# Patient Record
Sex: Male | Born: 1960 | Race: White | Hispanic: No | Smoking: Current every day smoker
Health system: Southern US, Community
[De-identification: ages and names within clinical notes are randomized; demographics above are authoritative.]

## PROBLEM LIST (undated history)

## (undated) DIAGNOSIS — I471 Supraventricular tachycardia, unspecified: Secondary | ICD-10-CM

## (undated) DIAGNOSIS — G4730 Sleep apnea, unspecified: Secondary | ICD-10-CM

## (undated) DIAGNOSIS — I10 Essential (primary) hypertension: Secondary | ICD-10-CM

## (undated) DIAGNOSIS — C4490 Unspecified malignant neoplasm of skin, unspecified: Secondary | ICD-10-CM

## (undated) DIAGNOSIS — Z9981 Dependence on supplemental oxygen: Secondary | ICD-10-CM

## (undated) DIAGNOSIS — E7800 Pure hypercholesterolemia, unspecified: Secondary | ICD-10-CM

## (undated) DIAGNOSIS — K851 Biliary acute pancreatitis without necrosis or infection: Secondary | ICD-10-CM

## (undated) DIAGNOSIS — B1920 Unspecified viral hepatitis C without hepatic coma: Secondary | ICD-10-CM

## (undated) DIAGNOSIS — K219 Gastro-esophageal reflux disease without esophagitis: Secondary | ICD-10-CM

## (undated) DIAGNOSIS — Z9889 Other specified postprocedural states: Secondary | ICD-10-CM

## (undated) DIAGNOSIS — J449 Chronic obstructive pulmonary disease, unspecified: Secondary | ICD-10-CM

## (undated) HISTORY — DX: Other specified postprocedural states: Z98.890

## (undated) HISTORY — DX: Gastro-esophageal reflux disease without esophagitis: K21.9

## (undated) HISTORY — PX: GALLBLADDER SURGERY: SHX652

## (undated) HISTORY — PX: CHOLECYSTECTOMY: SHX55

## (undated) HISTORY — DX: Unspecified malignant neoplasm of skin, unspecified: C44.90

## (undated) HISTORY — PX: OTHER SURGICAL HISTORY: SHX169

## (undated) HISTORY — DX: Unspecified viral hepatitis C without hepatic coma: B19.20

---

## 1990-09-25 DIAGNOSIS — C4490 Unspecified malignant neoplasm of skin, unspecified: Secondary | ICD-10-CM

## 1990-09-25 HISTORY — DX: Unspecified malignant neoplasm of skin, unspecified: C44.90

## 2005-06-20 ENCOUNTER — Ambulatory Visit: Payer: Self-pay | Admitting: Cardiology

## 2010-12-12 ENCOUNTER — Encounter: Payer: Self-pay | Admitting: Internal Medicine

## 2010-12-12 ENCOUNTER — Other Ambulatory Visit: Payer: Self-pay | Admitting: Gastroenterology

## 2010-12-12 ENCOUNTER — Encounter: Payer: Self-pay | Admitting: Gastroenterology

## 2010-12-12 ENCOUNTER — Ambulatory Visit (INDEPENDENT_AMBULATORY_CARE_PROVIDER_SITE_OTHER): Payer: Self-pay | Admitting: Gastroenterology

## 2010-12-12 ENCOUNTER — Other Ambulatory Visit: Payer: Self-pay | Admitting: Internal Medicine

## 2010-12-12 DIAGNOSIS — B182 Chronic viral hepatitis C: Secondary | ICD-10-CM | POA: Insufficient documentation

## 2010-12-13 LAB — PROTIME-INR: INR: 1.04 (ref <1.50)

## 2010-12-14 LAB — HEPATITIS C RNA QUANTITATIVE: HCV Quantitative: 1260000 IU/mL — ABNORMAL HIGH (ref <43)

## 2010-12-19 ENCOUNTER — Ambulatory Visit (HOSPITAL_COMMUNITY): Payer: Self-pay

## 2010-12-20 NOTE — Progress Notes (Signed)
Referral faxed to the [facility].Marland KitchenMarland Kitchen

## 2010-12-20 NOTE — Telephone Encounter (Signed)
Encounter created in error

## 2010-12-22 NOTE — Assessment & Plan Note (Signed)
Summary: abnormal labs   Vital Signs:  Patient profile:   50 year old male Height:      65 inches Weight:      235 pounds BMI:     39.25 Temp:     98.9 degrees F oral Pulse rate:   72 / minute BP sitting:   110 / 72  (left arm)  Vitals Entered By: Carolan Clines LPN (December 12, 2010 10:55 AM)  Visit Type:  Initial Consult Referring Provider:  Health Dept Primary Care Provider:  Health Dept   History of Present Illness: Mr. Dhanani presents today at the request of the Health Dept secondary to elevated transaminases. Denies abdominal pain, N/V. No loss of appetite. No pruritis or jaundice. Hx of tattoos in remote past. Denies IV drug use. No blood transfusions. Hx of multiple sexual partners in the 84s. +ETOH abuse a few years ago. Denies drinking now.  No [place] of abdomen. Labs listed below:   AST:82 ALT: 148. AP 20, Tbili nl, other LFTs nl. Albumin 4.6, Plt: 129. Hep B antigen negative, Heb B antibody negative, Hep a antibody Neg, Hep C reactive.   Current Medications (verified): 1)  None  Allergies (verified): No Known Drug Allergies  Past History:  Past Medical History: Mild reflux  Past Surgical History: Jaw broken Cholecystectomy Several lipoma removals  Family History: No FH of Colon Cancer: Mother:deceased, emphysema age 42 Father:deceased, DM, CVAs  Social History: Occupation: Theatre manager  Divorced, 3 children, sharing house with ex-girlfriend  Patient currently smokes. 1/2 to 1ppd Alcohol Use - not currently; does have hx of binge drinking on weekends 4-5 years ago.  hx of cocaine use 4-5 months ago Smoking Status:  current  Review of Systems General:  Denies fever, chills, and anorexia. Eyes:  Denies blurring, irritation, and discharge. ENT:  Denies sore throat, hoarseness, and difficulty swallowing. CV:  Denies chest pains and syncope. Resp:  Denies dyspnea at rest and wheezing. GI:  See HPI. GU:  Denies urinary burning, blood in urine, and urinary  frequency. MS:  Denies joint pain / LOM, joint swelling, and joint stiffness. Derm:  Denies rash, itching, and dry skin. Neuro:  Denies weakness and syncope. Psych:  Denies depression and anxiety. Endo:  Denies cold intolerance and heat intolerance.  Physical Exam  General:  Well developed, well nourished, no acute distress.obese.   Head:  Normocephalic and atraumatic. Eyes:  sclera without icterus Mouth:  No deformity or lesions, dentition normal. Lungs:  Clear throughout to auscultation. Heart:  Regular rate and rhythm; no murmurs, rubs,  or bruits. Abdomen:  large AP diameter. Obese. +BS, soft, non-tender, non-distended. Large well-healed open cholecystectomy scar. No palpable HSM.  Msk:  Symmetrical with no gross deformities. Normal posture. Extremities:  No clubbing, cyanosis, edema or deformities noted. Neurologic:  Alert and  oriented x4;  grossly normal neurologically. Skin:  Intact without significant lesions or rashes. Psych:  Alert and cooperative. Normal mood and affect.   Impression & Recommendations:  Problem # 1:  TRANSAMINASES, SERUM, ELEVATED (ICD-790.4) 49-yaer-old Caucasian male with incidental findings of elevated transaminases on routine labwork. Completely asymptomatic. Viral markers completed with +Hep C antibody. Likely culprit of elevated LFTs. Pt is obese and may have component of fatty liver.   [place] of abdomen Hep C quant PCR PT/INR Low-fat diet Avoid ETOH Further rec's to follow  Orders: T-PT (Prothrombin Time) (84696) T-Hepatitis C RNA Quant PCR ([id number]) Consultation Level III (40102)   Orders Added: 1)  T-PT (Prothrombin Time) [  22000] 2)  T-Hepatitis C RNA Quant PCR [[id number]] 3)  Consultation Level III [09811]

## 2010-12-22 NOTE — Letter (Signed)
Summary: Internal Correspondence  Internal Correspondence   Imported By: Hendricks Limes LPN 16/06/9603 54:09:81  _____________________________________________________________________  External Attachment:    Type:   Image     Comment:   External Document

## 2010-12-22 NOTE — Letter (Signed)
Summary: ABD U/S ORDER  ABD U/S ORDER   Imported By: Ave Filter 12/12/2010 12:11:57  _____________________________________________________________________  External Attachment:    Type:   Image     Comment:   External Document

## 2010-12-23 ENCOUNTER — Ambulatory Visit (HOSPITAL_COMMUNITY)
Admission: RE | Admit: 2010-12-23 | Discharge: 2010-12-23 | Disposition: A | Discharge status: Home / Self Care | Payer: Self-pay | Source: Ambulatory Visit | Attending: Internal Medicine | Admitting: Internal Medicine

## 2010-12-23 DIAGNOSIS — R748 Abnormal levels of other serum enzymes: Secondary | ICD-10-CM | POA: Insufficient documentation

## 2010-12-23 DIAGNOSIS — B182 Chronic viral hepatitis C: Secondary | ICD-10-CM | POA: Insufficient documentation

## 2011-02-22 ENCOUNTER — Encounter: Payer: Self-pay | Admitting: Gastroenterology

## 2011-02-23 ENCOUNTER — Ambulatory Visit (INDEPENDENT_AMBULATORY_CARE_PROVIDER_SITE_OTHER): Payer: Self-pay | Admitting: Gastroenterology

## 2011-02-23 VITALS — BP 122/90 | HR 66 | Temp 98.1°F | Ht 64.0 in | Wt 235.0 lb

## 2011-02-23 DIAGNOSIS — B182 Chronic viral hepatitis C: Secondary | ICD-10-CM

## 2011-02-23 LAB — CBC WITH DIFFERENTIAL/PLATELET
Eosinophils Absolute: 0.1 10*3/uL (ref 0.0–0.7)
Eosinophils Relative: 1 % (ref 0–5)
HCT: 47.5 % (ref 39.0–52.0)
Lymphs Abs: 1.6 10*3/uL (ref 0.7–4.0)
MCH: 34.4 pg — ABNORMAL HIGH (ref 26.0–34.0)
MCV: 94.4 fL (ref 78.0–100.0)
Monocytes Absolute: 0.5 10*3/uL (ref 0.1–1.0)
Platelets: 143 10*3/uL — ABNORMAL LOW (ref 150–400)
RBC: 5.03 MIL/uL (ref 4.22–5.81)

## 2011-02-23 LAB — PROTIME-INR
INR: 1.03 (ref <1.50)
Prothrombin Time: 13.7 seconds (ref 11.6–15.2)

## 2011-02-23 LAB — HEPATITIS B SURFACE ANTIGEN: Hepatitis B Surface Ag: NEGATIVE

## 2011-02-24 LAB — IBC PANEL
%SAT: 32 % (ref 20–55)
TIBC: 418 ug/dL (ref 215–435)
UIBC: 286 ug/dL

## 2011-02-24 LAB — COMPREHENSIVE METABOLIC PANEL
Albumin: 4.4 g/dL (ref 3.5–5.2)
BUN: 16 mg/dL (ref 6–23)
CO2: 22 mEq/L (ref 19–32)
Calcium: 9.4 mg/dL (ref 8.4–10.5)
Chloride: 104 mEq/L (ref 96–112)
Creat: 0.83 mg/dL (ref 0.40–1.50)
Glucose, Bld: 76 mg/dL (ref 70–99)

## 2011-02-28 LAB — HEPATITIS C GENOTYPE

## 2011-03-02 NOTE — Progress Notes (Signed)
NAME:  Thomas Ball, Thomas Ball  MR#:  [id number]      DATE:  02/23/2011  DOB:  [birth year]    cc: Primary Care Physician:  [facility] Department of [facility], [address] [place] [address], [place], [place] [address], Fax [phone] Referring Physician:  Gerrit Halls, NP, c/o Roetta Sessions, MD, [facility], [address], [place], [place] [address], Fax [phone]    REASON FOR REFERRAL:  Positive HCV RNA.    history:  The patient is a 50 year old gentleman who I have been asked to see in consultation by Ms. Sams regarding a positive hepatitis C RNA.  According to the patient, there is no history of symptomatic liver disease. [facility] sometime earlier this year, or perhaps late last  year for a physical examination. He was found to have abnormal liver tests, which led to a finding of a positive hepatitis C antibody. He was then referred to Dr. Kendell Bane, where a HCV RNA was done showing a  viral load of 1,260,000 international units/mL. He was not genotyped. There are no symptoms referable to his history of hepatitis C. There are no symptoms to suggest cryoglobulin mediated or decompensated  liver disease.  With respect to risk factors for liver disease, there was significant alcohol use on weekends for many years until he gradually cut down over several months to a few years stopping altogether in October 2011. There is also a history of smoking crack until December 2011, when he stopped altogether. He now attends a church group on a weekly basis for 2 hours, which constitutes counseling. There is no history of intravenous drug use. He has tattoos in the remote past. There is no history of transfusions or unsterile body piercing. There is no family history of liver disease. He has not been immunized against hepatitis A or B.   PAST MEDICAL HISTORY:  Otherwise unremarkable.   PAST SURGERY HISTORY:  Wired jaw for fracture,  cholecystectomy 1980s, lipoma removal.    Past psychiatric history:  Denies.    Current medications:  None.    Allergies:  Denies.   habits:  Smoking 1 pack of cigarettes per day. Alcohol as above.   FAMILY HISTORY:  As above.   SOCIAL HISTORY:  Married with 3 children, accompanied by his wife today. 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   REVIEW OF SYSTEMS:  All 10 systems reviewed today with the patient and they are negative other than which was mentioned above. CES-D was 1.   PHYSICAL EXAMINATION:   Constitutional:  Central obesity without significant bitemporal wasting. Vital signs: Height 64 inches, weight 235 pounds, blood pressure 122/90, pulse 66, temperature 98.1 Fahrenheit. Ears, nose,  mouth and throat:  Unremarkable oropharynx.  No thyromegaly or neck masses.  Chest:  Resonant to percussion.  Clear to auscultation.  Cardiovascular:  Heart sounds normal S1, S2 without murmurs or rubs.   There is no peripheral edema.  Abdominal:  Normal bowel sounds.  No masses or tenderness.  I could not appreciate a liver edge or spleen tip.  I could not appreciate any hernias.  Lymphatics:  No cervical or  inguinal lymphadenopathy.  Central Nervous System:  No asterixis or focal neurologic findings.  Dermatologic:  Anicteric without palmar  erythema or spider angiomata.  Eyes:  Anicteric sclerae.  Pupils are equal and reactive to light.   Laboratories:  Previous lab work from  a clinic note of 12/12/2010, the labs that must have been sent from [facility] indicate an AST of 82, ALT 148, ALP of 20, total bilirubin was normal, albumin 4.6, platelets 129,  hepatitis B surface antigen was negative. Hepatitis B antibody was negative, hepatitis A antibody presumably total hepatitis A antibody was negative, and the hepatitis C antibody was positive. The only labs  I obtained from Dr. Aura Fey office was an HCV RNA of  1,260,000 international units/mL or 6.10 log international units per mL.  Ultrasound done on 12/23/2010, at [facility] showed there was no focal liver masses. The spleen was normal in size. There is no mention of ascites.   Assessment:  The patient is a 50 year old gentleman with a history of genotype unknown HCV. Aside from central obesity, which is not a contraindication to treating him, I do not have any concerns about his  ability to talk to be treated or his ability to respond to therapy. He is now 5 months abstinent from drug use and over 6 months abstinent from alcohol use, and in counseling for this. Overall, I think he is a good candidate be treated, but he would benefit from weight loss.  In my discussion today with the patient and his wife, who accompanied him, we discussed the nature and natural history of hepatitis C. We discussed the need to genotype him. We discussed the role of biopsy  for genotype 1 patients. We discussed treatment with pegylated interferon and ribavirin and for all genotypes, and the addition of a protease inhibitor for genotype 1. I discussed the treatment protocol  and follow up in our clinic. I reviewed the specific system, constitutional, psychiatric side effects of therapy. I reviewed the response rates based on genotype. I also discussed the risk of  contagion.  I then discussed the possibility of participating in clinical trials. I have explained that participation is completely voluntary but requires compliance. I have explained that he may be randomized to  medications that are considered experimental. I have explained that he may receive medications for which we do not know the complete side effect profile or true efficacy. I have also explained that we do not  have an obligation to offer him trials because these are not considered standard of care. He was interested in hearing about any trials should he be eligible.   PLAN:  1. Standard labs including  genotyping. 2. Test for hepatitis A and B immunity. 3. Will not do an IL-28 until he has a genotype. 4. If genotype 1, will send for biopsy and then follow up thereafter. 5. If genotype non-1, will bring for follow up. 6. I will send his information to our research coordinator to see if he is eligible for any trials. 7. Literature on hepatitis C given.            Brooke Dare, MD    ADDENDUM: Genotype 3a  Needs hepatitis A and B vaccination.  403 .[id number]  D:  Thu May 31 13:23:23 2012 ; T:  Thu May 31 15:23:05 2012  Job #:  [id number]

## 2011-04-20 ENCOUNTER — Ambulatory Visit (INDEPENDENT_AMBULATORY_CARE_PROVIDER_SITE_OTHER): Payer: Self-pay | Admitting: Gastroenterology

## 2011-04-20 VITALS — BP 118/82 | HR 76 | Temp 97.7°F | Ht 64.0 in | Wt 241.0 lb

## 2011-04-20 DIAGNOSIS — B182 Chronic viral hepatitis C: Secondary | ICD-10-CM

## 2011-04-27 NOTE — Progress Notes (Signed)
NAME:  Thomas Ball, Thomas Ball  MR#:  [id number]      DATE:  04/20/2011  DOB:  05-08-1961    cc: Primary Care Physician:  [facility] Department of [facility], [address] [place] [address], [place], [place] [address], Fax [phone] Referring Physician:  Gerrit Halls, NP c/o Roetta Sessions, MD, [facility], [address], [place], [place] [address], Fax [phone]    REASON FOR VISIT:  Followup of genotype 3a hepatitis C.    history:  The patient returns today unaccompanied. Since last being seen on 02/23/2011, we characterized his virus by genotyping him. He has gained 6 pounds since last time he was seen. There are no symptoms to  suggest decompensated or cryoglobulin mediated disease nor are there symptoms referable directly to his hepatitis C.  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 I suggested to him that perhaps this would lead to  overeating considering he is not eating at regular intervals. I also tried to elicit a history of how much carbohydrate he was taking in his diet.   PAST MEDICAL HISTORY:  No diabetes, dyslipidemia, hypertension, or coronary artery disease.   CURRENT MEDICATIONS:  None.    Allergies:  Denies.    Habits:  Smoking, approximately half a pack to 1 pack of cigarettes per day. Alcohol denies interval consumption.   REVIEW OF SYSTEMS:  All 10 systems reviewed today with the patient and they are negative other than which is mentioned above. His CES-D was zero.   PHYSICAL EXAMINATION:  Constitutional: Well appearing, but he does have central obesity. Vital signs: Height 64 inches, weight 241 pounds, blood pressure 118/82, pulse 76, temperature 97.7 Fahrenheit.   Laboratories:  Relevant labs from 02/23/2011, his platelet count was 143. INR was 1.03, AST 78 ALT 118, ALP 19, total bilirubin 1.1 albumin 4.4.  Hepatitis B surface antigen and antibody  were negative, as was total A antibody. ANA was negative. Genotype 3a.   ASSESSMENT:  The patient is a 50 year old gentleman with history of genotype 3a hepatitis C. I do not see any contraindication to treating him other than his obesity. I know his ALP has been low in the past and his  ceruloplasmin should be done at the time of his next lab draws to be careful. His BMI of 41.4 today does not bode well for a response to treatment, and I think that would be worth increasing his dose of  ribavirin above the traditionally 800 mg daily dose.  In my discussion today with the patient, we discussed the results of the lab testing including genotyping and its significance. We discussed the importance of at least 6-10 pounds of weight loss as he  goes through the process of gearing up to be treated for his hepatitis C, which he remains interested in. We discussed treatment duration,  our protocol for treatment in our clinic for genotype 3 patients. We discussed the response rates. He remains interested in being treated.   PLAN:  1. Will need to do a ceruloplasmin at his subsequent visit. 2. He is hepatitis A and B naive and will need to be vaccinated through the [facility]. 3. I have given him a copy of the [place] Access to Care application to complete and return. Should he qualify for medications, the medications will be shipped to his house at which point he will contact us for  the first appointment for teaching, at which time we can draw his ceruloplasmin and viral load. 4. Follow up will be dependent on when all the paperwork is done, which could take at least a month.            Brooke Dare, MD   [phone]  D:  Thu Jul 26 17:40:30 2012 ; T:  Thu Jul 26 20:11:39 2012  Job #:  [id number]

## 2011-05-25 ENCOUNTER — Other Ambulatory Visit: Payer: Self-pay | Admitting: Gastroenterology

## 2011-05-25 ENCOUNTER — Ambulatory Visit (INDEPENDENT_AMBULATORY_CARE_PROVIDER_SITE_OTHER): Payer: Self-pay | Admitting: Gastroenterology

## 2011-05-25 VITALS — BP 142/91 | HR 74 | Temp 98.1°F | Ht 64.0 in | Wt 241.0 lb

## 2011-05-25 DIAGNOSIS — B182 Chronic viral hepatitis C: Secondary | ICD-10-CM

## 2011-05-30 LAB — HCV RNA QUANT RFLX ULTRA OR GENOTYP
HCV Quantitative Log: 6.14 {Log} — ABNORMAL HIGH (ref <1.63)
HCV Quantitative: 1370000 IU/mL — ABNORMAL HIGH (ref <43)

## 2011-06-08 ENCOUNTER — Ambulatory Visit (INDEPENDENT_AMBULATORY_CARE_PROVIDER_SITE_OTHER): Payer: Self-pay | Admitting: Gastroenterology

## 2011-06-08 VITALS — BP 140/83 | HR 76 | Temp 97.7°F | Ht 64.0 in | Wt 244.0 lb

## 2011-06-08 DIAGNOSIS — B182 Chronic viral hepatitis C: Secondary | ICD-10-CM

## 2011-06-08 LAB — CBC WITH DIFFERENTIAL/PLATELET
Eosinophils Relative: 3 % (ref 0–5)
Hemoglobin: 14.9 g/dL (ref 13.0–17.0)
Lymphocytes Relative: 42 % (ref 12–46)
Lymphs Abs: 1.1 10*3/uL (ref 0.7–4.0)
MCH: 33.6 pg (ref 26.0–34.0)
MCV: 98.2 fL (ref 78.0–100.0)
Monocytes Relative: 14 % — ABNORMAL HIGH (ref 3–12)
Neutrophils Relative %: 41 % — ABNORMAL LOW (ref 43–77)
Platelets: 114 10*3/uL — ABNORMAL LOW (ref 150–400)
RBC: 4.43 MIL/uL (ref 4.22–5.81)
WBC: 2.7 10*3/uL — ABNORMAL LOW (ref 4.0–10.5)

## 2011-06-08 NOTE — Progress Notes (Signed)
   NAME:  Thomas Ball, Thomas Ball  MR#:  [id number]      DATE:  05/25/2011  DOB:  1961/03/17    cc: Primary Care Physician:  [facility] Department of [facility], [address] [place] [address], [place], [place] [address], Fax [phone] Referring Physician:  Gerrit Halls, NP, c/o Roetta Sessions, MD, [facility], [address] [place], [place] [address], Fax [phone]    REASON FOR VISIT:  Genotype 3A hepatitis C for teaching.    History:  The patient returns today with his significant other. He has no symptoms referable to his history of hepatitis C nor are there symptoms to suggest cryoglobulin mediated or decompensated liver  disease. He has his medications in hand, and seeks teaching on the administration thereof.   PAST MEDICAL HISTORY:  No interval change.   CURRENT MEDICATIONS:  None at this time, but will start on Pegasys 180 mcg subcu weekly starting tomorrow and ribavirin 400 mg p.o. b.i.d. starting tomorrow.  I was unable to give a higher dose as he is on [place] Access To Care and this is the dose of the ribavirin they will dispense.   Allergies:  Denies.   HABITS:  Smoking, approximately half-pack to 1 pack cigarettes per day. Alcohol denies interval consumption.   REVIEW OF SYSTEMS:  All 10 systems reviewed today with the patient and they are negative other than which was mentioned above. CES-D was zero.   PHYSICAL EXAMINATION:   Constitutional:  Otherwise well. Vital signs: Height 64 inches, weight 241 pounds, blood pressure 142/91, pulse 74, temperature 98.1 Fahrenheit.   ASSESSMENT:  The patient is a 50 year old gentleman with history of genotype 3A hepatitis C. He is due to commence on treatment tomorrow, 05/26/2011.  Today, I demonstrated how to do the injection of Pegasys with the prefilled syringes. I also instructed him on how to use the ribavirin.  His significant other has the experience of using insulin injections and will assist him with all this.   PLAN:    1. I forgot to do the ceruloplasmin today but did drawn HCV RNA as his baseline. 2. He is hepatitis A and B naive and will need to be vaccinated through the [facility]. 3. His start date for treatment will be 05/26/2011. 4. Will return in 2 weeks' time for his first on treatment visit for CBC and I will draw the ceruloplasmin at that time.            Thomas Dare, MD   ADDENDUM  Pretreatment viral load 1370000 IU/mL.    403 .[id number]  D:  Thu Aug 30 18:42:58 2012 ; T:  Thu Aug 30 21:50:59 2012  Job #:  [id number]

## 2011-06-09 LAB — CERULOPLASMIN: Ceruloplasmin: 25 mg/dL (ref 20–60)

## 2011-06-22 ENCOUNTER — Ambulatory Visit (INDEPENDENT_AMBULATORY_CARE_PROVIDER_SITE_OTHER): Payer: Self-pay | Admitting: Gastroenterology

## 2011-06-22 DIAGNOSIS — B182 Chronic viral hepatitis C: Secondary | ICD-10-CM

## 2011-06-22 LAB — HEPATIC FUNCTION PANEL
Albumin: 3.9 g/dL (ref 3.5–5.2)
Bilirubin, Direct: 0.4 mg/dL — ABNORMAL HIGH (ref 0.0–0.3)
Total Bilirubin: 1.6 mg/dL — ABNORMAL HIGH (ref 0.3–1.2)

## 2011-06-22 LAB — TSH: TSH: 0.796 u[IU]/mL (ref 0.350–4.500)

## 2011-06-22 NOTE — Progress Notes (Signed)
   Thomas Ball    MR#:  [id number]      DATE:  06/08/2011  DOB:  05/27/1961    cc: Primary Care Physician:  [facility] Department of [facility], [address] [place] [address], [place], [place] [address], Fax [phone] Referring Physician:  Gerrit Halls, NP c/o [name], MD, [facility], [address] [place], [place] [address], Fax [phone]    REASON FOR VISIT:  Follow up of genotype 3a HCV, week 2 of therapy.    HISTORY:  The patient returns today unaccompanied. His start date for ribavirin for his 3A hepatitis C was on 05/25/2011. Today will be his third injection in switching injections from Thursday to Friday. He is  having some irritability in that he has had some arguments with his girlfriend, but thinks that this is manageable and does not want pharmacotherapy. He had a bout of bronchitis for the first week of  treatment for which he went to the [facility] and was given a course of antibiotics, the name of which he cannot recall, and he did not bring them with him today. He feels sick within  24 hours of taking the injections and that lasts for another 24 hours before it resolves. He is still able work. There is no skin rash.   PAST MEDICAL HISTORY:  No interval change.   CURRENT MEDICATIONS:  Ribavirin 400 mg p.o. b.i.d., Pegasys 180 mcg subcu q. weekly.    Allergies:  Denies.   HABITS:  Smoking, smokes 1/2 pack to 1 pack of cigarettes per day. Alcohol, denies interval consumption.   REVIEW OF SYSTEMS:  All 10 systems reviewed today with the patient and they are negative other than which was mentioned above. His CES-D was 2.   PHYSICAL EXAMINATION:   Constitutional:  Central obesity, but otherwise well. Vital signs: Height 64 inches, weight 244 pounds up 3 pounds from previously. Blood pressure 140/83, pulse of 76 with temperature of 97.7 Fahrenheit.  Chest:  Resonant to percussion and auscultation.   ASSESSMENT:  The patient is a  50 year old gentleman with history of genotype 3A hepatitis C. He is currently at week 2 of treatment with a start date of 05/25/2011.  In my discussion today with the patient, we discussed his progress to date. I reviewed the side effects with him. Because he thinks he missed maybe a dose of ribavirin at least once in the evening, I have  given him a Pegasys kit with the dosing pill box. We discussed getting a CBC today, and the importance of returning in 2 weeks' time for a week 4 HCV RNA. He declined any pharmacotherapy for his irritability.   plan:  1. CBC today. 2. Will draw ceruloplasmin. 3. He is hepatitis A and B naive, and will need to be a vaccinated at [facility]. 4. I have given him a Pegasys dosing kit. 5. He is to return in 2 weeks' time.            Thomas Dare, MD   ADDENDUM: Ceruloplasmin normal.  403 .20947  D:  Thu Sep 13 11:44:06 2012 ; T:  Thu Sep 13 13:14:12 2012  Job #:  [id number]

## 2011-06-23 LAB — CBC WITH DIFFERENTIAL/PLATELET
Basophils Relative: 0 % (ref 0–1)
HCT: 42.7 % (ref 39.0–52.0)
Hemoglobin: 14.5 g/dL (ref 13.0–17.0)
Lymphocytes Relative: 37 % (ref 12–46)
Lymphs Abs: 1 10*3/uL (ref 0.7–4.0)
Monocytes Absolute: 0.3 10*3/uL (ref 0.1–1.0)
Monocytes Relative: 10 % (ref 3–12)
Neutro Abs: 1.4 10*3/uL — ABNORMAL LOW (ref 1.7–7.7)
Neutrophils Relative %: 51 % (ref 43–77)
RBC: 4.43 MIL/uL (ref 4.22–5.81)
WBC: 2.8 10*3/uL — ABNORMAL LOW (ref 4.0–10.5)

## 2011-06-26 LAB — HEPATITIS C RNA QUANTITATIVE: HCV Quantitative Log: <1.63 {Log} (ref <1.63)

## 2011-06-29 NOTE — Progress Notes (Addendum)
   Thomas Ball, Thomas Ball    MR#:  [id number]      DATE:  06/22/2011  DOB:  09-22-1961    cc:  Primary Care Physician: [facility] Department of [facility], [address] [place] [address], [place], [place] [address], Fax [phone]   Referring Physician: Gerrit Halls, NP c/o [name], MD, [facility], [address] [place], [place] [address], Fax [phone]    REASON FOR VISIT:  Followup genotype 3A HCV week 4 of therapy.    HISTORY:  The patient returns today unaccompanied. His start date for ribavirin and pegylated interferon for his genotype 3A hepatitis C was on 05/25/2011. He will take his fifth injection tomorrow. The  irritability is better. He complains of fatigue. There is no significant skin rash. He reports that he had a recent respiratory tract infection with a cough and some congestion.   PAST MEDICAL HISTORY:  No interval change.   CURRENT MEDICATIONS:  Ribavirin 400 mg p.o. b.i.d., Pegasys 180 mcg subcu weekly.    Allergies:  Denies.   HABITS:  Smoking, half-pack to 1 pack cigarettes per day. Alcohol, denies interval consumption.   REVIEW OF SYSTEMS:  He complains of a stye over his right eyelid.  All 10 systems reviewed today with the patient and they are negative other than which was mentioned above.  His CES-d was 4.   PHYSICAL EXAMINATION:   Constitutional:  Central obesity, otherwise well. Vital signs: Height 64 inches, weight 241 pounds, down 3 pounds from previously. Blood pressure 122/78, pulse 94, temperature 98.6.   ASSESSMENT:  The patient is a 50 year old gentleman with history of genotype 3A hepatitis C. He is currently week 4 of treatment with the start date 05/25/2011. Today we will check his HCV RNA to determine his week 4  response.  In my discussion today with the patient, I have discussed the importance of today's lab work. I have also reinforced the need to stop smoking.   I have explained to the patient that they stye over his right eyelid  has nothing to do with his treatment of hepatitis C or his hepatitis C  itself, and he should be careful not to manipulate it to prevent infection.   plan:  1. CBC today. 2. Hepatitis A and B naive. He will need to be vaccinated through [facility]. 3. To return in 2 weeks' time for his week 6 visit.            Brooke Dare, MD   ADDENDUM HCV RNA is detected but not quantifiable   403 .20947  D:  Thu Sep 27 13:34:07 2012 ; T:  Thu Sep 27 14:02:51 2012  Job #:  [id number]

## 2011-07-06 ENCOUNTER — Other Ambulatory Visit: Payer: Self-pay | Admitting: Gastroenterology

## 2011-07-06 ENCOUNTER — Ambulatory Visit (INDEPENDENT_AMBULATORY_CARE_PROVIDER_SITE_OTHER): Payer: Self-pay | Admitting: Gastroenterology

## 2011-07-06 DIAGNOSIS — B182 Chronic viral hepatitis C: Secondary | ICD-10-CM

## 2011-07-07 LAB — CBC WITH DIFFERENTIAL/PLATELET
Eosinophils Relative: 1 % (ref 0–5)
HCT: 40 % (ref 39.0–52.0)
Hemoglobin: 13.1 g/dL (ref 13.0–17.0)
Lymphocytes Relative: 42 % (ref 12–46)
Lymphs Abs: 1.3 10*3/uL (ref 0.7–4.0)
MCV: 100.5 fL — ABNORMAL HIGH (ref 78.0–100.0)
Monocytes Relative: 10 % (ref 3–12)
Platelets: 114 10*3/uL — ABNORMAL LOW (ref 150–400)
RBC: 3.98 MIL/uL — ABNORMAL LOW (ref 4.22–5.81)
WBC: 3 10*3/uL — ABNORMAL LOW (ref 4.0–10.5)

## 2011-07-14 NOTE — Progress Notes (Signed)
   NAME:  Thomas Ball, Thomas Ball  MR#:  [id number]      DATE:  07/06/2011  DOB:  Aug 14, 1961    cc:  Primary Care Physician: [facility] Department of [facility], [address] [place] [address], [place], [place] [address], Fax [phone]   Referring Physician: Gerrit Halls, NP c/o [name], MD, [facility], [address], [place], [place] [address], Fax [phone]     Reason for visit:  Followup genotype 3a HCV on week 6 of therapy.    History:  The patient returns today comes unaccompanied. His start date for ribavirin and pegylated interferon for genotype 3a hepatitis C was on  05/25/2011. He will take his seventh injection tomorrow. He is tolerating therapy well, but does complain of some fatigue.   PAST MEDICAL HISTORY:  No interval change.   CURRENT MEDICATIONS:  Ribavirin 400 mg p.o. b.i.d., Pegasys 180 mcg subcu weekly.   ALLERGIES:  Denies.   HABITS:  Smoking half pack to 1 pack of cigarettes per day. Alcohol denies interval consumption.   REVIEW OF SYSTEMS:  All 10 systems reviewed today with the patient and they are negative other which is mentioned above. CES-D was 6.   PHYSICAL EXAMINATION:   Constitutional:  Central obesity. Otherwise appeared well. Vital signs: Height 64 inches, weight 246 pounds up 5 pounds from  previously. Blood pressure 137/90, pulse 74, temperature 97.8 Fahrenheit.   ASSESSMENT:  The patient is a 50 year old gentleman with history of genotype 3a hepatitis C. He is currently completing week 6 of therapy with start date on 05/25/2011. His HCV RNA at week 4 was detectable but not  quantifiable. This suggests to me that he should be continued on therapy at least for 6 months but perhaps one can consider extending therapy to perhaps 36 weeks since the last negative HCV RNA. To that  end, I will check an HCV RNA today.  Today, I discussed the results of lab testing with the patient and explained the implications of his positive but unquantifiable HCV  RNA.  We discussed the possibility of him needing to continue on therapy a little bit longer than the minimal of 6 months.   PLAN:  1. CBC today. 2. Will check an HCV RNA today for his week 6 RNA. 3. He is to follow up in 2 weeks' time for his week 8 visit. 4. He is hepatitis A and B naive and will need to be vaccinated to [facility]. 5. It may be that through the indigent care program with [facility] they will not authorize more than 6 months of therapy, however which may not have a material impact on his risk of relapse.            Brooke Dare, MD   ADDENDUM HCV RNA undetectable at week 6.  403 .[id number]  D:  Thu Oct 11 16:29:01 2012 ; T:  Thu Oct 11 16:56:13 2012  Job #:  [id number]

## 2011-07-20 ENCOUNTER — Ambulatory Visit (INDEPENDENT_AMBULATORY_CARE_PROVIDER_SITE_OTHER): Payer: Self-pay | Admitting: Gastroenterology

## 2011-07-20 DIAGNOSIS — B182 Chronic viral hepatitis C: Secondary | ICD-10-CM

## 2011-07-20 LAB — HEPATIC FUNCTION PANEL
AST: 43 U/L — ABNORMAL HIGH (ref 0–37)
Bilirubin, Direct: 0.3 mg/dL (ref 0.0–0.3)
Indirect Bilirubin: 0.8 mg/dL (ref 0.0–0.9)
Total Bilirubin: 1.1 mg/dL (ref 0.3–1.2)

## 2011-07-20 LAB — CBC WITH DIFFERENTIAL/PLATELET
Eosinophils Absolute: 0 10*3/uL (ref 0.0–0.7)
Eosinophils Relative: 1 % (ref 0–5)
HCT: 39 % (ref 39.0–52.0)
Lymphocytes Relative: 40 % (ref 12–46)
Lymphs Abs: 1.1 10*3/uL (ref 0.7–4.0)
MCH: 33.3 pg (ref 26.0–34.0)
MCV: 104 fL — ABNORMAL HIGH (ref 78.0–100.0)
Monocytes Absolute: 0.3 10*3/uL (ref 0.1–1.0)
Monocytes Relative: 12 % (ref 3–12)
Platelets: 127 10*3/uL — ABNORMAL LOW (ref 150–400)
RBC: 3.75 MIL/uL — ABNORMAL LOW (ref 4.22–5.81)
WBC: 2.7 10*3/uL — ABNORMAL LOW (ref 4.0–10.5)

## 2011-07-27 NOTE — Progress Notes (Signed)
   NAME:  Thomas Ball, Thomas Ball  MR#:  [id number]      DATE:  07/20/2011  DOB:  1960-10-03    cc: Primary Care Physician:  [facility] Department [facility], [address] [place] [address] [place], [place] [address], Fax [phone] Referring Physician:  Gerrit Halls, NP c/o [name], MD. [facility], [address], [place], [place] [address], Fax [phone]    Reason for visit:  Followup genotype 3A HCV on week 8 of therapy.    History:  Patient returns today unaccompanied. His start date for pegylated interferon and ribavirin for genotype 3A HCV was on 05/25/2011,  putting him at approximately week 8 of treatment. He is tolerating therapy well, other than some fatigue.   Past medical history:  No interval change.   MEDICATIONS:  Ribavirin 400 mg p.o. b.i.d., Pegasys 180 mcg subcu weekly.    Allergies:  Denies.   HABITS:  Smoking half pack of cigarettes per day. No interval alcohol consumption.   SYSTEM REVIEW:   OF SYSTEMS:  All 10 systems reviewed today with the patient and they are negative other than which is mentioned above. CES-D was 4.   PHYSICAL EXAMINATION:   Constitutional:  Central obesity but otherwise well. Vital signs: Height 64 inches, weight 246 pounds unchanged from previously, blood pressure 130/91, pulse of 81, temperature 98.1 Fahrenheit.    Laboratories:  His week 6 HCV RNA was undetectable.   ASSESSMENT:  The patient is a 50 year old gentleman with a history genotype 3A hepatitis C. He has currently completed week 8 of therapy, which started on 05/25/2011. His HCV RNA at week 4 was detectable but not  quantifiable and his week 6 HCV RNA was undetectable. This suggests that I should extend his therapy to perhaps 36 from a planned 24 weeks because of the slow virological response and the lack of a complete  rapid virological response at week 4.  In my discussion today with the patient, we discussed the results of previous lab testing and its implications.  We discussed possibly extending therapy to 48 weeks. I told him this was contingent on his  ability to tolerate therapy, as well as approval from [place] who is supplying medications at no cost to the patient.  I also reassured him that if we are unable to do this, other drugs in the future will become available that would be much more powerful than  the current available therapies and he perhaps will be eligible for those treatments.   plan:  1. CBC today. 2. He is hepatitis A and B naive and needs to be vaccinated through his [facility]. 3. He is to return in 4 weeks' time for his week 12 visit.            Brooke Dare, MD   ADDENDUM: I am copying this note to [facility] to let them know that I intend to try and extend therapy to 48 weeks, and I had originally asked for 48 weeks of therapy on the initial prescription form.  403 .[id number]  D:  Thu Oct 25 15:57:54 2012 ; T:  Thu Oct 25 16:50:19 2012  Job #:  [id number]

## 2011-08-10 ENCOUNTER — Ambulatory Visit: Payer: Self-pay | Admitting: Gastroenterology

## 2011-08-24 ENCOUNTER — Ambulatory Visit (INDEPENDENT_AMBULATORY_CARE_PROVIDER_SITE_OTHER): Payer: Self-pay | Admitting: Gastroenterology

## 2011-08-24 DIAGNOSIS — B182 Chronic viral hepatitis C: Secondary | ICD-10-CM

## 2011-08-31 NOTE — Progress Notes (Signed)
   NAME:  Thomas Ball, Thomas Ball  MR#:  [id number]      DATE:  08/24/2011  DOB:  27-Jan-1961    cc: Primary Care Physician:  [facility] Department of [facility], [address] [place] [address], [place], [place] [address], Fax [phone] Referring Physician:  Gerrit Halls, NP c/o [name], MD, [facility], [address], [place], [place] [address], Fax [phone]    REASON FOR VISIT:  Followup genotype 3a HCV, week 13 of therapy.   History:  Patient returns today unaccompanied. Her start date for pegylated interferon and ribavirin for genotype 3 HCV was on 05/25/2011. He is  taking his fourteenth injection of Pegasys tomorrow. He is tolerating therapy well without any complaints.   PAST MEDICAL HISTORY:  No interval change.   CURRENT MEDICATIONS:  Ribavirin 400 mg p.o. b.i.d., Pegasys 180 mcg subcu weekly.   ALLERGIES:  Denies.   HABITS:  Still smoking half pack of cigarettes per day. Alcohol, no interval consumption.   REVIEW OF SYSTEMS:  All 10 systems reviewed today with the patient and they are negative other than which is mentioned above. His CES-D was 2.   PHYSICAL EXAMINATION:   Constitutional:  Central obesity but otherwise well. Vital signs: Height 64 inches, weight 243 pounds down 3 pounds from previously. Blood pressure 137/96, pulse 76, temperature 98 Fahrenheit.   ASSESSMENT:  The patient is a 50 year old gentleman with a history of genotype 3a HCV. He is currently completing week 13 of therapy with start on 05/25/2011, and takes his fourteenth injection of Pegasys tomorrow.  His HCV RNA at week 4 was detectable but not quantifiable. His week 6 HCV was undetectable. I have faxed [facility] the possibility of extending his access to care from 24 to at least 42 weeks because of a slow viral response, in addition to the fact that he has central obesity. I have not received any news to the contrary suggesting I can continue with treatment and they will cover his  medications.  In my discussion today with the patient, we discussed the implications of his viral kinetics and extending therapy to at least week 42, possibly 48. He states he is willing to continue, as he is not having  much in the way of side effects.  We also discussed the importance of smoking cessation.   PLAN:  1. CBC today. In addition to TSH and liver enzymes. 2. He is hepatitis A and B naive, and I have instructed him to contact the [facility] Department of [facility] for hepatitis A and B vaccinations. This has not occurred to date. 3. To return approximately 4 weeks' time.            Brooke Dare, MD   ADDENDUM  No record of labs being done on our account.  403 .[id number]  D:  Thu Nov 29 18:30:52 2012 ; T:  Thu Nov 29 19:41:57 2012  Job #:  [id number]

## 2011-09-21 ENCOUNTER — Ambulatory Visit: Payer: Self-pay | Admitting: Gastroenterology

## 2011-09-21 ENCOUNTER — Telehealth: Payer: Self-pay | Admitting: Gastroenterology

## 2011-09-21 NOTE — Telephone Encounter (Signed)
Thomas Ball called to report that he lost his [facility] assistance but will obtain a renewal on Sep 27, 2011.   I asked that he get the labs done that he did not get at the last visit and be rescheduled for a month from now.

## 2011-10-07 LAB — TSH: TSH: 1.627 u[IU]/mL (ref 0.350–4.500)

## 2011-10-07 LAB — CBC WITH DIFFERENTIAL/PLATELET
Basophils Absolute: 0 10*3/uL (ref 0.0–0.1)
Basophils Relative: 0 % (ref 0–1)
Eosinophils Absolute: 0 10*3/uL (ref 0.0–0.7)
Hemoglobin: 13 g/dL (ref 13.0–17.0)
MCH: 34.1 pg — ABNORMAL HIGH (ref 26.0–34.0)
MCHC: 30.6 g/dL (ref 30.0–36.0)
Monocytes Relative: 11 % (ref 3–12)
Neutro Abs: 1.3 10*3/uL — ABNORMAL LOW (ref 1.7–7.7)
Neutrophils Relative %: 51 % (ref 43–77)
RDW: 15.9 % — ABNORMAL HIGH (ref 11.5–15.5)

## 2011-10-07 LAB — HEPATIC FUNCTION PANEL
ALT: 50 U/L (ref 0–53)
AST: 40 U/L — ABNORMAL HIGH (ref 0–37)
Albumin: 4.3 g/dL (ref 3.5–5.2)
Alkaline Phosphatase: 14 U/L — ABNORMAL LOW (ref 39–117)
Indirect Bilirubin: 0.7 mg/dL (ref 0.0–0.9)
Total Protein: 7.3 g/dL (ref 6.0–8.3)

## 2011-10-19 ENCOUNTER — Ambulatory Visit (INDEPENDENT_AMBULATORY_CARE_PROVIDER_SITE_OTHER): Payer: Self-pay | Admitting: Gastroenterology

## 2011-10-19 DIAGNOSIS — B182 Chronic viral hepatitis C: Secondary | ICD-10-CM

## 2011-10-19 LAB — CBC WITH DIFFERENTIAL/PLATELET
Basophils Absolute: 0 10*3/uL (ref 0.0–0.1)
Basophils Relative: 1 % (ref 0–1)
Hemoglobin: 12.6 g/dL — ABNORMAL LOW (ref 13.0–17.0)
Lymphocytes Relative: 34 % (ref 12–46)
MCHC: 31.8 g/dL (ref 30.0–36.0)
Neutro Abs: 1.2 10*3/uL — ABNORMAL LOW (ref 1.7–7.7)
Neutrophils Relative %: 53 % (ref 43–77)
RDW: 15.3 % (ref 11.5–15.5)
WBC: 2.2 10*3/uL — ABNORMAL LOW (ref 4.0–10.5)

## 2011-10-19 LAB — HEPATIC FUNCTION PANEL
AST: 44 U/L — ABNORMAL HIGH (ref 0–37)
Albumin: 3.8 g/dL (ref 3.5–5.2)
Alkaline Phosphatase: 16 U/L — ABNORMAL LOW (ref 39–117)
Total Bilirubin: 0.9 mg/dL (ref 0.3–1.2)
Total Protein: 6.7 g/dL (ref 6.0–8.3)

## 2011-10-19 NOTE — Progress Notes (Signed)
NAME: Thomas Ball, Thomas Ball  MR#: [id number]      DATE: 08/24/2011  DOB: 05-08-1961    cc:  Primary Care Physician: [facility] Department of [facility], [address] [place] [address], [place], [place] [address], Fax [phone]  Referring Physician: Gerrit Halls, NP c/o [name], MD, [facility], [address], [place], [place] [address], Fax [phone]    REASON FOR VISIT:  Followup genotype 3a HCV, week 20 of therapy.   HISTORY:  Patient returns today unaccompanied. His start date for pegylated interferon and ribavirin for genotype 3 HCV was on 05/25/2011. He is  taking his 21st injection of Pegasys tomorrow. He is tolerating therapy well without any complaints.  He was contacted by [facility] for meds.  They have offered to ship more meds so it appears that he can continue treatment beyond 24 weeks.  PAST MEDICAL HISTORY:  No interval change.   CURRENT MEDICATIONS:  Ribavirin 400 mg p.o. b.i.d., Pegasys 180 mcg subcu weekly.   ALLERGIES:  Denies.   HABITS:  Still smoking half pack of cigarettes per day. Alcohol, no interval consumption.   REVIEW OF SYSTEMS:  All 10 systems reviewed today with the patient and they are negative other than which is mentioned above. His CES-D was 3.   PHYSICAL EXAMINATION:  Constitutional: Central obesity but otherwise well. Vital signs: Height 64 inches, weight 244 pounds. Blood pressure 130/84, pulse 76, temperature 98.1 Fahrenheit.   ASSESSMENT:  The patient is a 51 year old gentleman with a history of genotype 3a HCV. He is currently completing week 20 of therapy with start on 05/25/2011, and takes his 21st injection of Pegasys tomorrow.   His HCV RNA at week 4 was detectable but not quantifiable. His week 6 HCV was undetectable. I have faxed [facility] the possibility of extending his access to care from 24 to at least 42 weeks because of a slow viral response, in addition to the fact that he has central obesity. I have not received any news to the  contrary suggesting I can continue with treatment and they will cover his medications.  In my discussion today with the patient, we again discussed the implications of his viral kinetics and extending therapy to at least week 42, possibly 48. He states he is willing to continue, as he is not having much in the way of side effects. I have told him if [facility] is willing to supply more meds, he is to take delivery but if they won't then he should complete treatment and further options may be available later this year or next year if he relapses.  We discussed the meaning of SVR.  We also discussed the importance of smoking cessation.   PLAN:  1. CBC and liver enzymes. 2. He is hepatitis A and B naive, and I have instructed him to contact the [facility] Department of [facility] for hepatitis A and B vaccinations. This has not occurred to date. 3. To return approximately 4 weeks' time.  Brooke Dare, MD

## 2011-11-09 ENCOUNTER — Ambulatory Visit (INDEPENDENT_AMBULATORY_CARE_PROVIDER_SITE_OTHER): Payer: Self-pay | Admitting: Gastroenterology

## 2011-11-09 DIAGNOSIS — B182 Chronic viral hepatitis C: Secondary | ICD-10-CM

## 2011-11-09 NOTE — Progress Notes (Signed)
NAME: Thomas Ball, Thomas Ball  MR#: [id number]      DATE: 11/09/2011 DOB: [birth year]    cc:  Primary Care Physician: [facility] Department of [facility], [address] [place] [address], [place], [place] [address], Fax [phone]   Referring Physician: Gerrit Halls, NP c/o [name], MD, [facility], [address], [place], [place] [address], Fax [phone]    REASON FOR VISIT:  Followup genotype 3a HCV, week 24 of therapy.   HISTORY:  Patient returns today unaccompanied. His start date for pegylated interferon and ribavirin for genotype 3 HCV was on 05/25/2011. He is taking his 24st injection of Pegasys tomorrow. He is tolerating therapy well without any complaints. [facility] has supplied three more months of meds, so they agreed to extend therapy because his HCV RNA was still detectable at week 4.   PAST MEDICAL HISTORY:  No interval change.   CURRENT MEDICATIONS:  Ribavirin 400 mg p.o. b.i.d., Pegasys 180 mcg subcu weekly.   ALLERGIES:  Denies.   HABITS:  Still smoking a little less than half pack of cigarettes per day. Alcohol, no interval consumption.   REVIEW OF SYSTEMS:  All 10 systems reviewed today with the patient and they are negative other than which is mentioned above. His CES-D was 0.   PHYSICAL EXAMINATION:  Constitutional: Central obesity but otherwise well. Vital signs: Height 64 inches, weight 239 pounds. Blood pressure 140/94, pulse 75, temperature 97.9 Fahrenheit.   ASSESSMENT:  The patient is a 51 year old gentleman with a history of genotype 3a HCV. He is currently completing week 23 of therapy with start on 05/25/2011, and takes his 24th injection of Pegasys tomorrow.  His HCV RNA at week 4 was detectable but not quantifiable. His week 6 HCV was undetectable.  His treatment will be extended 24 to at least 42 weeks, ideally 48, because of a slow viral response, in addition to the fact that he has central obesity.  In my discussion today with the patient, we again  discussed the implications of his viral kinetics and extending therapy to at least week 42, possibly 48. He again states he is willing to continue, as he is not having much in the way of side effects.  I reinforced the importance of smoking cessation.   PLAN:  1. CBC and liver enzymes. 2. He is hepatitis A and B naive, and I have instructed him to contact the [facility] Department of [facility] for hepatitis A and B vaccinations. This has not occurred to date. 3. To return approximately 4 weeks' time.                Brooke Dare, MD    ADDENDUM: Labs not done.

## 2011-11-24 ENCOUNTER — Other Ambulatory Visit: Payer: Self-pay | Admitting: Gastroenterology

## 2011-11-25 LAB — CBC WITH DIFFERENTIAL/PLATELET
Eosinophils Relative: 0 % (ref 0–5)
HCT: 39.6 % (ref 39.0–52.0)
Hemoglobin: 12 g/dL — ABNORMAL LOW (ref 13.0–17.0)
Lymphocytes Relative: 40 % (ref 12–46)
Lymphs Abs: 0.7 10*3/uL (ref 0.7–4.0)
MCV: 111.9 fL — ABNORMAL HIGH (ref 78.0–100.0)
Monocytes Absolute: 0.3 10*3/uL (ref 0.1–1.0)
Monocytes Relative: 14 % — ABNORMAL HIGH (ref 3–12)
RBC: 3.54 MIL/uL — ABNORMAL LOW (ref 4.22–5.81)
WBC: 1.8 10*3/uL — ABNORMAL LOW (ref 4.0–10.5)

## 2011-11-25 LAB — HEPATIC FUNCTION PANEL
Bilirubin, Direct: 0.3 mg/dL (ref 0.0–0.3)
Indirect Bilirubin: 0.9 mg/dL (ref 0.0–0.9)
Total Bilirubin: 1.2 mg/dL (ref 0.3–1.2)

## 2011-12-07 ENCOUNTER — Ambulatory Visit: Payer: Self-pay | Admitting: Gastroenterology

## 2011-12-14 ENCOUNTER — Ambulatory Visit (INDEPENDENT_AMBULATORY_CARE_PROVIDER_SITE_OTHER): Payer: Self-pay | Admitting: Gastroenterology

## 2011-12-14 DIAGNOSIS — B182 Chronic viral hepatitis C: Secondary | ICD-10-CM

## 2011-12-14 LAB — HEPATIC FUNCTION PANEL
AST: 50 U/L — ABNORMAL HIGH (ref 0–37)
Bilirubin, Direct: 0.3 mg/dL (ref 0.0–0.3)
Indirect Bilirubin: 0.7 mg/dL (ref 0.0–0.9)
Total Bilirubin: 1 mg/dL (ref 0.3–1.2)

## 2011-12-14 LAB — CBC WITH DIFFERENTIAL/PLATELET
Basophils Absolute: 0 10*3/uL (ref 0.0–0.1)
Eosinophils Absolute: 0 10*3/uL (ref 0.0–0.7)
Eosinophils Relative: 2 % (ref 0–5)
Lymphocytes Relative: 43 % (ref 12–46)
MCH: 33.6 pg (ref 26.0–34.0)
MCV: 107.3 fL — ABNORMAL HIGH (ref 78.0–100.0)
Neutrophils Relative %: 45 % (ref 43–77)
Platelets: 110 10*3/uL — ABNORMAL LOW (ref 150–400)
RDW: 14.7 % (ref 11.5–15.5)
WBC: 2.5 10*3/uL — ABNORMAL LOW (ref 4.0–10.5)

## 2011-12-18 LAB — HEPATITIS C RNA QUANTITATIVE: HCV Quantitative: NOT DETECTED IU/mL (ref <43)

## 2011-12-29 NOTE — Progress Notes (Signed)
Thomas Ball, ZIMMERLE  MR#:  [id number]      DATE:  12/14/2011  DOB:  1961-05-18    cc: Consulting Physician:  Karren Burly, MD, Cardiology, [facility], [facility], [address] [place], [place], [place] [address] Primary Care Physician:  [facility] [place], [address] [place] [address], [place], [place] [address], Fax [phone] Referring Physician:  Gerrit Halls, NP, c/o [name], MD, [facility], [address], [place], [place] [address], Fax [phone]    REASON FOR VISIT:  Followup of genotype 3a HCV, off therapy.   HISTORY:  The patient returns today unaccompanied. When last seen on 11/09/2011, he was at week 50 of treatment having started on pegylated interferon, ribavirin for genotype 3a HCV on 05/25/2011. Because his RNA was still detectable at week 4 weeks, we decided to continue on treatment for a total of up to 48 weeks.  He was hospitalized with complaints of chest pain and shortness of breath on 12/06/2011, at [facility]. During this hospitalization, his PEG interferon was discontinued because of supposed neutropenia, which in fact, looking through his notes was not significant enough, and expected as part of his treatment. He then thought because his interferon was discontinued, he should discontinue the ribavirin, as well, so he has been off therapy for at least 7 days. He is due to follow up with Dr. Titus Mould of Cardiology as an outpatient. He has a Holter monitor on at this time.   PAST MEDICAL HISTORY: There have been no other interval changes.   CURRENT MEDICATIONS:  Atenolol 12.5 mg b.i.d.   ALLERGIES: Denies.   HABITS:  Smoking, still smoking approximately less than half pack of cigarettes per day. Alcohol, no interval consumption.    REVIEW OF SYSTEMS:  All 10 systems reviewed today with the patient and they are negative other than which was mentioned above. His CES-D was zero.   PHYSICAL  EXAMINATION:  Constitutional: Central obesity.  Height 64 inches, weight 233 pounds, Vital signs: Blood pressure 127/76, pulse 66, temperature 98 Fahrenheit.   LABORATORY STUDIES:  From review of his labs from [facility] on 09/28/2011, his creatinine was 0.74. Troponin was consistently negative. On 12/07/1998, his INR was 1.0.  On 12/07/2011. His white count was 1.7, which the ANC was 0.7, hemoglobin 11.5, MCV 104.6, platelet count of 96,000.   Echocardiogram showed LVEF of 50%-55% without wall motion abnormality. The right ventricle was normal in size and contraction.   A CT angiogram of the chest showed a small 6 mm nodule in the right lower lobe for which a 80-month followup was suggested, which would be in September 2013.  assessment:  The patient is a 51 year old gentleman with history of genotype 3a hepatitis C who commenced on therapy on 05/25/2011 with a plan of taking 48 weeks of therapy because his week 4 HCV RNA was still detectable but not quantifiable and it was only at week 6 that he was undetectable. He has effectively received 28 weeks of treatment. He is now off therapy for 50 week.   In my discussion today with the patient, I explained to him that if neutropenia was not sufficiently his serious enough to discontinue treatment. Nevertheless, it has been discontinued. We discussed the possibility of stopping therapy versus continuing. Noted that his concentration has significantly improved, and he very much likes this. He would like to discontinue treatment of his hepatitis C. I explained to him that because literature on extending therapy beyond 24  weeks, with somewhat mixed genotype, thought it was reasonable to stop and then check his HCV RNA today for his end of treatment response and recheck it again at 3 and 6 months to assess him for SVR.          PLAN:  1. Will check an HCV RNA today in addition to liver enzymes and CBC.  2. Return in 3 months' time in followup.    3. He will be due for another CT scan of chest September 2013, which can be done through his referring physician if he is not seeing me at that time.               Brooke Dare, MD   ADDENDUM:  HCV RNA No detectable level of HCV RNA.   403 .[id number]  D:  Thu Mar 21 20:57:24 2013 ; T:  Sun Mar 24 09:00:52 2013  Job #:  [id number]

## 2012-01-01 ENCOUNTER — Encounter: Payer: Self-pay | Admitting: Gastroenterology

## 2012-01-01 ENCOUNTER — Telehealth: Payer: Self-pay | Admitting: Gastroenterology

## 2012-01-01 NOTE — Telephone Encounter (Signed)
Let's have pt come in for a visit.  i didn't realize it had been a year since we had seen him. Need to update AFP, [place] of abd.

## 2012-01-01 NOTE — Telephone Encounter (Signed)
Pt aware of OV on 4/25 @ 10 with AS and appt card was mailed

## 2012-01-18 ENCOUNTER — Other Ambulatory Visit: Payer: Self-pay | Admitting: Gastroenterology

## 2012-01-18 ENCOUNTER — Ambulatory Visit (INDEPENDENT_AMBULATORY_CARE_PROVIDER_SITE_OTHER): Payer: Self-pay | Admitting: Gastroenterology

## 2012-01-18 ENCOUNTER — Encounter: Payer: Self-pay | Admitting: Gastroenterology

## 2012-01-18 VITALS — BP 120/80 | HR 74 | Temp 97.9°F | Ht 65.0 in | Wt 235.0 lb

## 2012-01-18 DIAGNOSIS — B1920 Unspecified viral hepatitis C without hepatic coma: Secondary | ICD-10-CM

## 2012-01-18 NOTE — Progress Notes (Signed)
Referring Provider: [facility] Primary Care Physician:  [facility] Primary Gastroenterologist: Dr. Jena Gauss  Chief Complaint  Patient presents with  . Follow-up    hep c    HPI:   Thomas Ball presents today, hx of Hep C, gentoype 3a, seen originally by [place] in March 2012 as initial consult. Referred to Dr. Jacqualine Mau at that time for treatment. Started treatment Aug 2012, received 28 weeks of treatment. Recently at [facility] March 2013 with chest pain, SOB. Noted neutropenia (likely secondary to treatment), PEG interferon d/c. Pt thought he was to d/c ribavirin as well. When he saw Dr. Jacqualine Mau in March 2013, he had been off therapy at least a week. Pt desired to stop treatment. HCV RNA drawn (was non-detectable) and will be rechecked by Dr. Jacqualine Mau again at 3 and 6 mos.   [facility] and B [facility] in May. No jaundice, pruritis, change in bowel habits. Denies any rectal bleeding. No abdominal pain. States very rare incidence of "choking" if he swallows wrong. Concerned about "spont on lung" noted at [facility].   No records available.   No ETOH X 17 mos, no drugs.   March 2013 labs in epic: HFP: Ast 50, Alt 58, AP 13 CBC: Hgb 12.5, WBC 2.5, Plt 110  Past Medical History  Diagnosis Date  . GERD (gastroesophageal reflux disease)   . HCV (hepatitis C virus)     Treatment: Dr. Jacqualine Mau    Past Surgical History  Procedure Date  . Jaw broken   . Cholecystectomy   . Lipoma removals     SEVERAL    No current outpatient prescriptions on file.    Allergies as of 01/18/2012  . (No Known Allergies)    Family History  Problem Relation Age of Onset  . Colon cancer Neg Hx     History   Social History  . Marital Status: Divorced    Spouse Name: N/A    Number of Children: N/A  . Years of Education: N/A   Social History Main Topics  . Smoking status: Current Everyday Smoker    Types: Cigarettes  . Smokeless tobacco: Not on file  . Alcohol Use: No  . Drug Use: Not on  file  . Sexually Active: Not on file   Other Topics Concern  . Not on file   Social History Narrative  . No narrative on file    Review of Systems: Gen: Denies fever, chills, anorexia. Denies fatigue, weakness, weight loss.  CV: Denies chest pain, palpitations, syncope, peripheral edema, and claudication. Resp: Denies dyspnea at rest, cough, wheezing, coughing up blood, and pleurisy. GI: Denies vomiting blood, jaundice, and fecal incontinence.   Denies dysphagia or odynophagia. Derm: Denies rash, itching, dry skin Psych: Denies depression, anxiety, memory loss, confusion. No homicidal or suicidal ideation.  Heme: Denies bruising, bleeding, and enlarged lymph nodes.  Physical Exam: BP 120/80  Pulse 74  Temp(Src) 97.9 F (36.6 C) (Temporal)  Ht 5\' 5"  (1.651 m)  Wt 235 lb (106.595 kg)  BMI 39.11 kg/m2 General:   Alert and oriented. No distress noted. Pleasant and cooperative.  Head:  Normocephalic and atraumatic. Eyes:  Conjuctiva clear without scleral icterus. Mouth:  Oral mucosa pink and moist.  Neck:  Supple, without mass or thyromegaly. Heart:  S1, S2 present without murmurs, rubs, or gallops. Regular rate and rhythm. Abdomen:  Large AP diameter, difficult to appreciate significant HSM. +BS, soft, non-tender and non-distended. No rebound or guarding.  Msk:  Symmetrical without  gross deformities. Normal posture. Neurologic:  Alert and  oriented x4;  grossly normal neurologically. Skin:  Intact without significant lesions or rashes. Cervical Nodes:  No significant cervical adenopathy. Psych:  Alert and cooperative. Normal mood and affect.

## 2012-01-18 NOTE — Patient Instructions (Signed)
Please have blood work done today. We will call you with these results.  I'm requesting the records from [facility] for our review. We may need to do an ultrasound of your liver if a CT scan was not performed. It sounds like it was, so this should be sufficient.  We have referred you to a pulmonologist.   We will see you back in 6 months to set up your first colonoscopy and see how you are doing.   Call us with any questions in the meantime!

## 2012-01-21 ENCOUNTER — Encounter: Payer: Self-pay | Admitting: Gastroenterology

## 2012-01-21 DIAGNOSIS — B1920 Unspecified viral hepatitis C without hepatic coma: Secondary | ICD-10-CM | POA: Insufficient documentation

## 2012-01-21 NOTE — Progress Notes (Signed)
Quick Note:  AFP 6.9, normal. 11 mos ago was 12.4, likely due to active HCV. Has undergone treatment, doing well. Repeat in 6 months. ______

## 2012-01-21 NOTE — Assessment & Plan Note (Signed)
51 year old with chronic HCV, s/p treatment with Dr. Jacqualine Mau, most recent HCV RNA undetectable. Due for repeat in 3 and 6 mos. Doing well, no complaints. Current HFP on file. Need updated AFP, have requested records from [place]. Appears may have completed CT scan at that time. Would like to have on file. If none performed, proceed with [place] abd. Mild anemia noted on labs, likely due to current diagnosis. Would like updated iron, ferritin.   AFP, iron, ferritin Obtain [facility] reports Return in 6 mos to set up TCS

## 2012-01-21 NOTE — Progress Notes (Signed)
Thomas Ball: Can we add iron and ferritin? I did not order at [facility].  Soledad Gerlach: I got reports from ED visit at [facility], but I need reports from March 2013. Found out that is when he was hospitalized. Thanks!

## 2012-01-22 NOTE — Progress Notes (Signed)
Faxed to PCP

## 2012-01-23 NOTE — Progress Notes (Signed)
Iron and ferritin have been added. Spoke with Marylene Land at [place].

## 2012-01-25 ENCOUNTER — Other Ambulatory Visit: Payer: Self-pay | Admitting: Gastroenterology

## 2012-01-25 ENCOUNTER — Other Ambulatory Visit: Payer: Self-pay

## 2012-01-25 DIAGNOSIS — B1920 Unspecified viral hepatitis C without hepatic coma: Secondary | ICD-10-CM

## 2012-01-25 NOTE — Progress Notes (Signed)
Quick Note:  Lab order on file  ______ 

## 2012-02-07 NOTE — Progress Notes (Signed)
Quick Note:  Iron normal. Ferritin mildly elevated. Question if due to known HCV.   ______

## 2012-02-07 NOTE — Progress Notes (Signed)
Can we see if a recent CT (2013) has been done at [facility]? Thanks!

## 2012-02-08 NOTE — Progress Notes (Signed)
Record Requested

## 2012-02-08 NOTE — Progress Notes (Addendum)
Received CT reports. Only CT chest performed March 2013: No PE 6 mm non-calcified 6mm nodule within right lower lung lobe Calcified nodule left upper lung lobe.   1. Please send report to pt's PCP for follow-up. 2. Needs Korea of abdomen  3. Return in Oct 2013 (on recall list).

## 2012-02-14 ENCOUNTER — Encounter: Payer: Self-pay | Admitting: Gastroenterology

## 2012-02-14 ENCOUNTER — Ambulatory Visit (INDEPENDENT_AMBULATORY_CARE_PROVIDER_SITE_OTHER): Payer: Self-pay | Admitting: Internal Medicine

## 2012-02-14 ENCOUNTER — Encounter: Payer: Self-pay | Admitting: Internal Medicine

## 2012-02-14 ENCOUNTER — Other Ambulatory Visit: Payer: Self-pay | Admitting: Gastroenterology

## 2012-02-14 VITALS — BP 108/80 | HR 64 | Temp 98.6°F | Ht 64.0 in | Wt 238.2 lb

## 2012-02-14 DIAGNOSIS — B1920 Unspecified viral hepatitis C without hepatic coma: Secondary | ICD-10-CM

## 2012-02-14 DIAGNOSIS — J449 Chronic obstructive pulmonary disease, unspecified: Secondary | ICD-10-CM

## 2012-02-14 DIAGNOSIS — R911 Solitary pulmonary nodule: Secondary | ICD-10-CM | POA: Insufficient documentation

## 2012-02-14 NOTE — Progress Notes (Signed)
  Subjective:    Patient ID: Thomas Ball, male    DOB: 1960/12/23   MRN: [id number]  HPI  37 yowm smoker referred by Dr Odie Sera PA for spn  02/14/2012 1st pulmonary ov cc onset sob in late 90's with wt around 200 progressed to point where best days has trouble finishing mowing yard but  on worse days can't get 50 ft down where the dogs are kept  And back uphill to house with most recent flare attributed to Hepatitis meds which were stopped at admission > back to baseline and assoc with cough much better. No excess mucus, sleeping ok, no longer choking or chest tightness and not requiring any resp rx  Sleeping ok without nocturnal  or early am exacerbation  of respiratory  c/o's or need for noct saba. Also denies any obvious fluctuation of symptoms with weather or environmental changes or other aggravating or alleviating factors except as outlined above     Review of Systems  Constitutional: Negative for fever and unexpected weight change.  HENT: Positive for congestion, sneezing and dental problem. Negative for ear pain, nosebleeds, sore throat, rhinorrhea, trouble swallowing, postnasal drip and sinus pressure.   Eyes: Negative for redness and itching.  Respiratory: Positive for cough, choking, chest tightness and shortness of breath. Negative for wheezing.   Cardiovascular: Negative for palpitations and leg swelling.  Gastrointestinal: Negative for nausea and vomiting.  Genitourinary: Negative for dysuria.  Musculoskeletal: Negative for joint swelling.  Skin: Negative for rash.  Neurological: Negative for headaches.  Hematological: Does not bruise/bleed easily.  Psychiatric/Behavioral: Negative for dysphoric mood. The patient is not nervous/anxious.        Objective:   Physical Exam  amb obese wm nad Wt 238 02/14/2012  HEENT: nl dentition, turbinates, and orophanx. Nl external ear canals without cough reflex   NECK :  without JVD/Nodes/TM/ nl carotid upstrokes  bilaterally   LUNGS: no acc muscle use, clear to A and P bilaterally without cough on insp or exp maneuvers   CV:  RRR  no s3 or murmur or increase in P2, no edema   ABD:  soft and nontender with nl excursion in the supine position. No bruits or organomegaly, bowel sounds nl  MS:  warm without deformities, calf tenderness, cyanosis or clubbing  SKIN: warm and dry without lesions    NEURO:  alert, approp, no deficits        Assessment & Plan:

## 2012-02-14 NOTE — Assessment & Plan Note (Signed)
CT reviewed. 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

## 2012-02-14 NOTE — Progress Notes (Signed)
Pt aware, ok to set up U/S, Crystal please schedule.

## 2012-02-14 NOTE — Assessment & Plan Note (Signed)
Relatively mild but still actively smoking  I reviewed the Flethcher curve with patient that basically indicates  if you quit smoking when your best day FEV1 is still well preserved(which his appears to be) it is highly unlikely you will progress to severe disease and informed the patient there was no medication on the market that has proven to change the curve or the likelihood of progression.  Therefore stopping smoking and maintaining abstinence is the most important aspect of care, not choice of inhalers or for that matter, doctors.

## 2012-02-14 NOTE — Patient Instructions (Signed)
Limited CT of Chest in 6 months in all that's recommended for now other than stop smoking - it's the most important aspect of your care

## 2012-02-14 NOTE — Progress Notes (Signed)
Korea scheduled for 05/28 @ 9am - letter mailed

## 2012-02-20 ENCOUNTER — Other Ambulatory Visit (HOSPITAL_COMMUNITY): Payer: Self-pay

## 2012-03-21 ENCOUNTER — Ambulatory Visit: Payer: Self-pay | Admitting: Gastroenterology

## 2012-05-23 ENCOUNTER — Ambulatory Visit (INDEPENDENT_AMBULATORY_CARE_PROVIDER_SITE_OTHER): Payer: Self-pay | Admitting: Gastroenterology

## 2012-05-23 DIAGNOSIS — B182 Chronic viral hepatitis C: Secondary | ICD-10-CM

## 2012-05-24 LAB — HEPATIC FUNCTION PANEL
AST: 25 U/L (ref 0–37)
Alkaline Phosphatase: 13 U/L — ABNORMAL LOW (ref 39–117)
Bilirubin, Direct: 0.3 mg/dL (ref 0.0–0.3)
Total Bilirubin: 1.6 mg/dL — ABNORMAL HIGH (ref 0.3–1.2)

## 2012-06-06 ENCOUNTER — Encounter: Payer: Self-pay | Admitting: Gastroenterology

## 2012-06-06 NOTE — Progress Notes (Signed)
Thomas Ball, Ball  MR#:  [id number]      DATE:  05/23/2012  DOB:  Mar 02, 1961    cc: Gerrit Halls, NP, C/O Roetta Sessions, M.D., [facility], [address] [place], [place] [address], fax [phone] Karren Burly, M.D., [facility], [facility], [address], [place], [place] [address]. [facility] [employer], [address], [place], [place] [address], fax # [phone].    primary care physician:  [facility], [address], [place], [place] [address], fax # [phone].  consulting physician:  Karren Burly, M.D., [facility], [facility], [address] [place], [place] [address].  REASON FOR VISIT:  Followup of genotype 3a HCV for SVR determination.   HISTORY:  The patient returns today unaccompanied. He has no symptoms to suggest active or decompensated liver disease. There are no symptoms to suggest cryoglobulin mediated disease.   PAST MEDICAL HISTORY:  No interval change since last being seen. He indicates that the atenolol he was placed on in the past when he presented to [facility] with chest pain has now been discontinued since I last saw him.   CURRENT MEDICATIONS:  None.   ALLERGIES:  Denies.   HABITS:  Still smoking approximately five to 10 cigarettes per day. Alcohol denies interval consumption.   REVIEW OF SYSTEMS:  All 10 systems reviewed today with the patient are negative other than which is mentioned above. His CES-D was 0.   PHYSICAL EXAMINATION:  Constitutional: Obese. Vital Signs: Height 64 inches, weight 252 pounds up from 233 on last visit. Blood pressure 117/81, pulse 70, temperature 97.5 degrees Fahrenheit.   ASSESSMENT:  The patient is a 51 year old gentleman with history of genotype 3a hepatitis C commenced therapy on 05/25/2011, with a plan of taking 48 weeks of therapy because his week four of  HCV RNA was detectable but not quantifiable. It was only at week six that he was undetectable. He effectively received 28 weeks of therapy before it was discontinued during a hospitalization on 12/06/2011, when he presented to [facility] with shortness of breath. The Pegasys was discontinued because of supposed neutropenia, which was in fact not significant to require discontinuation. Because the interferon was discontinued, he took it upon himself to stop his ribavirin as well. At his last visit, he was off therapy for a week. He had an end of treatment response with a negative viral load. Today, I will test him for SVR.   In my discussion today with the patient, I explained we would check his HCV RNA today and liver enzymes. I explained to him if his RNA is negative, he is considered cured and does not need further followup. I warned him, however, that his antibody maybe positive for life, and can be misinterpreted as evidence of infection. I have told him if he is to be asked about this, he is to have an HCV RNA done. I have warned him about the consequences of weight gain and the potential for NAFLD. I have also counseled him on the need to stop smoking.   PLAN:  1. HCV RNA and liver enzymes today.  2. If his RNA is negative, will send him a letter indicating that he no longer needs to return to clinic.  3. If he positive, we will need to arrange followup thereafter.  4. It will be recalled during his hospitalization in 10/2011, he had a CT  angiogram of the chest for shortness breath which showed a 6 mm nodule in the right lower lobe. A 65-month followup was suggested, which will be 05/2012. If his HCV RNA is negative today, as he is no longer going to be under my care, this should be followed up with his local physicians. Furthermore, I was not involved in that hospitalization and did not order the CT scan.            Brooke Dare, MD   [facility] HCV RNA negative.  Will be discharged  from my care.  [phone]  D:  Thu Aug 29 17:50:14 2013 ; T:  Fri Aug 30 08:21:56 2013  Job #:  [id number]

## 2012-06-20 ENCOUNTER — Encounter: Payer: Self-pay | Admitting: Gastroenterology

## 2012-06-20 ENCOUNTER — Telehealth: Payer: Self-pay | Admitting: Gastroenterology

## 2012-06-20 NOTE — Telephone Encounter (Signed)
I called and relayed the results of his last HCV RNA because he has not received the letter I wrote.  I also mailed another copy from [facility] at [facility].

## 2012-07-18 ENCOUNTER — Encounter: Payer: Self-pay | Admitting: Gastroenterology

## 2012-07-18 ENCOUNTER — Other Ambulatory Visit: Payer: Self-pay | Admitting: Internal Medicine

## 2012-07-18 ENCOUNTER — Ambulatory Visit (INDEPENDENT_AMBULATORY_CARE_PROVIDER_SITE_OTHER): Payer: Self-pay | Admitting: Gastroenterology

## 2012-07-18 VITALS — BP 124/85 | HR 63 | Temp 97.4°F | Ht 64.0 in | Wt 262.0 lb

## 2012-07-18 DIAGNOSIS — K219 Gastro-esophageal reflux disease without esophagitis: Secondary | ICD-10-CM

## 2012-07-18 DIAGNOSIS — Z1211 Encounter for screening for malignant neoplasm of colon: Secondary | ICD-10-CM

## 2012-07-18 DIAGNOSIS — Z1212 Encounter for screening for malignant neoplasm of rectum: Secondary | ICD-10-CM | POA: Insufficient documentation

## 2012-07-18 DIAGNOSIS — B1920 Unspecified viral hepatitis C without hepatic coma: Secondary | ICD-10-CM

## 2012-07-18 MED ORDER — PEG 3350-KCL-NA BICARB-NACL 420 G PO SOLR: 4000.0000 mL | ORAL | Status: DC

## 2012-07-18 MED ORDER — OMEPRAZOLE 20 MG PO CPDR: 20.0000 mg | DELAYED_RELEASE_CAPSULE | Freq: Every day | ORAL | Status: DC

## 2012-07-18 NOTE — Assessment & Plan Note (Signed)
Status post treatment by Dr. Jacqualine Mau. Last RNA undetectable in Aug 2013; he has been released from Dr. Jacqualine Mau care due to completion of treatments. Due for routine Korea of abd, AFP. Twinrix prescription provided.

## 2012-07-18 NOTE — Assessment & Plan Note (Signed)
Likely related to increased body habitus, poor dietary choices. No dysphagia or red flags. +nocturnal reflux. Start Prilosec, as he is currently not on a PPI. Return in 3 months. If no improvement, consider EGD.

## 2012-07-18 NOTE — Assessment & Plan Note (Signed)
Due for routine, average-risk screening colonoscopy. No lower GI symptoms currently.   Proceed with TCS with Dr. Jena Gauss in near future: the risks, benefits, and alternatives have been discussed with the patient in detail. The patient states understanding and desires to proceed.

## 2012-07-18 NOTE — Progress Notes (Signed)
Referring Provider: No ref. provider found Primary Care Physician:  Tylene Fantasia., PA Primary Gastroenterologist: Dr. Jena Gauss   Chief Complaint  Patient presents with  . Colonoscopy    HPI:   51 year old male with hx of Hep C, gentoype 3a, seen originally by [place] in March 2012 as initial consult. Referred to Dr. Jacqualine Mau at that time for treatment. Started treatment Aug 2012, received 28 weeks of treatment. Hospitalized at [facility] March 2013 with chest pain, SOB. Noted neutropenia (likely secondary to treatment), PEG interferon d/c. Pt thought he was to d/c ribavirin as well. When he saw Dr. Jacqualine Mau in March 2013, he had been off therapy at least a week. Pt desired to stop treatment. HCV RNA drawn (was non-detectable) and was rechecked again in Aug 2013; it was undetectable. He has been discharged from Dr. Jacqualine Mau care due to completion of treatment and negative RNA.  Incidentally, CT angiogram performed due to SOB during [facility] hospitalization. Showed 6 mm nodule in right lower lobe. Seen by [facility] in [place], noted no need for repeat. Followed by them. Also due for routine Korea and AFP. Needs to set up first average risk screening colonoscopy.    Hep A and B vaccinations: hasn't completed. Denies any issues. No rectal bleeding, change in bowel habits. Intermittent heartburn if eats a lot of spicy foods. States has choked a few times in his sleep from reflux. Denies dysphagia.    Past Medical History  Diagnosis Date  . GERD (gastroesophageal reflux disease)   . HCV (hepatitis C virus)     Treatment: Dr. Jacqualine Mau  . Skin cancer 1992    removed from side of head    Past Surgical History  Procedure Date  . Jaw broken   . Cholecystectomy   . Lipoma removals     SEVERAL    Current Outpatient Prescriptions  Medication Sig Dispense Refill  . Acetaminophen (TYLENOL PO) Take by mouth as needed.      Marland Kitchen aspirin 81 MG tablet Take 81 mg by mouth as needed.        Allergies as of  07/18/2012  . (No Known Allergies)    Family History  Problem Relation Age of Onset  . Colon cancer Neg Hx     History   Social History  . Marital Status: Divorced    Spouse Name: N/A    Number of Children: N/A  . Years of Education: N/A   Occupational History  . concrete work    Social History Main Topics  . Smoking status: Current Every Day Smoker -- 0.5 packs/day for 28 years    Types: Cigarettes  . Smokeless tobacco: Never Used  . Alcohol Use: No  . Drug Use: No  . Sexually Active: None   Other Topics Concern  . None   Social History Narrative  . None    Review of Systems: Gen: Denies fever, chills, anorexia. Denies fatigue, weakness, weight loss.  CV: Denies chest pain, palpitations, syncope, peripheral edema, and claudication. Resp: +DOE GI: Denies vomiting blood, jaundice, and fecal incontinence.   Denies dysphagia or odynophagia. Derm: Denies rash, itching, dry skin Psych: Denies depression, anxiety, memory loss, confusion. No homicidal or suicidal ideation.  Heme: Denies bruising, bleeding, and enlarged lymph nodes.  Physical Exam: BP 124/85  Pulse 63  Temp 97.4 F (36.3 C) (Temporal)  Ht 5\' 4"  (1.626 m)  Wt 262 lb (118.842 kg)  BMI 44.97 kg/m2 General:   Alert and oriented. No distress noted. Pleasant and cooperative.  Head:  Normocephalic and atraumatic. Eyes:  Conjuctiva clear without scleral icterus. Mouth:  Oral mucosa pink and moist. Good dentition. No lesions. Neck:  Supple, without mass or thyromegaly. Heart:  S1, S2 present without murmurs, rubs, or gallops. Regular rate and rhythm. Abdomen:  +BS, soft, non-tender and non-distended. No rebound or guarding. Difficult to appreciate any HSM due to large body habitus.  Msk:  Symmetrical without gross deformities. Normal posture. Extremities:  Without edema. Neurologic:  Alert and  oriented x4;  grossly normal neurologically. Skin:  Intact without significant lesions or rashes. Cervical  Nodes:  No significant cervical adenopathy. Psych:  Alert and cooperative. Normal mood and affect.

## 2012-07-18 NOTE — Progress Notes (Signed)
Faxed to PCP

## 2012-07-18 NOTE — Patient Instructions (Addendum)
We have set you up for a colonoscopy with Dr. Jena Gauss in the near future.  I have provided samples of Prilosec. This is for reflux. Take one each morning, 30 minutes before the first meal of the day. If you continue to have reflux symptoms or symptoms at night, we may need to evaluate further.  Please have ultrasound completed and get the lab work done today.   Cut out diet mountain dew, fatty foods, fried foods, breads, pastas. Replace with whole wheat options.   Finally, please take the prescription to the Health Department to have your vaccinations completed.    Diet for Gastroesophageal Reflux Disease, Adult Reflux (acid reflux) is when acid from your stomach flows up into the esophagus. When acid comes in contact with the esophagus, the acid causes irritation and soreness (inflammation) in the esophagus. When reflux happens often or so severely that it causes damage to the esophagus, it is called gastroesophageal reflux disease (GERD). Nutrition therapy can help ease the discomfort of GERD. FOODS OR DRINKS TO AVOID OR LIMIT  Smoking or chewing tobacco. Nicotine is one of the most potent stimulants to acid production in the gastrointestinal tract.   Caffeinated and decaffeinated coffee and black tea.   Regular or low-calorie carbonated beverages or energy drinks (caffeine-free carbonated beverages are allowed).     Strong spices, such as black pepper, white pepper, red pepper, cayenne, curry powder, and chili powder.   Peppermint or spearmint.   Chocolate.   High-fat foods, including meats and fried foods. Extra added fats including oils, butter, salad dressings, and nuts. Limit these to less than 8 tsp per day.   Fruits and vegetables if they are not tolerated, such as citrus fruits or tomatoes.   Alcohol.   Any food that seems to aggravate your condition.  If you have questions regarding your diet, call your caregiver or a registered dietitian. OTHER THINGS THAT MAY HELP GERD  INCLUDE:    Eating your meals slowly, in a relaxed setting.   Eating 5 to 6 small meals per day instead of 3 large meals.   Eliminating food for a period of time if it causes distress.   Not lying down until 3 hours after eating a meal.   Keeping the head of your bed raised 6 to 9 inches (15 to 23 cm) by using a foam wedge or blocks under the legs of the bed. Lying flat may make symptoms worse.   Being physically active. Weight loss may be helpful in reducing reflux in overweight or obese adults.   Wear loose fitting clothing  EXAMPLE MEAL PLAN This meal plan is approximately 2,000 calories based on https://www.bernard.org/ meal planning guidelines. Breakfast   cup cooked oatmeal.   1 cup strawberries.   1 cup low-fat milk.   1 oz almonds.  Snack  1 cup cucumber slices.   6 oz yogurt (made from low-fat or fat-free milk).  Lunch  2 slice whole-wheat bread.   2 oz sliced [place].   2 tsp mayonnaise.   1 cup blueberries.   1 cup snap peas.  Snack  6 whole-wheat crackers.   1 oz string cheese.  Dinner   cup brown rice.   1 cup mixed veggies.   1 tsp olive oil.   3 oz grilled fish.  Document Released: 09/11/2005 Document Revised: 12/04/2011 Document Reviewed: 07/28/2011 [facility] Patient Information 2013 [place], [place].

## 2012-07-18 NOTE — Progress Notes (Unsigned)
Please have pt return in 3 months to assess GERD symptoms.  Started on Prilosec today at [facility]. ?EGD if no improvement or dysphagia.

## 2012-07-19 ENCOUNTER — Other Ambulatory Visit: Payer: Self-pay | Admitting: Gastroenterology

## 2012-07-19 ENCOUNTER — Ambulatory Visit (HOSPITAL_COMMUNITY)
Admission: RE | Admit: 2012-07-19 | Discharge: 2012-07-19 | Disposition: A | Discharge status: Home / Self Care | Payer: Self-pay | Source: Ambulatory Visit | Attending: Gastroenterology | Admitting: Gastroenterology

## 2012-07-19 DIAGNOSIS — R161 Splenomegaly, not elsewhere classified: Secondary | ICD-10-CM | POA: Insufficient documentation

## 2012-07-19 DIAGNOSIS — B1920 Unspecified viral hepatitis C without hepatic coma: Secondary | ICD-10-CM | POA: Insufficient documentation

## 2012-07-23 ENCOUNTER — Telehealth: Payer: Self-pay

## 2012-07-23 DIAGNOSIS — R911 Solitary pulmonary nodule: Secondary | ICD-10-CM

## 2012-07-23 NOTE — Telephone Encounter (Signed)
See note under U/S.

## 2012-07-23 NOTE — Telephone Encounter (Signed)
Pt called- left voicemail- requesting U/S results. Please advise.

## 2012-07-23 NOTE — Telephone Encounter (Signed)
Tried to call pt- LMOM 

## 2012-07-23 NOTE — Telephone Encounter (Signed)
Please see result notes.  I'd like to get CT abd, ? Cirrhosis.

## 2012-07-23 NOTE — Progress Notes (Signed)
Quick Note:  Tried to call pt- LMOM ______ 

## 2012-07-23 NOTE — Progress Notes (Signed)
Quick Note:  Notes liver with possible fatty infiltration or cirrhosis, BUT no mass noted. Enlarged spleen. Left kidney cyst, smaller than before, not an area of concern.  However, due to possible cirrhosis, I'd like to get a CT abd to further evaluate. Would need Creatinine prior. I'd like to get this done ASAP; he has a colonoscopy coming up early November. IF he does have cirrhosis, we could just tack on an EGD at time of TCS for esophageal varices screening. We would only do this if there is evidence of cirrhosis.  Thanks! ______

## 2012-07-24 ENCOUNTER — Other Ambulatory Visit (HOSPITAL_COMMUNITY): Payer: Self-pay

## 2012-07-24 ENCOUNTER — Ambulatory Visit (HOSPITAL_COMMUNITY): Payer: Self-pay

## 2012-07-24 ENCOUNTER — Encounter (HOSPITAL_COMMUNITY): Payer: Self-pay | Admitting: Pharmacy Technician

## 2012-07-25 ENCOUNTER — Ambulatory Visit (HOSPITAL_COMMUNITY)
Admission: RE | Admit: 2012-07-25 | Discharge: 2012-07-25 | Disposition: A | Discharge status: Home / Self Care | Payer: Self-pay | Source: Ambulatory Visit | Attending: Gastroenterology | Admitting: Gastroenterology

## 2012-07-25 DIAGNOSIS — R911 Solitary pulmonary nodule: Secondary | ICD-10-CM | POA: Insufficient documentation

## 2012-07-25 DIAGNOSIS — K7460 Unspecified cirrhosis of liver: Secondary | ICD-10-CM | POA: Insufficient documentation

## 2012-07-25 MED ORDER — IOHEXOL 300 MG/ML  SOLN
100.0000 mL | Freq: Once | INTRAMUSCULAR | Status: AC | PRN
  Administered 2012-07-25: 100 mL via INTRAVENOUS

## 2012-07-29 ENCOUNTER — Encounter: Payer: Self-pay | Admitting: Internal Medicine

## 2012-07-29 NOTE — Progress Notes (Signed)
Quick Note:  Pt aware.  Dawn, please cc Dr. Sherene Sires. ______

## 2012-07-29 NOTE — Progress Notes (Signed)
Quick Note:  Spoke with pt and notified of results per Dr. Wert. Pt verbalized understanding and denied any questions.  ______ 

## 2012-07-29 NOTE — Progress Notes (Signed)
Quick Note:  No CT evidence of cirrhosis, which is good.  Pulmonary nodule stable. Radiologist recommending f/u in 12-18 months.  I am copying Dr. Sherene Sires from Pulmonology on this. ______

## 2012-07-30 NOTE — Progress Notes (Signed)
Faxed to Dr Sherene Sires

## 2012-08-13 MED ORDER — SODIUM CHLORIDE 0.45 % IV SOLN: INTRAVENOUS | Status: DC

## 2012-08-14 ENCOUNTER — Telehealth: Payer: Self-pay | Admitting: Gastroenterology

## 2012-08-14 ENCOUNTER — Ambulatory Visit (HOSPITAL_COMMUNITY): Admission: RE | Admit: 2012-08-14 | Payer: Self-pay | Source: Ambulatory Visit | Admitting: Internal Medicine

## 2012-08-14 ENCOUNTER — Encounter (HOSPITAL_COMMUNITY): Admission: RE | Payer: Self-pay | Source: Ambulatory Visit

## 2012-08-14 SURGERY — COLONOSCOPY
Anesthesia: Moderate Sedation

## 2012-08-14 NOTE — Telephone Encounter (Signed)
I called to follow up with Thomas Ball regarding not showing for his procedure today and I had to [facility]

## 2012-08-15 NOTE — Progress Notes (Signed)
Reminder in epic to follow up in 3 months/GERD

## 2012-08-19 ENCOUNTER — Encounter (HOSPITAL_COMMUNITY): Payer: Self-pay | Admitting: Emergency Medicine

## 2012-08-19 ENCOUNTER — Emergency Department (HOSPITAL_COMMUNITY)
Admission: EM | Admit: 2012-08-19 | Discharge: 2012-08-19 | Disposition: A | Discharge status: Home / Self Care | Payer: Self-pay | Attending: Emergency Medicine | Admitting: Emergency Medicine

## 2012-08-19 DIAGNOSIS — G8929 Other chronic pain: Secondary | ICD-10-CM

## 2012-08-19 DIAGNOSIS — L729 Follicular cyst of the skin and subcutaneous tissue, unspecified: Secondary | ICD-10-CM

## 2012-08-19 DIAGNOSIS — X58XXXA Exposure to other specified factors, initial encounter: Secondary | ICD-10-CM | POA: Insufficient documentation

## 2012-08-19 DIAGNOSIS — Z8719 Personal history of other diseases of the digestive system: Secondary | ICD-10-CM | POA: Insufficient documentation

## 2012-08-19 DIAGNOSIS — M542 Cervicalgia: Secondary | ICD-10-CM | POA: Insufficient documentation

## 2012-08-19 DIAGNOSIS — F17200 Nicotine dependence, unspecified, uncomplicated: Secondary | ICD-10-CM | POA: Insufficient documentation

## 2012-08-19 DIAGNOSIS — S43499A Other sprain of unspecified shoulder joint, initial encounter: Secondary | ICD-10-CM | POA: Insufficient documentation

## 2012-08-19 DIAGNOSIS — Z8619 Personal history of other infectious and parasitic diseases: Secondary | ICD-10-CM | POA: Insufficient documentation

## 2012-08-19 DIAGNOSIS — L723 Sebaceous cyst: Secondary | ICD-10-CM | POA: Insufficient documentation

## 2012-08-19 DIAGNOSIS — Z85828 Personal history of other malignant neoplasm of skin: Secondary | ICD-10-CM | POA: Insufficient documentation

## 2012-08-19 DIAGNOSIS — S46819A Strain of other muscles, fascia and tendons at shoulder and upper arm level, unspecified arm, initial encounter: Secondary | ICD-10-CM

## 2012-08-19 DIAGNOSIS — Y929 Unspecified place or not applicable: Secondary | ICD-10-CM | POA: Insufficient documentation

## 2012-08-19 DIAGNOSIS — Y939 Activity, unspecified: Secondary | ICD-10-CM | POA: Insufficient documentation

## 2012-08-19 HISTORY — DX: Biliary acute pancreatitis without necrosis or infection: K85.10

## 2012-08-19 NOTE — ED Notes (Signed)
Pt states having neck pain for one month. Was seen several weeks ago for same and was given muscle relaxers with some relief. Pt states continues to have neck pain.denies other symptoms.

## 2012-08-19 NOTE — ED Provider Notes (Signed)
History  This chart was scribed for Jones Skene, MD by Shari Heritage, ED Scribe. The patient was seen in room APA12/APA12. Patient's care was started at 0909.   CSN: [id number]  Arrival date & time 08/19/12  1478   First MD Initiated Contact with Patient 08/19/12 [phone]      Chief Complaint  Patient presents with  . Neck Pain     The history is provided by the patient. No language interpreter was used.    HPI Comments: Thomas Ball is a 51 y.o. male who presents to the Emergency Department complaining of neck pain. This started one month ago presented as torticollis and patient was given muscle relaxers and his condition resolved. He's had persistent intermittent, moderate, non-radiating, left-sided neck pain and stiffness since then. Patient describes the pain as sharp when he turns his head and nagging when he is at rest. He rates the pain as 2 out of 10. Patient also reports that he has had a cyst on the left, posterior neck since 2004, but he has not been followed up for it. Patient has a similar area on his back that he says has elicited white discharge. Over the years, he has had similar spots removed on other parts of the body. Patient states that he was seen at [facility] 2 weeks ago for this neck pain and was given muscle relaxers. Patient says he took medication with mild relief, but he continues to have pain. Patient denies any other current pain or symptoms. He states that he experienced some nausea and mild abdominal pain over the weekend, but he thinks it was caused by something he ate. He took Tums and the symptoms were relieved. Patient's medical history includes GERD, skin cancer, pancreatitis and HCV. Patient is a current every day smoker.   Past Medical History  Diagnosis Date  . GERD (gastroesophageal reflux disease)   . HCV (hepatitis C virus)     Treatment: Dr. Jacqualine Mau  . Skin cancer 1992    removed from side of head  . Pancreatitis, gallstone     Past Surgical  History  Procedure Date  . Jaw broken   . Cholecystectomy   . Lipoma removals     SEVERAL    Family History  Problem Relation Age of Onset  . Colon cancer Neg Hx     History  Substance Use Topics  . Smoking status: Current Every Day Smoker -- 0.5 packs/day for 28 years    Types: Cigarettes  . Smokeless tobacco: Never Used  . Alcohol Use: No     Review of Systems At least 10pt or greater review of systems completed and are negative except where specified in the HPI.  Allergies  Review of patient's allergies indicates no known allergies.  Home Medications  No current outpatient prescriptions on file.  Triage Vitals: BP 133/73  Pulse 70  Temp 98.7 F (37.1 C) (Oral)  Resp 18  Ht 5\' 5"  (1.651 m)  Wt 258 lb (117.028 kg)  BMI 42.93 kg/m2  SpO2 95%  Physical Exam  Nursing notes reviewed.  Electronic medical record reviewed. VITAL SIGNS:   Filed Vitals:   08/19/12 0904  BP: 133/73  Pulse: 70  Temp: 98.7 F (37.1 C)  TempSrc: Oral  Resp: 18  Height: 5\' 5"  (1.651 m)  Weight: 258 lb (117.028 kg)  SpO2: 95%   CONSTITUTIONAL: Awake, oriented, appears non-toxic HENT: Atraumatic, normocephalic, oral mucosa pink and moist, airway patent. Nares patent without drainage. External ears normal.  EYES: Conjunctiva clear, EOMI, PERRLA NECK: Trachea midline, non-tender, supple. Patient has a cyst overlying the left cervical paraspinous muscles is mobile and mildly fluctuant. There is a second cyst on the right side further down towards the nape of the neck is similarly mobile and fluctuant. Neither of these are tender to palpation. CARDIOVASCULAR: Normal heart rate, Normal rhythm, No murmurs, rubs, gallops PULMONARY/CHEST: Clear to auscultation, no rhonchi, wheezes, or rales. Symmetrical breath sounds. Non-tender. ABDOMINAL: Non-distended, soft, non-tender - no rebound or guarding.  BS normal. Back: Mild tenderness to palpation along the muscle belly of the trapezius muscle  worse on the left. Patient has another cyst versus lipoma in the lumbar region on the left. This is a firm mobile nodule approximately size of a grape it he says he has lanced previously by himself and has expressed foul-smelling white discharge. NEUROLOGIC: Non-focal, moving all four extremities, no gross sensory or motor deficits. Facial sensation equal to light touch bilaterally.  Good muscle bulk in the masseter muscle and good lateral movement of the jaw.  Facial expressions equal and good strength with smile/frown and puffed cheeks.  Hearing grossly intact to finger rub test.  Uvula, tongue are midline with no deviation. Symmetrical palate elevation.  Trapezius and SCM muscles are 5/5 strength bilaterally.   EXTREMITIES: No clubbing, cyanosis, or edema SKIN: Warm, Dry, No erythema, No rash  ED Course  Procedures (including critical care time) DIAGNOSTIC STUDIES: Oxygen Saturation is 95% on room air, adequate by my interpretation.    COORDINATION OF CARE: 9:13 AM- Patient informed of current plan for treatment and evaluation and agrees with plan at this time.      Labs Reviewed - No data to display No results found.   1. Neck pain, chronic   2. Trapezius muscle strain   3. Cyst of skin       MDM  Thomas Ball is a 51 y.o. male was concerned about cysts on his neck as they could be related to persistently stiff neck. Patient works with concrete, as a physically demanding job and had torticollis about a month ago. He is not on any specific physical therapy to resolve his neck pain. Is not taking his muscle relaxants anymore as he did not like the way it made him feel. I recommended ibuprofen for discomfort as well as heat and stretching exercises for his neck. Patient does have some cysts which could be epidermoid cysts, versus lipomas in the neck, they have been there for years, I do not think they require any emergent imaging at this time. Do not think any imaging or labs are  indicated at this time. We will refer him to dermatology. Patient's cranial nerve exam is completely normal, neuro exam on the upper extremities is normal.   I personally performed the services described in this documentation, which was scribed in my presence. The recorded information has been reviewed and is accurate. Jones Skene, M.D.     Jones Skene, MD 08/19/12 [phone]

## 2012-09-16 ENCOUNTER — Telehealth: Payer: Self-pay | Admitting: Gastroenterology

## 2012-09-16 NOTE — Telephone Encounter (Signed)
Patient is a [facility] patient and I have put him on her schedule for 01/10

## 2012-09-16 NOTE — Telephone Encounter (Signed)
Pt called this morning wanting to set up OV to set up his TCS and to have his procedure done before January 11th due to his insurance. I was under the impression that [facility]MR was his doctor, but it is SF. Pt has OV on 1/8 at 0800 with KJ and I told him to start his clear liquid diet for all day on the 8th and he could get his original prep rx that was at his pharmacy from previously being set up for procedure. LAW said that [facility] could do tcs on 1/9 in the afternoon, but I found out after the phone call that this patient belongs to [facility]. Will SF be able to do tcs before January 11th?

## 2012-09-16 NOTE — Telephone Encounter (Signed)
i called pt back and left a vm that he will need to come in on the 8th, start his clear liquid diet on the 9th and SF will be doing the procedure on the 10th and if he had any questions to call the office back.

## 2012-10-01 ENCOUNTER — Ambulatory Visit (INDEPENDENT_AMBULATORY_CARE_PROVIDER_SITE_OTHER): Payer: Self-pay | Admitting: Gastroenterology

## 2012-10-01 ENCOUNTER — Encounter: Payer: Self-pay | Admitting: Gastroenterology

## 2012-10-01 ENCOUNTER — Encounter (HOSPITAL_COMMUNITY): Payer: Self-pay | Admitting: Pharmacy Technician

## 2012-10-01 VITALS — BP 126/80 | HR 74 | Temp 97.6°F | Ht 65.0 in | Wt 271.0 lb

## 2012-10-01 DIAGNOSIS — Z1211 Encounter for screening for malignant neoplasm of colon: Secondary | ICD-10-CM

## 2012-10-01 DIAGNOSIS — K219 Gastro-esophageal reflux disease without esophagitis: Secondary | ICD-10-CM

## 2012-10-01 DIAGNOSIS — B1920 Unspecified viral hepatitis C without hepatic coma: Secondary | ICD-10-CM

## 2012-10-01 MED ORDER — OMEPRAZOLE 20 MG PO CPDR: 20.0000 mg | DELAYED_RELEASE_CAPSULE | Freq: Every day | ORAL | Status: DC

## 2012-10-01 MED ORDER — PEG 3350-KCL-NA BICARB-NACL 420 G PO SOLR: 4000.0000 mL | ORAL | Status: DC

## 2012-10-01 NOTE — Patient Instructions (Addendum)
We have set you up for a colonoscopy with Dr. Darrick Penna in the near future.  For reflux: Follow the reflux diet handout. Stop sodas. Review the low-fat diet. Weight loss of 1-2 lbs per week. Exercise most days of the week for at least 45 minutes.   Start Prilosec 20 mg, 30 minutes before the first meal of the day. I have given samples and sent the prescription to your pharmacy.   Please have the vaccinations completed. I have provided the prescription as well.  You will come back in 3 months to see Dr. Darrick Penna for follow-up of reflux.   Diet for Gastroesophageal Reflux Disease, Adult Reflux (acid reflux) is when acid from your stomach flows up into the esophagus. When acid comes in contact with the esophagus, the acid causes irritation and soreness (inflammation) in the esophagus. When reflux happens often or so severely that it causes damage to the esophagus, it is called gastroesophageal reflux disease (GERD). Nutrition therapy can help ease the discomfort of GERD. FOODS OR DRINKS TO AVOID OR LIMIT  Smoking or chewing tobacco. Nicotine is one of the most potent stimulants to acid production in the gastrointestinal tract.   Caffeinated and decaffeinated coffee and black tea.   Regular or low-calorie carbonated beverages or energy drinks (caffeine-free carbonated beverages are allowed).     Strong spices, such as black pepper, white pepper, red pepper, cayenne, curry powder, and chili powder.   Peppermint or spearmint.   Chocolate.   High-fat foods, including meats and fried foods. Extra added fats including oils, butter, salad dressings, and nuts. Limit these to less than 8 tsp per day.   Fruits and vegetables if they are not tolerated, such as citrus fruits or tomatoes.   Alcohol.   Any food that seems to aggravate your condition.  If you have questions regarding your diet, call your caregiver or a registered dietitian. OTHER THINGS THAT MAY HELP GERD INCLUDE:    Eating your meals  slowly, in a relaxed setting.   Eating 5 to 6 small meals per day instead of 3 large meals.   Eliminating food for a period of time if it causes distress.   Not lying down until 3 hours after eating a meal.   Keeping the head of your bed raised 6 to 9 inches (15 to 23 cm) by using a foam wedge or blocks under the legs of the bed. Lying flat may make symptoms worse.   Being physically active. Weight loss may be helpful in reducing reflux in overweight or obese adults.   Wear loose fitting clothing  EXAMPLE MEAL PLAN This meal plan is approximately 2,000 calories based on https://www.bernard.org/ meal planning guidelines. Breakfast   cup cooked oatmeal.   1 cup strawberries.   1 cup low-fat milk.   1 oz almonds.  Snack  1 cup cucumber slices.   6 oz yogurt (made from low-fat or fat-free milk).  Lunch  2 slice whole-wheat bread.   2 oz sliced [place].   2 tsp mayonnaise.   1 cup blueberries.   1 cup snap peas.  Snack  6 whole-wheat crackers.   1 oz string cheese.  Dinner   cup brown rice.   1 cup mixed veggies.   1 tsp olive oil.   3 oz grilled fish.  Document Released: 09/11/2005 Document Revised: 12/04/2011 Document Reviewed: 07/28/2011 [facility] Patient Information 2013 [place], [place].

## 2012-10-01 NOTE — Assessment & Plan Note (Signed)
Likely due to dietary behaviors, increased weight gain. Notes choking on saliva but NO solid food dysphagia. I question symptoms secondary to uncontrolled GERD. Hold off on upper GI evaluation currently unless fails dietary/behavior modification and PPI. No solid food dysphagia or other concerning signs.  prilosec samples and prescription sent to pharmacy. 3 mos f/u with Dr. Darrick Penna for GERD

## 2012-10-01 NOTE — Assessment & Plan Note (Addendum)
Hx of Hep C, s/p treatment, now with undetectable RNA as of Aug 2013. Released from Dr. Jacqualine Mau' care. CT without evidence of cirrhosis, AFP normal. To wrap up this evaluation, just needs to complete twinrix vaccine. Rx provided.

## 2012-10-01 NOTE — Progress Notes (Signed)
Faxed to PCP

## 2012-10-01 NOTE — Progress Notes (Signed)
Primary Care Physician:  Tylene Fantasia., PA Primary Gastroenterologist: Dr. Darrick Penna (prior notes state Dr. Jena Gauss, but pt has not undergone a procedure by either physician. Due to time constraints with insurance, he is requesting with Dr. Darrick Penna for a colonoscopy).   Chief Complaint  Patient presents with  . Follow-up    HPI:   52 year old male with hx of Hep C, genotype 3a, seen originally by myself in March 2012 for initial consult. He has undergone treatment under the supervision of Dr. Jacqualine Mau, starting Aug 2012. As of Aug 2013, HCV RNA undetectable; he has since been discharged from Dr. Jacqualine Mau' care due to completion of treatment and negative RNA. Hep A and B vaccinations: hasn't had completed yet. Was given Twinrx prescription at last appt. States was accidentally thrown away along with the Prilosec samples we had given. States never took the Prilosec. States once or twice a week has reflux, moreso in the evenings. Notes choking/coughing sometimes with saliva. Aggravating at times. No issues with solid food. Drinks sodas, chocolate, caffeine. Gained weight over Christmas.   Needs first ever initial screening colonoscopy. Last AFP was 5.2 in October 2013. CT Oct 2013 without evidence for cirrhosis. No constipation, diarrhea. No rectal bleeding.    Past Medical History  Diagnosis Date  . GERD (gastroesophageal reflux disease)   . HCV (hepatitis C virus)     Treatment: Dr. Jacqualine Mau  . Skin cancer 1992    removed from side of head  . Pancreatitis, gallstone     Past Surgical History  Procedure Date  . Jaw broken   . Cholecystectomy   . Lipoma removals     SEVERAL    Current Outpatient Prescriptions  Medication Sig Dispense Refill  . ibuprofen (ADVIL,MOTRIN) 200 MG tablet Take 200 mg by mouth every 6 (six) hours as needed.        Allergies as of 10/01/2012  . (No Known Allergies)    Family History  Problem Relation Age of Onset  . Colon cancer Neg Hx     History   Social  History  . Marital Status: Divorced    Spouse Name: N/A    Number of Children: N/A  . Years of Education: N/A   Occupational History  . concrete work    Social History Main Topics  . Smoking status: Current Every Day Smoker -- 0.5 packs/day for 28 years    Types: Cigarettes  . Smokeless tobacco: Never Used  . Alcohol Use: No  . Drug Use: No  . Sexually Active: None   Other Topics Concern  . None   Social History Narrative  . None    Review of Systems: Negative unless otherwise mentioned in HPI.   Physical Exam: BP 126/80  Pulse 74  Temp 97.6 F (36.4 C) (Oral)  Ht 5\' 5"  (1.651 m)  Wt 271 lb (122.925 kg)  BMI 45.10 kg/m2 General:   Alert and oriented. No distress noted. Pleasant and cooperative.  Head:  Normocephalic and atraumatic. Eyes:  Conjuctiva clear without scleral icterus. Mouth:  Oral mucosa pink and moist. Good dentition. No lesions. Neck:  Supple, without mass or thyromegaly. Heart:  S1, S2 present without murmurs, rubs, or gallops. Regular rate and rhythm. Abdomen:  +BS, soft, obese, non-tender and non-distended. No rebound or guarding. No HSM or masses noted. Msk:  Symmetrical without gross deformities. Normal posture. Extremities:  Without edema. Neurologic:  Alert and  oriented x4;  grossly normal neurologically. Skin:  Intact without significant lesions or rashes. Cervical  Nodes:  No significant cervical adenopathy. Psych:  Alert and cooperative. Normal mood and affect.

## 2012-10-01 NOTE — Assessment & Plan Note (Signed)
52 year old male due for initial average risk screening colonoscopy. No lower GI symptoms currently.   Proceed with colonoscopy with Dr. Darrick Penna in the near future. The risks, benefits, and alternatives have been discussed in detail with the patient. They state understanding and desire to proceed.

## 2012-10-02 ENCOUNTER — Ambulatory Visit: Payer: Self-pay | Admitting: Gastroenterology

## 2012-10-04 ENCOUNTER — Ambulatory Visit (HOSPITAL_COMMUNITY)
Admission: RE | Admit: 2012-10-04 | Discharge: 2012-10-04 | Disposition: A | Discharge status: Home / Self Care | Payer: Self-pay | Source: Ambulatory Visit | Attending: Gastroenterology | Admitting: Gastroenterology

## 2012-10-04 ENCOUNTER — Encounter (HOSPITAL_COMMUNITY): Payer: Self-pay

## 2012-10-04 ENCOUNTER — Encounter (HOSPITAL_COMMUNITY)
Admission: RE | Disposition: A | Discharge status: Home / Self Care | Payer: Self-pay | Source: Ambulatory Visit | Attending: Gastroenterology

## 2012-10-04 DIAGNOSIS — K5730 Diverticulosis of large intestine without perforation or abscess without bleeding: Secondary | ICD-10-CM

## 2012-10-04 DIAGNOSIS — Z1211 Encounter for screening for malignant neoplasm of colon: Secondary | ICD-10-CM | POA: Insufficient documentation

## 2012-10-04 DIAGNOSIS — D126 Benign neoplasm of colon, unspecified: Secondary | ICD-10-CM | POA: Insufficient documentation

## 2012-10-04 DIAGNOSIS — Z1212 Encounter for screening for malignant neoplasm of rectum: Secondary | ICD-10-CM

## 2012-10-04 DIAGNOSIS — D128 Benign neoplasm of rectum: Secondary | ICD-10-CM | POA: Insufficient documentation

## 2012-10-04 DIAGNOSIS — K621 Rectal polyp: Secondary | ICD-10-CM

## 2012-10-04 DIAGNOSIS — K620 Anal polyp: Secondary | ICD-10-CM

## 2012-10-04 HISTORY — PX: COLONOSCOPY: SHX5424

## 2012-10-04 SURGERY — COLONOSCOPY
Anesthesia: Moderate Sedation

## 2012-10-04 MED ORDER — MIDAZOLAM HCL 5 MG/5ML IJ SOLN
INTRAMUSCULAR | Status: DC | PRN
  Administered 2012-10-04 (×2): 2 mg via INTRAVENOUS

## 2012-10-04 MED ORDER — MEPERIDINE HCL 100 MG/ML IJ SOLN
INTRAMUSCULAR | Status: AC
  Filled 2012-10-04: qty 2

## 2012-10-04 MED ORDER — MEPERIDINE HCL 100 MG/ML IJ SOLN
INTRAMUSCULAR | Status: DC | PRN
  Administered 2012-10-04: 25 mg via INTRAVENOUS
  Administered 2012-10-04: 50 mg via INTRAVENOUS

## 2012-10-04 MED ORDER — MIDAZOLAM HCL 5 MG/5ML IJ SOLN
INTRAMUSCULAR | Status: AC
  Filled 2012-10-04: qty 10

## 2012-10-04 MED ORDER — SODIUM CHLORIDE 0.45 % IV SOLN
INTRAVENOUS | Status: DC
  Administered 2012-10-04: 10:00:00 via INTRAVENOUS

## 2012-10-04 MED ORDER — STERILE WATER FOR IRRIGATION IR SOLN
Status: DC | PRN
  Administered 2012-10-04: 11:00:00

## 2012-10-04 NOTE — H&P (Signed)
  Primary Care Physician:  Tylene Fantasia., PA Primary Gastroenterologist:  Dr. Darrick Penna  Pre-Procedure History & Physical: HPI:  Thomas Ball is a 52 y.o. male here for COLON CANCER SCREENING.  Past Medical History  Diagnosis Date  . GERD (gastroesophageal reflux disease)   . HCV (hepatitis C virus)     Treatment: Dr. Jacqualine Mau  . Skin cancer 1992    removed from side of head  . Pancreatitis, gallstone     Past Surgical History  Procedure Date  . Jaw broken   . Cholecystectomy   . Lipoma removals     SEVERAL    Prior to Admission medications   Medication Sig Start Date End Date Taking? Authorizing Provider  ibuprofen (ADVIL,MOTRIN) 200 MG tablet Take 200 mg by mouth every 6 (six) hours as needed. For headache   Yes Historical Provider, MD    Allergies as of 10/01/2012  . (No Known Allergies)    Family History  Problem Relation Age of Onset  . Colon cancer Neg Hx     History   Social History  . Marital Status: Divorced    Spouse Name: N/A    Number of Children: N/A  . Years of Education: N/A   Occupational History  . concrete work    Social History Main Topics  . Smoking status: Current Every Day Smoker -- 0.5 packs/day for 28 years    Types: Cigarettes  . Smokeless tobacco: Never Used  . Alcohol Use: No  . Drug Use: Yes     Comment: in the 1980s, smoke crack. None since Dec 2011.   Marland Kitchen Sexually Active: Not on file   Other Topics Concern  . Not on file   Social History Narrative  . No narrative on file    Review of Systems: See HPI, otherwise negative ROS   Physical Exam: BP 113/79  Pulse 73  Temp 98.6 F (37 C) (Oral)  Resp 16  SpO2 95% General:   Alert,  pleasant and cooperative in NAD Head:  Normocephalic and atraumatic. Neck:  Supple; Lungs:  Clear throughout to auscultation.    Heart:  Regular rate and rhythm. Abdomen:  Soft, nontender and nondistended. Normal bowel sounds, without guarding, and without rebound.   Neurologic:  Alert  and  oriented x4;  grossly normal neurologically.  Impression/Plan:     SCREENING  Plan:  1. TCS TODAY

## 2012-10-04 NOTE — Op Note (Signed)
[facility] [address] [place], [address]   COLONOSCOPY PROCEDURE REPORT  PATIENT: Thomas Ball, Thomas Ball  MR#: [id number] BIRTHDATE: 06/19/1961 , 51  yrs. old GENDER: Male ENDOSCOPIST: Jonette Eva, MD REFERRED NW:GNFAOZHY Muse, PA PROCEDURE DATE:  10/04/2012 PROCEDURE:   Colonoscopy with snare polypectomy and Colonoscopy with cold biopsy polypectomy INDICATIONS:Average risk patient for colon cancer. MEDICATIONS: Demerol 75 mg IV and Versed 4 mg IV  DESCRIPTION OF PROCEDURE:    Physical exam was performed.  Informed consent was obtained from the patient after explaining the benefits, risks, and alternatives to procedure.  The patient was connected to monitor and placed in left lateral position. Continuous oxygen was provided by nasal cannula and IV medicine administered through an indwelling cannula.  After administration of sedation and rectal exam, the patients rectum was intubated and the EC-[id number] ([id number])  colonoscope was advanced under direct visualization to the cecum.  The scope was removed slowly by carefully examining the color, texture, anatomy, and integrity mucosa on the way out.  The patient was recovered in endoscopy and discharged home in satisfactory condition.    COLON FINDINGS: Eight smooth sessile polyps measuring 3-8 mm in size were found at the cecum(1), in the descending colon(1), sigmoid colon(4), and rectum(3).  A polypectomy was performed using snare cautery and with cold forceps.  , Moderate diverticulosis was noted in the descending colon and sigmoid colon.  , and Small internal hemorrhoids were found.  REDUNDANT TRANSVERSE COLON.  PREP QUALITY: good. CECAL W/D TIME: 25 minutes COMPLICATIONS: None  ENDOSCOPIC IMPRESSION: 1.   Eight sessile polyps measuring 3-8 mm in size were found at the cecum, in the descending colon, sigmoid colon, and rectum; polypectomy was performed using snare cautery and with cold forceps  2.    Moderate diverticulosis was noted in the descending colon and sigmoid colon 3.   Small internal hemorrhoids  RECOMMENDATIONS: AWAIT BIOPSY HIGH FIBER DIET CONTINUE WEIGHT LOSS EFFORTS TCS IN 5 YEARS WITH AN OVERTUBE       _______________________________ eSignedJonette Eva, MD 10/04/2012 11:42 AM     PATIENT NAME:  Thomas Ball, Thomas Ball MR#: [id number]

## 2012-10-08 ENCOUNTER — Encounter (HOSPITAL_COMMUNITY): Payer: Self-pay | Admitting: Gastroenterology

## 2012-10-10 ENCOUNTER — Telehealth: Payer: Self-pay | Admitting: Gastroenterology

## 2012-10-10 NOTE — Telephone Encounter (Signed)
LMOM to call.

## 2012-10-10 NOTE — Telephone Encounter (Signed)
Please call pt. He had A simple adenoma removed from hIS colon. THE OTHER POLYPS WERE HYPERPLASTIC.  HE SHOULD FOLLOW A High fiber diet. BECAUSE ONLY ONE SIMPLE ADENOMA WAS REMOVED HE MAY WAIT 10 YEARS TO HAVE ANOTHER COLONOSCOPY.

## 2012-10-10 NOTE — Telephone Encounter (Signed)
Path faxed to PCP, recall made 

## 2012-10-10 NOTE — Telephone Encounter (Signed)
Pt returned call and was informed.  

## 2012-12-01 NOTE — Progress Notes (Signed)
TCS JAN 2014 1/7 SIMPLE ADENOMAS.  REVIEWED.

## 2012-12-23 ENCOUNTER — Encounter: Payer: Self-pay | Admitting: Gastroenterology

## 2012-12-24 ENCOUNTER — Telehealth: Payer: Self-pay

## 2012-12-24 NOTE — Telephone Encounter (Signed)
Pt left Vm that he will not be able to come for appt tomorrow and he needs to reschedule.

## 2012-12-25 ENCOUNTER — Ambulatory Visit: Payer: Self-pay | Admitting: Gastroenterology

## 2012-12-25 NOTE — Telephone Encounter (Signed)
Pt is aware of OV on 5/22 at 3 with SF

## 2013-02-13 ENCOUNTER — Ambulatory Visit: Payer: Self-pay | Admitting: Gastroenterology

## 2013-03-09 DIAGNOSIS — R079 Chest pain, unspecified: Secondary | ICD-10-CM

## 2013-03-19 DIAGNOSIS — R079 Chest pain, unspecified: Secondary | ICD-10-CM

## 2013-07-28 ENCOUNTER — Telehealth: Payer: Self-pay | Admitting: *Deleted

## 2013-07-28 DIAGNOSIS — R911 Solitary pulmonary nodule: Secondary | ICD-10-CM

## 2013-07-28 NOTE — Telephone Encounter (Signed)
Message copied by Christen Butter on Mon Jul 28, 2013  5:08 PM ------      Message from: Sandrea Hughs B      Created: Mon Jul 29, 2012  2:55 PM       Needs ct chest no contrast limted to RLL nodule ------

## 2013-07-28 NOTE — Telephone Encounter (Signed)
Spoke with the pt and notified that he is due for ct chest  He verbalized understanding  Order was sent to [facility]

## 2013-07-29 ENCOUNTER — Telehealth: Payer: Self-pay

## 2013-07-29 NOTE — Telephone Encounter (Signed)
I told Thomas Ball that he needed to come by and fill out papers for [facility], he needed to bring 3 months of bank statements, 3 years of tax returns and documentation of his wife's disability income.  I also made him aware that the Hep C Clinic did not close it is just under new management thorough [facility] based out of [place], [place]  He stated he will be by on Thursday to complete financial forms.

## 2013-07-29 NOTE — Telephone Encounter (Signed)
Pt called. He said the Hep C Clinic closed down in [place], shortly before he completed his Hep C treatments. He would like to talk to someone about getting signed up for [facility] benefits again and what he needs to do. He said he was told to call here since he has been seen here. He declined to schedule an OV at this time since he does not have [employer] benefits until her reapplies. Routing to [employer] for recommendations.

## 2013-07-31 ENCOUNTER — Other Ambulatory Visit: Payer: Self-pay

## 2013-08-18 NOTE — Telephone Encounter (Signed)
I instructed Thomas Ball he needed to bring his 2013 tax return by in order to complete his financial application. He stated he would bring them on Wednesday of this week.

## 2013-08-19 ENCOUNTER — Other Ambulatory Visit: Payer: Self-pay

## 2013-08-26 ENCOUNTER — Telehealth: Payer: Self-pay | Admitting: Gastroenterology

## 2013-08-26 NOTE — Telephone Encounter (Signed)
Please call patient back at [phone]. He was following up on his assistance papers and had questions about his CT.

## 2013-08-27 ENCOUNTER — Other Ambulatory Visit: Payer: Self-pay

## 2013-08-27 NOTE — Telephone Encounter (Signed)
I confirmed from [place], rep with financial assistance that Mr. Graca is approved at [employer]% [place] Assistance thru 02/25/2014.   I called Mr. Sample and lmom

## 2013-09-03 ENCOUNTER — Other Ambulatory Visit: Payer: Self-pay

## 2013-09-09 ENCOUNTER — Ambulatory Visit (INDEPENDENT_AMBULATORY_CARE_PROVIDER_SITE_OTHER)
Admission: RE | Admit: 2013-09-09 | Discharge: 2013-09-09 | Disposition: A | Discharge status: Home / Self Care | Payer: No Typology Code available for payment source | Source: Ambulatory Visit | Attending: Internal Medicine | Admitting: Internal Medicine

## 2013-09-09 DIAGNOSIS — R911 Solitary pulmonary nodule: Secondary | ICD-10-CM

## 2013-09-10 ENCOUNTER — Telehealth: Payer: Self-pay | Admitting: Internal Medicine

## 2013-09-10 ENCOUNTER — Encounter: Payer: Self-pay | Admitting: Internal Medicine

## 2013-09-10 NOTE — Telephone Encounter (Signed)
Notes Recorded by Hermelinda Medicus on 09/10/2013 at 11:59 AM Attempted to call x1 LMTCB ------  Notes Recorded by Nyoka Cowden, MD on 09/10/2013 at 10:07 AM Call patient : Study is unremarkable, no change in nodule x 20 m > no need for further cts Pt advised.Carron Curie, CMA

## 2013-10-09 ENCOUNTER — Ambulatory Visit: Payer: Self-pay | Admitting: Gastroenterology

## 2013-11-01 ENCOUNTER — Emergency Department (HOSPITAL_COMMUNITY): Payer: No Typology Code available for payment source

## 2013-11-01 ENCOUNTER — Encounter (HOSPITAL_COMMUNITY): Payer: Self-pay | Admitting: Emergency Medicine

## 2013-11-01 ENCOUNTER — Emergency Department (HOSPITAL_COMMUNITY)
Admission: EM | Admit: 2013-11-01 | Discharge: 2013-11-01 | Disposition: A | Discharge status: Home / Self Care | Payer: No Typology Code available for payment source | Attending: Emergency Medicine | Admitting: Emergency Medicine

## 2013-11-01 DIAGNOSIS — Z8719 Personal history of other diseases of the digestive system: Secondary | ICD-10-CM | POA: Insufficient documentation

## 2013-11-01 DIAGNOSIS — Z8619 Personal history of other infectious and parasitic diseases: Secondary | ICD-10-CM | POA: Insufficient documentation

## 2013-11-01 DIAGNOSIS — R079 Chest pain, unspecified: Secondary | ICD-10-CM | POA: Insufficient documentation

## 2013-11-01 DIAGNOSIS — F17200 Nicotine dependence, unspecified, uncomplicated: Secondary | ICD-10-CM | POA: Insufficient documentation

## 2013-11-01 DIAGNOSIS — Z85828 Personal history of other malignant neoplasm of skin: Secondary | ICD-10-CM | POA: Insufficient documentation

## 2013-11-01 LAB — BASIC METABOLIC PANEL
BUN: 14 mg/dL (ref 6–23)
CO2: 23 mEq/L (ref 19–32)
Calcium: 9.6 mg/dL (ref 8.4–10.5)
Chloride: 102 mEq/L (ref 96–112)
Creatinine, Ser: 0.81 mg/dL (ref 0.50–1.35)
GFR calc Af Amer: >90 mL/min (ref >90)
GFR calc non Af Amer: >90 mL/min (ref >90)
Glucose, Bld: 134 mg/dL — ABNORMAL HIGH (ref 70–99)
Potassium: 3.8 mEq/L (ref 3.7–5.3)
Sodium: 139 mEq/L (ref 137–147)

## 2013-11-01 LAB — CBC WITH DIFFERENTIAL/PLATELET
Basophils Absolute: 0 10*3/uL (ref 0.0–0.1)
Basophils Relative: 0 % (ref 0–1)
Eosinophils Absolute: 0.1 10*3/uL (ref 0.0–0.7)
Eosinophils Relative: 1 % (ref 0–5)
HCT: 45 % (ref 39.0–52.0)
Hemoglobin: 16 g/dL (ref 13.0–17.0)
Lymphocytes Relative: 24 % (ref 12–46)
Lymphs Abs: 1.7 10*3/uL (ref 0.7–4.0)
MCH: 33.1 pg (ref 26.0–34.0)
MCHC: 35.6 g/dL (ref 30.0–36.0)
MCV: 93 fL (ref 78.0–100.0)
Monocytes Absolute: 0.4 10*3/uL (ref 0.1–1.0)
Monocytes Relative: 6 % (ref 3–12)
Neutro Abs: 5 10*3/uL (ref 1.7–7.7)
Neutrophils Relative %: 69 % (ref 43–77)
Platelets: 171 10*3/uL (ref 150–400)
RBC: 4.84 MIL/uL (ref 4.22–5.81)
RDW: 13.3 % (ref 11.5–15.5)
WBC: 7.2 10*3/uL (ref 4.0–10.5)

## 2013-11-01 LAB — TROPONIN I
Troponin I: <0.3 ng/mL (ref <0.30)
Troponin I: <0.3 ng/mL (ref <0.30)

## 2013-11-01 MED ORDER — ASPIRIN 81 MG PO CHEW
324.0000 mg | CHEWABLE_TABLET | Freq: Once | ORAL | Status: AC
  Administered 2013-11-01: 324 mg via ORAL
  Filled 2013-11-01: qty 4

## 2013-11-01 NOTE — ED Notes (Signed)
Dr Kohut at bedside,  

## 2013-11-01 NOTE — ED Notes (Signed)
Pt talking on phone, updated on plan of care, states that he is still having pain described as uncomfortable,

## 2013-11-01 NOTE — ED Notes (Signed)
C/o chest pain onset about 2.5 hours ago

## 2013-11-01 NOTE — ED Notes (Signed)
Pt c/o left side chest pain that woke pt up from sleep this afternoon around 1pm, states that he has had this pain before and was admitted to [facility], has had stress test and a halter monitor performed with no diagnosis, pt states that he did not have any money and was not able to follow up with cardiologists,

## 2013-11-01 NOTE — Discharge Instructions (Signed)
Chest Pain (Nonspecific) °It is often hard to give a specific diagnosis for the cause of chest pain. There is always a chance that your pain could be related to something serious, such as a heart attack or a blood clot in the lungs. You need to follow up with your caregiver for further evaluation. °CAUSES  °· Heartburn. °· Pneumonia or bronchitis. °· Anxiety or stress. °· Inflammation around your heart (pericarditis) or lung (pleuritis or pleurisy). °· A blood clot in the lung. °· A collapsed lung (pneumothorax). It can develop suddenly on its own (spontaneous pneumothorax) or from injury (trauma) to the chest. °· Shingles infection (herpes zoster virus). °The chest wall is composed of bones, muscles, and cartilage. Any of these can be the source of the pain. °· The bones can be bruised by injury. °· The muscles or cartilage can be strained by coughing or overwork. °· The cartilage can be affected by inflammation and become sore (costochondritis). °DIAGNOSIS  °Lab tests or other studies, such as X-rays, electrocardiography, stress testing, or cardiac imaging, may be needed to find the cause of your pain.  °TREATMENT  °· Treatment depends on what may be causing your chest pain. Treatment may include: °· Acid blockers for heartburn. °· Anti-inflammatory medicine. °· Pain medicine for inflammatory conditions. °· Antibiotics if an infection is present. °· You may be advised to change lifestyle habits. This includes stopping smoking and avoiding alcohol, caffeine, and chocolate. °· You may be advised to keep your head raised (elevated) when sleeping. This reduces the chance of acid going backward from your stomach into your esophagus. °· Most of the time, nonspecific chest pain will improve within 2 to 3 days with rest and mild pain medicine. °HOME CARE INSTRUCTIONS  °· If antibiotics were prescribed, take your antibiotics as directed. Finish them even if you start to feel better. °· For the next few days, avoid physical  activities that bring on chest pain. Continue physical activities as directed. °· Do not smoke. °· Avoid drinking alcohol. °· Only take over-the-counter or prescription medicine for pain, discomfort, or fever as directed by your caregiver. °· Follow your caregiver's suggestions for further testing if your chest pain does not go away. °· Keep any follow-up appointments you made. If you do not go to an appointment, you could develop lasting (chronic) problems with pain. If there is any problem keeping an appointment, you must call to reschedule. °SEEK MEDICAL CARE IF:  °· You think you are having problems from the medicine you are taking. Read your medicine instructions carefully. °· Your chest pain does not go away, even after treatment. °· You develop a rash with blisters on your chest. °SEEK IMMEDIATE MEDICAL CARE IF:  °· You have increased chest pain or pain that spreads to your arm, neck, jaw, back, or abdomen. °· You develop shortness of breath, an increasing cough, or you are coughing up blood. °· You have severe back or abdominal pain, feel nauseous, or vomit. °· You develop severe weakness, fainting, or chills. °· You have a fever. °THIS IS AN EMERGENCY. Do not wait to see if the pain will go away. Get medical help at once. Call your local emergency services (911 in [place] Do not drive yourself to the hospital. °MAKE SURE YOU:  °· Understand these instructions. °· Will watch your condition. °· Will get help right away if you are not doing well or get worse. °Document Released: 06/21/2005 Document Revised: 12/04/2011 Document Reviewed: 04/16/2008 °ExitCare® Patient Information ©2014 [employer],   LLC. ° ° ° °Emergency Department Resource Guide °1) Find a Doctor and Pay Out of Pocket °Although you won't have to find out who is covered by your insurance plan, it is a good idea to ask around and get recommendations. You will then need to call the office and see if the doctor you have chosen will accept you as a new  patient and what types of options they offer for patients who are self-pay. Some doctors offer discounts or will set up payment plans for their patients who do not have insurance, but you will need to ask so you aren't surprised when you get to your appointment. ° °2) Contact Your Local Health Department °Not all health departments have doctors that can see patients for sick visits, but many do, so it is worth a call to see if yours does. If you don't know where your local health department is, you can check in your phone book. The CDC also has a tool to help you locate your state's health department, and many state websites also have listings of all of their local health departments. ° °3) Find a Walk-in Clinic °If your illness is not likely to be very severe or complicated, you may want to try a walk in clinic. These are popping up all over the country in pharmacies, drugstores, and shopping centers. They're usually staffed by nurse practitioners or physician assistants that have been trained to treat common illnesses and complaints. They're usually fairly quick and inexpensive. However, if you have serious medical issues or chronic medical problems, these are probably not your best option. ° °No Primary Care Doctor: °- Call [facility] at  832-8000 - they can help you locate a primary care doctor that  accepts your insurance, provides certain services, etc. °- Physician Referral Service- 1-800-533-3463 ° °Chronic Pain Problems: °Organization         Address  Phone   Notes  °[facility]  (336) 297-2271 Patients need to be referred by their primary care doctor.  ° °Medication Assistance: °Organization         Address  Phone   Notes  °[place] [facility] [address] °[place], [place] [address] (336) 641-8030 --Must be a resident of [place] °-- Must have NO insurance coverage whatsoever (no Medicaid/ Medicare, etc.) °-- The pt. MUST have a primary  care doctor that directs their care regularly and follows them in the community °  °[facility]  (866) 331-1348   °[employer]  (888) 892-1162   ° °Agencies that provide inexpensive medical care: °Organization         Address  Phone   Notes  °[facility]  (336) 832-8035   °[facility]    (336) 832-7272   °[facility] [address] °[place],  [address] (336) 832-4777   °[facility] [address], °[place] (336) 271-4999   °[facility]    (336) 373-0678   °[facility]    (336) 272-1050   °[facility] ° [address], [place] Phone:  (336) 832-4444, Fax:  (336) 832-4440 Hours of Operation:  9 am - 6 pm, M-F.  Also accepts Medicaid/Medicare and self-pay.  °[facility] ° [address],  Phone: (336) 832-3150, Fax: (336) 832-3151. Hours of Operation:  8:30 am - 5:30 pm, M-F.  Also accepts Medicaid and self-pay.  °  [facility] [place] [address], [place] Phone: (336) 878-6027   °[facility] [address], [place], [place] (336)723-1848, Ext. 123 Mondays & Thursdays: 7-9 AM.  First 15 patients are seen on a first come, first serve basis. °  ° °Medicaid-accepting [place] Providers: ° °Organization         Address  Phone   Notes  °[facility] [address], [place] (336) 641-2100 Also accepts self-pay patients.  °[facility] [address], [place] ° (336) 856-9996   °[facility] [address], [place] (336) 288-8857   °[facility] [address] [place] [address], [place] (336) 299-7000   °Veita Bland [address], [place]  ° (336) 373-1557 Only accepts [place] Access Medicaid patients after they have their name applied to their card.  ° °Self-Pay (no insurance) in [place]: ° °Organization         Address  Phone   Notes  °Sickle Cell Patients, [facility] [address], [place] (336) 832-1970   °[facility] [address], [place] (336) 832-4400   °[facility] ° [address], [place] (336) 992-4800   °[facility]/Dr. Osei-Bonsu ° [address] [place] [address], [place] or [address], [place] (336) 841-8500 Phone number for both [place] and [place] locations is the same.  °[facility] [address], [place] (336) 299-0000   °[facility] [address] [place] [address], [place] or [address] (336) 852-7530 °(336) 878-2260   °[facility] [address], [place] (336) 350-1642, phone; (336) 294-5005, fax Sees patients 1st and 3rd Saturday of every month.  Must not qualify for public or private insurance (i.e. Medicaid, Medicare, [place] Health Choice, Veterans' Benefits) • Household income should be no more than 200% of the poverty level •The clinic cannot treat you if you are pregnant or think you are pregnant • Sexually transmitted diseases are not treated at the clinic.  ° ° °Dental Care: °Organization         Address  Phone  Notes  °[facility] [address], [place] (336) 641-6152 Accepts children up to age 21 who are enrolled in Medicaid or [place] Health Choice; pregnant women with a Medicaid card; and children who have applied for Medicaid or [place] Health Choice, but were declined, whose parents can pay a reduced fee at time of service.  °[facility] [place]  [address], [place] (336) 641-7733 Accepts children up to age 21 who are enrolled in Medicaid or [place] Health Choice; pregnant women with a Medicaid card; and children who have applied for Medicaid or [place] Health Choice, but were declined, whose parents can  pay a reduced fee at time of service.  °[facility] ° [address], [place] (336) 641-4533 Patients are seen by appointment only. Walk-ins are not accepted. [facility] will see patients 18 years of age and older. °Monday - Tuesday (8am-5pm) °Most Wednesdays (8:30-5pm) °$30 per visit, cash only  °[facility] ° [address], [place] (336) 641-4533 Patients are seen by appointment only. Walk-ins are not accepted. [facility] will see patients 18 years of   age and older. °One Wednesday Evening (Monthly: Volunteer Based).  $30 per visit, cash only  °[facility]  (919) 537-3737 for adults; Children under age 4, call Graduate Pediatric Dentistry at (919) [phone]. Children aged 4-14, please call (919) [phone] to request a pediatric application. ° Dental services are provided in all areas of dental care including fillings, crowns and bridges, complete and partial dentures, implants, gum treatment, root canals, and extractions. Preventive care is also provided. Treatment is provided to both adults and children. °Patients are selected via a lottery and there is often a waiting list. °  °[facility] [address], °[place] ° (336) 763-8833 [email or web address] °  °[facility] [address], [place], [place] (336)723-1848, Ext. 123 Second and Fourth Thursday of each month, opens at 6:30 AM; Clinic ends at 9 AM.  Patients are seen on a first-come first-served basis, and a limited number are seen during each clinic.  ° °[facility] ° [address], [place], [place] (336) 723-7904   Eligibility Requirements °You must have lived in [place], [place], or [place] counties for at least the last three months. °  You cannot be eligible for state or federal sponsored healthcare insurance, including Veterans Administration, Medicaid, or Medicare. °  You generally cannot be eligible for healthcare  insurance through your employer.  °  How to apply: °Eligibility screenings are held every Tuesday and Wednesday afternoon from 1:00 pm until 4:00 pm. You do not need an appointment for the interview!  °[facility] [address], [place], [place] 336-631-2330   °[facility]  336-342-8273   °[place]  336-703-3100   ° [facility]  336-570-6415   ° °Behavioral Health Resources in the Community: °Intensive Outpatient Programs °Organization         Address  Phone  Notes  °[facility] [address], [place], [place] 336-878-6098   °[facility] [address], [place], [place] 336-832-9800   °[facility] [address], [place], [place] ° 336-882-2125   °[facility] [address],  °[place], [place] 1-800-853-5163 or 336-641-4981   °Substance Abuse Resources °Organization         Address  Phone  Notes  °[facility]  336-882-2125   °[facility]  336-784-9470   °[facility]  336-285-9073   °[facility]  336-845-3988   °Residential & Outpatient Substance Abuse Program  1-800-659-3381   °Psychological Services °Organization         Address  Phone  Notes  °[facility]  336- 832-9600   °[facility]  336- 378-7881   °[facility] [address], [place] 1-800-853-5163 or 336-641-4981   ° °Mobile Crisis Teams °Organization         Address  Phone  Notes  °[facility], Mobile Crisis Care Unit  1-877-626-1772   °Assertive °[facility] ° [address] [place], [place] 336-834-9664   °Sharon DeEsch [address] °[place] 336-554-5454   ° °Self-Help/Support Groups °Organization         Address  Phone             Notes  °[facility] [place] - variety of support groups  336- 373-1402 Call for more information  °Narcotics Anonymous (NA),  [facility] [address], °[place]  2   meetings at this location  ° °Residential Treatment Programs °Organization         Address  Phone  Notes  °[facility] [address],    °[place]  1-866-801-8205   °[facility] ° [address], [place], [place] 704-293-8524   °[facility] [address], [place] 336-845-3988 Admissions: 8am-3pm M-F  °[facility] [address]    °[place], [place] 336-841-1104   °[facility] [address], [place], [place] 336-379-7146   °[facility] [address]  °[place], [place] 336-285-9073   °[facility] - Intensive Outpatient [address], [place], [place] 336-852-3033   °[facility] [address]  °[place], [place] 1-877-615-2722 or 336-784-9470   °[facility] [address] [place], [place] 336-227-7417 Accepts Medicaid  °[facility] [address]  °[place] 1-800-659-3381 Substance Abuse/Addiction Treatment  ° °[place] Behavioral Health Resources °Organization         Address  Phone  Notes  °[facility]  (888) 581-9988   °Julie Brannon, PhD [address] [place], [place]   (336) 349-5553 or (336) 951-0000   °[facility]   [address] °[place], [place] (336) 349-4454   °[facility] [address], [place], [place] (336) 342-8316 Insurance/Medicaid/sponsorship through [facility]  °[facility] [address]                                    [place], [place] (336) 342-8316 Therapy/tele-psych/case  °[facility] [address]  ° [place], [place] (336) 349-2233    °Dr. Arfeen  (336) 349-4544   °[facility] [place]  [employer] [place] [facility] 1) [address],  °2) [address], [place] °3)  [address], [place] (336) 349-3220 °(336) 342-7768 ° °(336) 342-8140    °[place] [facility] (336) 342-1394 or (336) 342-3537 (After Hours)    ° ° ° °

## 2013-11-01 NOTE — ED Notes (Signed)
Patient with no complaints at this time. Respirations even and unlabored. Skin warm/dry. Discharge instructions reviewed with patient at this time. Patient given opportunity to voice concerns/ask questions. IV removed per policy and band-aid applied to site. Patient discharged at this time and left Emergency Department with steady gait.  

## 2013-11-01 NOTE — ED Provider Notes (Signed)
CSN: [id number]     Arrival date & time 11/01/13  1600 History   First MD Initiated Contact with Patient 11/01/13 1626     Chief Complaint  Patient presents with  . Chest Pain   (Consider location/radiation/quality/duration/timing/severity/associated sxs/prior Treatment) HPI  52yM with CP. L anterior chest to L axillary region. Onset while at rest around 1300 today. Woke him up from sleep. Constant ache with sharper pain with deep inspiration. No other appreciable exacerbating or relieving factors including exertion. No cough. No fever or chills. No sob. No palpitations or diaphoresis. No known history of coronary artery disease. Is a smoker. Reports history of similar pain with previous evaluations admission to an outside hospital. As far as he is aware, these workups have been unremarkable. No past history of DVT/PE. No recent immobilization. No recent surgeries.  Past Medical History  Diagnosis Date  . GERD (gastroesophageal reflux disease)   . HCV (hepatitis C virus)     Treatment: Dr. Patsy [place]  . Skin cancer 1992    removed from side of head  . Pancreatitis, gallstone    Past Surgical History  Procedure Laterality Date  . Jaw broken    . Cholecystectomy    . Lipoma removals      SEVERAL  . Colonoscopy  10/04/2012    DEY:CXKGY sessile polyps measuring 3-8 mm/Moderate diverticulosis/Small internal hemorrhoids   Family History  Problem Relation Age of Onset  . Colon cancer Neg Hx    History  Substance Use Topics  . Smoking status: Current Every Day Smoker -- 0.50 packs/day for 28 years    Types: Cigarettes  . Smokeless tobacco: Never Used  . Alcohol Use: No    Review of Systems  All systems reviewed and negative, other than as noted in HPI.  Allergies  Review of patient's allergies indicates no known allergies.  Home Medications   Current Outpatient Rx  Name  Route  Sig  Dispense  Refill  . ibuprofen (ADVIL,MOTRIN) 200 MG tablet   Oral   Take 200 mg by mouth every  6 (six) hours as needed. For headache          BP 112/64  Pulse 67  Temp(Src) 98.4 F (36.9 C) (Oral)  Resp 18  Ht 5\' 5"  (1.651 m)  Wt 260 lb (117.935 kg)  BMI 43.27 kg/m2  SpO2 96% Physical Exam  Nursing note and vitals reviewed. Constitutional: He appears well-developed and well-nourished. No distress.  HENT:  Head: Normocephalic and atraumatic.  Eyes: Conjunctivae are normal. Right eye exhibits no discharge. Left eye exhibits no discharge.  Neck: Neck supple.  Cardiovascular: Normal rate, regular rhythm and normal heart sounds.  Exam reveals no gallop and no friction rub.   No murmur heard. Pulmonary/Chest: Effort normal and breath sounds normal. No respiratory distress.  Abdominal: Soft. He exhibits no distension. There is no tenderness.  Musculoskeletal: He exhibits no edema and no tenderness.  Lower extremities symmetric as compared to each other. No calf tenderness. Negative Homan's. No palpable cords.   Neurological: He is alert.  Skin: Skin is warm and dry.  Psychiatric: He has a normal mood and affect. His behavior is normal. Thought content normal.    ED Course  Procedures (including critical care time) Labs Review Labs Reviewed - No data to display Imaging Review No results found.  EKG Interpretation    Date/Time:  Saturday November 01 2013 16:02:24 EST Ventricular Rate:  61 PR Interval:  132 QRS Duration: 88 QT Interval:  396  QTC Calculation: 398 R Axis:   55 Text Interpretation:  Normal sinus rhythm Non-specific ST-t changes No previous ECGs available Confirmed by [place] Dube  MD, Camesha Farooq ([id number]) on 11/01/2013 4:27:41 PM            MDM   1. Chest pain    52yM with CP. Some atypical features. Prior records from [facility] obtained and reviewed. Previous unremarkable w/u in past couple admissions there. Same again today. Doubt PE, infectious or mass. Troponin 6 hours after onset of symptoms normal. No additional complaints. Outpt FU.     Virgel Manifold, MD 11/07/13 [phone]

## 2013-11-05 ENCOUNTER — Ambulatory Visit (INDEPENDENT_AMBULATORY_CARE_PROVIDER_SITE_OTHER): Payer: No Typology Code available for payment source | Admitting: Gastroenterology

## 2013-11-05 ENCOUNTER — Other Ambulatory Visit: Payer: Self-pay | Admitting: Gastroenterology

## 2013-11-05 ENCOUNTER — Other Ambulatory Visit: Payer: Self-pay

## 2013-11-05 VITALS — BP 104/73 | HR 79 | Temp 98.7°F | Ht 65.0 in | Wt 262.6 lb

## 2013-11-05 DIAGNOSIS — K7460 Unspecified cirrhosis of liver: Secondary | ICD-10-CM

## 2013-11-05 DIAGNOSIS — K219 Gastro-esophageal reflux disease without esophagitis: Secondary | ICD-10-CM

## 2013-11-05 DIAGNOSIS — R059 Cough, unspecified: Secondary | ICD-10-CM

## 2013-11-05 DIAGNOSIS — K760 Fatty (change of) liver, not elsewhere classified: Secondary | ICD-10-CM

## 2013-11-05 DIAGNOSIS — R05 Cough: Secondary | ICD-10-CM

## 2013-11-05 DIAGNOSIS — R911 Solitary pulmonary nodule: Secondary | ICD-10-CM

## 2013-11-05 DIAGNOSIS — Z1212 Encounter for screening for malignant neoplasm of rectum: Secondary | ICD-10-CM

## 2013-11-05 DIAGNOSIS — Z1211 Encounter for screening for malignant neoplasm of colon: Secondary | ICD-10-CM

## 2013-11-05 NOTE — Patient Instructions (Signed)
GET BLOOD DRAWN FRI.  HAVE ULTRASOUND OF LIVER WITHIN THE NEXT 7 DAYS  CONTINUE YOUR WEIGHT LOSS EFFORTS.  FOLLOW A LOW FAT DIET. SEE INFO BELOW.  FOLLOW UP IN 6 MOS.    Low-Fat Diet BREADS, CEREALS, PASTA, RICE, DRIED PEAS, AND BEANS These products are high in carbohydrates and most are low in fat. Therefore, they can be increased in the diet as substitutes for fatty foods. They too, however, contain calories and should not be eaten in excess. Cereals can be eaten for snacks as well as for breakfast.   FRUITS AND VEGETABLES It is good to eat fruits and vegetables. Besides being sources of fiber, both are rich in vitamins and some minerals. They help you get the daily allowances of these nutrients. Fruits and vegetables can be used for snacks and desserts.  MEATS Limit lean meat, chicken, [place], and fish to no more than 6 ounces per day. Beef, Pork, and Lamb Use lean cuts of beef, pork, and lamb. Lean cuts include:  Extra-lean ground beef.  Arm roast.  Sirloin tip.  Center-cut ham.  Round steak.  Loin chops.  Rump roast.  Tenderloin.  Trim all fat off the outside of meats before cooking. It is not necessary to severely decrease the intake of red meat, but lean choices should be made. Lean meat is rich in protein and contains a highly absorbable form of iron. Premenopausal women, in particular, should avoid reducing lean red meat because this could increase the risk for low red blood cells (iron-deficiency anemia).  Chicken and [place] These are good sources of protein. The fat of poultry can be reduced by removing the skin and underlying fat layers before cooking. Chicken and [place] can be substituted for lean red meat in the diet. Poultry should not be fried or covered with high-fat sauces. Fish and Shellfish Fish is a good source of protein. Shellfish contain cholesterol, but they usually are low in saturated fatty acids. The preparation of fish is important. Like chicken and  [place], they should not be fried or covered with high-fat sauces. EGGS Egg whites contain no fat or cholesterol. They can be eaten often. Try 1 to 2 egg whites instead of whole eggs in recipes or use egg substitutes that do not contain yolk. MILK AND DAIRY PRODUCTS Use skim or 1% milk instead of 2% or whole milk. Decrease whole milk, natural, and processed cheeses. Use nonfat or low-fat (2%) cottage cheese or low-fat cheeses made from vegetable oils. Choose nonfat or low-fat (1 to 2%) yogurt. Experiment with evaporated skim milk in recipes that call for heavy cream. Substitute low-fat yogurt or low-fat cottage cheese for sour cream in dips and salad dressings. Have at least 2 servings of low-fat dairy products, such as 2 glasses of skim (or 1%) milk each day to help get your daily calcium intake. FATS AND OILS Reduce the total intake of fats, especially saturated fat. Butterfat, lard, and beef fats are high in saturated fat and cholesterol. These should be avoided as much as possible. Vegetable fats do not contain cholesterol, but certain vegetable fats, such as coconut oil, palm oil, and palm kernel oil are very high in saturated fats. These should be limited. These fats are often used in bakery goods, processed foods, popcorn, oils, and nondairy creamers. Vegetable shortenings and some peanut butters contain hydrogenated oils, which are also saturated fats. Read the labels on these foods and check for saturated vegetable oils. Unsaturated vegetable oils and fats do not raise  blood cholesterol. However, they should be limited because they are fats and are high in calories. Total fat should still be limited to 30% of your daily caloric intake. Desirable liquid vegetable oils are corn oil, cottonseed oil, olive oil, canola oil, safflower oil, soybean oil, and sunflower oil. Peanut oil is not as good, but small amounts are acceptable. Buy a heart-healthy tub margarine that has no partially hydrogenated oils in  the ingredients. Mayonnaise and salad dressings often are made from unsaturated fats, but they should also be limited because of their high calorie and fat content. Seeds, nuts, peanut butter, olives, and avocados are high in fat, but the fat is mainly the unsaturated type. These foods should be limited mainly to avoid excess calories and fat. OTHER EATING TIPS Snacks  Most sweets should be limited as snacks. They tend to be rich in calories and fats, and their caloric content outweighs their nutritional value. Some good choices in snacks are graham crackers, melba toast, soda crackers, bagels (no egg), English muffins, fruits, and vegetables. These snacks are preferable to snack crackers, [place] fries, TORTILLA CHIPS, and POTATO chips. Popcorn should be air-popped or cooked in small amounts of liquid vegetable oil. Desserts Eat fruit, low-fat yogurt, and fruit ices instead of pastries, cake, and cookies. Sherbet, angel food cake, gelatin dessert, frozen low-fat yogurt, or other frozen products that do not contain saturated fat (pure fruit juice bars, frozen ice pops) are also acceptable.  COOKING METHODS Choose those methods that use little or no fat. They include: Poaching.  Braising.  Steaming.  Grilling.  Baking.  Stir-frying.  Broiling.  Microwaving.  Foods can be cooked in a nonstick pan without added fat, or use a nonfat cooking spray in regular cookware. Limit fried foods and avoid frying in saturated fat. Add moisture to lean meats by using water, broth, cooking wines, and other nonfat or low-fat sauces along with the cooking methods mentioned above. Soups and stews should be chilled after cooking. The fat that forms on top after a few hours in the refrigerator should be skimmed off. When preparing meals, avoid using excess salt. Salt can contribute to raising blood pressure in some people.  EATING AWAY FROM HOME Order entres, potatoes, and vegetables without sauces or butter. When  meat exceeds the size of a deck of cards (3 to 4 ounces), the rest can be taken home for another meal. Choose vegetable or fruit salads and ask for low-calorie salad dressings to be served on the side. Use dressings sparingly. Limit high-fat toppings, such as bacon, crumbled eggs, cheese, sunflower seeds, and olives. Ask for heart-healthy tub margarine instead of butter.

## 2013-11-05 NOTE — Progress Notes (Signed)
   Subjective:    Patient ID: Thomas Ball, male    DOB: [birth year], 53 y.o.   MRN: [id number] Default, Provider, MD  HPI No rectal bleeding. WANTS CHOLESTEROL CHECKED. CONCERNED ABOUT HAVING COPD AND PULMONARY NODULE. LOST 21 LBS SINCE DEC 2014. WIFE HAD BYPASS SURGERY. QUIT SMOKING FOR 8 MOS NOW STARTED BACK. WHEN HE SWALLOWS SALIVA GOES DOWN THE WRONG. NO PROBLEMS WITH SOLID FOOD. PT DENIES FEVER, CHILLS, nausea, vomiting, melena, diarrhea, constipation, abd pain, OR heartburn or indigestion. ONLY HAS HEARTBURN WHEN HE EATS THE WRONG THING.  Past Medical History  Diagnosis Date  . GERD (gastroesophageal reflux disease)   . HCV (hepatitis C virus)     Treatment: Dr. Patsy Baltimore  . Skin cancer 1992    removed from side of head  . Pancreatitis, gallstone    Past Surgical History  Procedure Laterality Date  . Jaw broken    . Cholecystectomy    . Lipoma removals      SEVERAL  . Colonoscopy  10/04/2012    UGQ:BVQXI sessile polyps measuring 3-8 mm/Moderate diverticulosis/Small internal hemorrhoids    No Known Allergies  Current Outpatient Prescriptions  Medication Sig Dispense Refill  . ibuprofen (ADVIL,MOTRIN) 200 MG tablet Take 200 mg by mouth every 6 (six) hours as needed. For headache       History   Social History  . Marital Status: Divorced    Spouse Name: N/A    Number of Children: N/A  . Years of Education: N/A   Occupational History  . concrete work    Social History Main Topics  . Smoking status: Current Every Day Smoker -- 0.50 packs/day for 28 years    Types: Cigarettes  . Smokeless tobacco: Never Used  . Alcohol Use: No  . Drug Use: Yes     Comment: in the 1980s, smoke crack. None since Dec 2011.   Marland Kitchen Sexual Activity: Not on file        Review of Systems     Objective:   Physical Exam  Vitals reviewed. Constitutional: He is oriented to person, place, and time. He appears well-nourished. No distress.  HENT:  Head: Normocephalic and atraumatic.    Mouth/Throat: No oropharyngeal exudate.  Eyes: Pupils are equal, round, and reactive to light. No scleral icterus.  Neck: Normal range of motion. Neck supple.  Cardiovascular: Normal rate, regular rhythm and normal heart sounds.   Pulmonary/Chest: Effort normal and breath sounds normal. No respiratory distress.  Abdominal: Soft. Bowel sounds are normal. He exhibits no distension. There is no tenderness.  OBESE   Musculoskeletal: He exhibits no edema.  Lymphadenopathy:    He has no cervical adenopathy.  Neurological: He is alert and oriented to person, place, and time.  NO FOCAL DEFICITS   Psychiatric: He has a normal mood and affect.          Assessment & Plan:

## 2013-11-05 NOTE — Assessment & Plan Note (Signed)
SX CONTROLLED WITH DIET.  LOW FAT DIET   CONTINUE WEIGHT LOSS OPV IN 6 MOS

## 2013-11-05 NOTE — Assessment & Plan Note (Signed)
NEXT TCS IN 2024

## 2013-11-05 NOTE — Assessment & Plan Note (Signed)
POSSIBLE ON U/S OCT 2013.  RUQ u/s/hepatic elastography WITHIN 7 DAYS HFP AND LIPID PROFILE FRI 13. OPV IN 6 MOS

## 2013-11-05 NOTE — Assessment & Plan Note (Addendum)
NEEDS REPEAT CT IN [facility] 2015.  REFER TO DR. HAWKINS.

## 2013-11-05 NOTE — Progress Notes (Signed)
No pcp

## 2013-11-07 ENCOUNTER — Telehealth: Payer: Self-pay | Admitting: Gastroenterology

## 2013-11-07 ENCOUNTER — Ambulatory Visit (HOSPITAL_COMMUNITY): Payer: No Typology Code available for payment source

## 2013-11-07 DIAGNOSIS — K7460 Unspecified cirrhosis of liver: Secondary | ICD-10-CM

## 2013-11-07 NOTE — Progress Notes (Signed)
Reminder in epic °

## 2013-11-07 NOTE — Telephone Encounter (Signed)
I've already ran the March recall list, but SF wants patient to repeat CT in March 2015.

## 2013-11-10 ENCOUNTER — Ambulatory Visit (HOSPITAL_COMMUNITY): Payer: Self-pay

## 2013-11-10 ENCOUNTER — Other Ambulatory Visit: Payer: Self-pay | Admitting: Gastroenterology

## 2013-11-10 ENCOUNTER — Encounter: Payer: Self-pay | Admitting: Gastroenterology

## 2013-11-10 NOTE — Telephone Encounter (Signed)
Letter has been mailed to the patient 

## 2013-11-11 ENCOUNTER — Ambulatory Visit: Payer: Self-pay | Admitting: Internal Medicine

## 2013-11-13 ENCOUNTER — Ambulatory Visit (HOSPITAL_COMMUNITY): Payer: No Typology Code available for payment source

## 2013-11-14 ENCOUNTER — Ambulatory Visit: Payer: Self-pay | Admitting: Internal Medicine

## 2013-11-18 ENCOUNTER — Ambulatory Visit: Payer: Self-pay | Admitting: Internal Medicine

## 2013-11-19 ENCOUNTER — Ambulatory Visit (HOSPITAL_COMMUNITY): Payer: No Typology Code available for payment source

## 2013-11-24 ENCOUNTER — Ambulatory Visit (HOSPITAL_COMMUNITY)
Admission: RE | Admit: 2013-11-24 | Discharge: 2013-11-24 | Disposition: A | Discharge status: Home / Self Care | Payer: No Typology Code available for payment source | Source: Ambulatory Visit | Attending: Gastroenterology | Admitting: Gastroenterology

## 2013-11-24 DIAGNOSIS — K7689 Other specified diseases of liver: Secondary | ICD-10-CM | POA: Insufficient documentation

## 2013-11-24 DIAGNOSIS — K760 Fatty (change of) liver, not elsewhere classified: Secondary | ICD-10-CM

## 2013-11-24 DIAGNOSIS — K7460 Unspecified cirrhosis of liver: Secondary | ICD-10-CM | POA: Insufficient documentation

## 2013-11-24 LAB — HEPATIC FUNCTION PANEL
ALBUMIN: 4.2 g/dL (ref 3.5–5.2)
ALT: 36 U/L (ref 0–53)
AST: 24 U/L (ref 0–37)
Alkaline Phosphatase: 24 U/L — ABNORMAL LOW (ref 39–117)
BILIRUBIN INDIRECT: 1.2 mg/dL (ref 0.2–1.2)
Bilirubin, Direct: 0.3 mg/dL (ref 0.0–0.3)
TOTAL PROTEIN: 7 g/dL (ref 6.0–8.3)
Total Bilirubin: 1.5 mg/dL — ABNORMAL HIGH (ref 0.2–1.2)

## 2013-11-24 LAB — LIPID PANEL
CHOL/HDL RATIO: 5.6 ratio
CHOLESTEROL: 151 mg/dL (ref 0–200)
HDL: 27 mg/dL — AB (ref >39)
LDL Cholesterol: 99 mg/dL (ref 0–99)
TRIGLYCERIDES: 126 mg/dL (ref <150)
VLDL: 25 mg/dL (ref 0–40)

## 2013-11-25 ENCOUNTER — Ambulatory Visit: Payer: Self-pay | Admitting: Internal Medicine

## 2013-11-27 ENCOUNTER — Ambulatory Visit: Payer: Self-pay | Admitting: Internal Medicine

## 2013-11-27 NOTE — Telephone Encounter (Signed)
PLEASE CALL PT. HIS U/S SHOWS CIRRHOSIS, BUT HIS LIVER TESTS ARE NORMAL. CONTINUE YOUR WEIGHT LOSS EFFORTS. LOW FAT DIET. COMPLETE REPEAT CHEST CT. OPV AUG 2015 E30 CIRRHOSIS.

## 2013-11-27 NOTE — Assessment & Plan Note (Signed)
OPV AUG 2015. WILL NEED AFP/U/S, PT/INR, CMP, CBC.

## 2013-12-02 ENCOUNTER — Telehealth: Payer: Self-pay

## 2013-12-02 NOTE — Telephone Encounter (Signed)
Pt called for results of his [place] and labs. Routing to Dr. Oneida Alar.

## 2013-12-03 ENCOUNTER — Ambulatory Visit: Payer: Self-pay | Admitting: Internal Medicine

## 2013-12-03 ENCOUNTER — Other Ambulatory Visit: Payer: Self-pay | Admitting: Gastroenterology

## 2013-12-03 ENCOUNTER — Encounter: Payer: Self-pay | Admitting: Gastroenterology

## 2013-12-03 DIAGNOSIS — R911 Solitary pulmonary nodule: Secondary | ICD-10-CM

## 2013-12-03 NOTE — Telephone Encounter (Signed)
Pt called and was informed. He was transferred to [facility] to schedule his CT.

## 2013-12-03 NOTE — Telephone Encounter (Signed)
LMOM to call.

## 2013-12-03 NOTE — Telephone Encounter (Signed)
REVIEWED u/s MAR 5. See prior telephone note.

## 2013-12-04 ENCOUNTER — Ambulatory Visit (HOSPITAL_COMMUNITY): Payer: Self-pay

## 2013-12-05 ENCOUNTER — Ambulatory Visit (INDEPENDENT_AMBULATORY_CARE_PROVIDER_SITE_OTHER): Payer: No Typology Code available for payment source | Admitting: Internal Medicine

## 2013-12-05 ENCOUNTER — Encounter: Payer: Self-pay | Admitting: Internal Medicine

## 2013-12-05 ENCOUNTER — Encounter (INDEPENDENT_AMBULATORY_CARE_PROVIDER_SITE_OTHER): Payer: Self-pay

## 2013-12-05 VITALS — BP 114/76 | HR 63 | Temp 98.0°F | Ht 64.0 in | Wt 263.0 lb

## 2013-12-05 DIAGNOSIS — F17200 Nicotine dependence, unspecified, uncomplicated: Secondary | ICD-10-CM

## 2013-12-05 DIAGNOSIS — R911 Solitary pulmonary nodule: Secondary | ICD-10-CM

## 2013-12-05 DIAGNOSIS — J4489 Other specified chronic obstructive pulmonary disease: Secondary | ICD-10-CM

## 2013-12-05 DIAGNOSIS — J449 Chronic obstructive pulmonary disease, unspecified: Secondary | ICD-10-CM

## 2013-12-05 NOTE — Patient Instructions (Signed)
The key is to stop smoking completely before smoking completely stops you!   The only scan you need is a limited CT chest through the nodule area, not the entire lung   Please schedule a follow up office visit in 6 weeks, call sooner if needed with pfts on return

## 2013-12-05 NOTE — Progress Notes (Signed)
Subjective:    Patient ID: Thomas Ball, male    DOB: 05-29-1961   MRN: [id number]    Brief patient profile:  62 yowm smoker referred by Dr Johny Chess PA for spn.   History of Present Illness  02/14/2012 1st pulmonary ov cc onset sob in late 90's with wt around 200 progressed to point where best days has trouble finishing mowing yard but  on worse days can't get 50 ft down where the dogs are kept  And back uphill to house with most recent flare attributed to Hepatitis meds which were stopped at admission > back to baseline and assoc with cough much better rec Stop smoking  R/u SPN as per guidelines   09/09/14 CT 1. Stable right lower lobe pulmonary nodule. In a patient that is at  increased risk for lung cancer documented stability of this nodule  for 24 months is advised. Therefore, the next followup examination  should be obtained around 12/05/2013   12/05/2013 f/u ov/Freddrick Gladson re: doe/ spn/ still smoking  Chief Complaint  Patient presents with  . Follow-up    Pt c/o increased SOB, wheezing with exertion, prod cough with unknown color.   doe x walking uphill from where keeps dog, walking dogs for 15 min twice daily No noct complaints  Cough mostly in ams, then clears.  Not on any inhalers  No obvious day to day or daytime variabilty or assoc  cp or chest tightness, subjective wheeze overt sinus or hb symptoms. No unusual exp hx or h/o childhood pna/ asthma or knowledge of premature birth.  Sleeping ok without nocturnal  or early am exacerbation  of respiratory  c/o's or need for noct saba. Also denies any obvious fluctuation of symptoms with weather or environmental changes or other aggravating or alleviating factors except as outlined above   Current Medications, Allergies, Complete Past Medical History, Past Surgical History, Family History, and Social History were reviewed in [employer] record.  ROS  The following are not active complaints unless  bolded sore throat, dysphagia, dental problems, itching, sneezing,  nasal congestion or excess/ purulent secretions, ear ache,   fever, chills, sweats, unintended wt loss, pleuritic or exertional cp, hemoptysis,  orthopnea pnd or leg swelling, presyncope, palpitations, heartburn, abdominal pain, anorexia, nausea, vomiting, diarrhea  or change in bowel or urinary habits, change in stools or urine, dysuria,hematuria,  rash, arthralgias, visual complaints, headache, numbness weakness or ataxia or problems with walking or coordination,  change in mood/affect or memory.               Objective:   Physical Exam  amb obese wm nad  Wt 238 02/14/2012 > 12/05/2013  263   HEENT: nl dentition, turbinates, and orophanx. Nl external ear canals without cough reflex   NECK :  without JVD/Nodes/TM/ nl carotid upstrokes bilaterally   LUNGS: no acc muscle use, clear to A and P bilaterally without cough on insp or exp maneuvers   CV:  RRR  no s3 or murmur or increase in P2, no edema   ABD:  soft and nontender with nl excursion in the supine position. No bruits or organomegaly, bowel sounds nl  MS:  warm without deformities, calf tenderness, cyanosis or clubbing  SKIN: warm and dry without lesions    NEURO:  alert, approp, no deficits    cxr 11/01/13  Mild cardiomegaly. Calcified granuloma in the left upper lobe. Lungs  otherwise clear. No effusions or acute bony abnormality.  Assessment & Plan:

## 2013-12-06 NOTE — Assessment & Plan Note (Signed)
I took an extended  opportunity with this patient to outline the consequences of continued cigarette use  in airway disorders based on all the data we have from the multiple national lung health studies (perfomed over decades at millions of dollars in cost)  indicating that smoking cessation, not choice of inhalers or physicians, is the most important aspect of care.   

## 2013-12-06 NOTE — Assessment & Plan Note (Signed)
-  CT chest 12/06/11  6 mm nodule RLL > 07/25/12 CT abd > no change in RLL nodule>  - CT 09/09/13 1. Stable right lower lobe pulmonary nodule. In a patient that is at increased risk for lung cancer documented stability of this nodule for 24 months is advised. > f/u ct 11/2013 will complete the w/u

## 2013-12-06 NOTE — Assessment & Plan Note (Signed)
Clinically mild, needs to quit smoking and return for pfts

## 2013-12-08 ENCOUNTER — Ambulatory Visit (HOSPITAL_COMMUNITY): Payer: Self-pay

## 2013-12-08 NOTE — Telephone Encounter (Signed)
Reminder in epic °

## 2013-12-10 ENCOUNTER — Telehealth: Payer: Self-pay | Admitting: Gastroenterology

## 2013-12-10 ENCOUNTER — Ambulatory Visit (HOSPITAL_COMMUNITY)
Admission: RE | Admit: 2013-12-10 | Discharge: 2013-12-10 | Disposition: A | Discharge status: Home / Self Care | Payer: No Typology Code available for payment source | Source: Ambulatory Visit | Attending: Gastroenterology | Admitting: Gastroenterology

## 2013-12-10 DIAGNOSIS — Z09 Encounter for follow-up examination after completed treatment for conditions other than malignant neoplasm: Secondary | ICD-10-CM | POA: Insufficient documentation

## 2013-12-10 DIAGNOSIS — R911 Solitary pulmonary nodule: Secondary | ICD-10-CM | POA: Insufficient documentation

## 2013-12-10 NOTE — Telephone Encounter (Signed)
OPENED IN ERROR

## 2013-12-15 NOTE — Telephone Encounter (Signed)
PLEASE CALL PT. HIS REPEAT CHEST CT SHOWS STABLE LUNG NODULE IN HIS RIGHT LUNG AND A SEBACEOUS CYST IN HIS LEFT MID-BACK. OPV AUG 2015 E30 CIRRHOSIS.

## 2013-12-16 NOTE — Telephone Encounter (Signed)
LMOM to call.

## 2013-12-16 NOTE — Telephone Encounter (Signed)
Pt returned call and was informed.  

## 2014-01-14 ENCOUNTER — Ambulatory Visit: Payer: No Typology Code available for payment source | Admitting: Gastroenterology

## 2014-01-19 ENCOUNTER — Other Ambulatory Visit: Payer: Self-pay | Admitting: Internal Medicine

## 2014-01-19 DIAGNOSIS — J449 Chronic obstructive pulmonary disease, unspecified: Secondary | ICD-10-CM

## 2014-01-20 ENCOUNTER — Emergency Department (HOSPITAL_COMMUNITY)
Admission: EM | Admit: 2014-01-20 | Discharge: 2014-01-20 | Disposition: A | Discharge status: Home / Self Care | Payer: No Typology Code available for payment source | Attending: Emergency Medicine | Admitting: Emergency Medicine

## 2014-01-20 ENCOUNTER — Ambulatory Visit: Payer: Self-pay | Admitting: Internal Medicine

## 2014-01-20 ENCOUNTER — Encounter (HOSPITAL_COMMUNITY): Payer: Self-pay | Admitting: Emergency Medicine

## 2014-01-20 DIAGNOSIS — Z85828 Personal history of other malignant neoplasm of skin: Secondary | ICD-10-CM | POA: Insufficient documentation

## 2014-01-20 DIAGNOSIS — R05 Cough: Secondary | ICD-10-CM

## 2014-01-20 DIAGNOSIS — R059 Cough, unspecified: Secondary | ICD-10-CM | POA: Insufficient documentation

## 2014-01-20 DIAGNOSIS — R0602 Shortness of breath: Secondary | ICD-10-CM | POA: Insufficient documentation

## 2014-01-20 DIAGNOSIS — F17200 Nicotine dependence, unspecified, uncomplicated: Secondary | ICD-10-CM | POA: Insufficient documentation

## 2014-01-20 DIAGNOSIS — Z8719 Personal history of other diseases of the digestive system: Secondary | ICD-10-CM | POA: Insufficient documentation

## 2014-01-20 DIAGNOSIS — Z8619 Personal history of other infectious and parasitic diseases: Secondary | ICD-10-CM | POA: Insufficient documentation

## 2014-01-20 MED ORDER — ALBUTEROL SULFATE HFA 108 (90 BASE) MCG/ACT IN AERS
2.0000 | INHALATION_SPRAY | Freq: Once | RESPIRATORY_TRACT | Status: AC
  Administered 2014-01-20: 2 via RESPIRATORY_TRACT
  Filled 2014-01-20: qty 6.7

## 2014-01-20 MED ORDER — DOXYCYCLINE HYCLATE 100 MG PO CAPS: 100.0000 mg | ORAL_CAPSULE | Freq: Two times a day (BID) | ORAL | Status: DC

## 2014-01-20 NOTE — ED Notes (Signed)
Pt states he has had a cough for the past 3 weeks. Gets something up sometimes but not sure of what color. Pt denies any fevers.

## 2014-01-20 NOTE — ED Notes (Signed)
Pt alert & oriented x4, stable gait. Patient given discharge instructions, paperwork & prescription(s). Patient  instructed to stop at the registration desk to finish any additional paperwork. Patient verbalized understanding. Pt left department w/ no further questions. 

## 2014-01-20 NOTE — ED Provider Notes (Signed)
CSN: [id number]     Arrival date & time 01/20/14  0458 History   First MD Initiated Contact with Patient 01/20/14 0502     Chief Complaint  Patient presents with  . Cough      Patient is a 53 y.o. male presenting with cough. The history is provided by the patient.  Cough Cough characteristics:  Productive Severity:  Moderate Onset quality:  Gradual Duration:  3 weeks Timing:  Intermittent Progression:  Unchanged Chronicity:  Recurrent Smoker: yes   Relieved by:  None tried Worsened by:  Nothing tried Associated symptoms: shortness of breath   Associated symptoms: no fever   Associated symptoms comment:  Chest pain with cough    Past Medical History  Diagnosis Date  . GERD (gastroesophageal reflux disease)   . HCV (hepatitis C virus) DR. ZACKS    AUG 2013 VIRAL LOAD UNDETECTABLE  . Skin cancer 1992    removed from side of head  . Pancreatitis, gallstone    Past Surgical History  Procedure Laterality Date  . Jaw broken    . Cholecystectomy    . Lipoma removals      SEVERAL  . Colonoscopy  10/04/2012    YQM:VHQIO sessile polyps measuring 3-8 mm/Moderate diverticulosis/Small internal hemorrhoids   Family History  Problem Relation Age of Onset  . Colon cancer Neg Hx    History  Substance Use Topics  . Smoking status: Current Every Day Smoker -- 0.50 packs/day for 28 years    Types: Cigarettes  . Smokeless tobacco: Never Used     Comment: had quit for 9 mos, picked up again 2 mos ago  . Alcohol Use: No    Review of Systems  Constitutional: Negative for fever.  Respiratory: Positive for cough and shortness of breath.        Denies dyspnea on exertion   Cardiovascular: Negative for leg swelling.      Allergies  Review of patient's allergies indicates no known allergies.  Home Medications   Prior to Admission medications   Medication Sig Start Date End Date Taking? Authorizing Provider  ibuprofen (ADVIL,MOTRIN) 200 MG tablet Take 200 mg by mouth every  6 (six) hours as needed. For headache   Yes Historical Provider, MD  doxycycline (VIBRAMYCIN) 100 MG capsule Take 1 capsule (100 mg total) by mouth 2 (two) times daily. 01/20/14   Sharyon Cable, MD   BP 125/88  Pulse 66  Temp(Src) 97.9 F (36.6 C) (Oral)  Resp 18  Ht 5\' 5"  (1.651 m)  Wt 255 lb (115.667 kg)  BMI 42.43 kg/m2  SpO2 96% Physical Exam CONSTITUTIONAL: Well developed/well nourished HEAD: Normocephalic/atraumatic EYES: EOMI/PERRL ENMT: Mucous membranes moist NECK: supple no meningeal signs SPINE:entire spine nontender CV: S1/S2 noted, no murmurs/rubs/gallops noted LUNGS: scattered wheeze, otherwise clear, no apparent distress ABDOMEN: soft, nontender, no rebound or guarding GU:no cva tenderness NEURO: Pt is awake/alert, moves all extremitiesx4 EXTREMITIES: pulses normal, full ROM, no LE edema noted SKIN: warm, color normal PSYCH: no abnormalities of mood noted  ED Course  Procedures  Pt well appearing, reading a book, no distress He is ambulatory without difficulty and denies any recent increase in dyspnea on exertion He reports he is undergoing evaluation for COPD by pulmonology He had CT chest last month for pulmonary nodule I don't feel he needs repeat imaging today Will give him albuterol and also started on doxycycline due to persistent cough, smoking history and probable COPD  MDM   Final diagnoses:  Cough  Nursing notes including past medical history and social history reviewed and considered in documentation Previous records reviewed and considered     Sharyon Cable, MD 01/20/14 [phone]

## 2014-02-09 ENCOUNTER — Ambulatory Visit: Payer: Self-pay | Admitting: Internal Medicine

## 2014-02-09 ENCOUNTER — Encounter (HOSPITAL_COMMUNITY): Payer: No Typology Code available for payment source

## 2014-02-18 ENCOUNTER — Telehealth: Payer: Self-pay | Admitting: Gastroenterology

## 2014-02-18 ENCOUNTER — Encounter (INDEPENDENT_AMBULATORY_CARE_PROVIDER_SITE_OTHER): Payer: No Typology Code available for payment source | Admitting: Gastroenterology

## 2014-02-18 NOTE — Telephone Encounter (Signed)
Pt was a no show

## 2014-02-18 NOTE — Progress Notes (Signed)
   Subjective:    Patient ID: Thomas Ball, male    DOB: [birth year], 53 y.o.   MRN: [id number]  HPI Past Medical History  Diagnosis Date  . GERD (gastroesophageal reflux disease)   . HCV (hepatitis C virus) DR. ZACKS    AUG 2013 VIRAL LOAD UNDETECTABLE  . Skin cancer 1992    removed from side of head  . Pancreatitis, gallstone       Review of Systems     Objective:   Physical Exam        Assessment & Plan:

## 2014-02-18 NOTE — Telephone Encounter (Signed)
REVIEWED.  

## 2014-04-07 ENCOUNTER — Emergency Department (HOSPITAL_COMMUNITY)
Admission: EM | Admit: 2014-04-07 | Discharge: 2014-04-07 | Disposition: A | Discharge status: Home / Self Care | Payer: Self-pay | Attending: Emergency Medicine | Admitting: Emergency Medicine

## 2014-04-07 ENCOUNTER — Encounter (HOSPITAL_COMMUNITY): Payer: Self-pay | Admitting: Emergency Medicine

## 2014-04-07 DIAGNOSIS — Z79899 Other long term (current) drug therapy: Secondary | ICD-10-CM | POA: Insufficient documentation

## 2014-04-07 DIAGNOSIS — Z8619 Personal history of other infectious and parasitic diseases: Secondary | ICD-10-CM | POA: Insufficient documentation

## 2014-04-07 DIAGNOSIS — Z8719 Personal history of other diseases of the digestive system: Secondary | ICD-10-CM | POA: Insufficient documentation

## 2014-04-07 DIAGNOSIS — R51 Headache: Secondary | ICD-10-CM | POA: Insufficient documentation

## 2014-04-07 DIAGNOSIS — Z85828 Personal history of other malignant neoplasm of skin: Secondary | ICD-10-CM | POA: Insufficient documentation

## 2014-04-07 DIAGNOSIS — F17200 Nicotine dependence, unspecified, uncomplicated: Secondary | ICD-10-CM | POA: Insufficient documentation

## 2014-04-07 DIAGNOSIS — Z789 Other specified health status: Secondary | ICD-10-CM

## 2014-04-07 DIAGNOSIS — R002 Palpitations: Secondary | ICD-10-CM | POA: Insufficient documentation

## 2014-04-07 DIAGNOSIS — R519 Headache, unspecified: Secondary | ICD-10-CM

## 2014-04-07 DIAGNOSIS — F1910 Other psychoactive substance abuse, uncomplicated: Secondary | ICD-10-CM | POA: Insufficient documentation

## 2014-04-07 LAB — CBC WITH DIFFERENTIAL/PLATELET
BASOS PCT: 1 % (ref 0–1)
Basophils Absolute: 0 10*3/uL (ref 0.0–0.1)
EOS ABS: 0.2 10*3/uL (ref 0.0–0.7)
Eosinophils Relative: 2 % (ref 0–5)
HCT: 46.9 % (ref 39.0–52.0)
Hemoglobin: 16.7 g/dL (ref 13.0–17.0)
LYMPHS ABS: 1.7 10*3/uL (ref 0.7–4.0)
Lymphocytes Relative: 25 % (ref 12–46)
MCH: 33.7 pg (ref 26.0–34.0)
MCHC: 35.6 g/dL (ref 30.0–36.0)
MCV: 94.6 fL (ref 78.0–100.0)
Monocytes Absolute: 0.5 10*3/uL (ref 0.1–1.0)
Monocytes Relative: 8 % (ref 3–12)
NEUTROS PCT: 64 % (ref 43–77)
Neutro Abs: 4.3 10*3/uL (ref 1.7–7.7)
PLATELETS: 161 10*3/uL (ref 150–400)
RBC: 4.96 MIL/uL (ref 4.22–5.81)
RDW: 13.4 % (ref 11.5–15.5)
WBC: 6.8 10*3/uL (ref 4.0–10.5)

## 2014-04-07 LAB — BASIC METABOLIC PANEL
Anion gap: 11 (ref 5–15)
BUN: 13 mg/dL (ref 6–23)
CO2: 27 mEq/L (ref 19–32)
Calcium: 8.7 mg/dL (ref 8.4–10.5)
Chloride: 102 mEq/L (ref 96–112)
Creatinine, Ser: 0.87 mg/dL (ref 0.50–1.35)
GLUCOSE: 75 mg/dL (ref 70–99)
POTASSIUM: 4.2 meq/L (ref 3.7–5.3)
SODIUM: 140 meq/L (ref 137–147)

## 2014-04-07 LAB — CBG MONITORING, ED: Glucose-Capillary: 74 mg/dL (ref 70–99)

## 2014-04-07 LAB — TROPONIN I: Troponin I: <0.3 ng/mL (ref <0.30)

## 2014-04-07 MED ORDER — PROMETHAZINE HCL 25 MG PO TABS: 25.0000 mg | ORAL_TABLET | Freq: Four times a day (QID) | ORAL | Status: DC | PRN

## 2014-04-07 MED ORDER — IBUPROFEN 800 MG PO TABS: 800.0000 mg | ORAL_TABLET | Freq: Three times a day (TID) | ORAL | Status: DC

## 2014-04-07 MED ORDER — SODIUM CHLORIDE 0.9 % IV SOLN
Freq: Once | INTRAVENOUS | Status: AC
  Administered 2014-04-07: 500 mL via INTRAVENOUS

## 2014-04-07 NOTE — ED Notes (Signed)
MD at bedside. 

## 2014-04-07 NOTE — ED Notes (Signed)
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 Today pt still having sob, headache, has not had anymore of the heart racing since yesterday.

## 2014-04-07 NOTE — Discharge Instructions (Signed)
Headaches, Frequently Asked Questions MIGRAINE HEADACHES Q: What is migraine? What causes it? How can I treat it? A: Generally, migraine headaches begin as a dull ache. Then they develop into a constant, throbbing, and pulsating pain. You may experience pain at the temples. You may experience pain at the front or back of one or both sides of the head. The pain is usually accompanied by a combination of:  Nausea.  Vomiting.  Sensitivity to light and noise. Some people (about 15%) experience an aura (see below) before an attack. The cause of migraine is believed to be chemical reactions in the brain. Treatment for migraine may include over-the-counter or prescription medications. It may also include self-help techniques. These include relaxation training and biofeedback.  Q: What is an aura? A: About 15% of people with migraine get an "aura". This is a sign of neurological symptoms that occur before a migraine headache. You may see wavy or jagged lines, dots, or flashing lights. You might experience tunnel vision or blind spots in one or both eyes. The aura can include visual or auditory hallucinations (something imagined). It may include disruptions in smell (such as strange odors), taste or touch. Other symptoms include:  Numbness.  A "pins and needles" sensation.  Difficulty in recalling or speaking the correct word. These neurological events may last as long as 60 minutes. These symptoms will fade as the headache begins. Q: What is a trigger? A: Certain physical or environmental factors can lead to or "trigger" a migraine. These include:  Foods.  Hormonal changes.  Weather.  Stress. It is important to remember that triggers are different for everyone. To help prevent migraine attacks, you need to figure out which triggers affect you. Keep a headache diary. This is a good way to track triggers. The diary will help you talk to your healthcare professional about your condition. Q: Does  weather affect migraines? A: Bright sunshine, hot, humid conditions, and drastic changes in barometric pressure may lead to, or "trigger," a migraine attack in some people. But studies have shown that weather does not act as a trigger for everyone with migraines. Q: What is the link between migraine and hormones? A: Hormones start and regulate many of your body's functions. Hormones keep your body in balance within a constantly changing environment. The levels of hormones in your body are unbalanced at times. Examples are during menstruation, pregnancy, or menopause. That can lead to a migraine attack. In fact, about three quarters of all women with migraine report that their attacks are related to the menstrual cycle.  Q: Is there an increased risk of stroke for migraine sufferers? A: The likelihood of a migraine attack causing a stroke is very remote. That is not to say that migraine sufferers cannot have a stroke associated with their migraines. In persons under age 53, the most common associated factor for stroke is migraine headache. But over the course of a person's normal life span, the occurrence of migraine headache may actually be associated with a reduced risk of dying from cerebrovascular disease due to stroke.  Q: What are acute medications for migraine? A: Acute medications are used to treat the pain of the headache after it has started. Examples over-the-counter medications, NSAIDs, ergots, and triptans.  Q: What are the triptans? A: Triptans are the newest class of abortive medications. They are specifically targeted to treat migraine. Triptans are vasoconstrictors. They moderate some chemical reactions in the brain. The triptans work on receptors in your brain. Triptans help  to restore the balance of a neurotransmitter called serotonin. Fluctuations in levels of serotonin are thought to be a main cause of migraine.  °Q: Are over-the-counter medications for migraine effective? °A:  Over-the-counter, or "OTC," medications may be effective in relieving mild to moderate pain and associated symptoms of migraine. But you should see your caregiver before beginning any treatment regimen for migraine.  °Q: What are preventive medications for migraine? °A: Preventive medications for migraine are sometimes referred to as "prophylactic" treatments. They are used to reduce the frequency, severity, and length of migraine attacks. Examples of preventive medications include antiepileptic medications, antidepressants, beta-blockers, calcium channel blockers, and NSAIDs (nonsteroidal anti-inflammatory drugs). °Q: Why are anticonvulsants used to treat migraine? °A: During the past few years, there has been an increased interest in antiepileptic drugs for the prevention of migraine. They are sometimes referred to as "anticonvulsants". Both epilepsy and migraine may be caused by similar reactions in the brain.  °Q: Why are antidepressants used to treat migraine? °A: Antidepressants are typically used to treat people with depression. They may reduce migraine frequency by regulating chemical levels, such as serotonin, in the brain.  °Q: What alternative therapies are used to treat migraine? °A: The term "alternative therapies" is often used to describe treatments considered outside the scope of conventional Western medicine. Examples of alternative therapy include acupuncture, acupressure, and yoga. Another common alternative treatment is herbal therapy. Some herbs are believed to relieve headache pain. Always discuss alternative therapies with your caregiver before proceeding. Some herbal products contain arsenic and other toxins. °TENSION HEADACHES °Q: What is a tension-type headache? What causes it? How can I treat it? °A: Tension-type headaches occur randomly. They are often the result of temporary stress, anxiety, fatigue, or anger. Symptoms include soreness in your temples, a tightening band-like sensation  around your head (a "vice-like" ache). Symptoms can also include a pulling feeling, pressure sensations, and contracting head and neck muscles. The headache begins in your forehead, temples, or the back of your head and neck. Treatment for tension-type headache may include over-the-counter or prescription medications. Treatment may also include self-help techniques such as relaxation training and biofeedback. °CLUSTER HEADACHES °Q: What is a cluster headache? What causes it? How can I treat it? °A: Cluster headache gets its name because the attacks come in groups. The pain arrives with little, if any, warning. It is usually on one side of the head. A tearing or bloodshot eye and a runny nose on the same side of the headache may also accompany the pain. Cluster headaches are believed to be caused by chemical reactions in the brain. They have been described as the most severe and intense of any headache type. Treatment for cluster headache includes prescription medication and oxygen. °SINUS HEADACHES °Q: What is a sinus headache? What causes it? How can I treat it? °A: When a cavity in the bones of the face and skull (a sinus) becomes inflamed, the inflammation will cause localized pain. This condition is usually the result of an allergic reaction, a tumor, or an infection. If your headache is caused by a sinus blockage, such as an infection, you will probably have a fever. An x-ray will confirm a sinus blockage. Your caregiver's treatment might include antibiotics for the infection, as well as antihistamines or decongestants.  °REBOUND HEADACHES °Q: What is a rebound headache? What causes it? How can I treat it? °A: A pattern of taking acute headache medications too often can lead to a condition known as "rebound headache."   A pattern of taking too much headache medication includes taking it more than 2 days per week or in excessive amounts. That means more than the label or a caregiver advises. With rebound  headaches, your medications not only stop relieving pain, they actually begin to cause headaches. Doctors treat rebound headache by tapering the medication that is being overused. Sometimes your caregiver will gradually substitute a different type of treatment or medication. Stopping may be a challenge. Regularly overusing a medication increases the potential for serious side effects. Consult a caregiver if you regularly use headache medications more than 2 days per week or more than the label advises. ADDITIONAL QUESTIONS AND ANSWERS Q: What is biofeedback? A: Biofeedback is a self-help treatment. Biofeedback uses special equipment to monitor your body's involuntary physical responses. Biofeedback monitors:  Breathing.  Pulse.  Heart rate.  Temperature.  Muscle tension.  Brain activity. Biofeedback helps you refine and perfect your relaxation exercises. You learn to control the physical responses that are related to stress. Once the technique has been mastered, you do not need the equipment any more. Q: Are headaches hereditary? A: Four out of five (80%) of people that suffer report a family history of migraine. Scientists are not sure if this is genetic or a family predisposition. Despite the uncertainty, a child has a 50% chance of having migraine if one parent suffers. The child has a 75% chance if both parents suffer.  Q: Can children get headaches? A: By the time they reach high school, most young people have experienced some type of headache. Many safe and effective approaches or medications can prevent a headache from occurring or stop it after it has begun.  Q: What type of doctor should I see to diagnose and treat my headache? A: Start with your primary caregiver. Discuss his or her experience and approach to headaches. Discuss methods of classification, diagnosis, and treatment. Your caregiver may decide to recommend you to a headache specialist, depending upon your symptoms or other  physical conditions. Having diabetes, allergies, etc., may require a more comprehensive and inclusive approach to your headache. The National Headache Foundation will provide, upon request, a list of [facility] physician members in your state. Document Released: 12/02/2003 Document Revised: 12/04/2011 Document Reviewed: 05/11/2008 [facility] Patient Information 2015 [place], [place]. This information is not intended to replace advice given to you by your health care provider. Make sure you discuss any questions you have with your health care provider.  Palpitations  A palpitation is the feeling that your heartbeat is irregular. It may feel like your heart is fluttering or skipping a beat. It may also feel like your heart is beating faster than normal. This is usually not a serious problem. In some cases, you may need more medical tests. HOME CARE  Avoid:  Caffeine in coffee, tea, soft drinks, diet pills, and energy drinks.  Chocolate.  Alcohol.  Stop smoking if you smoke.  Reduce your stress and anxiety. Try:  A method that measures bodily functions so you can learn to control them (biofeedback).  Yoga.  Meditation.  Physical activity such as swimming, jogging, or walking.  Get plenty of rest and sleep. GET HELP RIGHT AWAY IF:   You have chest pain.  You feel short of breath.  You have a very bad headache.  You feel dizzy or pass out (faint).  Your fast or irregular heartbeat continues after 24 hours.  Your palpitations occur more often. MAKE SURE YOU:   Understand these instructions.  Will  watch your condition.  Will get help right away if you are not doing well or get worse. Document Released: 06/20/2008 Document Revised: 03/12/2012 Document Reviewed: 11/10/2011 [facility] Patient Information 2015 [place], [place]. This information is not intended to replace advice given to you by your health care provider. Make sure you discuss any questions you have with your health care  provider.

## 2014-04-07 NOTE — ED Notes (Signed)
Drinks 8-10 sodas daily and decided to stop all caffeine.  C/o headache and fast heart rate.  Took couple Advil for headache with moderate relief.  Pt says he's feeling tired.

## 2014-04-07 NOTE — ED Provider Notes (Signed)
CSN: [id number]     Arrival date & time 04/07/14  1415 History   First MD Initiated Contact with Patient 04/07/14 1547     Chief Complaint  Patient presents with  . Shortness of Breath  . Headache     (Consider location/radiation/quality/duration/timing/severity/associated sxs/prior Treatment) HPI Comments: Patient presents to the ER for evaluation of shortness of breath, fast heart rate, headache. The patient reports that he worked outside in the heat all day 4 days ago. This was unusual for him. Since then he has been having some intermittent episodes of vomiting his heart is racing. There is no chest pain. He is not currently feeling short of breath, but just to feeling intermittently like his heart is racing.  Also reports that he has a headache. He reports that he drinks approximately 10 to soft drinks a day. 2 days ago he stopped [place]ki. Since then he has been having constant, frontal headache. No fever, neck stiffness.  Patient is a 53 y.o. male presenting with shortness of breath and headaches.  Shortness of Breath Associated symptoms: headaches   Associated symptoms: no chest pain   Headache   Past Medical History  Diagnosis Date  . GERD (gastroesophageal reflux disease)   . HCV (hepatitis C virus) DR. ZACKS    AUG 2013 VIRAL LOAD UNDETECTABLE  . Skin cancer 1992    removed from side of head  . Pancreatitis, gallstone    Past Surgical History  Procedure Laterality Date  . Jaw broken    . Cholecystectomy    . Lipoma removals      SEVERAL  . Colonoscopy  10/04/2012    QIW:LNLGX sessile polyps measuring 3-8 mm/Moderate diverticulosis/Small internal hemorrhoids   Family History  Problem Relation Age of Onset  . Colon cancer Neg Hx    History  Substance Use Topics  . Smoking status: Current Every Day Smoker -- 0.50 packs/day for 28 years    Types: Cigarettes  . Smokeless tobacco: Never Used     Comment: had quit for 9 mos, picked up again 2 mos ago  . Alcohol Use:  No    Review of Systems  Respiratory: Positive for shortness of breath.   Cardiovascular: Negative for chest pain.  Neurological: Positive for headaches.  All other systems reviewed and are negative.     Allergies  Review of patient's allergies indicates no known allergies.  Home Medications   Prior to Admission medications   Medication Sig Start Date End Date Taking? Authorizing Provider  ibuprofen (ADVIL,MOTRIN) 200 MG tablet Take 200 mg by mouth every 6 (six) hours as needed. For headache   Yes Historical Provider, MD   BP 112/83  Pulse 60  Temp(Src) 97.6 F (36.4 C) (Oral)  Resp 16  Ht 5\' 5"  (1.651 m)  Wt 250 lb (113.399 kg)  BMI 41.60 kg/m2  SpO2 99% Physical Exam  Constitutional: He is oriented to person, place, and time. He appears well-developed and well-nourished. No distress.  HENT:  Head: Normocephalic and atraumatic.  Right Ear: Hearing normal.  Left Ear: Hearing normal.  Nose: Nose normal.  Mouth/Throat: Oropharynx is clear and moist and mucous membranes are normal.  Eyes: Conjunctivae and EOM are normal. Pupils are equal, round, and reactive to light.  Neck: Normal range of motion. Neck supple.  Cardiovascular: Regular rhythm, S1 normal and S2 normal.  Exam reveals no gallop and no friction rub.   No murmur heard. Pulmonary/Chest: Effort normal and breath sounds normal. No respiratory distress. He exhibits  no tenderness.  Abdominal: Soft. Normal appearance and bowel sounds are normal. There is no hepatosplenomegaly. There is no tenderness. There is no rebound, no guarding, no tenderness at McBurney's point and negative Murphy's sign. No hernia.  Musculoskeletal: Normal range of motion.  Neurological: He is alert and oriented to person, place, and time. He has normal strength. No cranial nerve deficit or sensory deficit. Coordination normal. GCS eye subscore is 4. GCS verbal subscore is 5. GCS motor subscore is 6.  Skin: Skin is warm, dry and intact. No rash  noted. No cyanosis.  Psychiatric: He has a normal mood and affect. His speech is normal and behavior is normal. Thought content normal.    ED Course  Procedures (including critical care time) Labs Review Labs Reviewed  CBC WITH DIFFERENTIAL  BASIC METABOLIC PANEL  TROPONIN I  CBG MONITORING, ED    Imaging Review No results found.   EKG Interpretation   Date/Time:  Tuesday April 07 2014 14:25:39 EDT Ventricular Rate:  68 PR Interval:  142 QRS Duration: 90 QT Interval:  390 QTC Calculation: 414 R Axis:   15 Text Interpretation:  Normal sinus rhythm Normal ECG Confirmed by POLLINA   MD, CHRISTOPHER [phone]) on 04/07/2014 4:16:36 PM      MDM   Final diagnoses:  None   heat exhaustion  Palpitation  Headache, caffeine withdrawal  Presents to the ER for evaluation of multiple complaints. Patient worked outside in the heat 4 days ago and likely had some mild heat exhaustion. His complaints of shortness of breath and palpitations are intermittent. His cardiac workup is negative. EKG is unremarkable. Patient's vital signs are normal. Oxygenation 100%, normal blood pressure, no tachycardia. No concern for PE in this patient who is experiencing intermittent symptoms.  Patient did stop drinking significant amounts of caffeine 2 days ago. This is likely contributing to his palpitation symptoms, but certainly is causing his headache. He has a nonfocal, essentially normal neurologic exam. No neuro imaging as necessary.  Patient was administered IV fluids. Will be discharged with symptomatic treatment for his headaches. He was given the option of adding back some caffeine to his daily routine and slowly tapering off versus continuing his cold [place] quitting it and treating the headaches.    Orpah Greek, MD 04/07/14 [phone]

## 2014-04-15 ENCOUNTER — Emergency Department (HOSPITAL_COMMUNITY): Payer: Self-pay

## 2014-04-15 ENCOUNTER — Emergency Department (HOSPITAL_COMMUNITY)
Admission: EM | Admit: 2014-04-15 | Discharge: 2014-04-15 | Disposition: A | Discharge status: Home / Self Care | Payer: Self-pay | Attending: Emergency Medicine | Admitting: Emergency Medicine

## 2014-04-15 ENCOUNTER — Encounter (HOSPITAL_COMMUNITY): Payer: Self-pay | Admitting: Emergency Medicine

## 2014-04-15 DIAGNOSIS — R059 Cough, unspecified: Secondary | ICD-10-CM | POA: Insufficient documentation

## 2014-04-15 DIAGNOSIS — R51 Headache: Secondary | ICD-10-CM | POA: Insufficient documentation

## 2014-04-15 DIAGNOSIS — R079 Chest pain, unspecified: Secondary | ICD-10-CM | POA: Insufficient documentation

## 2014-04-15 DIAGNOSIS — R3919 Other difficulties with micturition: Secondary | ICD-10-CM | POA: Insufficient documentation

## 2014-04-15 DIAGNOSIS — R002 Palpitations: Secondary | ICD-10-CM | POA: Insufficient documentation

## 2014-04-15 DIAGNOSIS — R0602 Shortness of breath: Secondary | ICD-10-CM | POA: Insufficient documentation

## 2014-04-15 DIAGNOSIS — F17200 Nicotine dependence, unspecified, uncomplicated: Secondary | ICD-10-CM | POA: Insufficient documentation

## 2014-04-15 DIAGNOSIS — Z85828 Personal history of other malignant neoplasm of skin: Secondary | ICD-10-CM | POA: Insufficient documentation

## 2014-04-15 DIAGNOSIS — Z8719 Personal history of other diseases of the digestive system: Secondary | ICD-10-CM | POA: Insufficient documentation

## 2014-04-15 DIAGNOSIS — M542 Cervicalgia: Secondary | ICD-10-CM | POA: Insufficient documentation

## 2014-04-15 DIAGNOSIS — Z8619 Personal history of other infectious and parasitic diseases: Secondary | ICD-10-CM | POA: Insufficient documentation

## 2014-04-15 DIAGNOSIS — R000 Tachycardia, unspecified: Secondary | ICD-10-CM | POA: Insufficient documentation

## 2014-04-15 DIAGNOSIS — R05 Cough: Secondary | ICD-10-CM | POA: Insufficient documentation

## 2014-04-15 DIAGNOSIS — R34 Anuria and oliguria: Secondary | ICD-10-CM | POA: Insufficient documentation

## 2014-04-15 LAB — PRO B NATRIURETIC PEPTIDE: PRO B NATRI PEPTIDE: 253.4 pg/mL — AB (ref 0–125)

## 2014-04-15 LAB — CBC WITH DIFFERENTIAL/PLATELET
BASOS PCT: 0 % (ref 0–1)
Basophils Absolute: 0 10*3/uL (ref 0.0–0.1)
EOS ABS: 0.1 10*3/uL (ref 0.0–0.7)
EOS PCT: 2 % (ref 0–5)
HCT: 46.5 % (ref 39.0–52.0)
Hemoglobin: 16.6 g/dL (ref 13.0–17.0)
LYMPHS ABS: 1.7 10*3/uL (ref 0.7–4.0)
Lymphocytes Relative: 25 % (ref 12–46)
MCH: 33.2 pg (ref 26.0–34.0)
MCHC: 35.7 g/dL (ref 30.0–36.0)
MCV: 93 fL (ref 78.0–100.0)
Monocytes Absolute: 0.5 10*3/uL (ref 0.1–1.0)
Monocytes Relative: 8 % (ref 3–12)
Neutro Abs: 4.4 10*3/uL (ref 1.7–7.7)
Neutrophils Relative %: 65 % (ref 43–77)
PLATELETS: 137 10*3/uL — AB (ref 150–400)
RBC: 5 MIL/uL (ref 4.22–5.81)
RDW: 13.3 % (ref 11.5–15.5)
WBC: 6.8 10*3/uL (ref 4.0–10.5)

## 2014-04-15 LAB — COMPREHENSIVE METABOLIC PANEL
ALK PHOS: 23 U/L — AB (ref 39–117)
ALT: 37 U/L (ref 0–53)
AST: 23 U/L (ref 0–37)
Albumin: 4.1 g/dL (ref 3.5–5.2)
Anion gap: 13 (ref 5–15)
BUN: 23 mg/dL (ref 6–23)
CALCIUM: 9 mg/dL (ref 8.4–10.5)
CO2: 23 mEq/L (ref 19–32)
Chloride: 101 mEq/L (ref 96–112)
Creatinine, Ser: 0.92 mg/dL (ref 0.50–1.35)
GFR calc non Af Amer: >90 mL/min (ref >90)
GLUCOSE: 109 mg/dL — AB (ref 70–99)
POTASSIUM: 4.1 meq/L (ref 3.7–5.3)
SODIUM: 137 meq/L (ref 137–147)
TOTAL PROTEIN: 7.2 g/dL (ref 6.0–8.3)
Total Bilirubin: 1.7 mg/dL — ABNORMAL HIGH (ref 0.3–1.2)

## 2014-04-15 LAB — TROPONIN I: Troponin I: <0.3 ng/mL (ref <0.30)

## 2014-04-15 LAB — TSH: TSH: 1.14 u[IU]/mL (ref 0.350–4.500)

## 2014-04-15 MED ORDER — SODIUM CHLORIDE 0.9 % IV BOLUS (SEPSIS)
500.0000 mL | Freq: Once | INTRAVENOUS | Status: AC
  Administered 2014-04-15: 500 mL via INTRAVENOUS

## 2014-04-15 MED ORDER — SODIUM CHLORIDE 0.9 % IV SOLN
INTRAVENOUS | Status: DC
Start: 2014-04-15 — End: 2014-04-15
  Administered 2014-04-15: 09:00:00 via INTRAVENOUS

## 2014-04-15 NOTE — ED Notes (Signed)
Pt ambulated without difficulty to bathroom. Pt reported shortness of breath with ambulation, and reports that shortness of breath subsides with rest. Pt A&O and in NAD.

## 2014-04-15 NOTE — Discharge Instructions (Signed)
Aspirin and Your Heart Aspirin affects the way your blood clots and helps "thin" the blood. Aspirin has many uses in heart disease. It may be used as a primary prevention to help reduce the risk of heart related events. It also can be used as a secondary measure to prevent more heart attacks or to prevent additional damage from blood clots.  ASPIRIN MAY HELP IF YOU:  Have had a heart attack or chest pain.  Have undergone open heart surgery such as CABG (Coronary Artery Bypass Surgery).  Have had coronary angioplasty with or without stents.  Have experienced a stroke or TIA (transient ischemic attack).  Have peripheral vascular disease (PAD).  Have chronic heart rhythm problems such as atrial fibrillation.  Are at risk for heart disease. BEFORE STARTING ASPIRIN Before you start taking aspirin, your caregiver will need to review your medical history. Many things will need to be taken into consideration, such as:  Smoking status.  Blood pressure.  Diabetes.  Gender.  Weight.  Cholesterol level. ASPIRIN DOSES  Aspirin should only be taken on the advice of your caregiver. Talk to your caregiver about how much aspirin you should take. Aspirin comes in different doses such as:  81 mg.  162 mg.  325 mg.  The aspirin dose you take may be affected by many factors, some of which include:  Your current medications, especially if your are taking blood-thinners or anti-platelet medicine.  Liver function.  Heart disease risk.  Age.  Aspirin comes in two forms:  Non-enteric-coated. This type of aspirin does not have a coating and is absorbed faster. Non-enteric coated aspirin is recommended for patients experiencing chest pain symptoms. This type of aspirin also comes in a chewable form.  Enteric-coated. This means the aspirin has a special coating that releases the medicine very slowly. Enteric-coated aspirin causes less stomach upset. This type of aspirin should not be chewed  or crushed. ASPIRIN SIDE EFFECTS Daily use of aspirin can increase your risk of serious side effects. Some of these include:  Increased bleeding. This can range from a cut that does not stop bleeding to more serious problems such as stomach bleeding or bleeding into the brain (Intracerebral bleeding).  Increased bruising.  Stomach upset.  An allergic reaction such as red, itchy skin.  Increased risk of bleeding when combined with non-steroidal anti-inflammatory medicine (NSAIDS).  Alcohol should be drank in moderation when taking aspirin. Alcohol can increase the risk of stomach bleeding when taken with aspirin.  Aspirin should not be given to children less than 68 years of age due to the association of Reye syndrome. Reye syndrome is a serious illness that can affect the brain and liver. Studies have linked Reye syndrome with aspirin use in children.  People that have nasal polyps have an increased risk of developing an aspirin allergy. SEEK MEDICAL CARE IF:   You develop an allergic reaction such as:  Hives.  Itchy skin.  Swelling of the lips, tongue or face.  You develop stomach pain.  You have unusual bleeding or bruising.  You have ringing in your ears. SEEK IMMEDIATE MEDICAL CARE IF:   You have severe chest pain, especially if the pain is crushing or pressure-like and spreads to the arms, back, neck, or jaw. THIS IS AN EMERGENCY. Do not wait to see if the pain will go away. Get medical help at once. Call your local emergency services (911 in the [place] DO NOT drive yourself to the hospital.  You have stroke-like symptoms  such as:  Loss of vision.  Difficulty talking.  Numbness or weakness on one side of your body.  Numbness or weakness in your arm or leg.  Not thinking clearly or feeling confused.  Your bowel movements are bloody, dark red or black in color.  You vomit or cough up blood.  You have blood in your urine.  You have shortness of breath,  coughing or wheezing. MAKE SURE YOU:   Understand these instructions.  Will monitor your condition.  Seek immediate medical care if necessary. Document Released: 08/24/2008 Document Revised: 01/06/2013 Document Reviewed: 08/24/2008 [facility] Patient Information 2015 [place], [place]. This information is not intended to replace advice given to you by your health care provider. Make sure you discuss any questions you have with your health care provider.  Recommend starting a baby aspirin a day. Make employment followup with cardiology. Return for recurrent rapid heart rate lasting 40 minutes or longer. Return for any new or worse symptoms. Followup with cardiology they may be due to some Holter monitoring to see why and how often the palpitations are occurring.

## 2014-04-15 NOTE — ED Notes (Signed)
Neck tightness, sob with activity, "lightheaded" x 3 days. Hx of heart problems. Pt alert/oriented. nad at this time. Nondiaphoretic. States hr up to 130-140s.

## 2014-04-15 NOTE — ED Provider Notes (Signed)
CSN: [id number]     Arrival date & time 04/15/14  0177 History  This chart was scribed for Fredia Sorrow, MD by Peyton Bottoms, ED Scribe. This patient was seen in room APA04/APA04 and the patient's care was started at 8:01 AM.   Chief Complaint  Patient presents with  . Chest Pain   Patient is a 53 y.o. male presenting with chest pain. The history is provided by the patient. No language interpreter was used.  Chest Pain Pain location:  Substernal area Pain quality: tightness   Pain radiates to:  Neck Pain radiates to the back: no   Pain severity:  Moderate Onset quality:  Gradual Duration:  3 days Timing:  Intermittent Chronicity:  Recurrent Context comment:  Recent cessation of heavy caffeine intake Relieved by:  Rest Worsened by:  Movement Ineffective treatments:  None tried Associated symptoms: cough, headache, palpitations and shortness of breath   Associated symptoms: no abdominal pain, no back pain, no fever, no lower extremity edema, no nausea and not vomiting   Risk factors: male sex and smoking    HPI Comments: Thomas Ball is a 53 y.o. male who presents to the Emergency Department complaining of intermittent "heart racing" palpitations for the past week. Patient states associated intermittent substernal chest discomfort onset 3 days ago that radiates up to neck, as well as associated SOB. Pt describes his chest pain as tightness and as heaviness. Patient rates pain as 5/10 at worst with no current pain.  Patient states pain is increased with movement. Patient was seen on 04/07/14 for similar complaints. Patient is not currently taking any medications.  [facility]   Past Medical History  Diagnosis Date  . GERD (gastroesophageal reflux disease)   . HCV (hepatitis C virus) DR. ZACKS    AUG 2013 VIRAL LOAD UNDETECTABLE  . Skin cancer 1992    removed from side of head  . Pancreatitis, gallstone   . Tachycardia    Past Surgical History   Procedure Laterality Date  . Jaw broken    . Cholecystectomy    . Lipoma removals      SEVERAL  . Colonoscopy  10/04/2012    LTJ:QZESP sessile polyps measuring 3-8 mm/Moderate diverticulosis/Small internal hemorrhoids   Family History  Problem Relation Age of Onset  . Colon cancer Neg Hx    History  Substance Use Topics  . 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 Smokeless tobacco: Never Used     Comment: had quit for 9 mos, picked up again 2 mos ago  . Alcohol Use: No    Review of Systems  Constitutional: Negative for fever and chills.  HENT: Negative for rhinorrhea and sore throat.   Eyes: Negative.  Negative for visual disturbance.  Respiratory: Positive for cough and shortness of breath.   Cardiovascular: Positive for chest pain and palpitations. Negative for leg swelling.  Gastrointestinal: Negative for nausea, vomiting, abdominal pain and diarrhea.  Genitourinary: Positive for difficulty urinating. Negative for dysuria.  Musculoskeletal: Positive for neck pain. Negative for back pain.  Skin: Negative for rash.  Neurological: Positive for headaches.  Hematological: Does not bruise/bleed easily.  Psychiatric/Behavioral: Negative for confusion.  All other systems reviewed and are negative.  Allergies  Review of patient's allergies indicates no known allergies.  Home Medications   Prior to Admission medications   Medication Sig Start Date End Date Taking? Authorizing Provider  ibuprofen (ADVIL,MOTRIN) 800 MG tablet Take 1 tablet (800 mg total) by mouth 3 (three) times daily. 04/07/14  Yes Orpah Greek, MD   Triage Vitals BP 133/90  Pulse 70  Temp(Src) 98.1 F (36.7 C) (Oral)  Resp 20  Ht 5\' 5"  (1.651 m)  Wt 249 lb (112.946 kg)  BMI 41.44 kg/m2  SpO2 96%  Physical Exam  Nursing note and vitals reviewed. Constitutional: He is oriented to person, place, and time. He appears well-developed and  well-nourished. No distress.  HENT:  Head: Normocephalic and atraumatic.  Eyes: Conjunctivae and EOM are normal.  Neck: Neck supple. No tracheal deviation present.  Cardiovascular: Normal rate and regular rhythm.   No murmur heard. Pulmonary/Chest: Effort normal and breath sounds normal. No respiratory distress. He has no wheezes. He has no rales.  Abdominal: Soft. Bowel sounds are normal. There is no tenderness.  Musculoskeletal: Normal range of motion. He exhibits no edema.  No swelling in ankles  Neurological: He is alert and oriented to person, place, and time.  Skin: Skin is warm and dry.  Psychiatric: He has a normal mood and affect. His behavior is normal.    ED Course  Procedures (including critical care time)  DIAGNOSTIC STUDIES: Oxygen Saturation is 96% on RA, Adequate by my interpretation.    COORDINATION OF CARE: 8:07 AM- Discussed plan to order a CXR and lab work. Pt advised of plan for treatment. Pt verbalizes understanding and agreement with plan.  Medications  0.9 %  sodium chloride infusion ( Intravenous New Bag/Given 04/15/14 0901)  sodium chloride 0.9 % bolus 500 mL (0 mLs Intravenous Stopped 04/15/14 0826)  sodium chloride 0.9 % bolus 500 mL (0 mLs Intravenous Stopped 04/15/14 0900)    Results for orders placed during the hospital encounter of 04/15/14  CBC WITH DIFFERENTIAL      Result Value Ref Range   WBC 6.8  4.0 - 10.5 K/uL   RBC 5.00  4.22 - 5.81 MIL/uL   Hemoglobin 16.6  13.0 - 17.0 g/dL   HCT 46.5  39.0 - 52.0 %   MCV 93.0  78.0 - 100.0 fL   MCH 33.2  26.0 - 34.0 pg   MCHC 35.7  30.0 - 36.0 g/dL   RDW 13.3  11.5 - 15.5 %   Platelets 137 (*) 150 - 400 K/uL   Neutrophils Relative % 65  43 - 77 %   Neutro Abs 4.4  1.7 - 7.7 K/uL   Lymphocytes Relative 25  12 - 46 %   Lymphs Abs 1.7  0.7 - 4.0 K/uL   Monocytes Relative 8  3 - 12 %   Monocytes Absolute 0.5  0.1 - 1.0 K/uL   Eosinophils Relative 2  0 - 5 %   Eosinophils Absolute 0.1  0.0 - 0.7 K/uL    Basophils Relative 0  0 - 1 %   Basophils Absolute 0.0  0.0 - 0.1 K/uL  COMPREHENSIVE METABOLIC PANEL      Result Value Ref Range   Sodium 137  137 - 147 mEq/L   Potassium 4.1  3.7 - 5.3 mEq/L   Chloride 101  96 - 112 mEq/L   CO2 23  19 - 32 mEq/L   Glucose, Bld 109 (*) 70 - 99 mg/dL   BUN 23  6 - 23 mg/dL   Creatinine, Ser 0.92  0.50 - 1.35 mg/dL   Calcium 9.0  8.4 - 10.5 mg/dL   Total Protein 7.2  6.0 - 8.3  g/dL   Albumin 4.1  3.5 - 5.2 g/dL   AST 23  0 - 37 U/L   ALT 37  0 - 53 U/L   Alkaline Phosphatase 23 (*) 39 - 117 U/L   Total Bilirubin 1.7 (*) 0.3 - 1.2 mg/dL   GFR calc non Af Amer >90  >90 mL/min   GFR calc Af Amer >90  >90 mL/min   Anion gap 13  5 - 15  TROPONIN I      Result Value Ref Range   Troponin I <0.30  <0.30 ng/mL  PRO B NATRIURETIC PEPTIDE      Result Value Ref Range   Pro B Natriuretic peptide (BNP) 253.4 (*) 0 - 125 pg/mL    Imaging Review Dg Chest Portable 1 View  04/15/2014   CLINICAL DATA:  Chest pain.  EXAM: PORTABLE CHEST - 1 VIEW  COMPARISON:  11/01/2013  FINDINGS: Cardiac silhouette is mildly enlarged. Normal mediastinal and hilar contours. There are mild thickened interstitial markings bilaterally, stable. Stable left upper lobe nodule. No lung consolidation or edema. No pleural effusion or pneumothorax.  Bony thorax is grossly intact.  IMPRESSION: No acute cardiopulmonary disease.   Electronically Signed   By: Lajean Manes M.D.   On: 04/15/2014 08:22     EKG Interpretation   Date/Time:  Wednesday April 15 2014 07:46:38 EDT Ventricular Rate:  120 PR Interval:    QRS Duration: 90 QT Interval:  342 QTC Calculation: 483 R Axis:   32 Text Interpretation:  Junctional tachycardia Borderline prolonged QT  interval New since previous tracing Confirmed by Angelica Frandsen  MD, Shanta Hartner  [phone]) on 04/15/2014 7:57:50 AM      MDM   Final diagnoses:  Chest pain, unspecified chest pain type  Rapid palpitations    Patient with a junctional type  tachycardia intermittently upon arrival during monitoring here is essentially stopped sinus rhythm currently. Workup for the chest pain that has been present for several days. Negative troponin chest x-ray negative for pneumonia pneumothorax or pulmonary edema. EKG with a junctional tachycardia. No evidence of any acute cardiac event. Patient will be discharged home followup with cardiology. Referral provided. Labs without significant abnormalities. No anemia no leukocytosis no significant electrolyte abnormalities.  I personally performed the services described in this documentation, which was scribed in my presence. The recorded information has been reviewed and is accurate.       Fredia Sorrow, MD 04/15/14 1159

## 2014-04-17 ENCOUNTER — Encounter (HOSPITAL_COMMUNITY): Payer: Self-pay | Admitting: Emergency Medicine

## 2014-04-17 ENCOUNTER — Emergency Department (HOSPITAL_COMMUNITY)
Admission: EM | Admit: 2014-04-17 | Discharge: 2014-04-17 | Disposition: A | Discharge status: Home / Self Care | Payer: Self-pay | Attending: Emergency Medicine | Admitting: Emergency Medicine

## 2014-04-17 DIAGNOSIS — Z79899 Other long term (current) drug therapy: Secondary | ICD-10-CM | POA: Insufficient documentation

## 2014-04-17 DIAGNOSIS — Z8619 Personal history of other infectious and parasitic diseases: Secondary | ICD-10-CM | POA: Insufficient documentation

## 2014-04-17 DIAGNOSIS — R002 Palpitations: Secondary | ICD-10-CM | POA: Insufficient documentation

## 2014-04-17 DIAGNOSIS — R000 Tachycardia, unspecified: Secondary | ICD-10-CM | POA: Insufficient documentation

## 2014-04-17 DIAGNOSIS — R079 Chest pain, unspecified: Secondary | ICD-10-CM | POA: Insufficient documentation

## 2014-04-17 DIAGNOSIS — Z85828 Personal history of other malignant neoplasm of skin: Secondary | ICD-10-CM | POA: Insufficient documentation

## 2014-04-17 DIAGNOSIS — Z8719 Personal history of other diseases of the digestive system: Secondary | ICD-10-CM | POA: Insufficient documentation

## 2014-04-17 DIAGNOSIS — F17200 Nicotine dependence, unspecified, uncomplicated: Secondary | ICD-10-CM | POA: Insufficient documentation

## 2014-04-17 MED ORDER — ATENOLOL 25 MG PO TABS
25.0000 mg | ORAL_TABLET | Freq: Once | ORAL | Status: AC
  Administered 2014-04-17: 25 mg via ORAL
  Filled 2014-04-17: qty 1

## 2014-04-17 MED ORDER — ATENOLOL 25 MG PO TABS: 25.0000 mg | ORAL_TABLET | Freq: Every day | ORAL | Status: DC

## 2014-04-17 NOTE — ED Notes (Signed)
Patient states that he has been having chest pain (tingling feeling and fast heart rate for the past week. States that he was seen in the ED recently but pain has not stopped.

## 2014-04-17 NOTE — Discharge Instructions (Signed)
Stop smoking.  Try to work on your weight.  Start new medication at bedtime called atenolol 25 mg.   Call the cardiologist on Monday for a followup appointment next week.  Return if worse or consider going to [facility] where our cardiology unit is located.

## 2014-04-17 NOTE — ED Provider Notes (Signed)
CSN: [id number]     Arrival date & time 04/17/14  1510 History  This chart was scribed for Nat Christen, MD by Vernell Barrier, ED scribe. This patient was seen in room APA07/APA07 and the patient's care was started at 3:47 PM.    Chief Complaint  Patient presents with  . Chest Pain   The history is provided by the patient. No language interpreter was used.   HPI Comments: Thomas Ball is a 53 y.o. male who presents to the Emergency Department complaining of intermittent heart palpitations w/ associated SOB; initial onset 2 weeks ago, worsened over the past 3 days. Notes occasionally the feeling will radiate up to his neck and create a "funny" feeling in his chest. Heart feels as if it is racing. Several episodes per day lasting approximately 1-2 minutes at a time. Worse with ambulation or exertion. Reports having a HR monitor he slips on his finger when he feels his HR going up; notes 130 at its highest. States he quit caffeine 2 weeks ago. Was drinking 10-11 [facility] daily. Shortly after reports onset of HA and heart racing. Seen here 2 days ago for same sxs. Told to take it easy upon discharge and spend the following day relaxing. Currently working with [facility] to be seen by a cardiologist. Quit smoking last year for 9 months but has since started smoking again. Admits to a pack per day. Was being followed by [facility] at time of cardiac workup. Stress test completed that showed normal. Pt works in [employer].  Past Medical History  Diagnosis Date  . GERD (gastroesophageal reflux disease)   . HCV (hepatitis C virus) DR. ZACKS    AUG 2013 VIRAL LOAD UNDETECTABLE  . Skin cancer 1992    removed from side of head  . Pancreatitis, gallstone   . Tachycardia    Past Surgical History  Procedure Laterality Date  . Jaw broken    . Cholecystectomy    . Lipoma removals      SEVERAL  . Colonoscopy  10/04/2012    EQA:STMHD sessile polyps measuring 3-8 mm/Moderate diverticulosis/Small internal  hemorrhoids   Family History  Problem Relation Age of Onset  . Colon cancer Neg Hx    History  Substance Use Topics  . 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 Smokeless tobacco: Never Used     Comment: had quit for 9 mos, picked up again 2 mos ago  . Alcohol Use: No    Review of Systems  Cardiovascular: Positive for palpitations. Negative for chest pain.   A complete 10 system review of systems was obtained and all systems are negative except as noted in the HPI and PMH.   Allergies  Review of patient's allergies indicates no known allergies.  Home Medications   Prior to Admission medications   Medication Sig Start Date End Date Taking? Authorizing Provider  ibuprofen (ADVIL,MOTRIN) 800 MG tablet Take 1 tablet (800 mg total) by mouth 3 (three) times daily. 04/07/14  Yes Orpah Greek, MD  atenolol (TENORMIN) 25 MG tablet Take 1 tablet (25 mg total) by mouth daily. 04/17/14   Nat Christen, MD   Triage vitals: BP 138/83  Pulse 78  Temp(Src) 98.6 F (37 C) (Oral)  Resp 18  Ht 5\' 5"  (1.651 m)  Wt 249 lb (112.946 kg)  BMI 41.44 kg/m2  SpO2 97%  Physical Exam  Nursing note and vitals reviewed. Constitutional: He is  oriented to person, place, and time. He appears well-developed and well-nourished.  HENT:  Head: Normocephalic and atraumatic.  Eyes: Conjunctivae and EOM are normal. Pupils are equal, round, and reactive to light.  Neck: Normal range of motion. Neck supple.  Cardiovascular: Normal rate, regular rhythm and normal heart sounds.   Pulmonary/Chest: Effort normal and breath sounds normal.  Abdominal: Soft. Bowel sounds are normal.  Musculoskeletal: Normal range of motion.  Neurological: He is alert and oriented to person, place, and time.  Skin: Skin is warm and dry.  Psychiatric: He has a normal mood and affect. His behavior is normal.    ED Course  Procedures (including critical care  time) DIAGNOSTIC STUDIES: Oxygen Saturation is 97% on room air, normal by my interpretation.    COORDINATION OF CARE: 3:54 PM: 90 second episode of sinus tacy rate of 115 noted while being interviewed.  At 3:55 PM: Discussed treatment plan with patient which includes consultation with a cardiologist for further evaluation. Patient agrees.    Labs Review Labs Reviewed - No data to display  Imaging Review No results found.   EKG Interpretation   Date/Time:  Friday April 17 2014 15:20:43 EDT Ventricular Rate:  75 PR Interval:  117 QRS Duration: 81 QT Interval:  371 QTC Calculation: 414 R Axis:   69 Text Interpretation:  Sinus rhythm Ventricular premature complex  Borderline short PR interval Low voltage, precordial leads Confirmed by  Errol Ala  MD, Kamil Hanigan ([id number]) on 04/17/2014 4:13:01 PM      MDM   Final diagnoses:  Tachycardia   Patient had workup for similar symptoms recently. He is hemodynamically stable. Discussed with cardiologist in [facility] Dr Wilhemina Cash.  Rx atenolol 25 mg at bedtime. Will followup with [facility] cardiologist next week. He is instructed return if worse in any way. I personally performed the services described in this documentation, which was scribed in my presence. The recorded information has been reviewed and is accurate.     Nat Christen, MD 04/18/14 [phone]

## 2014-04-17 NOTE — ED Notes (Signed)
Patient with no complaints at this time. Respirations even and unlabored. Skin warm/dry. Discharge instructions reviewed with patient at this time. Patient given opportunity to voice concerns/ask questions. Patient discharged at this time and left Emergency Department with steady gait.   

## 2014-04-20 ENCOUNTER — Encounter (HOSPITAL_COMMUNITY): Payer: Self-pay | Admitting: Emergency Medicine

## 2014-04-20 ENCOUNTER — Observation Stay (HOSPITAL_COMMUNITY)
Admission: EM | Admit: 2014-04-20 | Discharge: 2014-04-22 | Disposition: A | Discharge status: Home / Self Care | Payer: Self-pay | Attending: Internal Medicine | Admitting: Internal Medicine

## 2014-04-20 ENCOUNTER — Emergency Department (HOSPITAL_COMMUNITY): Payer: Self-pay

## 2014-04-20 DIAGNOSIS — K219 Gastro-esophageal reflux disease without esophagitis: Secondary | ICD-10-CM | POA: Diagnosis present

## 2014-04-20 DIAGNOSIS — R0789 Other chest pain: Principal | ICD-10-CM | POA: Insufficient documentation

## 2014-04-20 DIAGNOSIS — I471 Supraventricular tachycardia, unspecified: Secondary | ICD-10-CM | POA: Diagnosis present

## 2014-04-20 DIAGNOSIS — K8020 Calculus of gallbladder without cholecystitis without obstruction: Secondary | ICD-10-CM | POA: Insufficient documentation

## 2014-04-20 DIAGNOSIS — R002 Palpitations: Secondary | ICD-10-CM | POA: Diagnosis present

## 2014-04-20 DIAGNOSIS — K2100 Gastro-esophageal reflux disease with esophagitis, without bleeding: Secondary | ICD-10-CM

## 2014-04-20 DIAGNOSIS — I959 Hypotension, unspecified: Secondary | ICD-10-CM | POA: Clinically undetermined

## 2014-04-20 DIAGNOSIS — R079 Chest pain, unspecified: Secondary | ICD-10-CM | POA: Diagnosis present

## 2014-04-20 DIAGNOSIS — R001 Bradycardia, unspecified: Secondary | ICD-10-CM | POA: Clinically undetermined

## 2014-04-20 DIAGNOSIS — R911 Solitary pulmonary nodule: Secondary | ICD-10-CM

## 2014-04-20 DIAGNOSIS — F17200 Nicotine dependence, unspecified, uncomplicated: Secondary | ICD-10-CM | POA: Insufficient documentation

## 2014-04-20 DIAGNOSIS — K55059 Acute (reversible) ischemia of intestine, part and extent unspecified: Secondary | ICD-10-CM | POA: Insufficient documentation

## 2014-04-20 DIAGNOSIS — Z79899 Other long term (current) drug therapy: Secondary | ICD-10-CM | POA: Insufficient documentation

## 2014-04-20 DIAGNOSIS — Z8619 Personal history of other infectious and parasitic diseases: Secondary | ICD-10-CM | POA: Insufficient documentation

## 2014-04-20 DIAGNOSIS — I498 Other specified cardiac arrhythmias: Secondary | ICD-10-CM | POA: Insufficient documentation

## 2014-04-20 DIAGNOSIS — E669 Obesity, unspecified: Secondary | ICD-10-CM | POA: Insufficient documentation

## 2014-04-20 DIAGNOSIS — J449 Chronic obstructive pulmonary disease, unspecified: Secondary | ICD-10-CM

## 2014-04-20 DIAGNOSIS — Z1212 Encounter for screening for malignant neoplasm of rectum: Secondary | ICD-10-CM

## 2014-04-20 DIAGNOSIS — Z1211 Encounter for screening for malignant neoplasm of colon: Secondary | ICD-10-CM

## 2014-04-20 DIAGNOSIS — I951 Orthostatic hypotension: Secondary | ICD-10-CM

## 2014-04-20 DIAGNOSIS — Z85828 Personal history of other malignant neoplasm of skin: Secondary | ICD-10-CM | POA: Insufficient documentation

## 2014-04-20 HISTORY — DX: Supraventricular tachycardia: I47.1

## 2014-04-20 HISTORY — DX: Supraventricular tachycardia, unspecified: I47.10

## 2014-04-20 LAB — BASIC METABOLIC PANEL
Anion gap: 9 (ref 5–15)
BUN: 16 mg/dL (ref 6–23)
CO2: 26 mEq/L (ref 19–32)
Calcium: 8.6 mg/dL (ref 8.4–10.5)
Chloride: 104 mEq/L (ref 96–112)
Creatinine, Ser: 0.8 mg/dL (ref 0.50–1.35)
Glucose, Bld: 113 mg/dL — ABNORMAL HIGH (ref 70–99)
POTASSIUM: 4.5 meq/L (ref 3.7–5.3)
SODIUM: 139 meq/L (ref 137–147)

## 2014-04-20 LAB — CBC
HCT: 44.2 % (ref 39.0–52.0)
HEMOGLOBIN: 15.5 g/dL (ref 13.0–17.0)
MCH: 33.3 pg (ref 26.0–34.0)
MCHC: 35.1 g/dL (ref 30.0–36.0)
MCV: 94.8 fL (ref 78.0–100.0)
PLATELETS: 159 10*3/uL (ref 150–400)
RBC: 4.66 MIL/uL (ref 4.22–5.81)
RDW: 13.3 % (ref 11.5–15.5)
WBC: 8 10*3/uL (ref 4.0–10.5)

## 2014-04-20 LAB — I-STAT TROPONIN, ED: TROPONIN I, POC: 0 ng/mL (ref 0.00–0.08)

## 2014-04-20 LAB — PROTIME-INR
INR: 1.04 (ref 0.00–1.49)
Prothrombin Time: 13.6 seconds (ref 11.6–15.2)

## 2014-04-20 LAB — APTT: aPTT: 28 seconds (ref 24–37)

## 2014-04-20 LAB — PRO B NATRIURETIC PEPTIDE: Pro B Natriuretic peptide (BNP): 314.3 pg/mL — ABNORMAL HIGH (ref 0–125)

## 2014-04-20 MED ORDER — SODIUM CHLORIDE 0.9 % IV SOLN
1000.0000 mL | INTRAVENOUS | Status: DC
  Administered 2014-04-20: 1000 mL via INTRAVENOUS

## 2014-04-20 MED ORDER — NITROGLYCERIN 2 % TD OINT
1.0000 [in_us] | TOPICAL_OINTMENT | Freq: Once | TRANSDERMAL | Status: AC
  Administered 2014-04-20: 1 [in_us] via TOPICAL
  Filled 2014-04-20: qty 1

## 2014-04-20 MED ORDER — ASPIRIN 81 MG PO CHEW: 324.0000 mg | CHEWABLE_TABLET | Freq: Once | ORAL | Status: DC

## 2014-04-20 MED ORDER — ASPIRIN 325 MG PO TABS
325.0000 mg | ORAL_TABLET | ORAL | Status: AC
  Administered 2014-04-20: 325 mg via ORAL
  Filled 2014-04-20: qty 1

## 2014-04-20 NOTE — ED Provider Notes (Signed)
CSN: [id number]     Arrival date & time 04/20/14  2135 History   First MD Initiated Contact with Patient 04/20/14 2152     Chief Complaint  Patient presents with  . Chest Pain    The patient said he has been having chest pain for several weeks.  He said he went to [employer] [employer] and gave him a beat blocker due to his heart rate being elevated.  He advised his chest pain has been intermittent.    HPI Pt has been noticing trouble with tightness and tingling in his chest.  Over the last couple of weeks he has had trouble with palpitations.  He has noticed tightness with that.  It sometimes will last for hours at a time.  Nothing seems to bring it on in particular.  This all started after he quit drinking caffeine a couple of weeks ago.  He has been seen at [facility] a couple of times for this.  He was treated and released.  He was started on beta blockers.    He had another pain tonight so he decided to come to the ED. Past Medical History  Diagnosis Date  . GERD (gastroesophageal reflux disease)   . HCV (hepatitis C virus) DR. ZACKS    AUG 2013 VIRAL LOAD UNDETECTABLE  . Skin cancer 1992    removed from side of head  . Pancreatitis, gallstone   . Tachycardia    Past Surgical History  Procedure Laterality Date  . Jaw broken    . Cholecystectomy    . Lipoma removals      SEVERAL  . Colonoscopy  10/04/2012    NWG:NFAOZ sessile polyps measuring 3-8 mm/Moderate diverticulosis/Small internal hemorrhoids   Family History  Problem Relation Age of Onset  . Colon cancer Neg Hx    History  Substance Use Topics  . 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 Smokeless tobacco: Never Used     Comment: had quit for 9 mos, picked up again 2 mos ago  . Alcohol Use: No    Review of Systems  All other systems reviewed and are negative.     Allergies  Review of patient's allergies indicates no known allergies.  Home Medications    Prior to Admission medications   Medication Sig Start Date End Date Taking? Authorizing Provider  atenolol (TENORMIN) 25 MG tablet Take 1 tablet (25 mg total) by mouth daily. 04/17/14  Yes Nat Christen, MD  ibuprofen (ADVIL,MOTRIN) 800 MG tablet Take 800 mg by mouth every 8 (eight) hours as needed for moderate pain.   Yes Historical Provider, MD   BP 121/72  Pulse 60  Temp(Src) 97.8 F (36.6 C) (Oral)  Resp 16  Ht 5\' 5"  (1.651 m)  Wt 250 lb (113.399 kg)  BMI 41.60 kg/m2  SpO2 97% Physical Exam  Nursing note and vitals reviewed. Constitutional: No distress.  Obese   HENT:  Head: Normocephalic and atraumatic.  Right Ear: External ear normal.  Left Ear: External ear normal.  Eyes: Conjunctivae are normal. Right eye exhibits no discharge. Left eye exhibits no discharge. No scleral icterus.  Neck: Neck supple. No tracheal deviation present.  Cardiovascular: Normal rate, regular rhythm and intact distal pulses.   Pulmonary/Chest: Effort normal and breath sounds normal. No stridor. No respiratory distress. He has no wheezes. He has no rales.  Abdominal: Soft. Bowel sounds are normal. He exhibits no distension. There is no  tenderness. There is no rebound and no guarding.  Musculoskeletal: He exhibits no edema and no tenderness.  Neurological: He is alert. He has normal strength. No cranial nerve deficit (no facial droop, extraocular movements intact, no slurred speech) or sensory deficit. He exhibits normal muscle tone. He displays no seizure activity. Coordination normal.  Skin: Skin is warm and dry. No rash noted. He is not diaphoretic.  Psychiatric: He has a normal mood and affect.    ED Course  Procedures (including critical care time) Labs Review Labs Reviewed  BASIC METABOLIC PANEL - Abnormal; Notable for the following:    Glucose, Bld 113 (*)    All other components within normal limits  PRO B NATRIURETIC PEPTIDE - Abnormal; Notable for the following:    Pro B Natriuretic  peptide (BNP) 314.3 (*)    All other components within normal limits  CBC  APTT  PROTIME-INR  I-STAT TROPOININ, ED  Randolm Idol, ED    Imaging Review Dg Chest 2 View  04/20/2014   CLINICAL DATA:  Chest pain.  EXAM: CHEST  2 VIEW  COMPARISON:  04/15/2014 and 11/01/2013 and chest CT dated 09/09/2013  FINDINGS: Heart size and pulmonary vascularity are normal. Lungs are clear except for calcified granuloma in the left midzone, unchanged since the prior CT scan. No effusions. No acute osseous abnormality.  IMPRESSION: No acute abnormality.   Electronically Signed   By: Rozetta Nunnery M.D.   On: 04/20/2014 23:08     EKG Interpretation   Date/Time:  Monday April 20 2014 21:39:06 EDT Ventricular Rate:  59 PR Interval:  136 QRS Duration: 84 QT Interval:  390 QTC Calculation: 386 R Axis:   43 Text Interpretation:  Sinus bradycardia Otherwise normal ECG No  significant change since last tracing Confirmed by Tim Corriher  MD-J, Candid Bovey  ([id number]) on 04/20/2014 9:48:17 PM      MDM   Final diagnoses:  Chest pain, unspecified chest pain type    Pt is having recurrent episodes of chest pain.  Heavy cigarette user.  Multiple visits to the ED with his chest pain.  Referred for outpatient eval but symptoms have returned.  Will consult with medical service regarding admission for rule out and stress testing.    Dorie Rank, MD 04/20/14 [phone]

## 2014-04-20 NOTE — ED Notes (Signed)
The patient said he has been having chest pain for several weeks.  He said he went to [employer] and gave him a beat blocker due to his heart rate being elevated.  He advised his chest pain has been intermittent.  The patient said his chest pain is back and it has gotten worse so he decided to come and be evaluated.

## 2014-04-21 ENCOUNTER — Other Ambulatory Visit (HOSPITAL_COMMUNITY): Payer: Self-pay

## 2014-04-21 ENCOUNTER — Observation Stay (HOSPITAL_COMMUNITY): Payer: Self-pay

## 2014-04-21 DIAGNOSIS — I959 Hypotension, unspecified: Secondary | ICD-10-CM | POA: Clinically undetermined

## 2014-04-21 DIAGNOSIS — R079 Chest pain, unspecified: Secondary | ICD-10-CM

## 2014-04-21 DIAGNOSIS — R001 Bradycardia, unspecified: Secondary | ICD-10-CM | POA: Clinically undetermined

## 2014-04-21 DIAGNOSIS — R002 Palpitations: Secondary | ICD-10-CM | POA: Diagnosis present

## 2014-04-21 DIAGNOSIS — I517 Cardiomegaly: Secondary | ICD-10-CM

## 2014-04-21 DIAGNOSIS — J449 Chronic obstructive pulmonary disease, unspecified: Secondary | ICD-10-CM

## 2014-04-21 LAB — COMPREHENSIVE METABOLIC PANEL
ALBUMIN: 3.3 g/dL — AB (ref 3.5–5.2)
ALK PHOS: 19 U/L — AB (ref 39–117)
ALT: 28 U/L (ref 0–53)
AST: 19 U/L (ref 0–37)
Anion gap: 11 (ref 5–15)
BILIRUBIN TOTAL: 0.4 mg/dL (ref 0.3–1.2)
BUN: 18 mg/dL (ref 6–23)
CHLORIDE: 109 meq/L (ref 96–112)
CO2: 22 mEq/L (ref 19–32)
Calcium: 8.3 mg/dL — ABNORMAL LOW (ref 8.4–10.5)
Creatinine, Ser: 0.85 mg/dL (ref 0.50–1.35)
GFR calc Af Amer: >90 mL/min (ref >90)
GFR calc non Af Amer: >90 mL/min (ref >90)
Glucose, Bld: 94 mg/dL (ref 70–99)
POTASSIUM: 4.5 meq/L (ref 3.7–5.3)
SODIUM: 142 meq/L (ref 137–147)
TOTAL PROTEIN: 6.1 g/dL (ref 6.0–8.3)

## 2014-04-21 LAB — CBC
HCT: 42.9 % (ref 39.0–52.0)
Hemoglobin: 14.6 g/dL (ref 13.0–17.0)
MCH: 32.5 pg (ref 26.0–34.0)
MCHC: 34 g/dL (ref 30.0–36.0)
MCV: 95.5 fL (ref 78.0–100.0)
PLATELETS: 150 10*3/uL (ref 150–400)
RBC: 4.49 MIL/uL (ref 4.22–5.81)
RDW: 13.6 % (ref 11.5–15.5)
WBC: 6.6 10*3/uL (ref 4.0–10.5)

## 2014-04-21 LAB — TROPONIN I: Troponin I: <0.3 ng/mL (ref <0.30)

## 2014-04-21 LAB — T4, FREE: FREE T4: 1.1 ng/dL (ref 0.80–1.80)

## 2014-04-21 LAB — TSH: TSH: 1.66 u[IU]/mL (ref 0.350–4.500)

## 2014-04-21 MED ORDER — TECHNETIUM TC 99M SESTAMIBI GENERIC - CARDIOLITE
30.0000 | Freq: Once | INTRAVENOUS | Status: AC | PRN
  Administered 2014-04-21: 30 via INTRAVENOUS

## 2014-04-21 MED ORDER — TECHNETIUM TC 99M SESTAMIBI GENERIC - CARDIOLITE
10.0000 | Freq: Once | INTRAVENOUS | Status: AC | PRN
  Administered 2014-04-21: 10 via INTRAVENOUS

## 2014-04-21 MED ORDER — REGADENOSON 0.4 MG/5ML IV SOLN
0.4000 mg | Freq: Once | INTRAVENOUS | Status: AC
  Administered 2014-04-21: 0.4 mg via INTRAVENOUS
  Filled 2014-04-21: qty 5

## 2014-04-21 MED ORDER — ONDANSETRON HCL 4 MG PO TABS: 4.0000 mg | ORAL_TABLET | Freq: Four times a day (QID) | ORAL | Status: DC | PRN

## 2014-04-21 MED ORDER — ASPIRIN 325 MG PO TABS
325.0000 mg | ORAL_TABLET | Freq: Every day | ORAL | Status: DC
  Administered 2014-04-22: 325 mg via ORAL
  Filled 2014-04-21: qty 1

## 2014-04-21 MED ORDER — ACETAMINOPHEN 325 MG PO TABS: 650.0000 mg | ORAL_TABLET | Freq: Four times a day (QID) | ORAL | Status: DC | PRN

## 2014-04-21 MED ORDER — SODIUM CHLORIDE 0.9 % IV SOLN
INTRAVENOUS | Status: DC
  Administered 2014-04-21: 09:00:00 via INTRAVENOUS

## 2014-04-21 MED ORDER — ACETAMINOPHEN 650 MG RE SUPP: 650.0000 mg | Freq: Four times a day (QID) | RECTAL | Status: DC | PRN

## 2014-04-21 MED ORDER — SODIUM CHLORIDE 0.9 % IV SOLN: INTRAVENOUS | Status: DC

## 2014-04-21 MED ORDER — ENOXAPARIN SODIUM 40 MG/0.4ML ~~LOC~~ SOLN
40.0000 mg | SUBCUTANEOUS | Status: DC
  Administered 2014-04-21: 40 mg via SUBCUTANEOUS
  Filled 2014-04-21 (×2): qty 0.4

## 2014-04-21 MED ORDER — REGADENOSON 0.4 MG/5ML IV SOLN
INTRAVENOUS | Status: AC
  Filled 2014-04-21: qty 5

## 2014-04-21 MED ORDER — ONDANSETRON HCL 4 MG/2ML IJ SOLN: 4.0000 mg | Freq: Four times a day (QID) | INTRAMUSCULAR | Status: DC | PRN

## 2014-04-21 MED ORDER — ATENOLOL 25 MG PO TABS
25.0000 mg | ORAL_TABLET | Freq: Every day | ORAL | Status: DC
  Filled 2014-04-21: qty 1

## 2014-04-21 NOTE — Consult Note (Addendum)
CARDIOLOGY CONSULT NOTE   Patient ID: Thomas Ball MRN: [id number] DOB/AGE: Jan 27, 1961 53 y.o.  Admit Date: 04/20/2014 Primary Physician: Default, Provider, MD Primary Cardiologist :   Has appointment to see Dr Dorris Carnes in the [place] [facility] post [facility] 05/01/2014.  Clinical Summary Thomas Ball is a 53 y.o.male. There is no documented prior cardiac disease. The patient has had 3 recent emergency room visits. He was seen in the emergency room on April 15, 2014 in [place]. It was noted that his heart rate was in the range of 125. The 12-lead EKG suggests a junctional tachycardia. Around that time the patient had high caffeine intake. He says that he stopped caffeine at that time.Marland Kitchen He was then seen again with some chest discomfort and palpitations in the emergency room on April 17, 2014. No arrhythmia was documented during that visit. Plans were made for outpatient followup. The emergency room team spoke with cardiology. Beta blocker was recommended and plans were made for a followup outpatient visit. Currently this is scheduled for Dr. Wyatt Haste in the retail office on May 01, 2014. Since being on the beta blocker he thinks he has not had any further rapid heartbeat. However, he has noted that his pulse has been slow when checked at home. On April 20, 2014, he had some chest discomfort. His chest discomfort is quite vague. There is no definite activity component. He also had significant fatigue. In addition he says that his wife was asking him questions. He could hear and understand the questions but may not have been responding appropriately. At this point he and his wife decided to call for an ambulance and [facility] was brought [facility] One of his EKGs reveals sinus bradycardia. He has not had a documented tachycardia arrhythmia during this visit so far. There is mild elevation of the BNP. Troponins are normal so far. Two-dimensional echo was ordered for today. He has not had a prior  two-dimensional echo.  The patient has been followed over time for small pulmonary nodules. Followup CT scans have determined that these nodules are benign.  The patient is followed by GI with GERD. There is a history of hepatitis C. It is my understanding that this may have been cured. I do not have the specifics. The problem list includes cirrhosis. I do not have any specifics on this diagnosis.  He does have COPD with a smoking history.   No Known Allergies  Medications Scheduled Medications: . [START ON 04/22/2014] aspirin  325 mg Oral Daily  . atenolol  25 mg Oral Daily  . enoxaparin (LOVENOX) injection  40 mg Subcutaneous Q24H     Infusions:     PRN Medications:  acetaminophen, acetaminophen, ondansetron (ZOFRAN) IV, ondansetron   Past Medical History  Diagnosis Date  . GERD (gastroesophageal reflux disease)   . HCV (hepatitis C virus) DR. ZACKS    AUG 2013 VIRAL LOAD UNDETECTABLE  . Skin cancer 1992    removed from side of head  . Pancreatitis, gallstone   . Tachycardia     Past Surgical History  Procedure Laterality Date  . Jaw broken    . Cholecystectomy    . Lipoma removals      SEVERAL  . Colonoscopy  10/04/2012    JJK:KXFGH sessile polyps measuring 3-8 mm/Moderate diverticulosis/Small internal hemorrhoids    Family History  Problem Relation Age of Onset  . Colon cancer Neg Hx    the patient's father had a stroke. There is no  family history of coronary artery disease.  Social History [name] reports that he has been smoking Cigarettes.  He has a 28 pack-year smoking history. He has never used smokeless tobacco. Mr. Clingerman reports that he does not drink alcohol.  Review of Systems   Patient denies fever, chills, headache, sweats, rash, change in vision, change in hearing, cough, nausea vomiting, urinary symptoms. All other systems are reviewed and are negative.  Physical Examination Blood pressure 99/58, pulse 54, temperature 97.8 F (36.6 C),  temperature source Oral, resp. rate 18, height 5\' 5"  (1.651 m), weight 259 lb 6.4 oz (117.663 kg), SpO2 95.00%. No intake or output data in the 24 hours ending 04/21/14 0733  The patient is significantly overweight. He is oriented to person time and place. Affect is normal. Head is atraumatic. There is no jugulovenous distention. Lungs are clear. Respiratory effort is nonlabored. Cardiac exam reveals S1 and S2. There no clicks or significant murmurs. The abdomen is soft. There is no peripheral edema. There are no musculoskeletal deformities. There are no skin rashes. Neurologic is grossly intact.  Prior Cardiac Testing/Procedures  Lab Results  Basic Metabolic Panel:  Recent Labs Lab 04/15/14 0750 04/20/14 2145 04/21/14 0320  NA 137 139 142  K 4.1 4.5 4.5  CL 101 104 109  CO2 23 26 22   GLUCOSE 109* 113* 94  BUN 23 16 18   CREATININE 0.92 0.80 0.85  CALCIUM 9.0 8.6 8.3*    Liver Function Tests:  Recent Labs Lab 04/15/14 0750 04/21/14 0320  AST 23 19  ALT 37 28  ALKPHOS 23* 19*  BILITOT 1.7* 0.4  PROT 7.2 6.1  ALBUMIN 4.1 3.3*    CBC:  Recent Labs Lab 04/15/14 0750 04/20/14 2145 04/21/14 0320  WBC 6.8 8.0 6.6  NEUTROABS 4.4  --   --   HGB 16.6 15.5 14.6  HCT 46.5 44.2 42.9  MCV 93.0 94.8 95.5  PLT 137* 159 150    Cardiac Enzymes:  Recent Labs Lab 04/15/14 0750 04/21/14 0320  TROPONINI <0.30 <0.30    BNP: No components found with this basename: POCBNP,    Radiology: Dg Chest 2 View  04/20/2014   CLINICAL DATA:  Chest pain.  EXAM: CHEST  2 VIEW  COMPARISON:  04/15/2014 and 11/01/2013 and chest CT dated 09/09/2013  FINDINGS: Heart size and pulmonary vascularity are normal. Lungs are clear except for calcified granuloma in the left midzone, unchanged since the prior CT scan. No effusions. No acute osseous abnormality.  IMPRESSION: No acute abnormality.   Electronically Signed   By: Rozetta Nunnery M.D.   On: 04/20/2014 23:08     ECG: I reviewed the  current EKG and multiple prior EKGs. On April 15, 2014, there is a 12-lead EKG showing a junctional tachycardia with a rate of 125. All of the other EKGs reveal sinus rhythm or sinus bradycardia. There is no significant QRS change.  Telemetry:   I have reviewed telemetry today April 21, 2014. There is sinus rhythm and mostly sinus bradycardia. There are rare PACs and PVCs.   Impression and Recommendations    Chest pain     The patient has had 3 emergency room visits over a short period of time. We will proceed with a stress nuclear study while he is in the hospital. EKGs have not shown significant QRS changes. He does not have an exertional component.    COPD (chronic obstructive pulmonary disease)    GERD (gastroesophageal reflux disease)  It is not clear if his GERD is playing a role with some of his chest symptoms. Starting medications for this will be appropriate.    Smoker     Smoking cessation is indicated.  Obesity   Weight loss will of course be appropriate.    Intermittent palpitations     There is a documented episode of junctional tachycardia with a rate of 125 in the emergency room on April 15, 2014. I cannot rule out the possibility that this is a reentrant tachycardia. This occurred before he stopped caffeine. Since stopping caffeine, he has had some tachycardia palpitations. However we have not documented a tachyarrhythmia since the single episode.. All other EKGs have shown sinus rhythm with some sinus bradycardia, since beta blocker was started.  Bradycardia    Patient has significant sinus bradycardia at this time. Beta blocker had been started with his recent emergency room visits. He is being stopped today. Clinically this medication  may have helped his tachycardia palpitations. However his heart rate is too slow at this time.  Hypotension    The patient has relative hypotension with a systolic in the range of 90 today. His blood pressure appeared to be higher  during his recent other emergency room visits. We will watch his blood pressure as he is taken off the beta blocker and his heart rate increases. He's been n.p.o. during the night. I have started normal saline at 100 cc per hour. It is possible that tachycardia and relative hypotension played a role in his sensation of fatigue and possible decreased responsiveness that occurred yesterday and brought him to the hospital.  The plan for now will be to assess left ventricular function with echo. He will also have a nuclear stress study. If he is able to walk on the treadmill, this will help [place] assess whether he is having exercise induced tachycardia arrhythmias. We will also be able to assess for ischemic heart disease. Beta blocker has been stopped. If these tests do not point [place] in any definite direction concerning further workup, he could be discharged home with plans to immediately arrange for a 21 day event monitor. He has an appointment to see Dr. Harrington Challenger in the regional office on May 01, 2014. Consideration could be given to starting low dose calcium blocker for the possibility of a reentrant tachycardia. However I am hesitant to start this today until we see normalization of his blood pressure.   Daryel November, MD 04/21/2014, 7:33 AM

## 2014-04-21 NOTE — H&P (Signed)
[facility] History and Physical  Thomas Ball YQI:[id number] DOB: 07-28-1961 DOA: 04/20/2014  Referring physician: EDP PCP: Default, Provider, MD   Chief Complaint: chest pain/discomfort  HPI: Thomas Ball is a 53 y.o. male with history of hepatitis C, h/o cholecystectomy, tobacco abuse presents to the ER with chest pain and palpitations for the last 1-2 weeks. This is his third ER visit in one week, he reported having symptoms of  palpitations where he  Has occasional dizziness and tightness/discomfort in his chest.  Last week when he was seen at [place],  his heart rate was noted to be ranging from the 60s to the 120s and 130s at times on EDP discuss with cardiology and he was started on atenolol at bedtime with plans for cardiology followup in [place], no comment on arrhthymias in the notes then. He also reported history a lot of caffeine i.e. 10 months until about which he stopped weeks ago he denies any alcohol or illicit drug use. To he came back to the ER because he noticed tingling sensation in his chest along with some tightness. He denies any productive cough, swelling in his legs, wheezing, fevers or chills. In the ER , vital signs stable. EKG unremarkable TR[facility] consulted for further management     Review of Systems: positives bolded Constitutional:  No weight loss, night sweats, Fevers, chills, fatigue.  HEENT:  No headaches, Difficulty swallowing,Tooth/dental problems,Sore throat,  No sneezing, itching, ear ache, nasal congestion, post nasal drip,  Cardio-vascular:  No chest pain, Orthopnea, PND, swelling in lower extremities, anasarca, dizziness, palpitations  GI:  No heartburn, indigestion, abdominal pain, nausea, vomiting, diarrhea, change in bowel habits, loss of appetite  Resp:   shortness of breath with exertion or at rest. No excess mucus, no productive cough, No non-productive cough, No coughing up of blood.No change in color of mucus.No  wheezing.No chest wall deformity  Skin:  no rash or lesions.  GU:  no dysuria, change in color of urine, no urgency or frequency. No flank pain.  Musculoskeletal:  No joint pain or swelling. No decreased range of motion. No back pain.  Psych:  No change in mood or affect. No depression or anxiety. No memory loss.   Past Medical History  Diagnosis Date  . GERD (gastroesophageal reflux disease)   . HCV (hepatitis C virus) DR. ZACKS    AUG 2013 VIRAL LOAD UNDETECTABLE  . Skin cancer 1992    removed from side of head  . Pancreatitis, gallstone   . Tachycardia    Past Surgical History  Procedure Laterality Date  . Jaw broken    . Cholecystectomy    . Lipoma removals      SEVERAL  . Colonoscopy  10/04/2012    LOV:FIEPP sessile polyps measuring 3-8 mm/Moderate diverticulosis/Small internal hemorrhoids   Social History:  reports that he has been smoking Cigarettes.  He has a 28 pack-year smoking history. He has never used smokeless tobacco. He reports that he does not drink alcohol or use illicit drugs.  No Known Allergies  Family History  Problem Relation Age of Onset  . Colon cancer Neg Hx      Prior to Admission medications   Medication Sig Start Date End Date Taking? Authorizing Provider  atenolol (TENORMIN) 25 MG tablet Take 1 tablet (25 mg total) by mouth daily. 04/17/14  Yes Nat Christen, MD  ibuprofen (ADVIL,MOTRIN) 800 MG tablet Take 800 mg by mouth every 8 (eight) hours as needed for moderate pain.  Yes Historical Provider, MD   Physical Exam: Filed Vitals:   04/20/14 2145 04/20/14 2300 04/20/14 2330 04/21/14 0001  BP: 121/72 98/61 82/49  103/55  Pulse: 60 60 58 59  Temp: 97.8 F (36.6 C)     TempSrc: Oral     Resp: 16   8  Height: 5\' 5"  (1.651 m)     Weight: 113.399 kg (250 lb)     SpO2: 97% 96% 94% 95%    Wt Readings from Last 3 Encounters:  04/20/14 113.399 kg (250 lb)  04/17/14 112.946 kg (249 lb)  04/15/14 112.946 kg (249 lb)    General:  Appears calm  and comfortable, obese male, no distress Eyes: PERRL, normal lids, irises & conjunctiva ENT: grossly normal , lips & tongue Neck: no LAD, masses or thyromegaly Cardiovascular: RRR, no m/r/g. No LE edema. Respiratory: CTA bilaterally, no w/r/r. Normal respiratory effort. Abdomen: soft, obese, NT, ND, BS present Skin: no rash or induration seen on limited exam Musculoskeletal: grossly normal tone BUE/BLE Psychiatric: grossly normal mood and affect, speech fluent and appropriate Neurologic: grossly non-focal.          Labs on Admission:  Basic Metabolic Panel:  Recent Labs Lab 04/15/14 0750 04/20/14 2145  NA 137 139  K 4.1 4.5  CL 101 104  CO2 23 26  GLUCOSE 109* 113*  BUN 23 16  CREATININE 0.92 0.80  CALCIUM 9.0 8.6   Liver Function Tests:  Recent Labs Lab 04/15/14 0750  AST 23  ALT 37  ALKPHOS 23*  BILITOT 1.7*  PROT 7.2  ALBUMIN 4.1   No results found for this basename: LIPASE, AMYLASE,  in the last 168 hours No results found for this basename: AMMONIA,  in the last 168 hours CBC:  Recent Labs Lab 04/15/14 0750 04/20/14 2145  WBC 6.8 8.0  NEUTROABS 4.4  --   HGB 16.6 15.5  HCT 46.5 44.2  MCV 93.0 94.8  PLT 137* 159   Cardiac Enzymes:  Recent Labs Lab 04/15/14 0750  TROPONINI <0.30    BNP (last 3 results)  Recent Labs  04/15/14 0755 04/20/14 2146  PROBNP 253.4* 314.3*   CBG: No results found for this basename: GLUCAP,  in the last 168 hours  Radiological Exams on Admission: Dg Chest 2 View  04/20/2014   CLINICAL DATA:  Chest pain.  EXAM: CHEST  2 VIEW  COMPARISON:  04/15/2014 and 11/01/2013 and chest CT dated 09/09/2013  FINDINGS: Heart size and pulmonary vascularity are normal. Lungs are clear except for calcified granuloma in the left midzone, unchanged since the prior CT scan. No effusions. No acute osseous abnormality.  IMPRESSION: No acute abnormality.   Electronically Signed   By: Rozetta Nunnery M.D.   On: 04/20/2014 23:08    EKG:  Independently reviewed. NSR, no acute ST T wave changes  Assessment/Plan Principal Problem:   Chest pain/Palpitations -symptoms concerning for arrhythmias -will admit to tele, cycle cardiac enzymes -check ECHO, TSH/T4 -please consult Cardiology in am -may need Event Monitor if workup unremarkable   COPD (chronic obstructive pulmonary disease) -stable, FU with Pulm,     Smoker -counseled  H/o Hepatitis C -treated and cured per Pt report  Code Status: Full Code DVT Prophylaxis:lovenox Family Communication: d/w spouse at bedisde Disposition Plan: home pending workup Time spent: 54min  [facility] Pager [phone]  **Disclaimer: This note may have been dictated with voice recognition software. Similar sounding words can inadvertently be transcribed and this note may contain transcription errors  which may not have been corrected upon publication of note.**

## 2014-04-21 NOTE — Progress Notes (Signed)
The patient tolerated the lexiscan myovue well.  Izzah Pasqua PA-C

## 2014-04-21 NOTE — Progress Notes (Signed)
  Echocardiogram 2D Echocardiogram has been performed.  Thomas Ball 04/21/2014, 4:18 PM

## 2014-04-21 NOTE — Progress Notes (Signed)
Pt is having periods where heart rate is sinus brady in the upper 30s to low 40s. Bp 96/64. He is asymptomatic but says he always "feels something in his chest that hasn't gone away since admission. Dr. Ron Parker notified. I d/c'd atenolol as ordered by Dr. Ron Parker and started fluid. Primary MD Dr. Lavella Lemons notified as well of this. Will closely monitor. Pt stable. Plan for stress test today

## 2014-04-21 NOTE — Progress Notes (Signed)
Utilization review completed.  

## 2014-04-21 NOTE — Progress Notes (Signed)
Notified Dr. Lavella Lemons of stress test results. Plan to keep overnight due to bradycardia. Pt made aware

## 2014-04-21 NOTE — Progress Notes (Signed)
PROGRESS NOTE    Thomas Ball GDJ:[id number] DOB: 07/22/1961 DOA: 04/20/2014 PCP: Default, Provider, MD  HPI/Brief narrative 53 year old male with history of GERD, hepatitis C-? Cured, tobacco abuse, pulmonary nodules felt to be benign, COPD seen twice in the ED prior to this admission for palpitations and chest discomfort respectively. He was newly started on beta blockers and has a followup appointment with cardiology on 05/01/2014. He used to consume excess caffeine which she has cut down and his palpitations are better. Since initiation of beta blockers, he complains of significant fatigue. He was admitted on 04/20/14 with complaints of chest discomfort. Cardiology was consulted.   Assessment/Plan:  1. Chest pain: This would make his third visit in the last 1 week with complaints of palpitations/chest pain. EKG without acute changes. Troponin x3 negative. Chest pain is almost resolved. Cardiology was consulted and plans for nuclear stress test in the hospital. Continue aspirin. Atenolol discontinued secondary to bradycardia. Followup 2-D echo. Chest x-ray-negative for acute findings. 2. Sinus bradycardia and hypotension: Secondary to recently started atenolol for palpitations/junctional tachycardia. Patient with sinus bradycardia in the low 40s with associated hypotension. Discontinued atenolol and hydrated with IV fluids. Monitor closely. TSH: 1.660 and free T4:1.1 3. COPD/tobacco abuse: Stable. Cessation counseled. 4. GERD: History as discomfort may also be due to this. Start PPI. 5. History of palpitations/junctional tachycardia, R/O reentrant tachycardia: Management as above. Cardiology input appreciated-plan ischemic workup as outlined above and advice outpatient 21 day event monitor and consideration for low dose calcium blocker    Code Status: Full Family Communication: None at bedside. Disposition Plan: Home when medically  stable   Consultants:  Cardiology.  Procedures:  None  Antibiotics:  None   Subjective: States that chest pain has almost resolved. Denies dizziness, lightheadedness or dyspnea.   Objective: Filed Vitals:   04/21/14 1428 04/21/14 1430 04/21/14 1431 04/21/14 1521  BP: 101/68 89/65 98/61  112/72  Pulse: 74 71 64 53  Temp:    97.4 F (36.3 C)  TempSrc:    Oral  Resp:    18  Height:      Weight:      SpO2:    99%   No intake or output data in the 24 hours ending 04/21/14 1523 Filed Weights   04/20/14 2145 04/21/14 0108  Weight: 113.399 kg (250 lb) 117.663 kg (259 lb 6.4 oz)     Exam:  General exam:  moderately built and obese middle-aged male lying comfortably in bed  Respiratory system: Clear. No increased work of breathing. Cardiovascular system: S1 & S2 heard, RRR. No JVD, murmurs, gallops, clicks or pedal edema. Telemetry: Sinus bradycardia in the 40s this morning. Gastrointestinal system: Abdomen is nondistended, soft and nontender. Normal bowel sounds heard. Central nervous system: Alert and oriented. No focal neurological deficits. Extremities: Symmetric 5 x 5 power.   Data Reviewed: Basic Metabolic Panel:  Recent Labs Lab 04/15/14 0750 04/20/14 2145 04/21/14 0320  NA 137 139 142  K 4.1 4.5 4.5  CL 101 104 109  CO2 23 26 22   GLUCOSE 109* 113* 94  BUN 23 16 18   CREATININE 0.92 0.80 0.85  CALCIUM 9.0 8.6 8.3*   Liver Function Tests:  Recent Labs Lab 04/15/14 0750 04/21/14 0320  AST 23 19  ALT 37 28  ALKPHOS 23* 19*  BILITOT 1.7* 0.4  PROT 7.2 6.1  ALBUMIN 4.1 3.3*   No results found for this basename: LIPASE, AMYLASE,  in the last 168 hours No results found for  this basename: AMMONIA,  in the last 168 hours CBC:  Recent Labs Lab 04/15/14 0750 04/20/14 2145 04/21/14 0320  WBC 6.8 8.0 6.6  NEUTROABS 4.4  --   --   HGB 16.6 15.5 14.6  HCT 46.5 44.2 42.9  MCV 93.0 94.8 95.5  PLT 137* 159 150   Cardiac Enzymes:  Recent  Labs Lab 04/15/14 0750 04/21/14 0320 04/21/14 0922  TROPONINI <0.30 <0.30 <0.30   BNP (last 3 results)  Recent Labs  04/15/14 0755 04/20/14 2146  PROBNP 253.4* 314.3*   CBG: No results found for this basename: GLUCAP,  in the last 168 hours  No results found for this or any previous visit (from the past 240 hour(s)).    Studies: Dg Chest 2 View  04/20/2014   CLINICAL DATA:  Chest pain.  EXAM: CHEST  2 VIEW  COMPARISON:  04/15/2014 and 11/01/2013 and chest CT dated 09/09/2013  FINDINGS: Heart size and pulmonary vascularity are normal. Lungs are clear except for calcified granuloma in the left midzone, unchanged since the prior CT scan. No effusions. No acute osseous abnormality.  IMPRESSION: No acute abnormality.   Electronically Signed   By: Rozetta Nunnery M.D.   On: 04/20/2014 23:08        Scheduled Meds: . [START ON 04/22/2014] aspirin  325 mg Oral Daily  . enoxaparin (LOVENOX) injection  40 mg Subcutaneous Q24H  . regadenoson       Continuous Infusions: . sodium chloride 100 mL/hr at 04/21/14 0845    Principal Problem:   Chest pain Active Problems:   COPD (chronic obstructive pulmonary disease)   GERD (gastroesophageal reflux disease)   Smoker   Intermittent palpitations   Bradycardia   Hypotension   Obesity    Time spent: 30 minutes.    Vernell Leep, MD, FACP, FHM. [facility] Pager [phone]  If 7PM-7AM, please contact night-coverage [email or web address] Password [email or web address] 04/21/2014, 3:23 PM    LOS: 1 day

## 2014-04-22 ENCOUNTER — Encounter (HOSPITAL_COMMUNITY): Payer: Self-pay | Admitting: Interventional Cardiology

## 2014-04-22 DIAGNOSIS — K2100 Gastro-esophageal reflux disease with esophagitis, without bleeding: Secondary | ICD-10-CM

## 2014-04-22 DIAGNOSIS — I498 Other specified cardiac arrhythmias: Secondary | ICD-10-CM

## 2014-04-22 DIAGNOSIS — I951 Orthostatic hypotension: Secondary | ICD-10-CM

## 2014-04-22 DIAGNOSIS — I471 Supraventricular tachycardia: Secondary | ICD-10-CM | POA: Diagnosis present

## 2014-04-22 DIAGNOSIS — E669 Obesity, unspecified: Secondary | ICD-10-CM

## 2014-04-22 MED ORDER — METOPROLOL TARTRATE 25 MG PO TABS: ORAL_TABLET | ORAL | Status: DC

## 2014-04-22 NOTE — Progress Notes (Addendum)
SUBJECTIVE:  Occasional palpitations.  No chest pain  OBJECTIVE:   Vitals:   Filed Vitals:   04/21/14 1431 04/21/14 1521 04/21/14 2034 04/22/14 0637  BP: 98/61 112/72 101/68 108/83  Pulse: 64 53 66 58  Temp:  97.4 F (36.3 C) 98.2 F (36.8 C) 97.3 F (36.3 C)  TempSrc:  Oral Oral Oral  Resp:  18 18   Height:      Weight:    257 lb 4.8 oz (116.711 kg)  SpO2:  99% 98% 98%   I&O's:  No intake or output data in the 24 hours ending 04/22/14 0958 TELEMETRY: Reviewed telemetry pt in NSR; occasional narrow complex tachycardia with rate of 113     PHYSICAL EXAM General: Well developed, well nourished, in no acute distress Head:   Normal cephalic and atramatic  Lungs:   Clear bilaterally to auscultation. Heart:   HRRR S1 S2  No JVD.   Abdomen: abdomen soft and non-tender Msk:  Back normal,  Normal strength and tone for age. Extremities:   No edema.   Neuro: Alert and oriented. Psych:  Normal affect, responds appropriately   LABS: Basic Metabolic Panel:  Recent Labs  04/20/14 2145 04/21/14 0320  NA 139 142  K 4.5 4.5  CL 104 109  CO2 26 22  GLUCOSE 113* 94  BUN 16 18  CREATININE 0.80 0.85  CALCIUM 8.6 8.3*   Liver Function Tests:  Recent Labs  04/21/14 0320  AST 19  ALT 28  ALKPHOS 19*  BILITOT 0.4  PROT 6.1  ALBUMIN 3.3*   No results found for this basename: LIPASE, AMYLASE,  in the last 72 hours CBC:  Recent Labs  04/20/14 2145 04/21/14 0320  WBC 8.0 6.6  HGB 15.5 14.6  HCT 44.2 42.9  MCV 94.8 95.5  PLT 159 150   Cardiac Enzymes:  Recent Labs  04/21/14 0320 04/21/14 0922  TROPONINI <0.30 <0.30   BNP: No components found with this basename: POCBNP,  D-Dimer: No results found for this basename: DDIMER,  in the last 72 hours Hemoglobin A1C: No results found for this basename: HGBA1C,  in the last 72 hours Fasting Lipid Panel: No results found for this basename: CHOL, HDL, LDLCALC, TRIG, CHOLHDL, LDLDIRECT,  in the last 72  hours Thyroid Function Tests:  Recent Labs  04/21/14 0320  TSH 1.660   Anemia Panel: No results found for this basename: VITAMINB12, FOLATE, FERRITIN, TIBC, IRON, RETICCTPCT,  in the last 72 hours Coag Panel:   Lab Results  Component Value Date   INR 1.04 04/20/2014   INR 1.03 02/23/2011   INR 1.04 12/12/2010    RADIOLOGY: Dg Chest 2 View  04/20/2014   CLINICAL DATA:  Chest pain.  EXAM: CHEST  2 VIEW  COMPARISON:  04/15/2014 and 11/01/2013 and chest CT dated 09/09/2013  FINDINGS: Heart size and pulmonary vascularity are normal. Lungs are clear except for calcified granuloma in the left midzone, unchanged since the prior CT scan. No effusions. No acute osseous abnormality.  IMPRESSION: No acute abnormality.   Electronically Signed   By: Rozetta Nunnery M.D.   On: 04/20/2014 23:08   Nm Myocar Multi W/spect W/wall Motion / Ef  04/21/2014   HISTORY OF PRESENT ILLNESS: Nuclear Med Background  Indication for Stress Test:  Chest pain  History: 53 y.o. male with history of hepatitis C, h/o cholecystectomy, tobacco abuse presents to the ER with chest pain and palpitations for the last 1-2 weeks.  This is his third  ER visit in one week, he reported having symptoms of palpitations where he Has occasional dizziness and tightness/discomfort in his chest.  Last week when he was seen at [place], his heart rate was noted to be ranging from the 60s to the 120s and 130s at times on EDP discuss with cardiology and he was started on atenolol at bedtime with plans for cardiology followup in [place], no comment on arrhthymias in the notes then.  He also reported history a lot of caffeine i.e. 10 months until about which he stopped weeks ago he denies any alcohol or illicit drug use.  To he came back to the ER because he noticed tingling sensation in his chest along with some tightness.  He denies any productive cough, swelling in his legs, wheezing, fevers or chills.  In the ER , vital signs stable. EKG  unremarkable TRH consulted for further management  Cardiac Risk Factors:  Tobacco abuse, hepatitis C  Symptoms:  Chest pain  PROCEDURE: Nuclear Pre-Procedure  Caffeine/Decaff Intake:  NPO After: MN.  Lungs:  O2 Stat:  IV 0.9% NS with Angio Cath:  Chest Size (in):  Cup Size:  Height: 5'5"  Weight:  259 lb  BMI:  Tech Comments:  Nuclear Med Study  1 or 2 day study: 1  Stress Test Type: N/A  Reading MD: Hilty  Order Authorizing Provider: Ron Parker  Resting Radionuclide: Sestamibi  Resting Radionuclide Dose:  10 mCi  Stress Radionuclide: Sestamibi  Stress Radionuclide Dose:  30 mCi  Stress Protocol  Rest HR: 55  Stress HR: 64  Rest BP: 101/68  Stress BP: 98/64  Exercise Time (min):  METS:  Dose of Adenosine (mg):  Dose of Lexiscan:  0.4 mg  Dose of Atropine (mg):  Dose of Dobutamine:  Stress Test Technologist:  Nuclear Technologist:  Rest Procedure:  Stress Procedure:  Transient Ischemic Dilatation (Normal <1.22): 1.00  Lung/Heart Ratio (Normal <0.45): 0.32  QGS EDV:  116 ml  QGS ESV:  54 ml  LV Ejection Fraction: 53%  Rest ECG: NSR  Stress ECG: NSR  Raw Data Images:  Increased bowel and hepatic uptake of tracer  Stress Images:  Small, dense perfusion defect in the inferior apex  Rest Images:  Small, dense perfusion defect in the inferior apex  Subtraction (SDS): 1  IMPRESSION: Exercise Capacity: N/A  BP Response: Hypotensive  Clinical Symptoms: none  ECG Impression: N/A  Comparison with Prior Nuclear Study: None  Final Impression:  Low risk nuclear stress test. Small, fixed inferoapical defect, which is likely artifact, but could represent a prior scar. No reversible ischemia. LVEF 53%, normal wall motion.   Electronically Signed   By: Pixie Casino   On: 04/21/2014 16:07   Dg Chest Portable 1 View  04/15/2014   CLINICAL DATA:  Chest pain.  EXAM: PORTABLE CHEST - 1 VIEW  COMPARISON:  11/01/2013  FINDINGS: Cardiac silhouette is mildly enlarged. Normal mediastinal and hilar contours. There are mild thickened interstitial  markings bilaterally, stable. Stable left upper lobe nodule. No lung consolidation or edema. No pleural effusion or pneumothorax.  Bony thorax is grossly intact.  IMPRESSION: No acute cardiopulmonary disease.   Electronically Signed   By: Lajean Manes M.D.   On: 04/15/2014 08:22      ASSESSMENT: Kathyrn Lass:  Atypical chest pain : no ischemia by nuclear study.  SVT- intermittent narrow complex tachycardia at 113 bpm. (strip saved to Epic)  Bradycardia with atenolol. Minimize caffeine.  He was drinking 10+ mountain Dews  daily until a few weeks ago. Could try metoprolol 12.5 mg PO BID if sx recur.  Otherwise, calcium channel blocker would be a reasonable choice.   Follow up with cardiology in [place]- Dr. Harrington Challenger.  Jettie Booze., MD  04/22/2014  9:58 AM

## 2014-04-22 NOTE — Discharge Summary (Signed)
Physician Discharge Summary  Patient ID: Thomas Ball MRN: [id number] DOB/AGE: [birth year] 53 y.o.  Admit date: 04/20/2014 Discharge date: 04/22/2014  Primary Care Physician:  Default, Provider, MD  Discharge Diagnoses:    . Chest pain . COPD (chronic obstructive pulmonary disease) . Smoker . Intermittent palpitations . GERD (gastroesophageal reflux disease) . SVT (supraventricular tachycardia)  Consults: Cardiology, Dr. Ron Parker   Recommendations for Outpatient Follow-up:  Patient has a followup appointment with Dr. Harrington Challenger in August, the patient was recommended 21 days event monitor  Allergies:  No Known Allergies   Discharge Medications:   Medication List    STOP taking these medications       atenolol 25 MG tablet  Commonly known as:  TENORMIN      TAKE these medications       ibuprofen 800 MG tablet  Commonly known as:  ADVIL,MOTRIN  Take 800 mg by mouth every 8 (eight) hours as needed for moderate pain.     metoprolol tartrate 25 MG tablet  Commonly known as:  LOPRESSOR  Take metoprolol 12.5 mg (split 25mg  tab to half) oral BID (twice a day) AS NEEDED FOR PALPITATIONS.         Brief H and P: For complete details please refer to admission H and P, but in [place] is a 53 y.o. male with history of hepatitis C, h/o cholecystectomy, tobacco abuse presents to the ER with chest pain and palpitations for the last 1-2 weeks. This is his third ER visit in one week, he reported having symptoms of palpitations where he Has occasional dizziness and tightness/discomfort in his chest.  Last week when he was seen at [place], his heart rate was noted to be ranging from the 60s to the 120s and 130s at times on EDP discuss with cardiology and he was started on atenolol at bedtime with plans for cardiology followup in [place], no comment on arrhthymias in the notes then.  He also reported history a lot of caffeine i.e. 10 months until about which he stopped  weeks ago he denied any alcohol or illicit drug use.  On the day of admission, he presented with tingling sensation in his chest along with some tightness.  He denied any productive cough, swelling in his legs, wheezing, fevers or chills.   Hospital Course:  53 year old male with history of GERD, hepatitis C-? Cured, tobacco abuse, pulmonary nodules felt to be benign, COPD seen twice in the ED prior to this admission for palpitations and chest discomfort respectively. He was newly started on beta blockers and has a followup appointment with cardiology on 05/01/2014. He used to consume excess caffeine which he has cut down and his palpitations are better. Since initiation of beta blockers, he complains of significant fatigue. He was admitted on 04/20/14 with complaints of chest discomfort. Cardiology was consulted.  Chest pain -Patient was admitted to telemetry and ruled out for acute ACS. Troponins x3 were negative. Chest pain has completely resolved. EKG showed no acute ST-T wave changes suggestive of ischemia. Cardiology was consulted.   Patient underwent nuclear medicine stress test which showed low risk, small fixed inferior apical defect likely artifact, no reversible ischemia, LVF 53% normal wall motion.  Atenolol was discontinued due to bradycardia. Chest x-ray showed no acute findings  2-D echo showed EF of 60%, normal wall motion  Sinus bradycardia, hypotension - Likely due to recently started atenolol. Patient was placed on gentle hydration and the atenolol was discontinued. Thyroid panel was  checked and TSH is 1.66 and free T4-1.1 Cardiology recommended metoprolol 12.5 mg twice a day as needed for palpitations, outpatient event monitor. He also has an appointment with Dr. Harrington Challenger next week at [facility] office.  GERD: Patient was placed on PPI.  Nicotine abuse : Patient was strongly counseled on tobacco cessation      Day of Discharge BP 108/83  Pulse 58  Temp(Src) 97.3 F (36.3  C) (Oral)  Resp 18  Ht 5\' 5"  (1.651 m)  Wt 116.711 kg (257 lb 4.8 oz)  BMI 42.82 kg/m2  SpO2 98%  Physical Exam: General: Alert and awake oriented x3 not in any acute distress. HEENT: anicteric sclera, pupils reactive to light and accommodation CVS: S1-S2 clear no murmur rubs or gallops Chest: clear to auscultation bilaterally, no wheezing rales or rhonchi Abdomen: soft nontender, nondistended, normal bowel sounds Extremities: no cyanosis, clubbing or edema noted bilaterally Neuro: Cranial nerves II-XII intact, no focal neurological deficits   The results of significant diagnostics from this hospitalization (including imaging, microbiology, ancillary and laboratory) are listed below for reference.    LAB RESULTS: Basic Metabolic Panel:  Recent Labs Lab 04/20/14 2145 04/21/14 0320  NA 139 142  K 4.5 4.5  CL 104 109  CO2 26 22  GLUCOSE 113* 94  BUN 16 18  CREATININE 0.80 0.85  CALCIUM 8.6 8.3*   Liver Function Tests:  Recent Labs Lab 04/21/14 0320  AST 19  ALT 28  ALKPHOS 19*  BILITOT 0.4  PROT 6.1  ALBUMIN 3.3*   No results found for this basename: LIPASE, AMYLASE,  in the last 168 hours No results found for this basename: AMMONIA,  in the last 168 hours CBC:  Recent Labs Lab 04/20/14 2145 04/21/14 0320  WBC 8.0 6.6  HGB 15.5 14.6  HCT 44.2 42.9  MCV 94.8 95.5  PLT 159 150   Cardiac Enzymes:  Recent Labs Lab 04/21/14 0320 04/21/14 0922  TROPONINI <0.30 <0.30   BNP: No components found with this basename: POCBNP,  CBG: No results found for this basename: GLUCAP,  in the last 168 hours  Significant Diagnostic Studies:  Dg Chest 2 View  04/20/2014   CLINICAL DATA:  Chest pain.  EXAM: CHEST  2 VIEW  COMPARISON:  04/15/2014 and 11/01/2013 and chest CT dated 09/09/2013  FINDINGS: Heart size and pulmonary vascularity are normal. Lungs are clear except for calcified granuloma in the left midzone, unchanged since the prior CT scan. No effusions. No  acute osseous abnormality.  IMPRESSION: No acute abnormality.   Electronically Signed   By: Rozetta Nunnery M.D.   On: 04/20/2014 23:08   Nm Myocar Multi W/spect W/wall Motion / Ef  04/21/2014   HISTORY OF PRESENT ILLNESS: Nuclear Med Background  Indication for Stress Test:  Chest pain  History: 53 y.o. male with history of hepatitis C, h/o cholecystectomy, tobacco abuse presents to the ER with chest pain and palpitations for the last 1-2 weeks.  This is his third ER visit in one week, he reported having symptoms of palpitations where he Has occasional dizziness and tightness/discomfort in his chest.  Last week when he was seen at [place], his heart rate was noted to be ranging from the 60s to the 120s and 130s at times on EDP discuss with cardiology and he was started on atenolol at bedtime with plans for cardiology followup in [place], no comment on arrhthymias in the notes then.  He also reported history a lot of caffeine i.e.  10 months until about which he stopped weeks ago he denies any alcohol or illicit drug use.  To he came back to the ER because he noticed tingling sensation in his chest along with some tightness.  He denies any productive cough, swelling in his legs, wheezing, fevers or chills.  In the ER , vital signs stable. EKG unremarkable TRH consulted for further management  Cardiac Risk Factors:  Tobacco abuse, hepatitis C  Symptoms:  Chest pain  PROCEDURE: Nuclear Pre-Procedure  Caffeine/Decaff Intake:  NPO After: MN.  Lungs:  O2 Stat:  IV 0.9% NS with Angio Cath:  Chest Size (in):  Cup Size:  Height: 5'5"  Weight:  259 lb  BMI:  Tech Comments:  Nuclear Med Study  1 or 2 day study: 1  Stress Test Type: N/A  Reading MD: Hilty  Order Authorizing Provider: Ron Parker  Resting Radionuclide: Sestamibi  Resting Radionuclide Dose:  10 mCi  Stress Radionuclide: Sestamibi  Stress Radionuclide Dose:  30 mCi  Stress Protocol  Rest HR: 55  Stress HR: 64  Rest BP: 101/68  Stress BP: 98/64  Exercise Time  (min):  METS:  Dose of Adenosine (mg):  Dose of Lexiscan:  0.4 mg  Dose of Atropine (mg):  Dose of Dobutamine:  Stress Test Technologist:  Nuclear Technologist:  Rest Procedure:  Stress Procedure:  Transient Ischemic Dilatation (Normal <1.22): 1.00  Lung/Heart Ratio (Normal <0.45): 0.32  QGS EDV:  116 ml  QGS ESV:  54 ml  LV Ejection Fraction: 53%  Rest ECG: NSR  Stress ECG: NSR  Raw Data Images:  Increased bowel and hepatic uptake of tracer  Stress Images:  Small, dense perfusion defect in the inferior apex  Rest Images:  Small, dense perfusion defect in the inferior apex  Subtraction (SDS): 1  IMPRESSION: Exercise Capacity: N/A  BP Response: Hypotensive  Clinical Symptoms: none  ECG Impression: N/A  Comparison with Prior Nuclear Study: None  Final Impression:  Low risk nuclear stress test. Small, fixed inferoapical defect, which is likely artifact, but could represent a prior scar. No reversible ischemia. LVEF 53%, normal wall motion.   Electronically Signed   By: Pixie Casino   On: 04/21/2014 16:07    2D ECHO: Study Conclusions  - Left ventricle: The cavity size was normal. Wall thickness was increased in a pattern of mild LVH. The estimated ejection fraction was 60%. Wall motion was normal; there were no regional wall motion abnormalities. - Right ventricle: The cavity size was normal. Systolic function was normal.     Disposition and Follow-up:     Discharge Instructions   Diet - low sodium heart healthy    Complete by:  As directed      Discharge instructions    Complete by:  As directed   Please avoid all caffeinated drinks, sodas and coffee. Follow up with Dr. Harrington Challenger per your appointment.     Increase activity slowly    Complete by:  As directed             DISPOSITION:Home  DIET:Heart healthy     DISCHARGE FOLLOW-UP Follow-up Information   Follow up with Dorris Carnes, MD  On 05/01/2014. (AT 8:40 AM , for hospital follow-up)    Contact information:   CARDIOLOGY  [address] [place]      Time spent on Discharge: 35 minutes  Signed:   RAI,RIPUDEEP M.D. [facility] 04/22/2014, 10:30 AM Pager: [phone]   **Disclaimer: This note was dictated with voice  recognition software. Similar sounding words can inadvertently be transcribed and this note may contain transcription errors which may not have been corrected upon publication of note.**

## 2014-05-01 ENCOUNTER — Ambulatory Visit (INDEPENDENT_AMBULATORY_CARE_PROVIDER_SITE_OTHER): Payer: Self-pay | Admitting: Internal Medicine

## 2014-05-01 ENCOUNTER — Encounter: Payer: Self-pay | Admitting: Internal Medicine

## 2014-05-01 VITALS — BP 106/78 | HR 70 | Ht 65.0 in | Wt 258.0 lb

## 2014-05-01 DIAGNOSIS — I471 Supraventricular tachycardia: Secondary | ICD-10-CM

## 2014-05-01 NOTE — Progress Notes (Signed)
HPI Patient is a 53 yo with episodes of tachycardia.  He was seen at [facility] ER on 7/22.  HR 125  EKG with ? Junctional tachycardia.  Noted high caffeine intake  Stopped. Seen again on 7/24.  No arrhythmia at that time.  Outpt f/u made Given Rx for b blocker.No more spells of tachycardia but resting HR slow. On April 20, 2014, he had some chest discomfort. His chest discomfort is quite vague. There is no definite activity component. He also had significant fatigue. In addition he says that his wife was asking him questions. He could hear and understand the questions but may not have been responding appropriately. In ER had SB   After admit had occasional SVT at 113 bpm   Echo with normal LV function.   SInce d/c he has felt fatigued Did not take b blocker  Patient chasing dog Tues 8/4  Heart started racing  No syncope Heart slowed after he stopped actvity  Tired after   Wed  Still tired and weak Thurs more energy Patient says he is having spells of heart racing every day.  Occur when he gets active.   Short lived  He has cut bck on activity because of them.   No passing out  Occasional dizziness   No Known Allergies  Current Outpatient Prescriptions  Medication Sig Dispense Refill  . ibuprofen (ADVIL,MOTRIN) 800 MG tablet Take 800 mg by mouth every 8 (eight) hours as needed for moderate pain.       No current facility-administered medications for this visit.    Past Medical History  Diagnosis Date  . GERD (gastroesophageal reflux disease)   . HCV (hepatitis C virus) DR. ZACKS    AUG 2013 VIRAL LOAD UNDETECTABLE  . Skin cancer 1992    removed from side of head  . Pancreatitis, gallstone   . Tachycardia   . SVT (supraventricular tachycardia)     Past Surgical History  Procedure Laterality Date  . Jaw broken    . Cholecystectomy    . Lipoma removals      SEVERAL  . Colonoscopy  10/04/2012    YWV:PXTGG sessile polyps measuring 3-8 mm/Moderate diverticulosis/Small internal hemorrhoids     Family History  Problem Relation Age of Onset  . Colon cancer Neg Hx     History   Social History  . Marital Status: Married    Spouse Name: N/A    Number of Children: N/A  . Years of Education: N/A   Occupational History  . concrete work    Social History Main Topics  . 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 Smokeless tobacco: Never Used     Comment: had quit for 9 mos, picked up again 2 mos ago  . Alcohol Use: No  . Drug Use: No     Comment: in the 1980s, smoke crack. None since Dec 2011.   Marland Kitchen Sexual Activity: Not on file   Other Topics Concern  . Not on file   Social History Narrative  . No narrative on file    Review of Systems:  All systems reviewed.  They are negative to the above problem except as previously stated.  Vital Signs: BP 106/78  Pulse 70  Ht 5\' 5"  (1.651 m)  Wt 258 lb (117.028 kg)  BMI 42.93 kg/m2  Physical Exam Patient is a morbidly obese 53 yo in NAD   HEENT:  Normocephalic, atraumatic. EOMI,  PERRLA.  Neck: JVP is normal.  No bruits.  Lungs: clear to auscultation. No rales no wheezes.  Heart: Regular rate and rhythm. Normal S1, S2. No S3.   No significant murmurs. PMI not displaced.  Abdomen:  Distended  , nontender. Normal bowel sounds. No masses. No hepatomegaly.  Extremities:   Good distal pulses throughout. No lower extremity edema.  Musculoskeletal :moving all extremities.  Neuro:   alert and oriented x3.  CN II-XII grossly intact.  EKG  Tele:  SVT  / SR  Assessment and Plan:  1.  Tachycardia  Patient having more frequent spells of feeling his heart race despite stopping caffeine. It is not clear if all is SVT but he has had documented tach.  When on b blocker he is bradycardic I will review with EP  Consider appt to discuss further options.   Avoid stimulants  Watch for dizziness Has not fainted yet

## 2014-05-01 NOTE — Patient Instructions (Signed)
Your physician recommends that you schedule a follow-up appointment in: 2 weeks with Dr Lovena Le.  Your physician recommends that you continue on your current medications as directed. Please refer to the Current Medication list given to you today.

## 2014-05-06 ENCOUNTER — Telehealth: Payer: Self-pay | Admitting: *Deleted

## 2014-05-06 NOTE — Telephone Encounter (Signed)
Pt states that he was told depending on his last office visit and results that he may need to follow up with a EP doctor. He has not been called back yet to know weather or not to make appointment

## 2014-05-07 NOTE — Telephone Encounter (Signed)
Appointment made with Dr. Lovena Le

## 2014-05-14 ENCOUNTER — Telehealth: Payer: Self-pay

## 2014-05-14 DIAGNOSIS — I471 Supraventricular tachycardia: Secondary | ICD-10-CM

## 2014-05-14 NOTE — Telephone Encounter (Signed)
Per Dr.Ross,schedule 48 hr holter monitor ,pt already has fu apt with Dr.Taylor scheduled  Holter scheduled Monday 8/31 at 8 am    Pt is working OOT on several jobs and it is very difficult to make apt.He was under the impression he could wear the monitor for 48 hrs and return a week later.I explained that the monitors must be returned as other pt's are scheduled. Patient voiced undrstanding

## 2014-05-18 ENCOUNTER — Encounter (HOSPITAL_COMMUNITY): Payer: Self-pay

## 2014-05-25 ENCOUNTER — Ambulatory Visit (HOSPITAL_COMMUNITY)
Admission: RE | Admit: 2014-05-25 | Discharge: 2014-05-25 | Disposition: A | Discharge status: Home / Self Care | Payer: MEDICAID | Source: Ambulatory Visit | Attending: Internal Medicine | Admitting: Internal Medicine

## 2014-05-25 DIAGNOSIS — I471 Supraventricular tachycardia: Secondary | ICD-10-CM

## 2014-05-25 DIAGNOSIS — I498 Other specified cardiac arrhythmias: Secondary | ICD-10-CM | POA: Insufficient documentation

## 2014-05-25 NOTE — Progress Notes (Signed)
48 hour Holter Monitor in progress. 

## 2014-05-27 ENCOUNTER — Ambulatory Visit (INDEPENDENT_AMBULATORY_CARE_PROVIDER_SITE_OTHER): Payer: Self-pay | Admitting: Gastroenterology

## 2014-05-27 ENCOUNTER — Encounter: Payer: Self-pay | Admitting: Gastroenterology

## 2014-05-27 VITALS — BP 125/84 | HR 78 | Temp 97.5°F | Ht 65.0 in | Wt 257.6 lb

## 2014-05-27 DIAGNOSIS — K7460 Unspecified cirrhosis of liver: Secondary | ICD-10-CM

## 2014-05-27 DIAGNOSIS — K7469 Other cirrhosis of liver: Secondary | ICD-10-CM

## 2014-05-27 NOTE — Progress Notes (Signed)
Cc to pcp °

## 2014-05-27 NOTE — Assessment & Plan Note (Signed)
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  AFP/U/S SEP 2015 NEEDS EGD BEFORE END OF 2015 CONTINUE YOUR WEIGHT LOSS EFFORTS. DISCUSSED PROCEDURE, BENEFITS, RISKS, AND MANAGEMENT OF CIRRHOSIS/FATTY LIVER DISEASE. FOLLOW UP IN 6 MOS. MERRY CHRISTMAS AND HAPPY NEW YEAR!

## 2014-05-27 NOTE — Progress Notes (Signed)
PATIENT NIC'D  °

## 2014-05-27 NOTE — Progress Notes (Signed)
   Subjective:    Patient ID: Thomas Ball, male    DOB: 09-Nov-1960, 53 y.o.   MRN: [id number]  Default, Provider, MD  HPI Questions about cirrhosis and what he can do soit doesn't get worse. RARE HEARTBURN. UNDERGOING CARDIOLOGY EVAL.  PT DENIES FEVER, CHILLS, HEMATOCHEZIA, nausea, vomiting, melena, diarrhea,  CHANGE IN BOWEL IN HABITS, constipation, or abdominal pain, problems swallowing.  Past Medical History  Diagnosis Date  . GERD (gastroesophageal reflux disease)   . HCV (hepatitis C virus) DR. ZACKS    AUG 2013 VIRAL LOAD UNDETECTABLE  . Skin cancer 1992    removed from side of head  . Pancreatitis, gallstone   . Tachycardia   . SVT (supraventricular tachycardia)    Past Surgical History  Procedure Laterality Date  . Jaw broken    . Cholecystectomy    . Lipoma removals      SEVERAL  . Colonoscopy  10/04/2012    JHE:RDEYC sessile polyps measuring 3-8 mm/Moderate diverticulosis/Small internal hemorrhoids    No Known Allergies  Current Outpatient Prescriptions  Medication Sig Dispense Refill  . ibuprofen (ADVIL,MOTRIN) 800 MG tablet Take 800 mg by mouth every 8 (eight) hours as needed for moderate pain.          Review of Systems     Objective:   Physical Exam  Vitals reviewed. Constitutional: He is oriented to person, place, and time. He appears well-developed and well-nourished. No distress.  HENT:  Head: Normocephalic and atraumatic.  Mouth/Throat: Oropharynx is clear and moist. No oropharyngeal exudate.  Eyes: Pupils are equal, round, and reactive to light. No scleral icterus.  Neck: Normal range of motion. Neck supple.  Cardiovascular: Normal rate, regular rhythm and normal heart sounds.   Pulmonary/Chest: Effort normal and breath sounds normal. No respiratory distress.  Abdominal: Soft. Bowel sounds are normal. He exhibits no distension. There is no tenderness.  Musculoskeletal: He exhibits no edema.  Lymphadenopathy:    He has no cervical  adenopathy.  Neurological: He is alert and oriented to person, place, and time.  NO FOCAL DEFICITS   Psychiatric: He has a normal mood and affect.          Assessment & Plan:

## 2014-05-27 NOTE — Patient Instructions (Signed)
KEEP UP THE GOOD WORK. DRINK LESS MT DEW. EAT SMALLER PORTIONS. CONTINUE YOUR WEIGHT LOSS EFFORTS.  LABS AND ULTRASOUND IN SEP 2015.  YOU NEED AN UPPER ENDOSCOPY BEFORE THE YEAR ENDS.  FOLLOW UP IN 6 MOS. MERRY CHRISTMAS AND HAPPY NEW YEAR!

## 2014-05-28 ENCOUNTER — Encounter: Payer: Self-pay | Admitting: Gastroenterology

## 2014-05-28 ENCOUNTER — Other Ambulatory Visit: Payer: Self-pay | Admitting: Gastroenterology

## 2014-06-04 ENCOUNTER — Encounter: Payer: Self-pay | Admitting: Internal Medicine

## 2014-06-04 ENCOUNTER — Encounter: Payer: Self-pay | Admitting: *Deleted

## 2014-06-04 ENCOUNTER — Ambulatory Visit (INDEPENDENT_AMBULATORY_CARE_PROVIDER_SITE_OTHER): Payer: Self-pay | Admitting: Internal Medicine

## 2014-06-04 VITALS — BP 126/80 | HR 125 | Ht 65.0 in | Wt 255.6 lb

## 2014-06-04 DIAGNOSIS — I471 Supraventricular tachycardia: Secondary | ICD-10-CM

## 2014-06-04 DIAGNOSIS — R001 Bradycardia, unspecified: Secondary | ICD-10-CM

## 2014-06-04 DIAGNOSIS — I498 Other specified cardiac arrhythmias: Secondary | ICD-10-CM

## 2014-06-04 NOTE — Assessment & Plan Note (Signed)
The patient has evidence of sinus node dysfunction. It is currently not yet severe enough to recommend PPM insertion though it may in the future.

## 2014-06-04 NOTE — Patient Instructions (Signed)
Your physician has recommended that you have an ablation. Catheter ablation is a medical procedure used to treat some cardiac arrhythmias (irregular heartbeats). During catheter ablation, a long, thin, flexible tube is put into a blood vessel in your groin (upper thigh), or neck. This tube is called an ablation catheter. It is then guided to your heart through the blood vessel. Radio frequency waves destroy small areas of heart tissue where abnormal heartbeats may cause an arrhythmia to start. Please see the instruction sheet given to you today.   See instruction sheet for procedure 

## 2014-06-04 NOTE — Progress Notes (Signed)
      HPI Thomas Ball is referred today by Dr. Harrington Challenger for evaluation of SVT. He is a pleasant 53 yo man with a h/o SVT dating back 30 years. The patient notes that his episodes start and stop suddenly, are associated with sob and that he has never had syncope. He does have documented bradycardia which was made worse by his beta blocker therapy. He could not tolerate beta blockers. His episodes start and stop suddenly. Over the past 6 months, his symptoms occur daily and last hours at a time. He has documented short RP tachycardia at rates of 120/min. No Known Allergies   No current outpatient prescriptions on file.   No current facility-administered medications for this visit.     Past Medical History  Diagnosis Date  . GERD (gastroesophageal reflux disease)   . HCV (hepatitis C virus) DR. ZACKS    AUG 2013 VIRAL LOAD UNDETECTABLE  . Skin cancer 1992    removed from side of head  . Pancreatitis, gallstone   . Tachycardia   . SVT (supraventricular tachycardia)     ROS:   All systems reviewed and negative except as noted in the HPI.   Past Surgical History  Procedure Laterality Date  . Jaw broken    . Cholecystectomy    . Lipoma removals      SEVERAL  . Colonoscopy  10/04/2012    TWS:FKCLE sessile polyps measuring 3-8 mm/Moderate diverticulosis/Small internal hemorrhoids     Family History  Problem Relation Age of Onset  . Colon cancer Neg Hx      History   Social History  . Marital Status: Married    Spouse Name: N/A    Number of Children: N/A  . Years of Education: N/A   Occupational History  . concrete work    Social History Main Topics  . 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 Smokeless tobacco: Never Used     Comment: had quit for 9 mos, picked up again 2 mos ago  . Alcohol Use: No  . Drug Use: No     Comment: in the 1980s, smoke crack. None since Dec 2011.   Marland Kitchen Sexual Activity: Not on file    Other Topics Concern  . Not on file   Social History Narrative  . No narrative on file     BP 126/80  Pulse 125  Ht 5\' 5"  (1.651 m)  Wt 255 lb 9.6 oz (115.939 kg)  BMI 42.53 kg/m2  Physical Exam:  Well appearing 53 yo man, NAD HEENT: Unremarkable except for full beard Neck:  No JVD, no thyromegally Back:  No CVA tenderness Lungs:  Clear with no wheezes HEART:  Regular brady rhythm, no murmurs, no rubs, no clicks Abd:  soft, obese, positive bowel sounds, no organomegally, no rebound, no guarding Ext:  2 plus pulses, no edema, no cyanosis, no clubbing Skin:  No rashes no nodules Neuro:  CN II through XII intact, motor grossly intact  EKG - sinus bradycardia transitioning into SVT.   Assess/Plan:

## 2014-06-04 NOTE — Assessment & Plan Note (Signed)
His symptoms are not well controlled. I have recommended proceeding with catheter ablation. The risks/benefits/goals/expectations of the procedure have been discussed with the patient and his wishes to proceed.

## 2014-06-05 ENCOUNTER — Ambulatory Visit (HOSPITAL_COMMUNITY): Admission: RE | Admit: 2014-06-05 | Payer: Self-pay | Source: Ambulatory Visit

## 2014-06-15 ENCOUNTER — Other Ambulatory Visit: Payer: Self-pay

## 2014-06-16 ENCOUNTER — Telehealth: Payer: Self-pay | Admitting: Internal Medicine

## 2014-06-16 NOTE — Telephone Encounter (Signed)
Spoke with patient's wife and we will keep procedure as scheduled.  He will have to get his labs done at the hospital the morning of as he is working out of town prior to procedure.  She verbalized understanding and was glad this could be done as they want to get this behind them.

## 2014-06-16 NOTE — Telephone Encounter (Signed)
New problem   Per pt's wife pt want to cancel sx w/Dr Lovena Le on 06/22/14. Please call pt's wife

## 2014-06-18 ENCOUNTER — Encounter (HOSPITAL_COMMUNITY): Payer: Self-pay | Admitting: Pharmacy Technician

## 2014-06-18 ENCOUNTER — Other Ambulatory Visit: Payer: Self-pay

## 2014-06-22 ENCOUNTER — Encounter (HOSPITAL_COMMUNITY): Payer: Self-pay | Admitting: *Deleted

## 2014-06-22 ENCOUNTER — Encounter (HOSPITAL_COMMUNITY)
Admission: RE | Disposition: A | Discharge status: Home / Self Care | Payer: Self-pay | Source: Ambulatory Visit | Attending: Internal Medicine

## 2014-06-22 ENCOUNTER — Ambulatory Visit (HOSPITAL_COMMUNITY)
Admission: RE | Admit: 2014-06-22 | Discharge: 2014-06-23 | Disposition: A | Discharge status: Home / Self Care | Payer: Medicaid Other | Source: Ambulatory Visit | Attending: Internal Medicine | Admitting: Internal Medicine

## 2014-06-22 DIAGNOSIS — K219 Gastro-esophageal reflux disease without esophagitis: Secondary | ICD-10-CM | POA: Insufficient documentation

## 2014-06-22 DIAGNOSIS — F17200 Nicotine dependence, unspecified, uncomplicated: Secondary | ICD-10-CM | POA: Diagnosis not present

## 2014-06-22 DIAGNOSIS — I471 Supraventricular tachycardia: Secondary | ICD-10-CM

## 2014-06-22 DIAGNOSIS — I498 Other specified cardiac arrhythmias: Secondary | ICD-10-CM | POA: Insufficient documentation

## 2014-06-22 DIAGNOSIS — B1920 Unspecified viral hepatitis C without hepatic coma: Secondary | ICD-10-CM | POA: Diagnosis not present

## 2014-06-22 HISTORY — PX: ABLATION: SHX5711

## 2014-06-22 HISTORY — PX: SUPRAVENTRICULAR TACHYCARDIA ABLATION: SHX5492

## 2014-06-22 LAB — BASIC METABOLIC PANEL
ANION GAP: 11 (ref 5–15)
BUN: 14 mg/dL (ref 6–23)
CALCIUM: 8.8 mg/dL (ref 8.4–10.5)
CO2: 24 mEq/L (ref 19–32)
Chloride: 104 mEq/L (ref 96–112)
Creatinine, Ser: 0.83 mg/dL (ref 0.50–1.35)
GFR calc Af Amer: >90 mL/min (ref >90)
Glucose, Bld: 119 mg/dL — ABNORMAL HIGH (ref 70–99)
Potassium: 4.6 mEq/L (ref 3.7–5.3)
SODIUM: 139 meq/L (ref 137–147)

## 2014-06-22 LAB — CBC
HCT: 46.4 % (ref 39.0–52.0)
Hemoglobin: 16.2 g/dL (ref 13.0–17.0)
MCH: 32.9 pg (ref 26.0–34.0)
MCHC: 34.9 g/dL (ref 30.0–36.0)
MCV: 94.3 fL (ref 78.0–100.0)
PLATELETS: 144 10*3/uL — AB (ref 150–400)
RBC: 4.92 MIL/uL (ref 4.22–5.81)
RDW: 13.2 % (ref 11.5–15.5)
WBC: 5.9 10*3/uL (ref 4.0–10.5)

## 2014-06-22 LAB — GLUCOSE, CAPILLARY: Glucose-Capillary: 79 mg/dL (ref 70–99)

## 2014-06-22 SURGERY — SUPRAVENTRICULAR TACHYCARDIA ABLATION
Anesthesia: LOCAL

## 2014-06-22 MED ORDER — SODIUM CHLORIDE 0.9 % IV SOLN: 250.0000 mL | INTRAVENOUS | Status: DC | PRN

## 2014-06-22 MED ORDER — ONDANSETRON HCL 4 MG/2ML IJ SOLN: 4.0000 mg | Freq: Four times a day (QID) | INTRAMUSCULAR | Status: DC | PRN

## 2014-06-22 MED ORDER — MIDAZOLAM HCL 5 MG/5ML IJ SOLN
INTRAMUSCULAR | Status: AC
  Filled 2014-06-22: qty 5

## 2014-06-22 MED ORDER — ACETAMINOPHEN 325 MG PO TABS: 650.0000 mg | ORAL_TABLET | ORAL | Status: DC | PRN

## 2014-06-22 MED ORDER — BUPIVACAINE HCL (PF) 0.25 % IJ SOLN
INTRAMUSCULAR | Status: AC
  Filled 2014-06-22: qty 30

## 2014-06-22 MED ORDER — FENTANYL CITRATE 0.05 MG/ML IJ SOLN
INTRAMUSCULAR | Status: AC
  Filled 2014-06-22: qty 2

## 2014-06-22 MED ORDER — ISOPROTERENOL HCL 0.2 MG/ML IJ SOLN
INTRAMUSCULAR | Status: AC
Start: 2014-06-22 — End: 2014-06-22
  Filled 2014-06-22: qty 5

## 2014-06-22 MED ORDER — HEPARIN (PORCINE) IN NACL 2-0.9 UNIT/ML-% IJ SOLN
INTRAMUSCULAR | Status: AC
  Filled 2014-06-22: qty 500

## 2014-06-22 MED ORDER — SODIUM CHLORIDE 0.9 % IJ SOLN: 3.0000 mL | Freq: Two times a day (BID) | INTRAMUSCULAR | Status: DC

## 2014-06-22 MED ORDER — SODIUM CHLORIDE 0.9 % IJ SOLN: 3.0000 mL | INTRAMUSCULAR | Status: DC | PRN

## 2014-06-22 NOTE — Progress Notes (Signed)
Site area: right IJ Site Prior to Removal:  Level 0 Pressure Applied For: 15 minutes Manual:   yes Patient Status During Pull:  Stable Post Pull Site:  Level  0 Post Pull Instructions Given:  yes Post Pull Pulses Present: n/a Dressing Applied:  tegaderm Bedrest begins @ n/a Comments: Patient held breath and performed valsalva during sheath pull as instructed. No complications

## 2014-06-22 NOTE — CV Procedure (Signed)
Procedure Note   Procedure: Electrophysiologic study and catheter ablation of AV node reentrant tachycardia   Preoperative diagnosis: Recurrent and medically refractory SVT   Postoperative diagnosis: AV node reentrant tachycardia, status post catheter ablation   Description of procedure: After informed consent was obtained, the patient was taken to the diagnostic electrophysiologic laboratory in the fasting state. After the usual preparation and draping, intravenous Versed and that now were used for sedation. A 6 French hexapolar catheter was inserted percutaneously into the right jugular vein and advanced to the coronary sinus. A 6 French quadripolar catheter was inserted percutaneously the right femoral vein and advanced to the right ventricle. A 6 French quadripolar catheter was inserted percutaneously the right femoral vein and advanced to the His bundle region. After measuring the basic intervals, rapid ventricular pacing was carried out from the right ventricle at a pacing cycle of 600 ms, and stepwise decrease down to 500 where VA block was demonstrated.The atrial activation remained midline and decremental. Programmed ventricular stimulation was carried out from the right ventricle at a cycle length of 600 ms. The S1-S2 was decreased from 540 ms down to 500 ms. During programmed ventricular stimulation, the atrial activation sequence was midline and decremental. Next, programmed atrial stimulation was carried out from the atrium at a pacing cycle length of 500 ms. The S1 and S2 interval step was decreased from 440 ms down to 240 ms. During programmed atrial stimulation, there was no inducible SVT. An AH jump and echo beats were noted however. Next rapid atrial pacing was carried out from the coronary sinus at a pacing cycle length of 500 ms. The patient interval was decreased down to 340 ms, resulting in AV block. At this point, isoproterenol was infused at rates of 1-4 mics per minute. Additional  rapid ventricular pacing was carried out at a pacing cycle length of 590 ms, demonstrating the induction of SVT. The SVT was noted to be a short RP tachycardia at a cycle length of 490 ms. During tachycardia, the SVT were warm, and a cycle length would decrease down to 470 ms. PVCs placed at that time of His bundle refractoriness demonstrated no atrial preexcitation. During tachycardia, ventricular pacing resulted in a VAV conduction sequence. A diagnosis of AV node reentrant tachycardia was made. Mapping of the slow pathway region was carried out. The slow pathway was quite large. 6 radiofrequency energy applications were delivered. During RF energy application, there was accelerated junctional rhythm. VA block was not observed. Following ablation, additional rapid atrial pacing, and programmed atrial stimulation was carried out from the coronary sinus. There was no inducible SVT. Ventricular pacing was carried out, demonstrating no SVT.There was a single residual jump and echo beat. The patient was observed for 20 minutes. He had no recurrent SVT. The catheters were removed, and the patient was returned to the recovery area for removal of the sheaths.  Complications: There were no immediate she could complications  Results.  1. Baseline ECG. Baseline ECG demonstrated normal sinus rhythm  2. Baseline intervals. The sinus node cycle length was 877 ms. The AH interval was 64 ms. The HV interval was 41 ms. The QRS duration 98 ms. Following ablation, there is no appreciable difference in these intervals.  3. Rapid ventricular pacing. Rapid ventricular pacing was carried out from the right ventricle a pacing cycle length of 600 ms. The pacing rate was reduced down to 500 ms. During rapid ventricular pacing, the atrial activation was midline and decremental. Pacing at 500 ms  resulted in ventricular refractoriness. On isoproterenol, rapid ventricular pacing resulted in the induction of SVT. 4. Programmed ventricular  stimulation. Programmed ventricular stimulation was carried out from the right ventricle at a pacing cycle length of 600 ms. S1-S2 interval was stepwise decreased from 540 ms down to 500 ms. Ventricular refractoriness was observed. During programmed ventricular stimulation, the atrial activation was midline and decremental.  5. Rapid atrial pacing. Rapid atrial pacing was carried out from the coronary sinus and the right atrium a pacing cycle length of 500 ms. The patient interval was decreased down to 340 ms. During rapid atrial pacing, the PR interval was less than the RR interval prior to ablation. Following ablation, the PR interval was less than the RR interval. During rapid atrial pacing there was no inducible SVT.  6. Programmed atrial stimulation. Programmed atrial stimulation was carried out from the atrium at a pacing cycle length of 500 ms. The S1-S2 interval was decreased from 440 ms down to 240 ms. During programmed atrial stimulation, the atrial activation was midline and decremental. There was no inducible SVT. 7. Arrhythmias observed. AV node reentrant tachycardia. Cycle length 470 ms. Method of initiation was with rapid atrial pacing as well as programmed atrial stimulation. Duration sustained. Method of termination, rapid atrial pacing.  8. Mapping. Mapping of the slow pathway region demonstrated that Koch's triangle was quite large.  9. Radio frequency energy application. 6 radiofrequency energy applications were delivered. During radiofrequency energy application, there was accelerated junctional rhythm.  Conclusion: Successful electrophysiologic study and catheter ablation of AV node reentrant tachycardia with 5 radiofrequency energy applications delivered in sites 7 through 5 and Koch's triangle.   Cristopher Peru, M.D.

## 2014-06-22 NOTE — Progress Notes (Signed)
Site area: right groin Site Prior to Removal:  Level 0 Pressure Applied For:15 minutes Manual:   yes Patient Status During Pull:  Stable Post Pull Site:  Level 0 Post Pull Instructions Given:  yes Post Pull Pulses Present: yes and remained palpable during sheath removal Dressing Applied:  Tegaderm Bedrest begins @ 1220 Comments:No complications

## 2014-06-22 NOTE — H&P (View-Only) (Signed)
      HPI Thomas Ball is referred today by Dr. Harrington Challenger for evaluation of SVT. He is a pleasant 53 yo man with a h/o SVT dating back 30 years. The patient notes that his episodes start and stop suddenly, are associated with sob and that he has never had syncope. He does have documented bradycardia which was made worse by his beta blocker therapy. He could not tolerate beta blockers. His episodes start and stop suddenly. Over the past 6 months, his symptoms occur daily and last hours at a time. He has documented short RP tachycardia at rates of 120/min. No Known Allergies   No current outpatient prescriptions on file.   No current facility-administered medications for this visit.     Past Medical History  Diagnosis Date  . GERD (gastroesophageal reflux disease)   . HCV (hepatitis C virus) DR. ZACKS    AUG 2013 VIRAL LOAD UNDETECTABLE  . Skin cancer 1992    removed from side of head  . Pancreatitis, gallstone   . Tachycardia   . SVT (supraventricular tachycardia)     ROS:   All systems reviewed and negative except as noted in the HPI.   Past Surgical History  Procedure Laterality Date  . Jaw broken    . Cholecystectomy    . Lipoma removals      SEVERAL  . Colonoscopy  10/04/2012    YQM:VHQIO sessile polyps measuring 3-8 mm/Moderate diverticulosis/Small internal hemorrhoids     Family History  Problem Relation Age of Onset  . Colon cancer Neg Hx      History   Social History  . Marital Status: Married    Spouse Name: N/A    Number of Children: N/A  . Years of Education: N/A   Occupational History  . concrete work    Social History Main Topics  . 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 Smokeless tobacco: Never Used     Comment: had quit for 9 mos, picked up again 2 mos ago  . Alcohol Use: No  . Drug Use: No     Comment: in the 1980s, smoke crack. None since Dec 2011.   Marland Kitchen Sexual Activity: Not on file    Other Topics Concern  . Not on file   Social History Narrative  . No narrative on file     BP 126/80  Pulse 125  Ht 5\' 5"  (1.651 m)  Wt 255 lb 9.6 oz (115.939 kg)  BMI 42.53 kg/m2  Physical Exam:  Well appearing 53 yo man, NAD HEENT: Unremarkable except for full beard Neck:  No JVD, no thyromegally Back:  No CVA tenderness Lungs:  Clear with no wheezes HEART:  Regular brady rhythm, no murmurs, no rubs, no clicks Abd:  soft, obese, positive bowel sounds, no organomegally, no rebound, no guarding Ext:  2 plus pulses, no edema, no cyanosis, no clubbing Skin:  No rashes no nodules Neuro:  CN II through XII intact, motor grossly intact  EKG - sinus bradycardia transitioning into SVT.   Assess/Plan:

## 2014-06-22 NOTE — Interval H&P Note (Signed)
History and Physical Interval Note:  06/22/2014 8:38 AM  Thomas Ball  has presented today for surgery, with the diagnosis of SVT  The various methods of treatment have been discussed with the patient and family. After consideration of risks, benefits and other options for treatment, the patient has consented to  Procedure(s): SUPRAVENTRICULAR TACHYCARDIA ABLATION (N/A) as a surgical intervention .  The patient's history has been reviewed, patient examined, no change in status, stable for surgery.  I have reviewed the patient's chart and labs.  Questions were answered to the patient's satisfaction.     Mikle Bosworth.D.

## 2014-06-23 DIAGNOSIS — B1920 Unspecified viral hepatitis C without hepatic coma: Secondary | ICD-10-CM | POA: Diagnosis not present

## 2014-06-23 DIAGNOSIS — F17200 Nicotine dependence, unspecified, uncomplicated: Secondary | ICD-10-CM | POA: Diagnosis not present

## 2014-06-23 DIAGNOSIS — I498 Other specified cardiac arrhythmias: Secondary | ICD-10-CM | POA: Diagnosis not present

## 2014-06-23 DIAGNOSIS — K219 Gastro-esophageal reflux disease without esophagitis: Secondary | ICD-10-CM | POA: Diagnosis not present

## 2014-06-23 DIAGNOSIS — I471 Supraventricular tachycardia: Secondary | ICD-10-CM

## 2014-06-23 NOTE — Discharge Summary (Signed)
     ELECTROPHYSIOLOGY PROCEDURE DISCHARGE SUMMARY    Patient ID: Thomas Ball,  MRN: [id number], DOB/AGE: [birth year] 53 y.o.  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  GERD 2.  HCV  No Known Allergies   Procedures This Admission:  1.  Electrophysiology study and radiofrequency catheter ablation on 06-22-14 by Dr Lovena Le.  This study demonstrated AVNRT which was successfully ablated.  Following ablation there were no inducible arrhythmias.  There were no early apparent complications.   Brief HPI: Thomas Ball is a 53 y.o. male with a 30 year history of tachy palpitations.  They are associated with shortness of breath and have been increasing in frequency and duration over time. He was referred to EP in the outpatient setting for treatment options. Risks, benefits, and alternatives to ablation were reviewed with the patient who wished to proceed.   Hospital Course:  The patient was admitted and underwent ablation of AVNRT with details as outlined above.  He was monitored on telemetry overnight which demonstrated sinus rhythm.  Neck and groin incisions were without complication.  He was evaluated by Dr Lovena Le and considered stable for discharge to home.  He will be seen in follow up in 4 weeks.   Discharge Vitals: Blood pressure 117/90, pulse 71, temperature 98.4 F (36.9 C), temperature source Oral, resp. rate 18, height 5\' 5"  (1.651 m), weight 258 lb 6.1 oz (117.2 kg), SpO2 97.00%.   Labs:   Lab Results  Component Value Date   WBC 5.9 06/22/2014   HGB 16.2 06/22/2014   HCT 46.4 06/22/2014   MCV 94.3 06/22/2014   PLT 144* 06/22/2014    Recent Labs Lab 06/22/14 0735  NA 139  K 4.6  CL 104  CO2 24  BUN 14  CREATININE 0.83  CALCIUM 8.8  GLUCOSE  119*    Discharge Medications:    Medication List    Notice   You have not been prescribed any medications.      Disposition:     Duration of Discharge Encounter: less than 30 minutes including physician time.  Signed,  Mikle Bosworth.D.

## 2014-06-25 ENCOUNTER — Telehealth: Payer: Self-pay | Admitting: Internal Medicine

## 2014-06-25 ENCOUNTER — Emergency Department (HOSPITAL_COMMUNITY)
Admission: EM | Admit: 2014-06-25 | Discharge: 2014-06-25 | Disposition: A | Discharge status: Home / Self Care | Payer: Medicaid Other | Attending: Emergency Medicine | Admitting: Emergency Medicine

## 2014-06-25 ENCOUNTER — Emergency Department (HOSPITAL_COMMUNITY): Payer: Medicaid Other

## 2014-06-25 ENCOUNTER — Encounter (HOSPITAL_COMMUNITY): Payer: Self-pay | Admitting: Emergency Medicine

## 2014-06-25 DIAGNOSIS — Z8619 Personal history of other infectious and parasitic diseases: Secondary | ICD-10-CM | POA: Insufficient documentation

## 2014-06-25 DIAGNOSIS — Z8679 Personal history of other diseases of the circulatory system: Secondary | ICD-10-CM | POA: Insufficient documentation

## 2014-06-25 DIAGNOSIS — Z8719 Personal history of other diseases of the digestive system: Secondary | ICD-10-CM | POA: Insufficient documentation

## 2014-06-25 DIAGNOSIS — R079 Chest pain, unspecified: Secondary | ICD-10-CM | POA: Diagnosis not present

## 2014-06-25 DIAGNOSIS — R5381 Other malaise: Secondary | ICD-10-CM | POA: Insufficient documentation

## 2014-06-25 DIAGNOSIS — R0981 Nasal congestion: Secondary | ICD-10-CM | POA: Insufficient documentation

## 2014-06-25 DIAGNOSIS — Z720 Tobacco use: Secondary | ICD-10-CM | POA: Diagnosis not present

## 2014-06-25 DIAGNOSIS — Z85828 Personal history of other malignant neoplasm of skin: Secondary | ICD-10-CM | POA: Insufficient documentation

## 2014-06-25 DIAGNOSIS — R51 Headache: Secondary | ICD-10-CM | POA: Diagnosis not present

## 2014-06-25 DIAGNOSIS — R42 Dizziness and giddiness: Secondary | ICD-10-CM | POA: Diagnosis present

## 2014-06-25 LAB — URINALYSIS, ROUTINE W REFLEX MICROSCOPIC
Bilirubin Urine: NEGATIVE
Glucose, UA: NEGATIVE mg/dL
Hgb urine dipstick: NEGATIVE
Ketones, ur: NEGATIVE mg/dL
Leukocytes, UA: NEGATIVE
Nitrite: NEGATIVE
Protein, ur: NEGATIVE mg/dL
Specific Gravity, Urine: 1.02 (ref 1.005–1.030)
Urobilinogen, UA: 1 mg/dL (ref 0.0–1.0)
pH: 6.5 (ref 5.0–8.0)

## 2014-06-25 LAB — BASIC METABOLIC PANEL
Anion gap: 10 (ref 5–15)
BUN: 15 mg/dL (ref 6–23)
CO2: 25 mEq/L (ref 19–32)
Calcium: 8.6 mg/dL (ref 8.4–10.5)
Chloride: 104 mEq/L (ref 96–112)
Creatinine, Ser: 0.8 mg/dL (ref 0.50–1.35)
GFR calc Af Amer: >90 mL/min (ref >90)
GFR calc non Af Amer: >90 mL/min (ref >90)
Glucose, Bld: 144 mg/dL — ABNORMAL HIGH (ref 70–99)
Potassium: 4.2 mEq/L (ref 3.7–5.3)
Sodium: 139 mEq/L (ref 137–147)

## 2014-06-25 LAB — CBC WITH DIFFERENTIAL/PLATELET
Basophils Absolute: 0 10*3/uL (ref 0.0–0.1)
Basophils Relative: 0 % (ref 0–1)
EOS PCT: 2 % (ref 0–5)
Eosinophils Absolute: 0.2 10*3/uL (ref 0.0–0.7)
HCT: 46.2 % (ref 39.0–52.0)
HEMOGLOBIN: 16.4 g/dL (ref 13.0–17.0)
LYMPHS ABS: 1.6 10*3/uL (ref 0.7–4.0)
Lymphocytes Relative: 22 % (ref 12–46)
MCH: 33.5 pg (ref 26.0–34.0)
MCHC: 35.5 g/dL (ref 30.0–36.0)
MCV: 94.3 fL (ref 78.0–100.0)
MONOS PCT: 9 % (ref 3–12)
Monocytes Absolute: 0.6 10*3/uL (ref 0.1–1.0)
Neutro Abs: 4.8 10*3/uL (ref 1.7–7.7)
Neutrophils Relative %: 67 % (ref 43–77)
Platelets: 140 10*3/uL — ABNORMAL LOW (ref 150–400)
RBC: 4.9 MIL/uL (ref 4.22–5.81)
RDW: 13.1 % (ref 11.5–15.5)
WBC: 7.2 10*3/uL (ref 4.0–10.5)

## 2014-06-25 LAB — TROPONIN I

## 2014-06-25 MED ORDER — SODIUM CHLORIDE 0.9 % IV SOLN
INTRAVENOUS | Status: DC
  Administered 2014-06-25: 1000 mL via INTRAVENOUS

## 2014-06-25 NOTE — Telephone Encounter (Signed)
Spoke with patient and he is feeling tired, headache and has a cough.  He sounds like he has a head cold and is congested.  He is s/p ablation on 06/22/14.  I let him know that he should take it easy for a week post procedure. The tiredness should get better with time.   I have recommended him to follow up with his PCP.  He does not have one so I have asked that he go to an urgent care to be seen for congestion.  He will call me back if needed

## 2014-06-25 NOTE — ED Notes (Signed)
Pt reports had heart ablation on Monday by Dr. Lovena Le at [facility]. Since has had dizziness, fatigue and intermittent sharp chest pains.

## 2014-06-25 NOTE — Telephone Encounter (Signed)
New message           Pt feeling tired and with headaches and dizziness / is this normal after an ablation

## 2014-06-25 NOTE — Telephone Encounter (Signed)
Follow up     Pt says he called earlier because he is feeling tired and has a headache.  He had an ablation on Monday.  Please call

## 2014-06-25 NOTE — ED Provider Notes (Addendum)
CSN: [id number]     Arrival date & time 06/25/14  1553 History   First MD Initiated Contact with Patient 06/25/14 1601     Chief Complaint  Patient presents with  . Post-op Problem  . Dizziness  . Chest Pain     (Consider location/radiation/quality/duration/timing/severity/associated sxs/prior Treatment) HPI  Thomas Ball is a 53 y.o. male who presents for evaluation of headache, weakness, nasal congestion, and dizziness. Symptoms are gradual in onset and present for several days. He had cardiac ablation done, one week ago, for periods of SVT. The procedure was apparently uncomplicated, he was discharged on no medications. He does not have a cardiac pacemaker. There's been no syncope or near syncope. He denies chest pain, shortness of breath, nausea, vomiting, change in bowel or urinary habits. There are no other known modifying factors.   Past Medical History  Diagnosis Date  . GERD (gastroesophageal reflux disease)   . HCV (hepatitis C virus) DR. ZACKS    AUG 2013 VIRAL LOAD UNDETECTABLE  . Skin cancer 1992    removed from side of head  . Pancreatitis, gallstone   . Tachycardia   . SVT (supraventricular tachycardia)    Past Surgical History  Procedure Laterality Date  . Jaw broken    . Cholecystectomy    . Lipoma removals      SEVERAL  . Colonoscopy  10/04/2012    TIR:WERXV sessile polyps measuring 3-8 mm/Moderate diverticulosis/Small internal hemorrhoids   Family History  Problem Relation Age of Onset  . Colon cancer Neg Hx    History  Substance Use Topics  . 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 Smokeless tobacco: Never Used     Comment: had quit for 9 mos, picked up again 2 mos ago  . Alcohol Use: No    Review of Systems  All other systems reviewed and are negative.     Allergies  Review of patient's allergies indicates no known allergies.  Home Medications   Prior to Admission medications    Not on File   BP 97/60  Pulse 84  Temp(Src) 97.8 F (36.6 C) (Oral)  Resp 18  Ht 5\' 5"  (1.651 m)  Wt 250 lb (113.399 kg)  BMI 41.60 kg/m2  SpO2 98% Physical Exam  Nursing note and vitals reviewed. Constitutional: He is oriented to person, place, and time. He appears well-developed and well-nourished. No distress.  HENT:  Head: Normocephalic and atraumatic.  Right Ear: External ear normal.  Left Ear: External ear normal.  Mild tenderness to percussion over both the frontal sinuses and maxillary sinuses, bilaterally.  Eyes: Conjunctivae and EOM are normal. Pupils are equal, round, and reactive to light.  Neck: Normal range of motion and phonation normal. Neck supple.  Cardiovascular: Normal rate, regular rhythm and normal heart sounds.   Pulmonary/Chest: Effort normal and breath sounds normal. He exhibits no bony tenderness.  Abdominal: Soft. There is no tenderness.  Musculoskeletal: Normal range of motion.  Neurological: He is alert and oriented to person, place, and time. No cranial nerve deficit or sensory deficit. He exhibits normal muscle tone. Coordination normal.  Skin: Skin is warm, dry and intact.  Psychiatric: He has a normal mood and affect. His behavior is normal. Judgment and thought content normal.    ED Course  Procedures (including critical care time) Medications  0.9 %  sodium chloride infusion (not administered)    Patient Vitals for the past 24  hrs:  BP Temp Temp src Pulse Resp SpO2 Height Weight  06/25/14 1554 97/60 mmHg 97.8 F (36.6 C) Oral 84 18 98 % 5\' 5"  (1.651 m) 250 lb (113.399 kg)    4:38 PM Reevaluation with update and discussion. After initial assessment and treatment, an updated evaluation reveals no further c/o. Findings discussed with patient, all questions answered. Abdulaziz Toman L    Labs Review Labs Reviewed  URINE CULTURE  CBC WITH DIFFERENTIAL  BASIC METABOLIC PANEL  URINALYSIS, ROUTINE W REFLEX MICROSCOPIC  TROPONIN I     Imaging Review No results found.   EKG Interpretation None        Date: 06/25/14- Hyperlink is inactive  Rate: 70  Rhythm: normal sinus rhythm  QRS Axis: normal  PR and QT Intervals: normal  ST/T Wave abnormalities: normal  PR and QRS Conduction Disutrbances:none  Narrative Interpretation:   Old EKG Reviewed: changes noted- since last tracing, PVC resolved    MDM   Final diagnoses:  Malaise    On specific weakness, and dizziness with negative EGD findings. Severe sinusitis, bacteremia, metabolic instability, or cardiac ischemia.   Nursing Notes Reviewed/ Care Coordinated Applicable Imaging Reviewed Interpretation of Laboratory Data incorporated into ED treatment  The patient appears reasonably screened and/or stabilized for discharge and I doubt any other medical condition or other [facility] requiring further screening, evaluation, or treatment in the ED at this time prior to discharge.  Plan: Home Medications- none; Home Treatments- rest; return here if the recommended treatment, does not improve the symptoms; Recommended follow up- PCP prn   Richarda Blade, MD 06/25/14 1816  Richarda Blade, MD 06/25/14 1950

## 2014-06-25 NOTE — Discharge Instructions (Signed)
Get plenty of rest, and try to eat and drink regularly. See the doctor of your choice, as needed, for problems.    Fatigue Fatigue is a feeling of tiredness, lack of energy, lack of motivation, or feeling tired all the time. Having enough rest, good nutrition, and reducing stress will normally reduce fatigue. Consult your caregiver if it persists. The nature of your fatigue will help your caregiver to find out its cause. The treatment is based on the cause.  CAUSES  There are many causes for fatigue. Most of the time, fatigue can be traced to one or more of your habits or routines. Most causes fit into one or more of three general areas. They are: Lifestyle problems  Sleep disturbances.  Overwork.  Physical exertion.  Unhealthy habits.  Poor eating habits or eating disorders.  Alcohol and/or drug use .  Lack of proper nutrition (malnutrition). Psychological problems  Stress and/or anxiety problems.  Depression.  Grief.  Boredom. Medical Problems or Conditions  Anemia.  Pregnancy.  Thyroid gland problems.  Recovery from major surgery.  Continuous pain.  Emphysema or asthma that is not well controlled  Allergic conditions.  Diabetes.  Infections (such as mononucleosis).  Obesity.  Sleep disorders, such as sleep apnea.  Heart failure or other heart-related problems.  Cancer.  Kidney disease.  Liver disease.  Effects of certain medicines such as antihistamines, cough and cold remedies, prescription pain medicines, heart and blood pressure medicines, drugs used for treatment of cancer, and some antidepressants. SYMPTOMS  The symptoms of fatigue include:   Lack of energy.  Lack of drive (motivation).  Drowsiness.  Feeling of indifference to the surroundings. DIAGNOSIS  The details of how you feel help guide your caregiver in finding out what is causing the fatigue. You will be asked about your present and past health condition. It is important to  review all medicines that you take, including prescription and non-prescription items. A thorough exam will be done. You will be questioned about your feelings, habits, and normal lifestyle. Your caregiver may suggest blood tests, urine tests, or other tests to look for common medical causes of fatigue.  TREATMENT  Fatigue is treated by correcting the underlying cause. For example, if you have continuous pain or depression, treating these causes will improve how you feel. Similarly, adjusting the dose of certain medicines will help in reducing fatigue.  HOME CARE INSTRUCTIONS   Try to get the required amount of good sleep every night.  Eat a healthy and nutritious diet, and drink enough water throughout the day.  Practice ways of relaxing (including yoga or meditation).  Exercise regularly.  Make plans to change situations that cause stress. Act on those plans so that stresses decrease over time. Keep your work and personal routine reasonable.  Avoid street drugs and minimize use of alcohol.  Start taking a daily multivitamin after consulting your caregiver. SEEK MEDICAL CARE IF:   You have persistent tiredness, which cannot be accounted for.  You have fever.  You have unintentional weight loss.  You have headaches.  You have disturbed sleep throughout the night.  You are feeling sad.  You have constipation.  You have dry skin.  You have gained weight.  You are taking any new or different medicines that you suspect are causing fatigue.  You are unable to sleep at night.  You develop any unusual swelling of your legs or other parts of your body. SEEK IMMEDIATE MEDICAL CARE IF:   You are feeling confused.  Your vision is blurred.  You feel faint or pass out.  You develop severe headache.  You develop severe abdominal, pelvic, or back pain.  You develop chest pain, shortness of breath, or an irregular or fast heartbeat.  You are unable to pass a normal amount of  urine.  You develop abnormal bleeding such as bleeding from the rectum or you vomit blood.  You have thoughts about harming yourself or committing suicide.  You are worried that you might harm someone else. MAKE SURE YOU:   Understand these instructions.  Will watch your condition.  Will get help right away if you are not doing well or get worse. Document Released: 07/09/2007 Document Revised: 12/04/2011 Document Reviewed: 01/13/2014 [facility] Patient Information 2015 [place], [place]. This information is not intended to replace advice given to you by your health care provider. Make sure you discuss any questions you have with your health care provider.

## 2014-06-26 LAB — URINE CULTURE
Colony Count: NO GROWTH
Culture: NO GROWTH

## 2014-06-27 ENCOUNTER — Encounter (HOSPITAL_COMMUNITY): Payer: Self-pay | Admitting: *Deleted

## 2014-07-23 ENCOUNTER — Encounter: Payer: Self-pay | Admitting: Internal Medicine

## 2014-08-03 ENCOUNTER — Encounter: Payer: Self-pay | Admitting: Gastroenterology

## 2014-08-18 ENCOUNTER — Telehealth: Payer: Self-pay

## 2014-08-18 NOTE — Telephone Encounter (Signed)
Pt is call to see about having his EGD done before the end of the year as told from [facility]. Please advise if he will need an office appointment or update his H&P over the phone.

## 2014-08-19 ENCOUNTER — Ambulatory Visit (INDEPENDENT_AMBULATORY_CARE_PROVIDER_SITE_OTHER): Payer: Self-pay | Admitting: Internal Medicine

## 2014-08-19 ENCOUNTER — Telehealth: Payer: Self-pay | Admitting: Internal Medicine

## 2014-08-19 ENCOUNTER — Encounter: Payer: Self-pay | Admitting: Internal Medicine

## 2014-08-19 VITALS — BP 122/86 | HR 70 | Ht 65.0 in | Wt 265.0 lb

## 2014-08-19 DIAGNOSIS — E669 Obesity, unspecified: Secondary | ICD-10-CM

## 2014-08-19 DIAGNOSIS — F17200 Nicotine dependence, unspecified, uncomplicated: Secondary | ICD-10-CM

## 2014-08-19 DIAGNOSIS — Z720 Tobacco use: Secondary | ICD-10-CM

## 2014-08-19 DIAGNOSIS — I471 Supraventricular tachycardia: Secondary | ICD-10-CM

## 2014-08-19 NOTE — Assessment & Plan Note (Signed)
He is status post catheter ablation and doing well with no recurrent arrhythmias. He will undergo watchful waiting.

## 2014-08-19 NOTE — Progress Notes (Signed)
      HPI Thomas Ball returns today for follow-up. 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 In the interim, he had an episode of bronchitis, which has resolved. He has had no heart racing. He does admit to dietary indiscretion. He is gone back to work, Programmer, [employer]. No Known Allergies   No current outpatient prescriptions on file.   No current facility-administered medications for this visit.     Past Medical History  Diagnosis Date  . GERD (gastroesophageal reflux disease)   . HCV (hepatitis C virus) DR. ZACKS    AUG 2013 VIRAL LOAD UNDETECTABLE  . Skin cancer 1992    removed from side of head  . Pancreatitis, gallstone   . SVT (supraventricular tachycardia)     ROS:   All systems reviewed and negative except as noted in the HPI.   Past Surgical History  Procedure Laterality Date  . Jaw broken    . Cholecystectomy    . Lipoma removals      SEVERAL  . Colonoscopy  10/04/2012    LKJ:ZPHXT sessile polyps measuring 3-8 mm/Moderate diverticulosis/Small internal hemorrhoids  . Ablation  06-22-14    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     Family History  Problem Relation Age of Onset  . Colon cancer Neg Hx   . Emphysema Mother   . Hyperlipidemia Father   . Stroke Father   . Diabetes Father      History   Social History  . Marital Status: Married    Spouse Name: N/A    Number of Children: N/A  . Years of Education: N/A   Occupational History  . concrete work    Social History Main Topics  . 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 Smokeless tobacco: Never Used     Comment: had quit for 9 mos, picked up again 2 mos ago  . Alcohol Use: No  . Drug Use: No     Comment: in the 1980s, smoke crack. None since Dec 2011.   Marland Kitchen Sexual Activity: Not on file   Other Topics Concern  . Not on file    Social History Narrative     There were no vitals taken for this visit.  Physical Exam:  Well appearing NAD HEENT: Unremarkable Neck:  No JVD, no thyromegally Lymphatics:  No adenopathy Back:  No CVA tenderness Lungs:  Clear with no wheezes, rales, or rhonchi. HEART:  Regular rate rhythm, no murmurs, no rubs, no clicks Abd:  soft, obese, positive bowel sounds, no organomegally, no rebound, no guarding Ext:  2 plus pulses, no edema, no cyanosis, no clubbing Skin:  No rashes no nodules Neuro:  CN II through XII intact, motor grossly intact  EKG - normal sinus rhythm  Assess/Plan:

## 2014-08-19 NOTE — Telephone Encounter (Signed)
lmtcb X1 for pt to relay recs.   

## 2014-08-19 NOTE — Patient Instructions (Signed)
Dr. Taylor will see you back on an as needed basis.  Your physician recommends that you continue on your current medications as directed. Please refer to the Current Medication list given to you today.  

## 2014-08-19 NOTE — Telephone Encounter (Signed)
Spoke with pt, states that he is about 2 months past due for a chest ct.  Pt last seen 11/2013 with no note of a ct.  Dr. Melvyn Novas, would you like [place] to place an order for this?  Thanks!

## 2014-08-19 NOTE — Telephone Encounter (Signed)
f/u ct 11/2013 no change, no further scans needed

## 2014-08-19 NOTE — Assessment & Plan Note (Signed)
He is encouraged to stop smoking. 

## 2014-08-19 NOTE — Assessment & Plan Note (Signed)
I have strongly encouraged the patient to lose weight. He states that he will try to eat less.

## 2014-08-21 NOTE — Telephone Encounter (Signed)
lmomtcb  

## 2014-08-21 NOTE — Telephone Encounter (Signed)
Pt advised. Berdene Askari, CMA  

## 2014-08-23 NOTE — Telephone Encounter (Signed)
[facility] FOR EGD IN DEC 2015, DX: SCREEN FOR VARICES/CIRRHOSIS.

## 2014-08-24 ENCOUNTER — Ambulatory Visit (HOSPITAL_COMMUNITY): Payer: MEDICAID

## 2014-08-25 ENCOUNTER — Other Ambulatory Visit: Payer: Self-pay

## 2014-08-25 DIAGNOSIS — K7460 Unspecified cirrhosis of liver: Secondary | ICD-10-CM

## 2014-08-25 DIAGNOSIS — I8500 Esophageal varices without bleeding: Secondary | ICD-10-CM

## 2014-08-25 NOTE — Telephone Encounter (Signed)
REVIEWED-NO ADDITIONAL RECOMMENDATIONS. 

## 2014-08-25 NOTE — Telephone Encounter (Signed)
Pt is set up for EGD on 09/04/14 @ 1:45. Instructions are in the mail. I updated his medication list and there was nothing to add to the list.Pt is aware of appointment.

## 2014-08-26 ENCOUNTER — Ambulatory Visit (HOSPITAL_COMMUNITY)
Admission: RE | Admit: 2014-08-26 | Discharge: 2014-08-26 | Disposition: A | Discharge status: Home / Self Care | Payer: Self-pay | Source: Ambulatory Visit | Attending: Gastroenterology | Admitting: Gastroenterology

## 2014-08-26 DIAGNOSIS — K760 Fatty (change of) liver, not elsewhere classified: Secondary | ICD-10-CM | POA: Insufficient documentation

## 2014-08-26 DIAGNOSIS — K7460 Unspecified cirrhosis of liver: Secondary | ICD-10-CM | POA: Insufficient documentation

## 2014-08-26 DIAGNOSIS — K219 Gastro-esophageal reflux disease without esophagitis: Secondary | ICD-10-CM | POA: Insufficient documentation

## 2014-08-26 DIAGNOSIS — R079 Chest pain, unspecified: Secondary | ICD-10-CM | POA: Insufficient documentation

## 2014-08-26 DIAGNOSIS — F17200 Nicotine dependence, unspecified, uncomplicated: Secondary | ICD-10-CM | POA: Insufficient documentation

## 2014-08-26 DIAGNOSIS — J449 Chronic obstructive pulmonary disease, unspecified: Secondary | ICD-10-CM | POA: Insufficient documentation

## 2014-08-26 DIAGNOSIS — K7469 Other cirrhosis of liver: Secondary | ICD-10-CM

## 2014-08-26 DIAGNOSIS — Z9049 Acquired absence of other specified parts of digestive tract: Secondary | ICD-10-CM | POA: Insufficient documentation

## 2014-08-27 LAB — AFP TUMOR MARKER: AFP-Tumor Marker: 6.2 ng/mL — ABNORMAL HIGH (ref <6.1)

## 2014-09-03 ENCOUNTER — Telehealth: Payer: Self-pay

## 2014-09-03 ENCOUNTER — Encounter (HOSPITAL_COMMUNITY): Payer: Self-pay | Admitting: Internal Medicine

## 2014-09-03 NOTE — Telephone Encounter (Signed)
Pt called for [place] results and to make sure he is still to do the endoscopy tomorrow. I told him he is still on and I will let Dr. Oneida Alar know he has called about these results.

## 2014-09-04 ENCOUNTER — Encounter (HOSPITAL_COMMUNITY): Payer: Self-pay | Admitting: *Deleted

## 2014-09-04 ENCOUNTER — Encounter (HOSPITAL_COMMUNITY)
Admission: RE | Disposition: A | Discharge status: Home / Self Care | Payer: Self-pay | Source: Ambulatory Visit | Attending: Gastroenterology

## 2014-09-04 ENCOUNTER — Ambulatory Visit (HOSPITAL_COMMUNITY)
Admission: RE | Admit: 2014-09-04 | Discharge: 2014-09-04 | Disposition: A | Discharge status: Home / Self Care | Payer: Self-pay | Source: Ambulatory Visit | Attending: Gastroenterology | Admitting: Gastroenterology

## 2014-09-04 DIAGNOSIS — I8500 Esophageal varices without bleeding: Secondary | ICD-10-CM

## 2014-09-04 DIAGNOSIS — Z9049 Acquired absence of other specified parts of digestive tract: Secondary | ICD-10-CM | POA: Insufficient documentation

## 2014-09-04 DIAGNOSIS — Z836 Family history of other diseases of the respiratory system: Secondary | ICD-10-CM | POA: Insufficient documentation

## 2014-09-04 DIAGNOSIS — K851 Biliary acute pancreatitis: Secondary | ICD-10-CM | POA: Insufficient documentation

## 2014-09-04 DIAGNOSIS — K2980 Duodenitis without bleeding: Secondary | ICD-10-CM | POA: Insufficient documentation

## 2014-09-04 DIAGNOSIS — K209 Esophagitis, unspecified: Secondary | ICD-10-CM

## 2014-09-04 DIAGNOSIS — Z823 Family history of stroke: Secondary | ICD-10-CM | POA: Insufficient documentation

## 2014-09-04 DIAGNOSIS — Z800 Family history of malignant neoplasm of digestive organs: Secondary | ICD-10-CM | POA: Insufficient documentation

## 2014-09-04 DIAGNOSIS — Z833 Family history of diabetes mellitus: Secondary | ICD-10-CM | POA: Insufficient documentation

## 2014-09-04 DIAGNOSIS — K2210 Ulcer of esophagus without bleeding: Secondary | ICD-10-CM | POA: Insufficient documentation

## 2014-09-04 DIAGNOSIS — B1920 Unspecified viral hepatitis C without hepatic coma: Secondary | ICD-10-CM | POA: Insufficient documentation

## 2014-09-04 DIAGNOSIS — I471 Supraventricular tachycardia: Secondary | ICD-10-CM | POA: Insufficient documentation

## 2014-09-04 DIAGNOSIS — Z85828 Personal history of other malignant neoplasm of skin: Secondary | ICD-10-CM | POA: Insufficient documentation

## 2014-09-04 DIAGNOSIS — K7460 Unspecified cirrhosis of liver: Secondary | ICD-10-CM

## 2014-09-04 DIAGNOSIS — K219 Gastro-esophageal reflux disease without esophagitis: Secondary | ICD-10-CM | POA: Insufficient documentation

## 2014-09-04 DIAGNOSIS — Z87898 Personal history of other specified conditions: Secondary | ICD-10-CM | POA: Insufficient documentation

## 2014-09-04 DIAGNOSIS — Z8349 Family history of other endocrine, nutritional and metabolic diseases: Secondary | ICD-10-CM | POA: Insufficient documentation

## 2014-09-04 HISTORY — PX: ESOPHAGOGASTRODUODENOSCOPY: SHX5428

## 2014-09-04 SURGERY — EGD (ESOPHAGOGASTRODUODENOSCOPY)
Anesthesia: Moderate Sedation

## 2014-09-04 MED ORDER — MEPERIDINE HCL 100 MG/ML IJ SOLN
INTRAMUSCULAR | Status: DC | PRN
  Administered 2014-09-04 (×3): 25 mg via INTRAVENOUS

## 2014-09-04 MED ORDER — LIDOCAINE VISCOUS 2 % MT SOLN
OROMUCOSAL | Status: AC
  Filled 2014-09-04: qty 15

## 2014-09-04 MED ORDER — STERILE WATER FOR IRRIGATION IR SOLN
Status: DC | PRN
  Administered 2014-09-04: 14:00:00

## 2014-09-04 MED ORDER — LIDOCAINE VISCOUS 2 % MT SOLN
OROMUCOSAL | Status: DC | PRN
  Administered 2014-09-04: 4 mL via OROMUCOSAL

## 2014-09-04 MED ORDER — OMEPRAZOLE 20 MG PO CPDR: DELAYED_RELEASE_CAPSULE | ORAL | Status: DC

## 2014-09-04 MED ORDER — MEPERIDINE HCL 100 MG/ML IJ SOLN
INTRAMUSCULAR | Status: AC
  Filled 2014-09-04: qty 2

## 2014-09-04 MED ORDER — MIDAZOLAM HCL 5 MG/5ML IJ SOLN
INTRAMUSCULAR | Status: AC
  Filled 2014-09-04: qty 10

## 2014-09-04 MED ORDER — SODIUM CHLORIDE 0.9 % IV SOLN
INTRAVENOUS | Status: DC
  Administered 2014-09-04: 13:00:00 via INTRAVENOUS

## 2014-09-04 MED ORDER — MIDAZOLAM HCL 5 MG/5ML IJ SOLN
INTRAMUSCULAR | Status: DC | PRN
  Administered 2014-09-04: 2 mg via INTRAVENOUS
  Administered 2014-09-04: 1 mg via INTRAVENOUS
  Administered 2014-09-04: 2 mg via INTRAVENOUS
  Administered 2014-09-04: 1 mg via INTRAVENOUS

## 2014-09-04 NOTE — H&P (Signed)
  Primary Care Physician:  No PCP Per Patient Primary Gastroenterologist:  Dr. Oneida Alar  Pre-Procedure History & Physical: HPI:  Thomas Ball is a 53 y.o. male here for [place].  Past Medical History  Diagnosis Date  . GERD (gastroesophageal reflux disease)   . HCV (hepatitis C virus) DR. ZACKS    AUG 2013 VIRAL LOAD UNDETECTABLE  . Skin cancer 1992    removed from side of head  . Pancreatitis, gallstone   . SVT (supraventricular tachycardia)     Past Surgical History  Procedure Laterality Date  . Jaw broken    . Cholecystectomy    . Lipoma removals      SEVERAL  . Colonoscopy  10/04/2012    ERX:[id number] sessile polyps measuring 3-8 mm/Moderate diverticulosis/Small internal hemorrhoids  . 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 Supraventricular tachycardia ablation N/A 06/22/2014    Procedure: SUPRAVENTRICULAR TACHYCARDIA ABLATION;  Surgeon: Evans Lance, MD;  Location: [facility] CATH LAB;  Service: Cardiovascular;  Laterality: N/A;    Prior to Admission medications   Medication Sig Start Date End Date Taking? Authorizing Provider  ibuprofen (ADVIL,MOTRIN) 200 MG tablet Take 400-600 mg by mouth every 6 (six) hours as needed for headache.   Yes Historical Provider, MD    Allergies as of 08/25/2014  . (No Known Allergies)    Family History  Problem Relation Age of Onset  . Colon cancer Neg Hx   . Emphysema Mother   . Hyperlipidemia Father   . Stroke Father   . Diabetes Father     History   Social History  . Marital Status: Married    Spouse Name: N/A    Number of Children: N/A  . Years of Education: N/A   Occupational History  . concrete work    Social History Main Topics  . 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 Smokeless tobacco: Never Used     Comment: had quit for 9 mos, picked up again 2 mos ago  . Alcohol Use: No  . Drug Use: No     Comment: in the 1980s, smoke crack. None  since Dec 2011.   Marland Kitchen Sexual Activity: Not on file   Other Topics Concern  . Not on file   Social History Narrative    Review of Systems: See HPI, otherwise negative ROS   Physical Exam: BP 129/88 mmHg  Pulse 68  Temp(Src) 97.8 F (36.6 C) (Oral)  Resp 16  Ht 5\' 5"  (1.651 m)  Wt 265 lb (120.203 kg)  BMI 44.10 kg/m2  SpO2 97% General:   Alert,  pleasant and cooperative in NAD Head:  Normocephalic and atraumatic. Neck:  Supple; Lungs:  Clear throughout to auscultation.    Heart:  Regular rate and rhythm. Abdomen:  Soft, nontender and nondistended. Normal bowel sounds, without guarding, and without rebound.   Neurologic:  Alert and  oriented x4;  grossly normal neurologically.  Impression/Plan:     SCREENING VARICES  PLAN:  1.EGD TODAY

## 2014-09-04 NOTE — Telephone Encounter (Signed)
DISCUSSED IN ENDO DEC 11.

## 2014-09-04 NOTE — Discharge Instructions (Signed)
You have ESOPHAGITIS, gastritis & DUODENITIS DUE TO YOUR USING IBUPROFEN. I biopsied your stomach.   CONTINUE YOUR WEIGHT LOSS EFFORTS. Lose 10-20 lbs.  START OMEPRAZOLE.  TAKE 30 MINUTES PRIOR TO YOUR MEALS TWICE DAILY FOR 3 MOS THEN ONCE DAILY FOREVER.  FOLLOW A LOW FAT DIET. SEE INFO BELOW.  YOUR BIOPSY WILL BE BACK IN 14 DAYS OR YOU CAN LOOK THEM UP ON MY CHART ON DEC 16.   FOLLOW UP MAR 2016.   UPPER ENDOSCOPY AFTER CARE Read the instructions outlined below and refer to this sheet in the next week. These discharge instructions provide you with general information on caring for yourself after you leave the hospital. While your treatment has been planned according to the most current medical practices available, unavoidable complications occasionally occur. If you have any problems or questions after discharge, call DR. Ciarra Braddy, [phone].  ACTIVITY  You may resume your regular activity, but move at a slower pace for the next 24 hours.   Take frequent rest periods for the next 24 hours.   Walking will help get rid of the air and reduce the bloated feeling in your belly (abdomen).   No driving for 24 hours (because of the medicine (anesthesia) used during the test).   You may shower.   Do not sign any important legal documents or operate any machinery for 24 hours (because of the anesthesia used during the test).    NUTRITION  Drink plenty of fluids.   You may resume your normal diet as instructed by your doctor.   Begin with a light meal and progress to your normal diet. Heavy or fried foods are harder to digest and may make you feel sick to your stomach (nauseated).   Avoid alcoholic beverages for 24 hours or as instructed.    MEDICATIONS  You may resume your normal medications.   WHAT YOU CAN EXPECT TODAY  Some feelings of bloating in the abdomen.   Passage of more gas than usual.    IF YOU HAD A BIOPSY TAKEN DURING THE UPPER ENDOSCOPY:  Eat a soft diet  IF YOU HAVE NAUSEA, BLOATING, ABDOMINAL PAIN, OR VOMITING.    FINDING OUT THE RESULTS OF YOUR TEST Not all test results are available during your visit. DR. Oneida Alar WILL CALL YOU WITHIN 7 DAYS OF YOUR PROCEDUE WITH YOUR RESULTS. Do not assume everything is normal if you have not heard from DR. Amandeep Hogston IN ONE WEEK, CALL HER OFFICE AT [phone].  SEEK IMMEDIATE MEDICAL ATTENTION AND CALL THE OFFICE: [phone] IF:  You have more than a spotting of blood in your stool.   Your belly is swollen (abdominal distention).   You are nauseated or vomiting.   You have a temperature over 101F.   You have abdominal pain or discomfort that is severe or gets worse throughout the day.   Gastritis/DUODENITIS  Gastritis is an inflammation (the body's way of reacting to injury and/or infection) of the stomach. DUODENITIS is an inflammation (the body's way of reacting to injury and/or infection) of the FIRST PART OF THE SMALL INTESTINES. It is often caused by bacterial (germ) infections. It can also be caused BY ASPIRIN, BC/GOODY POWDER'S, (IBUPROFEN) MOTRIN, OR ALEVE (NAPROXEN), chemicals (including alcohol), SPICY FOODS, and medications. This illness may be associated with generalized malaise (feeling tired, not well), UPPER ABDOMINAL STOMACH cramps, and fever. One common bacterial cause of gastritis is an organism known as H. Pylori. This can be treated with antibiotics.     REFLUX  TREATMENT There are a number of medicines used to treat reflux including: Antacids.  ZANTAC Proton-pump inhibitors: OMEPRAZOLE  HOME CARE INSTRUCTIONS Eat 2-3 hours before going to bed.  Try to reach and maintain a healthy weight. LOSE 10-20 LBS Do not eat just a few very large meals. Instead, eat 4 TO 6 smaller meals throughout the day.  Avoid tight clothing.  Do not exercise right after eating.   Low-Fat Diet BREADS, CEREALS, PASTA, RICE, DRIED PEAS, AND BEANS These products are high in carbohydrates and  most are low in fat. Therefore, they can be increased in the diet as substitutes for fatty foods. They too, however, contain calories and should not be eaten in excess. Cereals can be eaten for snacks as well as for breakfast.  Include foods that contain fiber (fruits, vegetables, whole grains, and legumes). Research shows that fiber may lower blood cholesterol levels, especially the water-soluble fiber found in fruits, vegetables, oat products, and legumes. FRUITS AND VEGETABLES It is good to eat fruits and vegetables. Besides being sources of fiber, both are rich in vitamins and some minerals. They help you get the daily allowances of these nutrients. Fruits and vegetables can be used for snacks and desserts. MEATS Limit lean meat, chicken, [place], and fish to no more than 6 ounces per day. Beef, Pork, and Lamb Use lean cuts of beef, pork, and lamb. Lean cuts include:  Extra-lean ground beef.  Arm roast.  Sirloin tip.  Center-cut ham.  Round steak.  Loin chops.  Rump roast.  Tenderloin.  Trim all fat off the outside of meats before cooking. It is not necessary to severely decrease the intake of red meat, but lean choices should be made. Lean meat is rich in protein and contains a highly absorbable form of iron. Premenopausal women, in particular, should avoid reducing lean red meat because this could increase the risk for low red blood cells (iron-deficiency anemia).  Chicken and [place] These are good sources of protein. The fat of poultry can be reduced by removing the skin and underlying fat layers before cooking. Chicken and [place] can be substituted for lean red meat in the diet. Poultry should not be fried or covered with high-fat sauces. Fish and Shellfish Fish is a good source of protein. Shellfish contain cholesterol, but they usually are low in saturated fatty acids. The preparation of fish is important. Like chicken and [place], they should not be fried or covered with high-fat  sauces. EGGS Egg whites contain no fat or cholesterol. They can be eaten often. Try 1 to 2 egg whites instead of whole eggs in recipes or use egg substitutes that do not contain yolk.  MILK AND DAIRY PRODUCTS Use skim or 1% milk instead of 2% or whole milk. Decrease whole milk, natural, and processed cheeses. Use nonfat or low-fat (2%) cottage cheese or low-fat cheeses made from vegetable oils. Choose nonfat or low-fat (1 to 2%) yogurt. Experiment with evaporated skim milk in recipes that call for heavy cream. Substitute low-fat yogurt or low-fat cottage cheese for sour cream in dips and salad dressings. Have at least 2 servings of low-fat dairy products, such as 2 glasses of skim (or 1%) milk each day to help get your daily calcium intake.  FATS AND OILS Butterfat, lard, and beef fats are high in saturated fat and cholesterol. These should be avoided.Vegetable fats do not contain cholesterol. AVOID coconut oil, palm oil, and palm kernel oil, WHICH are very high in saturated fats. These should be  limited. These fats are often used in bakery goods, processed foods, popcorn, oils, and nondairy creamers. Vegetable shortenings and some peanut butters contain hydrogenated oils, which are also saturated fats. Read the labels on these foods and check for saturated vegetable oils.  Desirable liquid vegetable oils are corn oil, cottonseed oil, olive oil, canola oil, safflower oil, soybean oil, and sunflower oil. Peanut oil is not as good, but small amounts are acceptable. Buy a heart-healthy tub margarine that has no partially hydrogenated oils in the ingredients. AVOID Mayonnaise and salad dressings often are made from unsaturated fats.  OTHER EATING TIPS Snacks  Most sweets should be limited as snacks. They tend to be rich in calories and fats, and their caloric content outweighs their nutritional value. Some good choices in snacks are graham crackers, melba toast, soda crackers, bagels (no egg), English  muffins, fruits, and vegetables. These snacks are preferable to snack crackers, [place] fries, and chips. Popcorn should be air-popped or cooked in small amounts of liquid vegetable oil.  Desserts Eat fruit, low-fat yogurt, and fruit ices instead of pastries, cake, and cookies. Sherbet, angel food cake, gelatin dessert, frozen low-fat yogurt, or other frozen products that do not contain saturated fat (pure fruit juice bars, frozen ice pops) are also acceptable.   COOKING METHODS Choose those methods that use little or no fat. They include: Poaching.  Braising.  Steaming.  Grilling.  Baking.  Stir-frying.  Broiling.  Microwaving.  Foods can be cooked in a nonstick pan without added fat, or use a nonfat cooking spray in regular cookware. Limit fried foods and avoid frying in saturated fat. Add moisture to lean meats by using water, broth, cooking wines, and other nonfat or low-fat sauces along with the cooking methods mentioned above. Soups and stews should be chilled after cooking. The fat that forms on top after a few hours in the refrigerator should be skimmed off. When preparing meals, avoid using excess salt. Salt can contribute to raising blood pressure in some people.  EATING AWAY FROM HOME Order entres, potatoes, and vegetables without sauces or butter. When meat exceeds the size of a deck of cards (3 to 4 ounces), the rest can be taken home for another meal. Choose vegetable or fruit salads and ask for low-calorie salad dressings to be served on the side. Use dressings sparingly. Limit high-fat toppings, such as bacon, crumbled eggs, cheese, sunflower seeds, and olives. Ask for heart-healthy tub margarine instead of butter.

## 2014-09-04 NOTE — Op Note (Signed)
[facility] [address] [place], [address]   ENDOSCOPY PROCEDURE REPORT  PATIENT: Thomas Ball, Thomas Ball  MR#: [id number] BIRTHDATE: 08/03/1961 , 44  yrs. old GENDER: male  ENDOSCOPIST: Barney Drain, MD REFERRED VO:ZDGUY Peyton Najjar, M.D.  PROCEDURE DATE: 2014-09-20 PROCEDURE:   EGD w/ biopsy  INDICATIONS:screening for varices. USES IBUPROFEN MEDICATIONS: Demerol 75 mg IV and Versed 6 mg IV TOPICAL ANESTHETIC:   Viscous Xylocaine ASA CLASS:  DESCRIPTION OF PROCEDURE:     Physical exam was performed.  Informed consent was obtained from the patient after explaining the benefits, risks, and alternatives to the procedure.  The patient was connected to the monitor and placed in the left lateral position.  Continuous oxygen was provided by nasal cannula and IV medicine administered through an indwelling cannula.  After administration of sedation, the patients esophagus was intubated and the EG-[id number] ([id number])  endoscope was advanced under direct visualization to the second portion of the duodenum.  The scope was removed slowly by carefully examining the color, texture, anatomy, and integrity of the mucosa on the way out.  The patient was recovered in endoscopy and discharged home in satisfactory condition.   ESOPHAGUS: Few linear erosions in distal esophagus.  No Barrett's. STOMACH: Mild non-erosive gastritis (inflammation) was found in the gastric antrum.  Multiple biopsies were performed using cold forceps.   DUODENUM: Mild duodenal inflammation was found in the duodenal bulb.   The duodenal mucosa showed no abnormalities in the 2nd part of the duodenum. COMPLICATIONS: There were no immediate complications.  ENDOSCOPIC IMPRESSION: 1.   EROSIVE ESOPHAGITIS, MILD 2.   MILD Non-erosive gastritis/DUODENITIS  RECOMMENDATIONS: NO VARICES IN CHILD PUGH A PT, REPEAT EGD IN 3 YRS. BID PPI AWAIT BIOPSY LOW FAT DIET LOSE WEIGHT OPV MAR 2016  REPEAT  EXAM: _______________________________ eSignedBarney Drain, MD 09-20-2014 5:05 PM     CPT CODES: ICD CODES:  The ICD and CPT codes recommended by this software are interpretations from the data that the clinical staff has captured with the software.  The verification of the translation of this report to the ICD and CPT codes and modifiers is the sole responsibility of the health care institution and practicing physician where this report was generated.  [employer]. will not be held responsible for the validity of the ICD and CPT codes included on this report.  AMA assumes no liability for data contained or not contained herein. CPT is a Designer, television/film set of the [employer].

## 2014-09-07 ENCOUNTER — Encounter (HOSPITAL_COMMUNITY): Payer: Self-pay | Admitting: Gastroenterology

## 2014-09-07 MED ORDER — OMEPRAZOLE 20 MG PO CPDR: DELAYED_RELEASE_CAPSULE | ORAL | Status: DC

## 2014-09-07 NOTE — Addendum Note (Signed)
Addended by: Danie Binder on: 09/07/2014 08:47 AM   Modules accepted: Orders

## 2014-09-24 ENCOUNTER — Telehealth: Payer: Self-pay | Admitting: Gastroenterology

## 2014-09-24 NOTE — Telephone Encounter (Signed)
ON RECALL LIST  °

## 2014-09-24 NOTE — Telephone Encounter (Signed)
Please call pt. His stomach Bx shows gastritis DUE TO IBUPROFEN. HIS U./S SHOWED FATTY LIVER AND MILD CIRRHOSIS. HIS TUMOR MARKER WAS REMAINS ESSENTIALLY UNCHANGED.   CONTINUE YOUR WEIGHT LOSS EFFORTS. Lose 10-20 lbs.  CONTINUE OMEPRAZOLE.  TAKE 30 MINUTES PRIOR TO YOUR MEALS TWICE DAILY FOR 3 MOS THEN ONCE DAILY FOREVER.  FOLLOW A LOW FAT DIET.   FOLLOW UP MAR 2016 E30 CIRRHOSIS. PT NEEDS AFP/CBC/CMP/PT/INR 1 WEEK PRIOR TO VISIT. NEXT EGD IN 3 YEARS.

## 2014-09-28 ENCOUNTER — Other Ambulatory Visit: Payer: Self-pay

## 2014-09-28 DIAGNOSIS — K7460 Unspecified cirrhosis of liver: Secondary | ICD-10-CM

## 2014-09-28 NOTE — Telephone Encounter (Signed)
Pt is aware and labs are on order for March 2016.

## 2014-09-28 NOTE — Telephone Encounter (Signed)
Reminder in epic °

## 2014-10-26 ENCOUNTER — Encounter: Payer: Self-pay | Admitting: Gastroenterology

## 2014-11-11 ENCOUNTER — Telehealth: Payer: Self-pay | Admitting: Gastroenterology

## 2014-11-11 NOTE — Telephone Encounter (Signed)
Patient is aware of OV on 3/16 at 11 with SF and needs to have AFP/CBC/CMP/PT/INR one week prior to OV

## 2014-11-13 ENCOUNTER — Other Ambulatory Visit: Payer: Self-pay

## 2014-11-13 DIAGNOSIS — K7460 Unspecified cirrhosis of liver: Secondary | ICD-10-CM

## 2014-11-13 NOTE — Telephone Encounter (Signed)
Lab orders mailed to pt.

## 2014-12-09 ENCOUNTER — Encounter: Payer: Self-pay | Admitting: Gastroenterology

## 2014-12-09 ENCOUNTER — Telehealth: Payer: Self-pay | Admitting: Gastroenterology

## 2014-12-09 ENCOUNTER — Encounter (INDEPENDENT_AMBULATORY_CARE_PROVIDER_SITE_OTHER): Payer: Self-pay | Admitting: Gastroenterology

## 2014-12-09 NOTE — Telephone Encounter (Signed)
PATIENT WAS A NO SHOW 12/09/14 AND LETTER WAS SENT °

## 2014-12-09 NOTE — Progress Notes (Signed)
   Subjective:    Patient ID: Thomas Ball, male    DOB: 10-07-1960, 54 y.o.   MRN: [id number]  HPI   Past Medical History  Diagnosis Date  . GERD (gastroesophageal reflux disease)   . HCV (hepatitis C virus) DR. ZACKS    AUG 2013 VIRAL LOAD UNDETECTABLE  . Skin cancer 1992    removed from side of head  . Pancreatitis, gallstone   . SVT (supraventricular tachycardia)     Review of Systems     Objective:   Physical Exam        Assessment & Plan:

## 2014-12-09 NOTE — Telephone Encounter (Signed)
REVIEWED-NO ADDITIONAL RECOMMENDATIONS. 

## 2014-12-13 ENCOUNTER — Emergency Department (HOSPITAL_COMMUNITY)
Admission: EM | Admit: 2014-12-13 | Discharge: 2014-12-13 | Disposition: A | Discharge status: Home / Self Care | Payer: Medicaid Other | Attending: Emergency Medicine | Admitting: Emergency Medicine

## 2014-12-13 ENCOUNTER — Encounter (HOSPITAL_COMMUNITY): Payer: Self-pay | Admitting: *Deleted

## 2014-12-13 ENCOUNTER — Emergency Department (HOSPITAL_COMMUNITY): Payer: Medicaid Other

## 2014-12-13 DIAGNOSIS — K219 Gastro-esophageal reflux disease without esophagitis: Secondary | ICD-10-CM | POA: Insufficient documentation

## 2014-12-13 DIAGNOSIS — R197 Diarrhea, unspecified: Secondary | ICD-10-CM | POA: Insufficient documentation

## 2014-12-13 DIAGNOSIS — Z720 Tobacco use: Secondary | ICD-10-CM | POA: Insufficient documentation

## 2014-12-13 DIAGNOSIS — Z85828 Personal history of other malignant neoplasm of skin: Secondary | ICD-10-CM | POA: Insufficient documentation

## 2014-12-13 DIAGNOSIS — R059 Cough, unspecified: Secondary | ICD-10-CM

## 2014-12-13 DIAGNOSIS — R6889 Other general symptoms and signs: Secondary | ICD-10-CM

## 2014-12-13 DIAGNOSIS — R52 Pain, unspecified: Secondary | ICD-10-CM | POA: Insufficient documentation

## 2014-12-13 DIAGNOSIS — R05 Cough: Secondary | ICD-10-CM | POA: Insufficient documentation

## 2014-12-13 DIAGNOSIS — R110 Nausea: Secondary | ICD-10-CM | POA: Insufficient documentation

## 2014-12-13 DIAGNOSIS — Z79899 Other long term (current) drug therapy: Secondary | ICD-10-CM | POA: Insufficient documentation

## 2014-12-13 MED ORDER — ALBUTEROL SULFATE HFA 108 (90 BASE) MCG/ACT IN AERS
1.0000 | INHALATION_SPRAY | Freq: Four times a day (QID) | RESPIRATORY_TRACT | Status: DC
Start: 2014-12-13 — End: 2014-12-13
  Administered 2014-12-13: 2 via RESPIRATORY_TRACT
  Filled 2014-12-13: qty 6.7

## 2014-12-13 MED ORDER — DM-GUAIFENESIN ER 30-600 MG PO TB12: 1.0000 | ORAL_TABLET | Freq: Two times a day (BID) | ORAL | Status: DC

## 2014-12-13 NOTE — ED Provider Notes (Signed)
CSN: [id number]     Arrival date & time 12/13/14  0431 History   First MD Initiated Contact with Patient 12/13/14 [phone]     Chief Complaint  Patient presents with  . Generalized Body Aches  . Diarrhea     (Consider location/radiation/quality/duration/timing/severity/associated sxs/prior Treatment) Patient is a 54 y.o. male presenting with diarrhea. The history is provided by the patient.  Diarrhea Associated symptoms: fever and myalgias   Associated symptoms: no abdominal pain and no headaches    patient with four-day history of flulike symptoms. Bodyaches cough diarrhea mild sore throat. 2 loose bowel movements today to yesterday. Some nausea no real vomiting. Wife has very similar symptoms. Did not have the flu shot. States that he felt like he had fevers at home as well.  Past Medical History  Diagnosis Date  . GERD (gastroesophageal reflux disease)   . HCV (hepatitis C virus) DR. ZACKS    AUG 2013 VIRAL LOAD UNDETECTABLE  . Skin cancer 1992    removed from side of head  . Pancreatitis, gallstone   . SVT (supraventricular tachycardia)    Past Surgical History  Procedure Laterality Date  . Jaw broken    . Cholecystectomy    . Lipoma removals      SEVERAL  . Colonoscopy  10/04/2012    SLF:ONE/8 SIMPLE ADENOMA, TICS, SML IH  . 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 Supraventricular tachycardia ablation N/A 06/22/2014    Procedure: SUPRAVENTRICULAR TACHYCARDIA ABLATION;  Surgeon: Evans Lance, MD;  Location: [facility] CATH LAB;  Service: Cardiovascular;  Laterality: N/A;  . Esophagogastroduodenoscopy N/A 09/04/2014    Procedure: ESOPHAGOGASTRODUODENOSCOPY (EGD);  Surgeon: Danie Binder, MD;  Location: [facility] ENDO SUITE;  Service: Endoscopy;  Laterality: N/A;  145   Family History  Problem Relation Age of Onset  . Colon cancer Neg Hx   . Emphysema Mother   . Hyperlipidemia Father   . Stroke Father   . Diabetes Father    History  Substance Use Topics  . 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 Smokeless tobacco: Never Used     Comment: had quit for 9 mos, picked up again 2 mos ago  . Alcohol Use: No    Review of Systems  Constitutional: Positive for fever.  HENT: Positive for congestion.   Eyes: Negative for visual disturbance.  Respiratory: Positive for cough.   Cardiovascular: Negative for chest pain.  Gastrointestinal: Positive for nausea and diarrhea. Negative for abdominal pain.  Genitourinary: Negative for dysuria.  Musculoskeletal: Positive for myalgias.  Skin: Negative for rash.  Neurological: Negative for headaches.  Hematological: Does not bruise/bleed easily.  Psychiatric/Behavioral: Negative for confusion.      Allergies  Review of patient's allergies indicates no known allergies.  Home Medications   Prior to Admission medications   Medication Sig Start Date End Date Taking? Authorizing Provider  omeprazole (PRILOSEC) 20 MG capsule 1 po bid 30 minutes before meals for 3 mos then once daily FOREVER 09/07/14  Yes Danie Binder, MD  dextromethorphan-guaiFENesin [facility] DM) 30-600 MG per 12 hr tablet Take 1 tablet by mouth 2 (two) times daily. 12/13/14   Fredia Sorrow, MD  ibuprofen (ADVIL,MOTRIN) 200 MG tablet Take 400-600 mg by mouth every 6 (six) hours as needed for headache.    Historical Provider, MD   BP 118/79 mmHg  Pulse 77  Temp(Src) 97.8 F (36.6 C) (  Oral)  Resp 20  Ht 5\' 5"  (1.651 m)  Wt 262 lb (118.842 kg)  BMI 43.60 kg/m2  SpO2 96% Physical Exam  Constitutional: He is oriented to person, place, and time. He appears well-developed and well-nourished. No distress.  HENT:  Head: Normocephalic and atraumatic.  Mouth/Throat: Oropharynx is clear and moist.  Eyes: Conjunctivae and EOM are normal. Pupils are equal, round, and reactive to light.  Neck: Normal range of motion. Neck supple.  Cardiovascular: Normal rate, regular rhythm and normal heart  sounds.   Pulmonary/Chest: Effort normal and breath sounds normal. No respiratory distress. He has no wheezes. He has no rales.  Abdominal: Soft. Bowel sounds are normal. There is no tenderness.  Musculoskeletal: Normal range of motion.  Neurological: He is alert and oriented to person, place, and time. No cranial nerve deficit. He exhibits normal muscle tone. Coordination normal.  Skin: Skin is warm. No rash noted.  Nursing note and vitals reviewed.   ED Course  Procedures (including critical care time) Labs Review Labs Reviewed - No data to display  Imaging Review Dg Chest 2 View  12/13/2014   CLINICAL DATA:  Cough congestion and wheezing for 2 weeks  EXAM: CHEST  2 VIEW  COMPARISON:  07/30/2014  FINDINGS: The lungs are clear except for minor granulomatous changes and minimal linear basilar scarring. Heart size is normal and unchanged. There are no effusions. Pulmonary vasculature is normal. Hilar and mediastinal contours are normal.  IMPRESSION: No active cardiopulmonary disease.   Electronically Signed   By: Andreas Newport M.D.   On: 12/13/2014 06:45     EKG Interpretation None      MDM   Final diagnoses:  Cough  Flu-like symptoms    Patient nontoxic no acute distress. Patient's symptoms seem to be consistent with a flulike illness. Patient without significant fever no hypoxia. Chest x-rays negative for pneumonia. Will be treated symptomatically. Patient is stable for discharge home.    Fredia Sorrow, MD 12/13/14 [phone]

## 2014-12-13 NOTE — ED Notes (Signed)
Pt reports cough, body aches, sore throat & diarrhea that started 3 days ago. Says chest tight pt wife sick last week w/ the same.

## 2014-12-13 NOTE — Discharge Instructions (Signed)
Use albuterol inhaler 2 puffs every 6 hours for the next 7 days. This will help keep your lungs open help suppress the cough. Also take Mucinex DM this is also available over-the-counter this will help loosen up the mucus and will help suppress the cough. Return for any new or worse symptoms. Symptoms seem to be consistent with the flu.

## 2015-03-15 ENCOUNTER — Encounter (HOSPITAL_COMMUNITY): Payer: Self-pay | Admitting: *Deleted

## 2015-03-15 ENCOUNTER — Emergency Department (HOSPITAL_COMMUNITY): Payer: Medicaid Other

## 2015-03-15 ENCOUNTER — Emergency Department (HOSPITAL_COMMUNITY)
Admission: EM | Admit: 2015-03-15 | Discharge: 2015-03-15 | Disposition: A | Discharge status: Home / Self Care | Payer: Medicaid Other | Attending: Emergency Medicine | Admitting: Emergency Medicine

## 2015-03-15 DIAGNOSIS — Z720 Tobacco use: Secondary | ICD-10-CM | POA: Insufficient documentation

## 2015-03-15 DIAGNOSIS — K219 Gastro-esophageal reflux disease without esophagitis: Secondary | ICD-10-CM | POA: Insufficient documentation

## 2015-03-15 DIAGNOSIS — Z85828 Personal history of other malignant neoplasm of skin: Secondary | ICD-10-CM | POA: Insufficient documentation

## 2015-03-15 DIAGNOSIS — Z8719 Personal history of other diseases of the digestive system: Secondary | ICD-10-CM | POA: Insufficient documentation

## 2015-03-15 DIAGNOSIS — I493 Ventricular premature depolarization: Secondary | ICD-10-CM | POA: Insufficient documentation

## 2015-03-15 DIAGNOSIS — Z79899 Other long term (current) drug therapy: Secondary | ICD-10-CM | POA: Insufficient documentation

## 2015-03-15 DIAGNOSIS — Z8619 Personal history of other infectious and parasitic diseases: Secondary | ICD-10-CM | POA: Insufficient documentation

## 2015-03-15 DIAGNOSIS — R002 Palpitations: Secondary | ICD-10-CM

## 2015-03-15 LAB — CBC WITH DIFFERENTIAL/PLATELET
BASOS ABS: 0 10*3/uL (ref 0.0–0.1)
Basophils Relative: 0 % (ref 0–1)
Eosinophils Absolute: 0.1 10*3/uL (ref 0.0–0.7)
Eosinophils Relative: 2 % (ref 0–5)
HCT: 45.8 % (ref 39.0–52.0)
Hemoglobin: 15.9 g/dL (ref 13.0–17.0)
LYMPHS PCT: 19 % (ref 12–46)
Lymphs Abs: 1.1 10*3/uL (ref 0.7–4.0)
MCH: 33.1 pg (ref 26.0–34.0)
MCHC: 34.7 g/dL (ref 30.0–36.0)
MCV: 95.2 fL (ref 78.0–100.0)
MONO ABS: 0.5 10*3/uL (ref 0.1–1.0)
Monocytes Relative: 9 % (ref 3–12)
NEUTROS ABS: 3.8 10*3/uL (ref 1.7–7.7)
NEUTROS PCT: 70 % (ref 43–77)
Platelets: 152 10*3/uL (ref 150–400)
RBC: 4.81 MIL/uL (ref 4.22–5.81)
RDW: 13.1 % (ref 11.5–15.5)
WBC: 5.5 10*3/uL (ref 4.0–10.5)

## 2015-03-15 LAB — COMPREHENSIVE METABOLIC PANEL
ALT: 35 U/L (ref 17–63)
AST: 24 U/L (ref 15–41)
Albumin: 3.8 g/dL (ref 3.5–5.0)
Alkaline Phosphatase: 19 U/L — ABNORMAL LOW (ref 38–126)
Anion gap: 6 (ref 5–15)
BUN: 15 mg/dL (ref 6–20)
CHLORIDE: 105 mmol/L (ref 101–111)
CO2: 27 mmol/L (ref 22–32)
CREATININE: 0.84 mg/dL (ref 0.61–1.24)
Calcium: 8.7 mg/dL — ABNORMAL LOW (ref 8.9–10.3)
GFR calc Af Amer: >60 mL/min (ref >60)
Glucose, Bld: 116 mg/dL — ABNORMAL HIGH (ref 65–99)
Potassium: 4.2 mmol/L (ref 3.5–5.1)
Sodium: 138 mmol/L (ref 135–145)
Total Bilirubin: 0.8 mg/dL (ref 0.3–1.2)
Total Protein: 6.8 g/dL (ref 6.5–8.1)

## 2015-03-15 LAB — TROPONIN I: Troponin I: <0.03 ng/mL (ref <0.031)

## 2015-03-15 LAB — MAGNESIUM: MAGNESIUM: 2.2 mg/dL (ref 1.7–2.4)

## 2015-03-15 NOTE — ED Notes (Signed)
Pulse ox 95-96% on room air while ambulating.  Pt no distress.

## 2015-03-15 NOTE — ED Notes (Signed)
Pt c/o left sided chest pain, described as tightness that started a little over a week ago

## 2015-03-15 NOTE — Discharge Instructions (Signed)
Your tests today were good. Your monitor showed some skipped beats called PVC's. You have had them before on your old EKG's. Call Dr Tanna Furry office (he's the one who did your ablation) to let him know you are having some abnormal heart beating and he will probably see you in the office soon. Return to the ED if you get chest pain, feel light headed, dizzy, short of breath or feel your heart is beating abnormal for more than 10-20 minutes constantly.    Premature Ventricular Contraction Premature ventricular contraction (PVC) is an irregularity of the heart rhythm involving extra or skipped heartbeats. In some cases, they may occur without obvious cause or heart disease. Other times, they can be caused by an electrolyte change in the blood. These need to be corrected. They can also be seen when there is not enough oxygen going to the heart. A common cause of this is plaque or cholesterol buildup. This buildup decreases the blood supply to the heart. In addition, extra beats may be caused or aggravated by:  Excessive smoking.  Alcohol consumption.  Caffeine.  Certain medications  Some street drugs. SYMPTOMS   The sensation of feeling your heart skipping a beat (palpitations).  In many cases, the person may have no symptoms. SIGNS AND TESTS   A physical examination may show an occasional irregularity, but if the PVC beats do not happen often, they may not be found on physical exam.  Blood pressure is usually normal.  Other tests that may find extra beats of the heart are:  An EKG (electrocardiogram)  A Holter monitor which can monitor your heart over longer periods of time  An Angiogram (study of the heart arteries). TREATMENT  Usually extra heartbeats do not need treatment. The condition is treated only if symptoms are severe or if extra beats are very frequent or are causing problems. An underlying cause, if discovered, may also require treatment.  Treatment may also be needed if  there may be a risk for other more serious cardiac arrhythmias.  PREVENTION   Moderation in caffeine, alcohol, and tobacco use may reduce the risk of ectopic heartbeats in some people.  Exercise often helps people who lead a sedentary (inactive) lifestyle. PROGNOSIS  PVC heartbeats are generally harmless and do not need treatment.  RISKS AND COMPLICATIONS   Ventricular tachycardia (occasionally).  There usually are no complications.  Other arrhythmias (occasionally). SEEK IMMEDIATE MEDICAL CARE IF:   You feel palpitations that are frequent or continual.  You develop chest pain or other problems such as shortness of breath, sweating, or nausea and vomiting.  You become light-headed or faint (pass out).  You get worse or do not improve with treatment. Document Released: 04/28/2004 Document Revised: 12/04/2011 Document Reviewed: 11/08/2007 [facility] Patient Information 2015 [place], [place]. This information is not intended to replace advice given to you by your health care provider. Make sure you discuss any questions you have with your health care provider.

## 2015-03-15 NOTE — ED Provider Notes (Signed)
CSN: [id number]     Arrival date & time 03/15/15  0506 History   First MD Initiated Contact with Patient 03/15/15 0518     Chief Complaint  Patient presents with  . Chest Pain     (Consider location/radiation/quality/duration/timing/severity/associated sxs/prior Treatment) HPI  Patient states he feels like his heart is beating hard and on over the past week. He states sometimes it's a single blood. He states sometimes it beats hard for less than a minute. He states he feels short of breath sometimes but not when he feels the heart beating differently. States he woke up at 4 AM and his chest feels tight today. He states he has a chronic cough that's not changed. He denies no change in his sputum production. He denies fever. He denies nausea, vomiting, diarrhea. He denies sore throat, rhinorrhea or wheezing. Patient is hard to get to clarify how he is feeling with his cardiac symptoms.  Patient had a cardiac ablation done at [facility] when he was having SVT. The procedure was done by Dr Lovena Le who also saw him in follow up.   PCP none GI Dr Oneida Alar  Past Medical History  Diagnosis Date  . GERD (gastroesophageal reflux disease)   . HCV (hepatitis C virus) DR. ZACKS    AUG 2013 VIRAL LOAD UNDETECTABLE  . Skin cancer 1992    removed from side of head  . Pancreatitis, gallstone   . SVT (supraventricular tachycardia)    Past Surgical History  Procedure Laterality Date  . Jaw broken    . Cholecystectomy    . Lipoma removals      SEVERAL  . Colonoscopy  10/04/2012    SLF:ONE/8 SIMPLE ADENOMA, TICS, SML IH  . 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 Supraventricular tachycardia ablation N/A 06/22/2014    Procedure: SUPRAVENTRICULAR TACHYCARDIA ABLATION;  Surgeon: Evans Lance, MD;  Location: [facility] CATH LAB;  Service: Cardiovascular;  Laterality: N/A;  . Esophagogastroduodenoscopy N/A 09/04/2014    Procedure: ESOPHAGOGASTRODUODENOSCOPY (EGD);  Surgeon: Danie Binder, MD;   Location: [facility] ENDO [facility];  Service: Endoscopy;  Laterality: N/A;  145   Family History  Problem Relation Age of Onset  . Colon cancer Neg Hx   . Emphysema Mother   . Hyperlipidemia Father   . Stroke Father   . Diabetes Father    History  Substance Use Topics  . 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 Smokeless tobacco: Never Used     Comment: had quit for 9 mos, picked up again 2 mos ago  . Alcohol Use: No  employed  Review of Systems  All other systems reviewed and are negative.     Allergies  Review of patient's allergies indicates no known allergies.  Home Medications   Prior to Admission medications   Medication Sig Start Date End Date Taking? Authorizing Provider  dextromethorphan-guaiFENesin (MUCINEX DM) 30-600 MG per 12 hr tablet Take 1 tablet by mouth 2 (two) times daily. 12/13/14  Yes Fredia Sorrow, MD  omeprazole (PRILOSEC) 20 MG capsule 1 po bid 30 minutes before meals for 3 mos then once daily FOREVER 09/07/14  Yes Danie Binder, MD   BP 123/93 mmHg  Pulse 69  Temp(Src) 98.2 F (36.8 C) (Oral)  Resp 14  Ht 5\' 5"  (1.651 m)  Wt 250 lb (113.399 kg)  BMI 41.60 kg/m2  SpO2 96%  Vital signs normal  Physical Exam  Constitutional: He is oriented to person, place, and time. He appears well-developed and well-nourished.  Non-toxic appearance. He does not appear ill. No distress.  HENT:  Head: Normocephalic and atraumatic.  Right Ear: External ear normal.  Left Ear: External ear normal.  Nose: Nose normal. No mucosal edema or rhinorrhea.  Mouth/Throat: Oropharynx is clear and moist and mucous membranes are normal. No dental abscesses or uvula swelling.  Eyes: Conjunctivae and EOM are normal. Pupils are equal, round, and reactive to light.  Neck: Normal range of motion and full passive range of motion without pain. Neck supple.  Cardiovascular: Normal rate, regular rhythm and normal heart sounds.  Exam  reveals no gallop and no friction rub.   No murmur heard. Pulmonary/Chest: Effort normal and breath sounds normal. No respiratory distress. He has no wheezes. He has no rhonchi. He has no rales. He exhibits no tenderness and no crepitus.  Abdominal: Soft. Normal appearance and bowel sounds are normal. He exhibits no distension. There is no tenderness. There is no rebound and no guarding.  Musculoskeletal: Normal range of motion. He exhibits no edema or tenderness.  Moves all extremities well.   Neurological: He is alert and oriented to person, place, and time. He has normal strength. No cranial nerve deficit.  Skin: Skin is warm, dry and intact. No rash noted. No erythema. No pallor.  Psychiatric: He has a normal mood and affect. His speech is normal and behavior is normal. His mood appears not anxious.  Nursing note and vitals reviewed.   ED Course  Procedures (including critical care time)  Medications - No data to display   Recheck at 6:40 AM. Patient given his test results letter back. He states he's feeling better. When I review his monitor he is noted to have isolated PVCs. He had about 3 PVCs while was in the room talking to him, however he was not aware of them. He states he has not had any more of the palpitations like he had at home.  Patient ambulated by nursing staff. His pulse ox remained 95-96%. He states he felt fine and did not feel worse.  Labs Review Results for orders placed or performed during the hospital encounter of 03/15/15  CBC with Differential  Result Value Ref Range   WBC 5.5 4.0 - 10.5 K/uL   RBC 4.81 4.22 - 5.81 MIL/uL   Hemoglobin 15.9 13.0 - 17.0 g/dL   HCT 45.8 39.0 - 52.0 %   MCV 95.2 78.0 - 100.0 fL   MCH 33.1 26.0 - 34.0 pg   MCHC 34.7 30.0 - 36.0 g/dL   RDW 13.1 11.5 - 15.5 %   Platelets 152 150 - 400 K/uL   Neutrophils Relative % 70 43 - 77 %   Neutro Abs 3.8 1.7 - 7.7 K/uL   Lymphocytes Relative 19 12 - 46 %   Lymphs Abs 1.1 0.7 - 4.0 K/uL    Monocytes Relative 9 3 - 12 %   Monocytes Absolute 0.5 0.1 - 1.0 K/uL   Eosinophils Relative 2 0 - 5 %   Eosinophils Absolute 0.1 0.0 - 0.7 K/uL   Basophils Relative 0 0 - 1 %   Basophils Absolute 0.0 0.0 - 0.1 K/uL  Comprehensive metabolic panel  Result Value Ref Range   Sodium 138 135 - 145 mmol/L   Potassium 4.2 3.5 - 5.1 mmol/L   Chloride 105 101 - 111 mmol/L   CO2 27 22 - 32 mmol/L   Glucose,  Bld 116 (H) 65 - 99 mg/dL   BUN 15 6 - 20 mg/dL   Creatinine, Ser 0.84 0.61 - 1.24 mg/dL   Calcium 8.7 (L) 8.9 - 10.3 mg/dL   Total Protein 6.8 6.5 - 8.1 g/dL   Albumin 3.8 3.5 - 5.0 g/dL   AST 24 15 - 41 U/L   ALT 35 17 - 63 U/L   Alkaline Phosphatase 19 (L) 38 - 126 U/L   Total Bilirubin 0.8 0.3 - 1.2 mg/dL   GFR calc non Af Amer >60 >60 mL/min   GFR calc Af Amer >60 >60 mL/min   Anion gap 6 5 - 15  Troponin I  Result Value Ref Range   Troponin I <0.03 <0.031 ng/mL  Magnesium  Result Value Ref Range   Magnesium 2.2 1.7 - 2.4 mg/dL   Laboratory interpretation all normal       Imaging Review Dg Chest Portable 1 View  03/15/2015   CLINICAL DATA:  Acute onset of left-sided chest pain and tightness. Initial encounter.  EXAM: PORTABLE CHEST - 1 VIEW  COMPARISON:  Chest radiograph performed 12/13/2014  FINDINGS: The lungs are well-aerated. Mild vascular congestion is noted, with mild bibasilar atelectasis. There is no evidence of pleural effusion or pneumothorax.  The cardiomediastinal silhouette is borderline normal in size. No acute osseous abnormalities are seen.  IMPRESSION: Mild vascular congestion, with mild bibasilar atelectasis.   Electronically Signed   By: Garald Balding M.D.   On: 03/15/2015 06:12     EKG Interpretation   Date/Time:  Monday March 15 2015 05:18:18 EDT Ventricular Rate:  70 PR Interval:  133 QRS Duration: 90 QT Interval:  381 QTC Calculation: 411 R Axis:   30 Text Interpretation:  Sinus rhythm Normal ECG Since last tracing 23 Jun 2014 Premature  ventricular complexes NO LONGER PRESENT Confirmed by Colleen Donahoe   MD-I, Niamya Vittitow ([id number]) on 03/15/2015 6:01:33 AM      MDM   Final diagnoses:  Palpitations  PVC's (premature ventricular contractions)    Plan discharge  Rolland Porter, MD, Barbette Or, MD 03/15/15 [phone]

## 2015-03-17 ENCOUNTER — Telehealth: Payer: Self-pay | Admitting: Internal Medicine

## 2015-03-17 NOTE — Telephone Encounter (Signed)
Patient seen in ED on 03/15/15 and discharged

## 2015-03-17 NOTE — Telephone Encounter (Signed)
Left message 6/20 on voice mail stating that he was recently in [place] and was told to contact our office. Stated that he was not feeling well

## 2015-05-13 ENCOUNTER — Telehealth: Payer: Self-pay | Admitting: Gastroenterology

## 2015-05-13 NOTE — Telephone Encounter (Signed)
ADD FASTING LIPID PANEL AND HEMOGLOBIN A1C, Dx FATTY LIVER.

## 2015-05-13 NOTE — Telephone Encounter (Signed)
Routing to Dr. Fields.  

## 2015-05-13 NOTE — Telephone Encounter (Signed)
Patient called again today to let us know that he is reapplying for [facility] Assistance and wanted to know if he could have his cholesterol and diabetes labs added to whatever labs he was ordered to have by [place].  Please advise and call him back at [phone]

## 2015-05-14 ENCOUNTER — Other Ambulatory Visit: Payer: Self-pay

## 2015-05-14 DIAGNOSIS — K760 Fatty (change of) liver, not elsewhere classified: Secondary | ICD-10-CM

## 2015-05-14 NOTE — Telephone Encounter (Signed)
LMOM that the orders have been faxed to [facility].

## 2015-06-11 ENCOUNTER — Other Ambulatory Visit: Payer: Self-pay

## 2015-06-11 ENCOUNTER — Other Ambulatory Visit: Payer: Self-pay | Admitting: Gastroenterology

## 2015-06-11 DIAGNOSIS — K7469 Other cirrhosis of liver: Secondary | ICD-10-CM

## 2015-06-11 NOTE — Addendum Note (Signed)
Addended by: Claudina Lick on: 06/11/2015 08:35 AM   Modules accepted: Orders

## 2015-06-11 NOTE — Telephone Encounter (Signed)
[facility] called this am. Pt was there for labs but he was not fasting and wanted to know if he could still have the blood work done. Advised them that [facility] had ordered fasting labs to be done. He also asked if there was other blood work that he needed because the pt told him there should be more. I checked, last labs ordered were in February. Lennette Bihari from [place] said he could see those orders but they were expired and would need to be put in again. I have re-ordered what [facility] wanted from 08/2014 phone note. Lennette Bihari will have pt come back fasting to have blood work done.

## 2015-06-12 LAB — CBC WITH DIFFERENTIAL/PLATELET
Basophils Absolute: 0.1 10*3/uL (ref 0.0–0.1)
Basophils Relative: 1 % (ref 0–1)
EOS PCT: 2 % (ref 0–5)
Eosinophils Absolute: 0.1 10*3/uL (ref 0.0–0.7)
HCT: 47.7 % (ref 39.0–52.0)
Hemoglobin: 16.7 g/dL (ref 13.0–17.0)
LYMPHS ABS: 1.6 10*3/uL (ref 0.7–4.0)
Lymphocytes Relative: 24 % (ref 12–46)
MCH: 33.5 pg (ref 26.0–34.0)
MCHC: 35 g/dL (ref 30.0–36.0)
MCV: 95.8 fL (ref 78.0–100.0)
MONOS PCT: 6 % (ref 3–12)
MPV: 11 fL (ref 8.6–12.4)
Monocytes Absolute: 0.4 10*3/uL (ref 0.1–1.0)
Neutro Abs: 4.4 10*3/uL (ref 1.7–7.7)
Neutrophils Relative %: 67 % (ref 43–77)
PLATELETS: 169 10*3/uL (ref 150–400)
RBC: 4.98 MIL/uL (ref 4.22–5.81)
RDW: 13.8 % (ref 11.5–15.5)
WBC: 6.5 10*3/uL (ref 4.0–10.5)

## 2015-06-12 LAB — LIPID PANEL
CHOL/HDL RATIO: 4.9 ratio (ref <=5.0)
Cholesterol: 165 mg/dL (ref 125–200)
HDL: 34 mg/dL — AB (ref >=40)
LDL Cholesterol: 111 mg/dL (ref <130)
Triglycerides: 102 mg/dL (ref <150)
VLDL: 20 mg/dL (ref <30)

## 2015-06-12 LAB — COMPREHENSIVE METABOLIC PANEL
ALT: 29 U/L (ref 9–46)
AST: 17 U/L (ref 10–35)
Albumin: 4.1 g/dL (ref 3.6–5.1)
Alkaline Phosphatase: 16 U/L — ABNORMAL LOW (ref 40–115)
BUN: 17 mg/dL (ref 7–25)
CHLORIDE: 106 mmol/L (ref 98–110)
CO2: 23 mmol/L (ref 20–31)
Calcium: 9.3 mg/dL (ref 8.6–10.3)
Creat: 1.01 mg/dL (ref 0.70–1.33)
GLUCOSE: 104 mg/dL — AB (ref 65–99)
POTASSIUM: 4.6 mmol/L (ref 3.5–5.3)
Sodium: 137 mmol/L (ref 135–146)
TOTAL PROTEIN: 6.8 g/dL (ref 6.1–8.1)
Total Bilirubin: 1 mg/dL (ref 0.2–1.2)

## 2015-06-12 LAB — PROTIME-INR
INR: 1.02 (ref <1.50)
PROTHROMBIN TIME: 13.5 s (ref 11.6–15.2)

## 2015-06-12 LAB — HEMOGLOBIN A1C
Hgb A1c MFr Bld: 5.3 % (ref <5.7)
Mean Plasma Glucose: 105 mg/dL (ref <117)

## 2015-06-14 LAB — AFP TUMOR MARKER: AFP-Tumor Marker: 4.7 ng/mL (ref <6.1)

## 2015-06-16 ENCOUNTER — Encounter: Payer: Self-pay | Admitting: Gastroenterology

## 2015-06-16 ENCOUNTER — Ambulatory Visit (INDEPENDENT_AMBULATORY_CARE_PROVIDER_SITE_OTHER): Payer: Self-pay | Admitting: Gastroenterology

## 2015-06-16 VITALS — BP 142/82 | HR 69 | Temp 98.4°F | Ht 65.0 in | Wt 264.0 lb

## 2015-06-16 DIAGNOSIS — K7469 Other cirrhosis of liver: Secondary | ICD-10-CM

## 2015-06-16 NOTE — Patient Instructions (Signed)
FOLLOW UP WITH [facility]: [phone].  DRINK WATER TO KEEP YOUR URINE LIGHT YELLOW.  CONTINUE YOUR WEIGHT LOSS EFFORTS.  FOLLOW A LOW FAT DIET. SEE INFO BELOW.  COMPLETE LABS AND ULTRASOUND ONE WEEK PRIOR TO YOU NEXT VISIT IN 6 MOS.  Low-Fat Diet BREADS, CEREALS, PASTA, RICE, DRIED PEAS, AND BEANS These products are high in carbohydrates and most are low in fat. Therefore, they can be increased in the diet as substitutes for fatty foods. They too, however, contain calories and should not be eaten in excess. Cereals can be eaten for snacks as well as for breakfast.   FRUITS AND VEGETABLES It is good to eat fruits and vegetables. Besides being sources of fiber, both are rich in vitamins and some minerals. They help you get the daily allowances of these nutrients. Fruits and vegetables can be used for snacks and desserts.  MEATS Limit lean meat, chicken, [place], and fish to no more than 6 ounces per day. Beef, Pork, and Lamb Use lean cuts of beef, pork, and lamb. Lean cuts include:  Extra-lean ground beef.  Arm roast.  Sirloin tip.  Center-cut ham.  Round steak.  Loin chops.  Rump roast.  Tenderloin.  Trim all fat off the outside of meats before cooking. It is not necessary to severely decrease the intake of red meat, but lean choices should be made. Lean meat is rich in protein and contains a highly absorbable form of iron. Premenopausal women, in particular, should avoid reducing lean red meat because this could increase the risk for low red blood cells (iron-deficiency anemia).  Chicken and [place] These are good sources of protein. The fat of poultry can be reduced by removing the skin and underlying fat layers before cooking. Chicken and [place] can be substituted for lean red meat in the diet. Poultry should not be fried or covered with high-fat sauces. Fish and Shellfish Fish is a good source of protein. Shellfish contain cholesterol, but they usually  are low in saturated fatty acids. The preparation of fish is important. Like chicken and [place], they should not be fried or covered with high-fat sauces. EGGS Egg whites contain no fat or cholesterol. They can be eaten often. Try 1 to 2 egg whites instead of whole eggs in recipes or use egg substitutes that do not contain yolk. MILK AND DAIRY PRODUCTS Use skim or 1% milk instead of 2% or whole milk. Decrease whole milk, natural, and processed cheeses. Use nonfat or low-fat (2%) cottage cheese or low-fat cheeses made from vegetable oils. Choose nonfat or low-fat (1 to 2%) yogurt. Experiment with evaporated skim milk in recipes that call for heavy cream. Substitute low-fat yogurt or low-fat cottage cheese for sour cream in dips and salad dressings. Have at least 2 servings of low-fat dairy products, such as 2 glasses of skim (or 1%) milk each day to help get your daily calcium intake. FATS AND OILS Reduce the total intake of fats, especially saturated fat. Butterfat, lard, and beef fats are high in saturated fat and cholesterol. These should be avoided as much as possible. Vegetable fats do not contain cholesterol, but certain vegetable fats, such as coconut oil, palm oil, and palm kernel oil are very high in saturated fats. These should be limited. These fats are often used in bakery goods, processed foods, popcorn, oils, and nondairy creamers. Vegetable shortenings and some peanut butters contain hydrogenated oils, which are also saturated fats. Read the labels on these foods and check for saturated  vegetable oils. Unsaturated vegetable oils and fats do not raise blood cholesterol. However, they should be limited because they are fats and are high in calories. Total fat should still be limited to 30% of your daily caloric intake. Desirable liquid vegetable oils are corn oil, cottonseed oil, olive oil, canola oil, safflower oil, soybean oil, and sunflower oil. Peanut oil is not as good, but small amounts are  acceptable. Buy a heart-healthy tub margarine that has no partially hydrogenated oils in the ingredients. Mayonnaise and salad dressings often are made from unsaturated fats, but they should also be limited because of their high calorie and fat content. Seeds, nuts, peanut butter, olives, and avocados are high in fat, but the fat is mainly the unsaturated type. These foods should be limited mainly to avoid excess calories and fat. OTHER EATING TIPS Snacks  Most sweets should be limited as snacks. They tend to be rich in calories and fats, and their caloric content outweighs their nutritional value. Some good choices in snacks are graham crackers, melba toast, soda crackers, bagels (no egg), English muffins, fruits, and vegetables. These snacks are preferable to snack crackers, [place] fries, TORTILLA CHIPS, and POTATO chips. Popcorn should be air-popped or cooked in small amounts of liquid vegetable oil. Desserts Eat fruit, low-fat yogurt, and fruit ices instead of pastries, cake, and cookies. Sherbet, angel food cake, gelatin dessert, frozen low-fat yogurt, or other frozen products that do not contain saturated fat (pure fruit juice bars, frozen ice pops) are also acceptable.  COOKING METHODS Choose those methods that use little or no fat. They include: Poaching.  Braising.  Steaming.  Grilling.  Baking.  Stir-frying.  Broiling.  Microwaving.  Foods can be cooked in a nonstick pan without added fat, or use a nonfat cooking spray in regular cookware. Limit fried foods and avoid frying in saturated fat. Add moisture to lean meats by using water, broth, cooking wines, and other nonfat or low-fat sauces along with the cooking methods mentioned above. Soups and stews should be chilled after cooking. The fat that forms on top after a few hours in the refrigerator should be skimmed off. When preparing meals, avoid using excess salt. Salt can contribute to raising blood pressure in some people.  EATING  AWAY FROM HOME Order entres, potatoes, and vegetables without sauces or butter. When meat exceeds the size of a deck of cards (3 to 4 ounces), the rest can be taken home for another meal. Choose vegetable or fruit salads and ask for low-calorie salad dressings to be served on the side. Use dressings sparingly. Limit high-fat toppings, such as bacon, crumbled eggs, cheese, sunflower seeds, and olives. Ask for heart-healthy tub margarine instead of butter.

## 2015-06-16 NOTE — Progress Notes (Signed)
ON RECALL  °

## 2015-06-16 NOTE — Progress Notes (Signed)
NO PCP PER PATIENT °

## 2015-06-16 NOTE — Assessment & Plan Note (Addendum)
WELL COMPENSATED DISEASE.   REVIEWED LABS AND U/S. DISCUSSED BENEFITS, RISKS, AND MANAGEMENT OF FATTY LIVER AND CIRRHOSIS. FOLLOW UP WITH [facility]: [phone] FOR BP, GLUCOSE, AND WEIGHT MANAGEMENT. DRINK WATER TO KEEP YOUR URINE LIGHT YELLOW. CONTINUEWEIGHT LOSS EFFORTS. FOLLOW A LOW FAT DIET. SEE INFO BELOW. COMPLETE LABS AND ULTRASOUND ONE WEEK PRIOR TO YOU NEXT VISIT IN 6 MOS.  GREATER THAN 50% WAS SPENT IN COUNSELING & COORDINATION OF CARE WITH THE PATIENT: DISCUSSED DIFFERENTIAL DIAGNOSIS, BENEFITS, RISKS, AND MANAGEMENT OF FATTY LIVER DISEASE. TOTAL ENCOUNTER TIME: 15 MINS.

## 2015-06-16 NOTE — Progress Notes (Signed)
   Subjective:    Patient ID: Thomas Ball, male    DOB: 06/08/1961, 54 y.o.   MRN: [id number]  No PCP Per Patient  HPI Doing well.  BOWELS GOOD. Heartburn when he doesn't take his pills. Wants to find a PCP. PT DENIES FEVER, CHILLS, HEMATOCHEZIA, nausea, vomiting, melena, diarrhea, CHEST PAIN, SHORTNESS OF BREATH,  CHANGE IN BOWEL IN HABITS, constipation, abdominal pain, OR problems swallowing.    Past Medical History  Diagnosis Date  . GERD (gastroesophageal reflux disease)   . HCV (hepatitis C virus) DR. ZACKS    AUG 2013 VIRAL LOAD UNDETECTABLE  . Skin cancer 1992    removed from side of head  . Pancreatitis, gallstone   . SVT (supraventricular tachycardia)    Past Surgical History  Procedure Laterality Date  . Jaw broken    . Cholecystectomy    . Lipoma removals      SEVERAL  . Colonoscopy  10/04/2012    SLF:ONE/8 SIMPLE ADENOMA, TICS, SML IH  . 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 Supraventricular tachycardia ablation N/A 06/22/2014    Procedure: SUPRAVENTRICULAR TACHYCARDIA ABLATION;  Surgeon: Evans Lance, MD;  Location: [facility] CATH LAB;  Service: Cardiovascular;  Laterality: N/A;  . Esophagogastroduodenoscopy N/A 09/04/2014    Procedure: ESOPHAGOGASTRODUODENOSCOPY (EGD);  Surgeon: Danie Binder, MD;  Location: [facility]O [facility];  Service: Endoscopy;  Laterality: N/A;  145   No Known Allergies  Current Outpatient Prescriptions  Medication Sig Dispense Refill  . dextromethorphan-guaiFENesin (MUCINEX DM) 30-600 MG per 12 hr tablet Take 1 tablet by mouth 2 (two) times daily. 14 tablet 0  . omeprazole (PRILOSEC) 20 MG capsule 1 po bid 30 minutes before meals for 3 mos then once daily FOREVER 62 capsule 11   No current facility-administered medications for this visit.    Review of Systems PER HPI OTHERWISE ALL SYSTEMS ARE NEGATIVE.     Objective:   Physical Exam  Constitutional: He is oriented to person, place, and time. He appears well-developed and  well-nourished. No distress.  HENT:  Head: Normocephalic and atraumatic.  Mouth/Throat: Oropharynx is clear and moist. No oropharyngeal exudate.  Eyes: Pupils are equal, round, and reactive to light. No scleral icterus.  Neck: Normal range of motion. Neck supple.  Cardiovascular: Normal rate, regular rhythm and normal heart sounds.   Pulmonary/Chest: Effort normal and breath sounds normal. No respiratory distress.  Abdominal: Soft. Bowel sounds are normal. He exhibits no distension. There is no tenderness.  obese  Musculoskeletal: He exhibits no edema.  Lymphadenopathy:    He has no cervical adenopathy.  Neurological: He is alert and oriented to person, place, and time.  NO FOCAL DEFICITS   Psychiatric: He has a normal mood and affect.  Vitals reviewed.         Assessment & Plan:

## 2015-06-28 ENCOUNTER — Ambulatory Visit: Payer: Self-pay | Admitting: Family Medicine

## 2015-07-02 ENCOUNTER — Encounter: Payer: Self-pay | Admitting: Family Medicine

## 2015-07-02 ENCOUNTER — Ambulatory Visit (INDEPENDENT_AMBULATORY_CARE_PROVIDER_SITE_OTHER): Payer: Self-pay | Admitting: Family Medicine

## 2015-07-02 VITALS — BP 131/86 | HR 66 | Temp 97.7°F | Ht 65.0 in | Wt 265.0 lb

## 2015-07-02 DIAGNOSIS — Z23 Encounter for immunization: Secondary | ICD-10-CM

## 2015-07-02 DIAGNOSIS — L720 Epidermal cyst: Secondary | ICD-10-CM

## 2015-07-02 DIAGNOSIS — K219 Gastro-esophageal reflux disease without esophagitis: Secondary | ICD-10-CM

## 2015-07-02 DIAGNOSIS — Z720 Tobacco use: Secondary | ICD-10-CM

## 2015-07-02 MED ORDER — BUPROPION HCL ER (SR) 150 MG PO TB12: 150.0000 mg | ORAL_TABLET | Freq: Two times a day (BID) | ORAL | Status: DC

## 2015-07-02 NOTE — Patient Instructions (Signed)
Nice to meet you!  I have sent bupropion to help you stop smoking, see the directions I gave you.   Your labs look good, you would benefit from regular aerobic exercise. 20 minutes of walking 3 times a week is an easy first goal  Lets see you back in 6 weeks to discuss smoking and exercise.   1800 quit now

## 2015-07-02 NOTE — Progress Notes (Signed)
   HPI  Patient presents today to establish care.  Patient explains that he does not have a PCP as he has not had insurance for several years.  He is in good health, his recent labs only show a low HDL, otherwise they're very normal. He had a slightly impaired fasting blood glucose that his A1c was fine.  He denies any problems breathing, chest pain, or palpitations. He previously had intermittent tachycardia for which she underwent an ablation, he's had no symptoms since that time.  Cysts He has 2 cysts, one in his neck and one in his mid back that he would like removed. At times they get inflamed and very painful. He needs to use antibodies for this or get them lanced. He states that they're still irritating even when they're not inflamed. They've been there for several years.  He denies any fever, chills, sweats.  Smoking Smokes one pack per day for 20+ years, he would like to quit. He previously quit cold [place] which lasted 9 months.  PMH: Smoking status noted ROS: Per HPI  Objective: BP 131/86 mmHg  Pulse 66  Temp(Src) 97.7 F (36.5 C) (Oral)  Ht 5\' 5"  (1.651 m)  Wt 265 lb (120.203 kg)  BMI 44.10 kg/m2 Gen: NAD, alert, cooperative with exam HEENT: NCAT CV: RRR, good S1/S2, no murmur Resp: CTABL, no wheezes, non-labored Abd: SNTND, BS present, no guarding or organomegaly Ext: No edema, warm Neuro: Alert and oriented, No gross deficits Skin: R neck with approx 1.5 cm subcutaneous skin nodule that is mobile and firm, left-sided thoracic back with another larger, approximately 2 cm in diameter subcutaneous skin nodule which is firm and mobile  Assessment and plan:  # Smoking cessation Discussed with him, started Wellbutrin Also encouraged smoking hotline Follow-up 4-6 weeks  # Epidermal inclusion cyst He would like these removed, ask him to make an appointment for and we will start on the first one No infection currently, they are irritated and irritating to him  as they cause discomfort.  # GERD On Prilosec, continue    Meds ordered this encounter  Medications  . DISCONTD: buPROPion (WELLBUTRIN SR) 150 MG 12 hr tablet    Sig: Take 1 tablet (150 mg total) by mouth 2 (two) times daily.    Dispense:  60 tablet    Refill:  2  . buPROPion (WELLBUTRIN SR) 150 MG 12 hr tablet    Sig: Take 1 tablet (150 mg total) by mouth 2 (two) times daily.    Dispense:  60 tablet    Refill:  [place], MD [facility] 07/02/2015, 2:38 PM

## 2015-07-07 ENCOUNTER — Telehealth: Payer: Self-pay | Admitting: Family Medicine

## 2015-07-07 NOTE — Telephone Encounter (Signed)
Called, no answer. Was going to discuss possibility of sending to derm for cyst removeal, however I see he has 100% financial assistance here so I will go ahead and proceed but at 1 at a time. He has 2 EICs, 1 back and 1 neck.    Laroy Apple, MD [facility] 07/07/2015, 1:01 PM

## 2015-07-23 ENCOUNTER — Ambulatory Visit (INDEPENDENT_AMBULATORY_CARE_PROVIDER_SITE_OTHER): Payer: Self-pay | Admitting: Family Medicine

## 2015-07-23 ENCOUNTER — Telehealth: Payer: Self-pay | Admitting: Family Medicine

## 2015-07-23 ENCOUNTER — Encounter: Payer: Self-pay | Admitting: Family Medicine

## 2015-07-23 VITALS — BP 124/90 | HR 71 | Temp 97.6°F | Ht 65.0 in | Wt 269.2 lb

## 2015-07-23 DIAGNOSIS — J209 Acute bronchitis, unspecified: Secondary | ICD-10-CM

## 2015-07-23 DIAGNOSIS — L720 Epidermal cyst: Secondary | ICD-10-CM

## 2015-07-23 MED ORDER — AZITHROMYCIN 250 MG PO TABS: ORAL_TABLET | ORAL | Status: DC

## 2015-07-23 MED ORDER — GUAIFENESIN-CODEINE 100-10 MG/5ML PO SOLN: 5.0000 mL | Freq: Four times a day (QID) | ORAL | Status: DC | PRN

## 2015-07-23 NOTE — Patient Instructions (Signed)
Great to see you!  We will make calls regarding the cyst  Start the azithromycin The cough medicine will make you sleepy so do not take it before driving  Acute Bronchitis Bronchitis is inflammation of the airways that extend from the windpipe into the lungs (bronchi). The inflammation often causes mucus to develop. This leads to a cough, which is the most common symptom of bronchitis.  In acute bronchitis, the condition usually develops suddenly and goes away over time, usually in a couple weeks. Smoking, allergies, and asthma can make bronchitis worse. Repeated episodes of bronchitis may cause further lung problems.  CAUSES Acute bronchitis is most often caused by the same virus that causes a cold. The virus can spread from person to person (contagious) through coughing, sneezing, and touching contaminated objects. SIGNS AND SYMPTOMS   Cough.   Fever.   Coughing up mucus.   Body aches.   Chest congestion.   Chills.   Shortness of breath.   Sore throat.  DIAGNOSIS  Acute bronchitis is usually diagnosed through a physical exam. Your health care provider will also ask you questions about your medical history. Tests, such as chest X-rays, are sometimes done to rule out other conditions.  TREATMENT  Acute bronchitis usually goes away in a couple weeks. Oftentimes, no medical treatment is necessary. Medicines are sometimes given for relief of fever or cough. Antibiotic medicines are usually not needed but may be prescribed in certain situations. In some cases, an inhaler may be recommended to help reduce shortness of breath and control the cough. A cool mist vaporizer may also be used to help thin bronchial secretions and make it easier to clear the chest.  HOME CARE INSTRUCTIONS  Get plenty of rest.   Drink enough fluids to keep your urine clear or pale yellow (unless you have a medical condition that requires fluid restriction). Increasing fluids may help thin your  respiratory secretions (sputum) and reduce chest congestion, and it will prevent dehydration.   Take medicines only as directed by your health care provider.  If you were prescribed an antibiotic medicine, finish it all even if you start to feel better.  Avoid smoking and secondhand smoke. Exposure to cigarette smoke or irritating chemicals will make bronchitis worse. If you are a smoker, consider using nicotine gum or skin patches to help control withdrawal symptoms. Quitting smoking will help your lungs heal faster.   Reduce the chances of another bout of acute bronchitis by washing your hands frequently, avoiding people with cold symptoms, and trying not to touch your hands to your mouth, nose, or eyes.   Keep all follow-up visits as directed by your health care provider.  SEEK MEDICAL CARE IF: Your symptoms do not improve after 1 week of treatment.  SEEK IMMEDIATE MEDICAL CARE IF:  You develop an increased fever or chills.   You have chest pain.   You have severe shortness of breath.  You have bloody sputum.   You develop dehydration.  You faint or repeatedly feel like you are going to pass out.  You develop repeated vomiting.  You develop a severe headache. MAKE SURE YOU:   Understand these instructions.  Will watch your condition.  Will get help right away if you are not doing well or get worse.   This information is not intended to replace advice given to you by your health care provider. Make sure you discuss any questions you have with your health care provider.   Document Released: 10/19/2004 Document  Revised: 10/02/2014 Document Reviewed: 03/04/2013 Elsevier Interactive Patient Education [employer].

## 2015-07-23 NOTE — Telephone Encounter (Signed)
Called and discussed cyst removal, I am undecided if it is within my scope of practice given its size. I think it is smart to get him referred for removal, consider another practitioner in this office vs surgery vs [facility] considering he has [facility] financial assistance.   He still wants to come in for suspected bronchitis  Laroy Apple, MD [facility] 07/23/2015, 7:41 AM

## 2015-07-23 NOTE — Progress Notes (Signed)
   HPI  Patient presents today here to follow-up for epidermal inclusion cyst and cough.  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 He states that it has become inflamed and then lanced several times. No concern for flare today  Cough Complains of cough, shortness of breath, nasal congestion, and chest tightness for about one week. Over the last day or 2 years also developed anterior chest pain with cough. He still smoking, he is starting Wellbutrin to try and quit.   PMH: Smoking status noted ROS: Per HPI  Objective: BP 124/90 mmHg  Pulse 71  Temp(Src) 97.6 F (36.4 C) (Oral)  Ht 5\' 5"  (1.651 m)  Wt 269 lb 3.2 oz (122.108 kg)  BMI 44.80 kg/m2 Gen: NAD, alert, cooperative with exam HEENT: NCAT CV: RRR, good S1/S2, no murmur Resp: CTABL, no wheezes, non-labored Ext: No edema, warm Neuro: Alert and oriented, No gross deficits Skin: firm cyst like subcutaneous structure on L lower back approx 2  cm in diameter, no tenderness to palpation  Assessment and plan:  # Acute bronchitis Given time course and smoking although ahead and treat Azithromycin Guaifenesin with codeine for cough at night Reasons to return reviewed  # Epidermal inclusion cyst Will refer to surgery, unable to help with financial assistance will refer to another [facility] practice with procedure clinic.    Orders Placed This Encounter  Procedures  . Ambulatory referral to General Surgery    Referral Priority:  Routine    Referral Type:  Surgical    Referral Reason:  Specialty Services Required    Requested Specialty:  General Surgery    Number of Visits Requested:  1    Meds ordered this encounter  Medications  . azithromycin (ZITHROMAX) 250 MG tablet    Sig: Take 2 tablets on day 1 and 1 tablet daily after that    Dispense:  6 tablet    Refill:  0  . guaiFENesin-codeine 100-10 MG/5ML syrup    Sig: Take 5 mLs by mouth every 6 (six) hours as needed for cough.    Dispense:   120 mL    Refill:  0    Laroy Apple, MD [facility] 07/23/2015, 9:11 AM

## 2015-08-20 ENCOUNTER — Emergency Department (HOSPITAL_COMMUNITY)
Admission: EM | Admit: 2015-08-20 | Discharge: 2015-08-20 | Disposition: A | Discharge status: Home / Self Care | Payer: Self-pay | Attending: Emergency Medicine | Admitting: Emergency Medicine

## 2015-08-20 ENCOUNTER — Emergency Department (HOSPITAL_COMMUNITY): Payer: Self-pay

## 2015-08-20 ENCOUNTER — Encounter (HOSPITAL_COMMUNITY): Payer: Self-pay | Admitting: Emergency Medicine

## 2015-08-20 DIAGNOSIS — Z79899 Other long term (current) drug therapy: Secondary | ICD-10-CM | POA: Insufficient documentation

## 2015-08-20 DIAGNOSIS — Z85828 Personal history of other malignant neoplasm of skin: Secondary | ICD-10-CM | POA: Insufficient documentation

## 2015-08-20 DIAGNOSIS — Z8679 Personal history of other diseases of the circulatory system: Secondary | ICD-10-CM | POA: Insufficient documentation

## 2015-08-20 DIAGNOSIS — Z792 Long term (current) use of antibiotics: Secondary | ICD-10-CM | POA: Insufficient documentation

## 2015-08-20 DIAGNOSIS — J209 Acute bronchitis, unspecified: Secondary | ICD-10-CM | POA: Insufficient documentation

## 2015-08-20 DIAGNOSIS — F17210 Nicotine dependence, cigarettes, uncomplicated: Secondary | ICD-10-CM | POA: Insufficient documentation

## 2015-08-20 DIAGNOSIS — Z8619 Personal history of other infectious and parasitic diseases: Secondary | ICD-10-CM | POA: Insufficient documentation

## 2015-08-20 DIAGNOSIS — K219 Gastro-esophageal reflux disease without esophagitis: Secondary | ICD-10-CM | POA: Insufficient documentation

## 2015-08-20 DIAGNOSIS — J40 Bronchitis, not specified as acute or chronic: Secondary | ICD-10-CM

## 2015-08-20 MED ORDER — LEVOFLOXACIN 500 MG PO TABS: 500.0000 mg | ORAL_TABLET | Freq: Every day | ORAL | Status: DC

## 2015-08-20 MED ORDER — PREDNISONE 20 MG PO TABS: ORAL_TABLET | ORAL | Status: DC

## 2015-08-20 MED ORDER — PREDNISONE 10 MG PO TABS
60.0000 mg | ORAL_TABLET | Freq: Once | ORAL | Status: AC
  Administered 2015-08-20: 60 mg via ORAL
  Filled 2015-08-20: qty 1

## 2015-08-20 MED ORDER — LEVOFLOXACIN 500 MG PO TABS
500.0000 mg | ORAL_TABLET | Freq: Once | ORAL | Status: AC
  Administered 2015-08-20: 500 mg via ORAL
  Filled 2015-08-20: qty 1

## 2015-08-20 NOTE — ED Notes (Signed)
MD Zammit at bedside. 

## 2015-08-20 NOTE — Discharge Instructions (Signed)
Follow up if not improving

## 2015-08-20 NOTE — ED Notes (Signed)
Pt states that he had a bad cough a few weeks ago and was treated with a zpack.  States that cough has returned and he wants to get something before it gets bad again.

## 2015-08-20 NOTE — ED Provider Notes (Signed)
CSN: [id number]     Arrival date & time 08/20/15  0645 History   First MD Initiated Contact with Patient 08/20/15 0700     Chief Complaint  Patient presents with  . Cough     (Consider location/radiation/quality/duration/timing/severity/associated sxs/prior Treatment) Patient is a 54 y.o. male presenting with cough. The history is provided by the patient (Patient complains of a cough and some shortness of breath. Patient was treated with Z-Pak and improved and then the cough returned).  Cough Cough characteristics:  Non-productive Severity:  Moderate Onset quality:  Sudden Timing:  Constant Progression:  Waxing and waning Chronicity:  Recurrent Context: animal exposure   Associated symptoms: no chest pain, no eye discharge, no headaches and no rash     Past Medical History  Diagnosis Date  . GERD (gastroesophageal reflux disease)   . HCV (hepatitis C virus) DR. ZACKS    AUG 2013 VIRAL LOAD UNDETECTABLE  . Skin cancer 1992    removed from side of head  . Pancreatitis, gallstone   . SVT (supraventricular tachycardia) [facility])    Past Surgical History  Procedure Laterality Date  . Jaw broken    . Cholecystectomy    . Lipoma removals      SEVERAL  . Colonoscopy  10/04/2012    SLF:ONE/8 SIMPLE ADENOMA, TICS, SML IH  . 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 Supraventricular tachycardia ablation N/A 06/22/2014    Procedure: SUPRAVENTRICULAR TACHYCARDIA ABLATION;  Surgeon: Evans Lance, MD;  Location[facility] CATH LAB;  Service: Cardiovascular;  Laterality: N/A;  . Esophagogastroduodenoscopy N/A 09/04/2014    Procedure: ESOPHAGOGASTRODUODENOSCOPY (EGD);  Surgeon: Danie Binder, MD;  Location: [facility] ENDO SUITE;  Service: Endoscopy;  Laterality: N/A;  145  . Gallbladder surgery     Family History  Problem Relation Age of Onset  . Colon cancer Neg Hx   . Emphysema Mother   . COPD Mother   . Early death Mother   . Hyperlipidemia Father   . Stroke Father   . Diabetes  Father   . High Cholesterol Father    Social History  Substance Use Topics  . 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 Smokeless tobacco: Never Used     Comment: had quit for 9 mos, picked up again 2 mos ago  . Alcohol Use: No    Review of Systems  Constitutional: Negative for appetite change and fatigue.  HENT: Negative for congestion, ear discharge and sinus pressure.   Eyes: Negative for discharge.  Respiratory: Positive for cough.   Cardiovascular: Negative for chest pain.  Gastrointestinal: Negative for abdominal pain and diarrhea.  Genitourinary: Negative for frequency and hematuria.  Musculoskeletal: Negative for back pain.  Skin: Negative for rash.  Neurological: Negative for seizures and headaches.  Psychiatric/Behavioral: Negative for hallucinations.      Allergies  Review of patient's allergies indicates no known allergies.  Home Medications   Prior to Admission medications   Medication Sig Start Date End Date Taking? Authorizing Provider  azithromycin (ZITHROMAX) 250 MG tablet Take 2 tablets on day 1 and 1 tablet daily after that 07/23/15   Timmothy Euler, MD  buPROPion [facility] SR) 150 MG 12 hr tablet Take 1 tablet (150 mg total) by mouth 2 (two) times daily. Patient not taking: Reported on 07/23/2015 07/02/15   Timmothy Euler, MD  guaiFENesin-codeine 100-10 MG/5ML syrup Take 5 mLs by mouth  every 6 (six) hours as needed for cough. 07/23/15   Timmothy Euler, MD  levofloxacin (LEVAQUIN) 500 MG tablet Take 1 tablet (500 mg total) by mouth daily. 08/20/15   Milton Ferguson, MD  omeprazole (PRILOSEC) 20 MG capsule 1 po bid 30 minutes before meals for 3 mos then once daily FOREVER 09/07/14   Danie Binder, MD  predniSONE (DELTASONE) 20 MG tablet 2 tabs po daily x 3 days 08/20/15   Milton Ferguson, MD   BP 118/87 mmHg  Pulse 64  Temp(Src) 97.7 F (36.5 C) (Oral)  Resp 18  Ht 5\' 5"  (1.651 m)  Wt 263  lb (119.296 kg)  BMI 43.77 kg/m2  SpO2 97% Physical Exam  Constitutional: He is oriented to person, place, and time. He appears well-developed.  HENT:  Head: Normocephalic.  Eyes: Conjunctivae and EOM are normal. No scleral icterus.  Neck: Neck supple. No thyromegaly present.  Cardiovascular: Normal rate and regular rhythm.  Exam reveals no gallop and no friction rub.   No murmur heard. Pulmonary/Chest: No stridor. He has wheezes. He has no rales. He exhibits no tenderness.  Abdominal: He exhibits no distension. There is no tenderness. There is no rebound.  Musculoskeletal: Normal range of motion. He exhibits no edema.  Lymphadenopathy:    He has no cervical adenopathy.  Neurological: He is oriented to person, place, and time. He exhibits normal muscle tone. Coordination normal.  Skin: No rash noted. No erythema.  Psychiatric: He has a normal mood and affect. His behavior is normal.    ED Course  Procedures (including critical care time) Labs Review Labs Reviewed - No data to display  Imaging Review Dg Chest 2 View  08/20/2015  CLINICAL DATA:  Cough for 3 days. EXAM: CHEST - 2 VIEW COMPARISON:  One-view chest x-ray 03/15/2015. CT of the chest 12/10/2013. FINDINGS: The heart size is normal. A high-density nodule in the left upper lobe is stable. The lungs are otherwise clear. The visualized soft tissues and bony thorax are unremarkable. IMPRESSION: No acute cardiopulmonary disease or significant interval change. Electronically Signed   By: San Morelle M.D.   On: 08/20/2015 07:45   I have personally reviewed and evaluated these images and lab results as part of my medical decision-making.   EKG Interpretation None      MDM   Final diagnoses:  Bronchitis    Chest x-ray unremarkable.  Will treat for bronchitis and bronchospasm with prednisone and Levaquin    Milton Ferguson, MD 08/20/15 [phone]

## 2015-08-23 ENCOUNTER — Ambulatory Visit (INDEPENDENT_AMBULATORY_CARE_PROVIDER_SITE_OTHER): Payer: Self-pay | Admitting: Family Medicine

## 2015-08-23 ENCOUNTER — Encounter: Payer: Self-pay | Admitting: Family Medicine

## 2015-08-23 VITALS — BP 136/86 | HR 74 | Temp 97.3°F | Ht 65.0 in | Wt 270.4 lb

## 2015-08-23 DIAGNOSIS — J209 Acute bronchitis, unspecified: Secondary | ICD-10-CM

## 2015-08-23 MED ORDER — ALBUTEROL SULFATE HFA 108 (90 BASE) MCG/ACT IN AERS: 2.0000 | INHALATION_SPRAY | Freq: Four times a day (QID) | RESPIRATORY_TRACT | Status: DC | PRN

## 2015-08-23 MED ORDER — BENZONATATE 100 MG PO CAPS: 100.0000 mg | ORAL_CAPSULE | Freq: Two times a day (BID) | ORAL | Status: DC | PRN

## 2015-08-23 NOTE — Patient Instructions (Signed)
Great to see you!  Start the medications given in the ER, try tessalon perles for your cough but the inhaler and prednisone are probably the best things for you right now.   Acute Bronchitis Bronchitis is when the airways that extend from the windpipe into the lungs get red, puffy, and painful (inflamed). Bronchitis often causes thick spit (mucus) to develop. This leads to a cough. A cough is the most common symptom of bronchitis. In acute bronchitis, the condition usually begins suddenly and goes away over time (usually in 2 weeks). Smoking, allergies, and asthma can make bronchitis worse. Repeated episodes of bronchitis may cause more lung problems. HOME CARE  Rest.  Drink enough fluids to keep your pee (urine) clear or pale yellow (unless you need to limit fluids as told by your doctor).  Only take over-the-counter or prescription medicines as told by your doctor.  Avoid smoking and secondhand smoke. These can make bronchitis worse. If you are a smoker, think about using nicotine gum or skin patches. Quitting smoking will help your lungs heal faster.  Reduce the chance of getting bronchitis again by:  Washing your hands often.  Avoiding people with cold symptoms.  Trying not to touch your hands to your mouth, nose, or eyes.  Follow up with your doctor as told. GET HELP IF: Your symptoms do not improve after 1 week of treatment. Symptoms include:  Cough.  Fever.  Coughing up thick spit.  Body aches.  Chest congestion.  Chills.  Shortness of breath.  Sore throat. GET HELP RIGHT AWAY IF:   You have an increased fever.  You have chills.  You have severe shortness of breath.  You have bloody thick spit (sputum).  You throw up (vomit) often.  You lose too much body fluid (dehydration).  You have a severe headache.  You faint. MAKE SURE YOU:   Understand these instructions.  Will watch your condition.  Will get help right away if you are not doing well  or get worse.   This information is not intended to replace advice given to you by your health care provider. Make sure you discuss any questions you have with your health care provider.   Document Released: 02/28/2008 Document Revised: 05/14/2013 Document Reviewed: 03/04/2013 Elsevier Interactive Patient Education [employer].

## 2015-08-23 NOTE — Progress Notes (Signed)
   HPI  Patient presents today here for acute illness.  Patient's complains of 7 days of cough, malaise, subjective fever, shortness of breath. He is severe cough at night. He came from sleeping. He was seen in the ER a few days ago and given Levaquin and prednisone which he has not started. He denies chronic cough,  he is a current smoker.   PMH: Smoking status noted ROS: Per HPI  Objective: BP 136/86 mmHg  Pulse 74  Temp(Src) 97.3 F (36.3 C) (Oral)  Ht 5\' 5"  (1.651 m)  Wt 270 lb 6.4 oz (122.653 kg)  BMI 45.00 kg/m2 Gen: NAD, alert, cooperative with exam HEENT: NCAT CV: RRR, good S1/S2, no murmur Resp: Nonlabored, decreased air movement, coarse expiratory sounds but not very impressive wheezing Ext: No edema, warm Neuro: Alert and oriented, No gross deficits  Assessment and plan:  # Acute bronchitis Possibly underlying mild COPD, considered a COPD exacerbation Recent azithromycin course about one month ago, the ER has escalated to Levaquin which is a very reasonable move Prednisone as well I have added albuterol and Tessalon Perles, he is using his wife's nebulizer which I have encouraged him to continue as needed, I will gladly refill nebulizer solution if needed but he declines at this time. I also encouraged him to start the medications that were given in the ER He requests tussinex which I declined due to a recent course one month ago When his breathing has improved from this acute illness consider PFTs and/or discussion about likely mild COPD.    Meds ordered this encounter  Medications  . benzonatate (TESSALON) 100 MG capsule    Sig: Take 1 capsule (100 mg total) by mouth 2 (two) times daily as needed for cough.    Dispense:  20 capsule    Refill:  0  . albuterol (PROVENTIL HFA;VENTOLIN HFA) 108 (90 BASE) MCG/ACT inhaler    Sig: Inhale 2 puffs into the lungs every 6 (six) hours as needed for wheezing or shortness of breath.    Dispense:  1 Inhaler    Refill:   0    Laroy Apple, MD [facility] 08/23/2015, 1:12 PM

## 2015-09-09 ENCOUNTER — Ambulatory Visit (INDEPENDENT_AMBULATORY_CARE_PROVIDER_SITE_OTHER): Payer: Self-pay | Admitting: Family Medicine

## 2015-09-09 VITALS — BP 125/82 | HR 58 | Temp 98.1°F | Ht 65.0 in | Wt 267.0 lb

## 2015-09-09 DIAGNOSIS — L723 Sebaceous cyst: Secondary | ICD-10-CM

## 2015-09-09 MED ORDER — CEPHALEXIN 500 MG PO CAPS: 500.0000 mg | ORAL_CAPSULE | Freq: Three times a day (TID) | ORAL | Status: DC

## 2015-09-09 NOTE — Patient Instructions (Addendum)
Today we removed the large inflamed sebaceous cyst from your back.  I have sent a Rx in for five days of antibiotics, take one three times a day.  You have some sutures that need to be removed in 7-10 days. They can be removed by your primary care physician or by our office.  I'm placing you on 5 days of antibiotics just as a precaution. If the cyst area looks red or starts using oozing pus or if  you have fever or significant pain, please either call us or your PCP immediately. Call us if you have any questions as well.  Get your stitches removed in 7-10 days. In the meantime, you may shower starting tomorrow.  After each shower anytime you get this area wet, please clean the stitches with some topical rubbing alcohol.  You can keep a Band-Aid over this area but you do not have to. Have someone look at it each day to make sure it's not looking infected.  Regarding the cyst on your right posterior neck, I would tend to just watch that one. I think that is a fairly deep structure and I suspect it is a lipoma rather than a sebaceous cyst which means it would be much more difficult to take out. Unless  it  became particularly worrisome, I don't think I would do anything with it.   Certainly if it swelled up and became red and infected looking then we would be happy to take another look at it. You can discuss this with Dr. Wendi Snipes as well.  It was great meeting you and I hope you have a Happy Holiday season.

## 2015-09-09 NOTE — Progress Notes (Signed)
   Subjective:    Patient ID: Thomas Ball, male    DOB: 30-Nov-1960, 54 y.o.   MRN: [id number]  HPI Here for evaluation of 2 cysts. One on the back has been there many many years. It swells and rains frequently. He occasionally pokes a needle into it to see if he can get it to rupture. He did this yesterday. The one on his neck also occasionally gets a little larger but has only drained once. The wall and then neck occasionally is on-call for tubal or painful. The one the back is just bothersome when it gets large and drains.  PERTINENT  PMH / PSH: I have reviewed the patient's medications, allergies, past medical and surgical history. Pertinent findings that relate to today's visit / issues include: Hepatitis C virus, viral load undetectable 2013. Diagnosis of liver cirrhosis thought to be related to either hepatitis C or fatty liver. History of SVT status post ablation History of gallstone pancreatitis He is not on any anticoagulation and has no known immunosuppression He does smoke and has obesity  Review of Systems See history of present illness. Additional pertinent review of systems is negative for fever, sweats, chills.    Objective:   Physical Exam  SKIN: Posterior right neck reveals a 1-1/2 cm that is mobile. There is no fluctuance. No erythema around this. I see no sign of discharge. It is nontender to palpation. In the central thoracic back there is a 3 cm in diameter mass that is deeply subcutaneous. I see no sign of erythema, is not warm, is very slightly tender to palpation. It is consistent with an inflamed sebaceous cyst that has associated scarring.   PROCEDURE NOTE: We discussed the possibilities of excision of the probable sebaceous cyst in the mid back. Patient was given informed consent and there is a signed copy in the chart. Appropriate time out was taken. I was assisted by Drs Minda Ditto and Juanito Doom. Area prepped and draped in usual sterile fashion. A total of 5 mL 1%  lidocaine with epinephrine was used to obtain local anesthesia. Once anesthesia was obtained, a horizontal incision was made at the top portion of the cyst. Blunt dissection was used to undermine the sebaceous cyst and associated sac, including scar tissue.. There was evidence of multiple prior ruptures with some associated scar tissue. This was debrided until the majority of the sac and sebaceous cyst material were adequately removed.. The incision was then closed with 3 deep sutures of 4-0 Vicryl and surface skin was closed with 7 interrupted sutures using 4-0 Prolene.  Estimated blood loss was insignificant. There are no complications. Patient tolerated the procedure extremely well.   Patient given post op instructions. I will place him on 5 days of antibiotic just because the cyst was so deep and extensive. He was given written patient instructions including instructions to get superficial sutures removed in 7-10 days.      Assessment & Plan:

## 2015-09-17 ENCOUNTER — Ambulatory Visit: Payer: Self-pay | Admitting: Family Medicine

## 2015-09-17 ENCOUNTER — Ambulatory Visit (INDEPENDENT_AMBULATORY_CARE_PROVIDER_SITE_OTHER): Payer: Self-pay | Admitting: Family Medicine

## 2015-09-17 ENCOUNTER — Encounter: Payer: Self-pay | Admitting: Family Medicine

## 2015-09-17 VITALS — BP 127/86 | HR 78 | Temp 97.3°F | Ht 65.0 in | Wt 268.8 lb

## 2015-09-17 DIAGNOSIS — R059 Cough, unspecified: Secondary | ICD-10-CM | POA: Insufficient documentation

## 2015-09-17 DIAGNOSIS — R05 Cough: Secondary | ICD-10-CM

## 2015-09-17 DIAGNOSIS — L720 Epidermal cyst: Secondary | ICD-10-CM

## 2015-09-17 NOTE — Progress Notes (Signed)
   HPI  Patient presents today here today for cough and suture removal.  He's doing well after his sebaceous cyst removal he has had a slight bit of drainage and some pain about 4 days ago. Today he had a very small amount of yellow drainage, yesterday he had a small amount of bloody drainage. He denies fever, chills, nausea, or malaise.  Cough He's had a cough for about a month, it is improved by albuterol. He is a one pack per day smoker for over 20 years. He denies any significant dyspnea He has no chest pain, fever, malaise. He's been treated during this time with azithromycin, Levaquin, and Keflex after his sebaceous cyst removal.    PMH: Smoking status noted ROS: Per HPI  Objective: BP 127/86 mmHg  Pulse 78  Temp(Src) 97.3 F (36.3 C) (Oral)  Ht 5\' 5"  (1.651 m)  Wt 268 lb 12.8 oz (121.927 kg)  BMI 44.73 kg/m2 Gen: NAD, alert, cooperative with exam HEENT: NCAT, nares with swelling patient and the right, TMs normal bilaterally, oropharynx clear CV: RRR, good S1/S2, no murmur Resp: CTABL, no wheezes, non-labored Ext: No edema, warm Neuro: Alert and oriented, No gross deficits Skin: 7 sutures removed from the area of his sebaceous cyst, he has some fluctuance and redness surrounding the sutures that is not appropriate for normal suture reaction No drainage, no tenderness to palpation  Assessment and plan:  # Cough I suspect postnasal drip and possibly a component of COPD Start Flonase or Nasacort Given a sample of Symbicort today, 2 week supply, we can certainly try 2 more weeks if he has good improvement and continues to need it.   # Suture removal I believe you develop a seroma at the area of his previous sebaceous cyst We discussed this in length Do to be resolved within 2-3 weeks without any other interventions. If it opens back up plan to pack it and let it heal secondarily No signs of infection   Laroy Apple, MD [facility] 09/17/2015, 8:32 AM

## 2015-09-17 NOTE — Patient Instructions (Signed)
Great to see you!  Try flonase for your cough, if you develop worsening cough, shortness of breath or fever please come back right away  If the wound opens backup please come back and we will help with packing, I think it will hold easily though.   Consider stopping smoking.

## 2015-10-07 ENCOUNTER — Encounter: Payer: Self-pay | Admitting: Family Medicine

## 2015-10-07 ENCOUNTER — Ambulatory Visit (INDEPENDENT_AMBULATORY_CARE_PROVIDER_SITE_OTHER): Payer: Self-pay | Admitting: Family Medicine

## 2015-10-07 VITALS — BP 118/79 | HR 72 | Temp 98.3°F | Ht 65.0 in | Wt 276.0 lb

## 2015-10-07 DIAGNOSIS — N400 Enlarged prostate without lower urinary tract symptoms: Secondary | ICD-10-CM

## 2015-10-07 DIAGNOSIS — L720 Epidermal cyst: Secondary | ICD-10-CM

## 2015-10-07 MED ORDER — CEPHALEXIN 500 MG PO CAPS: 500.0000 mg | ORAL_CAPSULE | Freq: Three times a day (TID) | ORAL | Status: DC

## 2015-10-07 MED ORDER — TAMSULOSIN HCL 0.4 MG PO CAPS: 0.4000 mg | ORAL_CAPSULE | Freq: Every day | ORAL | Status: DC

## 2015-10-07 NOTE — Patient Instructions (Addendum)
Great to see you!  Take the packing out in 2-3 days (saturday or Sunday)  Take keflex three times daily for 7 days  We will call about the PSA results, start tamsulosin for your frequent nighttime urination

## 2015-10-07 NOTE — Progress Notes (Signed)
   HPI  Patient presents today for follow-up ER visit for skin infection and prostate questions  3-4 days after our last visit the area of cyst removal opened up and began to drain. He went to the ER and was started on Bactrim. He states that it's healed up very nicely but he still has an opening with thick white discharge. He denies any fever, chills, sweats, malaise, nausea.  Prostate He asked about PSA. He was told previously after prostate exam that he had BPH He was given doxazosin but never tried it. He has 3-4 episodes of nocturia every night He would like to try medications.   PMH: Smoking status noted ROS: Per HPI  Objective: BP 118/79 mmHg  Pulse 72  Temp(Src) 98.3 F (36.8 C) (Oral)  Ht 5\' 5"  (1.651 m)  Wt 276 lb (125.193 kg)  BMI 45.93 kg/m2 Gen: NAD, alert, cooperative with exam HEENT: NCAT, EOMI, PERRL Ext: No edema, warm Neuro: Alert and oriented, No gross deficits  Skin: low back at the sight of previous sutures he has a thick scab with small opening approx 0.5 to 1 cm in length, small aamount of purulent drainage expressed without pain No redness, warmth, or induration  Approx 2-3 cm of 1/4" iodoform packing placed in the opening which he tolerated well  Assessment and plan:  # Skin infection Packed to prevent abscess formation but he is healing well Cover with keflex 1 additional week, 2 days left of bactrim (7 day course)  # BPH Based on Hx. Start flomax PSA    Orders Placed This Encounter  Procedures  . PSA    Meds ordered this encounter  Medications  . sulfamethoxazole-trimethoprim (BACTRIM DS,SEPTRA DS) 800-160 MG tablet    Sig: Take 1 tablet by mouth 2 (two) times daily.  Marland Kitchen DISCONTD: cephALEXin (KEFLEX) 500 MG capsule    Sig: Take 1 capsule (500 mg total) by mouth 3 (three) times daily.    Dispense:  30 capsule    Refill:  0  . tamsulosin (FLOMAX) 0.4 MG CAPS capsule    Sig: Take 1 capsule (0.4 mg total) by mouth daily.   Dispense:  30 capsule    Refill:  11  . cephALEXin (KEFLEX) 500 MG capsule    Sig: Take 1 capsule (500 mg total) by mouth 3 (three) times daily.    Dispense:  21 capsule    Refill:  0    Please dispense only 21 pills, disregard previous Rx    Laroy Apple, MD [facility] 10/07/2015, 9:17 AM

## 2015-10-08 LAB — PSA: Prostate Specific Ag, Serum: 1.6 ng/mL (ref 0.0–4.0)

## 2015-10-18 ENCOUNTER — Ambulatory Visit (INDEPENDENT_AMBULATORY_CARE_PROVIDER_SITE_OTHER): Payer: Self-pay | Admitting: Family Medicine

## 2015-10-18 ENCOUNTER — Encounter: Payer: Self-pay | Admitting: Family Medicine

## 2015-10-18 VITALS — BP 125/79 | HR 75 | Temp 97.5°F | Ht 65.0 in | Wt 273.8 lb

## 2015-10-18 DIAGNOSIS — L720 Epidermal cyst: Secondary | ICD-10-CM

## 2015-10-18 NOTE — Progress Notes (Signed)
   HPI  Patient presents today here for follow-up of cyst.  Patient's explains that over the last week or so he developed a knot in the area of the cyst removal. He does not have any tenderness, drainage, discomfort . He is still finishing the Keflex I gave on January 12.  He's been keeping a bandage over it, avoiding baths, and keeping it dry in general. He states that bandage is always clean never removed.   PMH: Smoking status noted ROS: Per HPI  Objective: BP 125/79 mmHg  Pulse 75  Temp(Src) 97.5 F (36.4 C) (Oral)  Ht 5\' 5"  (1.651 m)  Wt 273 lb 12.8 oz (124.195 kg)  BMI 45.56 kg/m2 Gen: NAD, alert, cooperative with exam HEENT: NCAT CV: RRR, good S1/S2, no murmur Resp: CTABL, no wheezes, non-labored Ext: No edema, warm Neuro: Alert and oriented, No gross deficits  Skin: 3-4 cm scar where the cyst was removed on his mid low back, approximately 5 mm opening in the middle with some crusting, approximately 1 cm circular nodule just distal to the opening. No warmth, tenderness, or fluctuance. It is firm.   Assessment and plan:  # Cyst healing. No signs of active infection today Finish course of Keflex Discussed general wound care, Vaseline daily with and then changes. Follow-up in 2 weeks if not resolved call with any signs of active infection  Laroy Apple, MD [facility] 10/18/2015, 8:49 AM

## 2015-11-09 ENCOUNTER — Telehealth: Payer: Self-pay | Admitting: Gastroenterology

## 2015-11-09 ENCOUNTER — Encounter: Payer: Self-pay | Admitting: Gastroenterology

## 2015-11-09 NOTE — Telephone Encounter (Signed)
Mailed letter °

## 2015-11-09 NOTE — Telephone Encounter (Signed)
MARCH RECALL FOR ULTRASOUND

## 2016-08-14 ENCOUNTER — Emergency Department (HOSPITAL_COMMUNITY): Payer: Self-pay

## 2016-08-14 ENCOUNTER — Encounter (HOSPITAL_COMMUNITY): Payer: Self-pay | Admitting: Emergency Medicine

## 2016-08-14 ENCOUNTER — Emergency Department (HOSPITAL_COMMUNITY)
Admission: EM | Admit: 2016-08-14 | Discharge: 2016-08-14 | Disposition: A | Discharge status: Home / Self Care | Payer: Self-pay | Attending: Emergency Medicine | Admitting: Emergency Medicine

## 2016-08-14 DIAGNOSIS — Z85828 Personal history of other malignant neoplasm of skin: Secondary | ICD-10-CM | POA: Insufficient documentation

## 2016-08-14 DIAGNOSIS — J209 Acute bronchitis, unspecified: Secondary | ICD-10-CM | POA: Insufficient documentation

## 2016-08-14 DIAGNOSIS — F17210 Nicotine dependence, cigarettes, uncomplicated: Secondary | ICD-10-CM | POA: Insufficient documentation

## 2016-08-14 MED ORDER — GUAIFENESIN-CODEINE 100-10 MG/5ML PO SOLN: 5.0000 mL | Freq: Four times a day (QID) | ORAL | 0 refills | Status: DC | PRN

## 2016-08-14 MED ORDER — BENZONATATE 100 MG PO CAPS
200.0000 mg | ORAL_CAPSULE | Freq: Once | ORAL | Status: AC
  Administered 2016-08-14: 200 mg via ORAL
  Filled 2016-08-14: qty 2

## 2016-08-14 MED ORDER — BENZONATATE 100 MG PO CAPS: 200.0000 mg | ORAL_CAPSULE | Freq: Three times a day (TID) | ORAL | 0 refills | Status: DC | PRN

## 2016-08-14 MED ORDER — AZITHROMYCIN 250 MG PO TABS: ORAL_TABLET | ORAL | 0 refills | Status: DC

## 2016-08-14 NOTE — Discharge Instructions (Signed)
Take the medicines as prescribed.  You may take the cough syrup as needed for symptoms.  This will make you drowsy - do not drive within 4 hours of taking this medication.  Rest and make sure you are drinking plenty of fluids. See your doctor if your symptoms are not improving.  Consider trying to stop smoking.

## 2016-08-14 NOTE — ED Provider Notes (Signed)
[facility]-EMERGENCY DEPT Provider Note    By signing my name below, I, Bea Graff, attest that this documentation has been prepared under the direction and in the presence of Evalee Jefferson, PA-C. Electronically Signed: Bea Graff, ED Scribe. 08/14/16. 4:07 PM.   History   Chief Complaint Chief Complaint  Patient presents with  . Cough    The history is provided by the patient and medical records. No language interpreter was used.    HPI Comments:  Thomas Ball is a morbidly obese 55 y.o. male who presents to the Emergency Department complaining of a productive cough of yellow sputum that began two days ago. Pt reports associated sore throat, rhinorrhea, wheezing and nasal congestion. He has not taken anything for his symptoms but has been using cough drops. He denies modifying factors. He denies fever, chills, nausea, vomiting, sob but does endorse midsternal burning transient pain triggered by cough. He denies allergies to any medications.   Past Medical History:  Diagnosis Date  . GERD (gastroesophageal reflux disease)   . HCV (hepatitis C virus) DR. ZACKS   AUG 2013 VIRAL LOAD UNDETECTABLE  . Pancreatitis, gallstone   . Skin cancer 1992   removed from side of head  . SVT (supraventricular tachycardia) [facility])     Patient Active Problem List   Diagnosis Date Noted  . BPH (benign prostatic hyperplasia) 10/07/2015  . Cough 09/17/2015  . EIC (epidermal inclusion cyst) 07/23/2015  . Tobacco abuse 07/02/2015  . SVT (supraventricular tachycardia) ([place])   . Intermittent palpitations 04/21/2014  . Bradycardia 04/21/2014  . Hypotension 04/21/2014  . Obesity 04/21/2014  . Chest pain 04/20/2014  . Cirrhosis of liver ([place]) 11/05/2013  . GERD (gastroesophageal reflux disease) 07/18/2012  . Screening for colorectal cancer 07/18/2012  . Pulmonary nodule 02/14/2012    Past Surgical History:  Procedure Laterality Date  . 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  CHOLECYSTECTOMY    . COLONOSCOPY  10/04/2012   SLF:ONE/8 SIMPLE ADENOMA, TICS, SML IH  . ESOPHAGOGASTRODUODENOSCOPY N/A 09/04/2014   Procedure: ESOPHAGOGASTRODUODENOSCOPY (EGD);  Surgeon: Danie Binder, MD;  Location: [facility] ENDO SUITE;  Service: Endoscopy;  Laterality: N/A;  145  . GALLBLADDER SURGERY    . JAW BROKEN    . LIPOMA REMOVALS     SEVERAL  . SUPRAVENTRICULAR TACHYCARDIA ABLATION N/A 06/22/2014   Procedure: SUPRAVENTRICULAR TACHYCARDIA ABLATION;  Surgeon: Evans Lance, MD;  Location: [facility] CATH LAB;  Service: Cardiovascular;  Laterality: N/A;       Home Medications    Prior to Admission medications   Medication Sig Start Date End Date Taking? Authorizing Provider  azithromycin (ZITHROMAX Z-PAK) 250 MG tablet Take 2 tablets by mouth on day one followed by one tablet daily for 4 days. 08/14/16   Evalee Jefferson, PA-C  benzonatate (TESSALON) 100 MG capsule Take 2 capsules (200 mg total) by mouth 3 (three) times daily as needed. 08/14/16   Evalee Jefferson, PA-C  cephALEXin (KEFLEX) 500 MG capsule Take 1 capsule (500 mg total) by mouth 3 (three) times daily. 10/07/15   Timmothy Euler, MD  guaiFENesin-codeine 100-10 MG/5ML syrup Take 5 mLs by mouth every 6 (six) hours as needed for cough. 08/14/16   Evalee Jefferson, PA-C  omeprazole (PRILOSEC) 20 MG capsule 1 po bid 30 minutes before meals for 3 mos then once daily FOREVER 09/07/14   Danie Binder, MD  sulfamethoxazole-trimethoprim (BACTRIM DS,SEPTRA DS) 800-160 MG tablet Take 1 tablet by mouth 2 (two) times  daily. Reported on 10/18/2015    Historical Provider, MD  tamsulosin (FLOMAX) 0.4 MG CAPS capsule Take 1 capsule (0.4 mg total) by mouth daily. Patient not taking: Reported on 10/18/2015 10/07/15   Timmothy Euler, MD    Family History Family History  Problem Relation Age of Onset  . Emphysema Mother   . COPD Mother   . Early death Mother   . Hyperlipidemia Father   . Stroke Father   . Diabetes Father   . High Cholesterol Father   .  Colon cancer Neg Hx     Social History Social History  Substance Use Topics  . Smoking status: Current Every Day Smoker    Packs/day: 1.00    Years: 28.00    Types: Cigarettes  . Smokeless tobacco: Never Used     Comment: had quit for 9 mos, picked up again 2 mos ago  . Alcohol use No     Allergies   Patient has no known allergies.   Review of Systems Review of Systems  Constitutional: Negative for chills and fever.  HENT: Positive for congestion, rhinorrhea and sore throat.   Eyes: Negative.   Respiratory: Positive for cough and wheezing. Negative for chest tightness and shortness of breath.   Cardiovascular: Negative for chest pain.  Gastrointestinal: Negative for abdominal pain, nausea and vomiting.  Genitourinary: Negative.   Musculoskeletal: Negative for arthralgias, joint swelling and neck pain.  Skin: Negative.  Negative for rash and wound.  Neurological: Negative for dizziness, weakness, light-headedness, numbness and headaches.  Psychiatric/Behavioral: Negative.      Physical Exam Updated Vital Signs BP 131/72 (BP Location: Left Arm)   Pulse 71   Temp 98.7 F (37.1 C) (Oral)   Resp 20   Ht 5' (1.524 m)   Wt 250 lb (113.4 kg)   SpO2 98%   BMI 48.82 kg/m   Physical Exam  Constitutional: He is oriented to person, place, and time. He appears well-developed and well-nourished.  HENT:  Head: Normocephalic and atraumatic.  Right Ear: Tympanic membrane and ear canal normal.  Left Ear: Tympanic membrane and ear canal normal.  Nose: Mucosal edema and rhinorrhea present.  Mouth/Throat: Uvula is midline, oropharynx is clear and moist and mucous membranes are normal. No oropharyngeal exudate, posterior oropharyngeal edema, posterior oropharyngeal erythema or tonsillar abscesses.  Eyes: Conjunctivae are normal.  Cardiovascular: Normal rate, regular rhythm and normal heart sounds.   Pulmonary/Chest: Effort normal. No respiratory distress. He has no wheezes. He has  no rales.  Transient crackle at right base that cleared with cough. Frequent dry cough during exam.  Abdominal: Soft. There is no tenderness.  Musculoskeletal: Normal range of motion.  Neurological: He is alert and oriented to person, place, and time.  Skin: Skin is warm and dry. No rash noted.  Psychiatric: He has a normal mood and affect.     ED Treatments / Results  DIAGNOSTIC STUDIES: Oxygen Saturation is 98% on RA, normal by my interpretation.   COORDINATION OF CARE: 4:00 PM- Will prescribe cough medication and antibiotic. Pt verbalizes understanding and agrees to plan.  Medications  benzonatate (TESSALON) capsule 200 mg (200 mg Oral Given 08/14/16 1631)    Labs (all labs ordered are listed, but only abnormal results are displayed) Labs Reviewed - No data to display  EKG  EKG Interpretation None       Radiology Dg Chest 2 View  Result Date: 08/14/2016 CLINICAL DATA:  Productive cough and shortness of breath EXAM: CHEST  2  VIEW COMPARISON:  08/20/2015 FINDINGS: Normal heart size and mediastinal contours. Calcified granuloma over the left lung. There is no edema, consolidation, effusion, or pneumothorax. IMPRESSION: No evidence of acute disease. Electronically Signed   By: Monte Fantasia M.D.   On: 08/14/2016 14:13    Procedures Procedures (including critical care time)  Medications Ordered in ED Medications  benzonatate (TESSALON) capsule 200 mg (200 mg Oral Given 08/14/16 1631)     Initial Impression / Assessment and Plan / ED Course  I have reviewed the triage vital signs and the nursing notes.  Pertinent labs & imaging results that were available during my care of the patient were reviewed by me and considered in my medical decision making (see chart for details).  Clinical Course      I personally performed the services described in this documentation, which was scribed in my presence. The recorded information has been reviewed and is  accurate.   Final Clinical Impressions(s) / ED Diagnoses   Final diagnoses:  Acute bronchitis, unspecified organism    New Prescriptions Discharge Medication List as of 08/14/2016  4:10 PM    START taking these medications   Details  azithromycin (ZITHROMAX Z-PAK) 250 MG tablet Take 2 tablets by mouth on day one followed by one tablet daily for 4 days., Print    benzonatate (TESSALON) 100 MG capsule Take 2 capsules (200 mg total) by mouth 3 (three) times daily as needed., Starting Mon 08/14/2016, Print    guaiFENesin-codeine 100-10 MG/5ML syrup Take 5 mLs by mouth every 6 (six) hours as needed for cough., Starting Mon 08/14/2016, Print         Evalee Jefferson, PA-C 08/16/16 [id number]    Fredia Sorrow, MD 08/21/16 1356

## 2016-08-14 NOTE — ED Triage Notes (Signed)
Cough with yellow sputum for a few days, tired, associated  sore throat

## 2016-10-16 ENCOUNTER — Telehealth: Payer: Self-pay | Admitting: Gastroenterology

## 2016-10-16 NOTE — Telephone Encounter (Signed)
PATIENT WOULD LIKE HIS CHOLESTEROL TO BE CHECKED ON HIS NEXT LABS THAT ARE DUE

## 2016-10-16 NOTE — Telephone Encounter (Signed)
PT has OV with Dr. Oneida Alar on 11/08/2016 at 2:30 pm. Forwarding to Dr. Oneida Alar to advise on labs needed.

## 2016-10-17 NOTE — Telephone Encounter (Signed)
LMOM to call.

## 2016-10-17 NOTE — Telephone Encounter (Signed)
PT is aware and he will get his lab orders when he comes in for his appt.

## 2016-10-17 NOTE — Telephone Encounter (Signed)
PLEASE CALL PT. IT WOULD BE BEST FOR HIS PCP TO CHECK HIS CHOLESTEROL. I AM NOT QUALIFIED TO INTERPRET THE RESULTS. HE CAN PICK UP LABS SLIPS FOR LABS WE NEED AND HAVE THEM DRAWN AT THE SAME TIME. HE CAN HAVE HIS LAB RESULTS FAXED TO ME.

## 2016-11-08 ENCOUNTER — Ambulatory Visit: Payer: Self-pay | Admitting: Gastroenterology

## 2016-12-06 ENCOUNTER — Ambulatory Visit: Payer: Self-pay | Admitting: Gastroenterology

## 2017-01-11 ENCOUNTER — Encounter: Payer: Self-pay | Admitting: Gastroenterology

## 2017-01-11 ENCOUNTER — Telehealth: Payer: Self-pay | Admitting: Gastroenterology

## 2017-01-11 ENCOUNTER — Ambulatory Visit: Payer: Self-pay | Admitting: Gastroenterology

## 2017-01-11 NOTE — Telephone Encounter (Signed)
PT WAS A NO SHOW AND LETTER SENT  °

## 2017-01-11 NOTE — Progress Notes (Deleted)
   Subjective:    Patient ID: Thomas Ball, male    DOB: [birth year], 56 y.o.   MRN: [id number]  HPI    Review of Systems     Objective:   Physical Exam        Assessment & Plan:

## 2017-01-12 NOTE — Telephone Encounter (Signed)
REVIEWED-NO ADDITIONAL RECOMMENDATIONS. 

## 2017-02-08 ENCOUNTER — Telehealth: Payer: Self-pay

## 2017-02-08 ENCOUNTER — Ambulatory Visit: Payer: Self-pay | Admitting: Family Medicine

## 2017-02-08 NOTE — Telephone Encounter (Signed)
suboxone is only prescribed by specific providers registered to prescribe it. I do not prescribe it.   I can recommend some suboxone clinics and there are listings available online.   Laroy Apple, MD [facility] 02/08/2017, 12:02 PM

## 2017-02-08 NOTE — Telephone Encounter (Signed)
Pt notified of recommendation Verbalizes understanding 

## 2017-02-08 NOTE — Telephone Encounter (Signed)
Patient has appointment with Wendi Snipes today at 4:25- he is wanting a rx for suboxone strips. Patient states he was taking Hydro and buying it from people and now just buying whatever he can get his hands on. Nothing on med list. Patient states he wants to get off of them but every time he tries to stop he gets so nauseous. Wanting an rx for suboxone strips- patient is not at a pain clinic. Please advise.

## 2017-02-08 NOTE — Telephone Encounter (Signed)
lmtcb

## 2017-02-09 ENCOUNTER — Encounter: Payer: Self-pay | Admitting: Family Medicine

## 2017-04-13 ENCOUNTER — Ambulatory Visit: Payer: Self-pay | Admitting: Family Medicine

## 2017-04-15 ENCOUNTER — Encounter (HOSPITAL_COMMUNITY): Payer: Self-pay | Admitting: Emergency Medicine

## 2017-04-15 ENCOUNTER — Emergency Department (HOSPITAL_COMMUNITY): Payer: Self-pay

## 2017-04-15 ENCOUNTER — Emergency Department (HOSPITAL_COMMUNITY)
Admission: EM | Admit: 2017-04-15 | Discharge: 2017-04-15 | Disposition: A | Discharge status: Home / Self Care | Payer: Self-pay | Attending: Emergency Medicine | Admitting: Emergency Medicine

## 2017-04-15 DIAGNOSIS — F17210 Nicotine dependence, cigarettes, uncomplicated: Secondary | ICD-10-CM | POA: Insufficient documentation

## 2017-04-15 DIAGNOSIS — J181 Lobar pneumonia, unspecified organism: Secondary | ICD-10-CM | POA: Insufficient documentation

## 2017-04-15 DIAGNOSIS — J189 Pneumonia, unspecified organism: Secondary | ICD-10-CM

## 2017-04-15 DIAGNOSIS — Z85828 Personal history of other malignant neoplasm of skin: Secondary | ICD-10-CM | POA: Insufficient documentation

## 2017-04-15 DIAGNOSIS — R0602 Shortness of breath: Secondary | ICD-10-CM

## 2017-04-15 DIAGNOSIS — R072 Precordial pain: Secondary | ICD-10-CM

## 2017-04-15 LAB — CBC
HEMATOCRIT: 43.3 % (ref 39.0–52.0)
HEMOGLOBIN: 15 g/dL (ref 13.0–17.0)
MCH: 33.1 pg (ref 26.0–34.0)
MCHC: 34.6 g/dL (ref 30.0–36.0)
MCV: 95.6 fL (ref 78.0–100.0)
Platelets: 148 10*3/uL — ABNORMAL LOW (ref 150–400)
RBC: 4.53 MIL/uL (ref 4.22–5.81)
RDW: 12.8 % (ref 11.5–15.5)
WBC: 6.1 10*3/uL (ref 4.0–10.5)

## 2017-04-15 LAB — BASIC METABOLIC PANEL
ANION GAP: 7 (ref 5–15)
BUN: 11 mg/dL (ref 6–20)
CO2: 25 mmol/L (ref 22–32)
Calcium: 9 mg/dL (ref 8.9–10.3)
Chloride: 108 mmol/L (ref 101–111)
Creatinine, Ser: 0.75 mg/dL (ref 0.61–1.24)
GFR calc Af Amer: >60 mL/min (ref >60)
GLUCOSE: 160 mg/dL — AB (ref 65–99)
POTASSIUM: 3.9 mmol/L (ref 3.5–5.1)
SODIUM: 140 mmol/L (ref 135–145)

## 2017-04-15 LAB — TROPONIN I: Troponin I: <0.03 ng/mL (ref <0.03)

## 2017-04-15 MED ORDER — IPRATROPIUM-ALBUTEROL 0.5-2.5 (3) MG/3ML IN SOLN
3.0000 mL | Freq: Once | RESPIRATORY_TRACT | Status: AC
  Administered 2017-04-15: 3 mL via RESPIRATORY_TRACT
  Filled 2017-04-15: qty 3

## 2017-04-15 MED ORDER — DOXYCYCLINE HYCLATE 100 MG PO TABS
100.0000 mg | ORAL_TABLET | Freq: Once | ORAL | Status: AC
  Administered 2017-04-15: 100 mg via ORAL
  Filled 2017-04-15: qty 1

## 2017-04-15 MED ORDER — DOXYCYCLINE HYCLATE 100 MG PO CAPS: 100.0000 mg | ORAL_CAPSULE | Freq: Two times a day (BID) | ORAL | 0 refills | Status: AC

## 2017-04-15 NOTE — ED Notes (Signed)
Pt did well ambulating. O2 stayed at 98/99 the whole time. Pt said he felt dizzy and was SOB when he layed back down.

## 2017-04-15 NOTE — ED Triage Notes (Signed)
Patient c/o central chest pain that started 3 days ago with shortness of breath, diaphoresis, weakness, and dizziness. Patient reports cardiac ablations in past. Per patient also waking up from sleep because "he stops breathing." Patient does report occasional productive cough. Denies fevers.

## 2017-04-15 NOTE — ED Provider Notes (Signed)
Emergency Department Provider Note   I have reviewed the triage vital signs and the nursing notes.   HISTORY  Chief Complaint Chest Pain   HPI Thomas Ball is a 56 y.o. male with PMH of GERD, HCV, and SVT presents to the emergency department for evaluation of intermittent chest discomfort, dyspnea, generalized weakness, fatigue, lightheadedness over the past 3 days. Discomfort and dyspnea are worse with exertion. The patient is an active smoker and has been using his albuterol inhalers at home with no significant relief and dyspnea. He describes his chest discomfort as a pressure or tightness. No active chest discomfort at this time. No history of coronary artery disease. No radiation of symptoms. Denies fever or chills. No sick contacts. He has had a cough that has been mildly productive.    Past Medical History:  Diagnosis Date  . GERD (gastroesophageal reflux disease)   . HCV (hepatitis C virus) DR. ZACKS   AUG 2013 VIRAL LOAD UNDETECTABLE  . Pancreatitis, gallstone   . Skin cancer 1992   removed from side of head  . SVT (supraventricular tachycardia) [facility])     Patient Active Problem List   Diagnosis Date Noted  . BPH (benign prostatic hyperplasia) 10/07/2015  . Cough 09/17/2015  . EIC (epidermal inclusion cyst) 07/23/2015  . Tobacco abuse 07/02/2015  . SVT (supraventricular tachycardia) ([place])   . Intermittent palpitations 04/21/2014  . Bradycardia 04/21/2014  . Hypotension 04/21/2014  . Obesity 04/21/2014  . Chest pain 04/20/2014  . Cirrhosis of liver ([place]) 11/05/2013  . GERD (gastroesophageal reflux disease) 07/18/2012  . Screening for colorectal cancer 07/18/2012  . Pulmonary nodule 02/14/2012    Past Surgical History:  Procedure Laterality Date  . 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 CHOLECYSTECTOMY    . COLONOSCOPY  10/04/2012   SLF:ONE/8 SIMPLE ADENOMA, TICS, SML IH  . ESOPHAGOGASTRODUODENOSCOPY N/A 09/04/2014   Procedure:  ESOPHAGOGASTRODUODENOSCOPY (EGD);  Surgeon: Danie Binder, MD;  Location: [facility];  Service: Endoscopy;  Laterality: N/A;  145  . GALLBLADDER SURGERY    . JAW BROKEN    . LIPOMA REMOVALS     SEVERAL  . SUPRAVENTRICULAR TACHYCARDIA ABLATION N/A 06/22/2014   Procedure: SUPRAVENTRICULAR TACHYCARDIA ABLATION;  Surgeon: Evans Lance, MD;  Location: [facility] CATH LAB;  Service: Cardiovascular;  Laterality: N/A;    Current Outpatient Rx  . Order #: [id number] Class: Historical Med  . Order #: [id number] Class: Print  . Order #: [id number] Class: Normal  . Order #: [id number] Class: Print  . Order #: [id number] Class: Print  . Order #: [id number] Class: Normal  . Order #: [id number] Class: Print  . Order #: [id number] Class: Historical Med  . Order #: [id number] Class: Normal    Allergies Patient has no known allergies.  Family History  Problem Relation Age of Onset  . Emphysema Mother   . COPD Mother   . Early death Mother   . Hyperlipidemia Father   . Stroke Father   . Diabetes Father   . High Cholesterol Father   . Colon cancer Neg Hx     Social History Social History  Substance Use Topics  . Smoking status: Current Every Day Smoker    Packs/day: 1.00    Years: 28.00    Types: Cigarettes  . Smokeless tobacco: Never Used     Comment: had quit for 9 mos, picked up again 2 mos ago  . Alcohol use No    Review of Systems  Constitutional: No fever/chills. Positive generalized  weakness.  Eyes: No visual changes. ENT: No sore throat. Cardiovascular: Positive chest pain. Respiratory: Positive shortness of breath. Gastrointestinal: No abdominal pain.  No nausea, no vomiting.  No diarrhea.  No constipation. Genitourinary: Negative for dysuria. Musculoskeletal: Negative for back pain. Skin: Negative for rash. Neurological: Negative for headaches, focal weakness or numbness.  10-point ROS otherwise negative.  ____________________________________________   PHYSICAL EXAM:  VITAL  SIGNS: ED Triage Vitals  Enc Vitals Group     BP 04/15/17 1205 (!) 141/80     Pulse Rate 04/15/17 1205 70     Resp 04/15/17 1205 (!) 26     Temp 04/15/17 1205 97.7 F (36.5 C)     Temp Source 04/15/17 1205 Oral     SpO2 04/15/17 1205 93 %     Weight 04/15/17 1201 260 lb (117.9 kg)     Height 04/15/17 1201 5\' 5"  (1.651 m)     Pain Score 04/15/17 1201 3   Constitutional: Alert and oriented. Well appearing and in no acute distress. Eyes: Conjunctivae are normal.  Head: Atraumatic. Nose: No congestion/rhinnorhea. Mouth/Throat: Mucous membranes are moist.  Oropharynx non-erythematous. Neck: No stridor.   Cardiovascular: Normal rate, regular rhythm. Good peripheral circulation. Grossly normal heart sounds.   Respiratory: Mild increased respiratory effort.  No retractions. Lungs with faint, diffuse end-expiratory wheezing.  Gastrointestinal: Soft and nontender. No distention.  Musculoskeletal: No lower extremity tenderness nor edema. No gross deformities of extremities. Neurologic:  Normal speech and language. No gross focal neurologic deficits are appreciated.  Skin:  Skin is warm, dry and intact. No rash noted. Psychiatric: Mood and affect are normal. Speech and behavior are normal. ____________________________________________   LABS (all labs ordered are listed, but only abnormal results are displayed)  Labs Reviewed  BASIC METABOLIC PANEL - Abnormal; Notable for the following:       Result Value   Glucose, Bld 160 (*)    All other components within normal limits  CBC - Abnormal; Notable for the following:    Platelets 148 (*)    All other components within normal limits  TROPONIN I   ____________________________________________  EKG   EKG Interpretation  Date/Time:  Sunday April 15 2017 11:59:10 EDT Ventricular Rate:  71 PR Interval:  132 QRS Duration: 92 QT Interval:  410 QTC Calculation: 445 R Axis:   24 Text Interpretation:  Sinus rhythm with occasional Premature  ventricular complexes Otherwise normal ECG No STEMI.  Confirmed by Nanda Quinton [phone] on 04/15/2017 12:33:20 PM       ____________________________________________  RADIOLOGY  Dg Chest 2 View  Result Date: 04/15/2017 CLINICAL DATA:  56 year old male with chest pain and shortness of breath EXAM: CHEST  2 VIEW COMPARISON:  Prior chest x-ray 03/23/2017 FINDINGS: Interval development of right middle lobe airspace opacity which obscures the right cardiac margin. Otherwise, the cardiac and mediastinal contours remain unchanged. Stable benign calcified granuloma in the left mid lung. No pleural effusion or pneumothorax. No acute osseous abnormality. IMPRESSION: 1. New right middle lobe patchy airspace opacity compared to 03/23/2017. In the appropriate clinical setting, this is concerning for bronchopneumonia. Followup PA and lateral chest X-ray is recommended in 3-4 weeks following trial of antibiotic therapy to ensure resolution and exclude underlying malignancy. Electronically Signed   By: Jacqulynn Cadet M.D.   On: 04/15/2017 12:35    ____________________________________________   PROCEDURES  Procedure(s) performed:   Procedures  None ____________________________________________   INITIAL IMPRESSION / ASSESSMENT AND PLAN / ED COURSE  Pertinent labs &  imaging results that were available during my care of the patient were reviewed by me and considered in my medical decision making (see chart for details).  Patient presents to the emergency department for evaluation of several days of chest heaviness, cough, fatigue, dyspnea, diaphoresis. X-ray shows a right middle lobe infiltrate consistent with a bronchopneumonia. Clinically this correlates well with the patient's overall presentation. Considered ACS very carefully with relatively low suspicion for this diagnosis. Troponin pending. With 3 days of near constant symptoms have lower suspicion for ACS if troponin negative. Plan to start  antibiotics and give DuoNeb for likely chronic obstructive disease with smoking history although patient does not carry this diagnosis.   01:22 PM Patient's labs are largely unremarkable. Plan for ambulatory pulse ox and reassess after Duoneb.   02:08 PM Patient ambulated with normal O2 sat. Plan for discharge with abx and continued albuterol inh at home. Discussed importance of PCP follow up in the coming 1-2 days as well as return precautions to the ED.   At this time, I do not feel there is any life-threatening condition present. I have reviewed and discussed all results (EKG, imaging, lab, urine as appropriate), exam findings with patient. I have reviewed nursing notes and appropriate previous records.  I feel the patient is safe to be discharged home without further emergent workup. Discussed usual and customary return precautions. Patient and family (if present) verbalize understanding and are comfortable with this plan.  Patient will follow-up with their primary care provider. If they do not have a primary care provider, information for follow-up has been provided to them. All questions have been answered.  ____________________________________________  FINAL CLINICAL IMPRESSION(S) / ED DIAGNOSES  Final diagnoses:  Shortness of breath  Precordial chest pain  Community acquired pneumonia of right middle lobe of lung ([place])     MEDICATIONS GIVEN DURING THIS VISIT:  Medications  doxycycline (VIBRA-TABS) tablet 100 mg (not administered)  ipratropium-albuterol (DUONEB) 0.5-2.5 (3) MG/3ML nebulizer solution 3 mL (3 mLs Nebulization Given 04/15/17 1322)     NEW OUTPATIENT MEDICATIONS STARTED DURING THIS VISIT:  New Prescriptions   DOXYCYCLINE (VIBRAMYCIN) 100 MG CAPSULE    Take 1 capsule (100 mg total) by mouth 2 (two) times daily.     Note:  This document was prepared using Dragon voice recognition software and may include unintentional dictation errors.  Nanda Quinton,  MD Emergency Medicine    Long, [facility], MD 04/15/17 [id number]

## 2017-04-15 NOTE — Discharge Instructions (Signed)
We believe that your symptoms are caused today by pneumonia, an infection in your lung(s).  Fortunately you should start to improve quickly after taking your antibiotics.  Please take the full course of antibiotics as prescribed and drink plenty of fluids.   ° °Follow up with your doctor within 1-2 days.  If you develop any new or worsening symptoms, including but not limited to fever in spite of taking over-the-counter ibuprofen and/or Tylenol, persistent vomiting, worsening shortness of breath, or other symptoms that concern you, please return to the Emergency Department immediately.  ° ° °Pneumonia °Pneumonia is an infection of the lungs.  °CAUSES °Pneumonia may be caused by bacteria or a virus. Usually, these infections are caused by breathing infectious particles into the lungs (respiratory tract). °SIGNS AND SYMPTOMS  °Cough. °Fever. °Chest pain. °Increased rate of breathing. °Wheezing. °Mucus production. °DIAGNOSIS  °If you have the common symptoms of pneumonia, your health care provider will typically confirm the diagnosis with a chest X-ray. The X-ray will show an abnormality in the lung (pulmonary infiltrate) if you have pneumonia. Other tests of your blood, urine, or sputum may be done to find the specific cause of your pneumonia. Your health care provider may also do tests (blood gases or pulse oximetry) to see how well your lungs are working. °TREATMENT  °Some forms of pneumonia may be spread to other people when you cough or sneeze. You may be asked to wear a mask before and during your exam. Pneumonia that is caused by bacteria is treated with antibiotic medicine. Pneumonia that is caused by the influenza virus may be treated with an antiviral medicine. Most other viral infections must run their course. These infections will not respond to antibiotics.  °HOME CARE INSTRUCTIONS  °Cough suppressants may be used if you are losing too much rest. However, coughing protects you by clearing your lungs. You  should avoid using cough suppressants if you can. °Your health care provider may have prescribed medicine if he or she thinks your pneumonia is caused by bacteria or influenza. Finish your medicine even if you start to feel better. °Your health care provider may also prescribe an expectorant. This loosens the mucus to be coughed up. °Take medicines only as directed by your health care provider. °Do not smoke. Smoking is a common cause of bronchitis and can contribute to pneumonia. If you are a smoker and continue to smoke, your cough may last several weeks after your pneumonia has cleared. °A cold steam vaporizer or humidifier in your room or home may help loosen mucus. °Coughing is often worse at night. Sleeping in a semi-upright position in a recliner or using a couple pillows under your head will help with this. °Get rest as you feel it is needed. Your body will usually let you know when you need to rest. °PREVENTION °A pneumococcal shot (vaccine) is available to prevent a common bacterial cause of pneumonia. This is usually suggested for: °People over 65 years old. °Patients on chemotherapy. °People with chronic lung problems, such as bronchitis or emphysema. °People with immune system problems. °If you are over 65 or have a high risk condition, you may receive the pneumococcal vaccine if you have not received it before. In some countries, a routine influenza vaccine is also recommended. This vaccine can help prevent some cases of pneumonia. You may be offered the influenza vaccine as part of your care. °If you smoke, it is time to quit. You may receive instructions on how to stop smoking. Your   health care provider can provide medicines and counseling to help you quit. °SEEK MEDICAL CARE IF: °You have a fever. °SEEK IMMEDIATE MEDICAL CARE IF:  °Your illness becomes worse. This is especially true if you are elderly or weakened from any other disease. °You cannot control your cough with suppressants and are losing  sleep. °You begin coughing up blood. °You develop pain which is getting worse or is uncontrolled with medicines. °Any of the symptoms which initially brought you in for treatment are getting worse rather than better. °You develop shortness of breath or chest pain. °MAKE SURE YOU:  °Understand these instructions. °Will watch your condition. °Will get help right away if you are not doing well or get worse. °Document Released: 09/11/2005 Document Revised: 01/26/2014 Document Reviewed: 12/01/2010 °Exit[employer]® Patient Information ©2015 [employer]. This information is not intended to replace advice given to you by your health care provider. Make sure you discuss any questions you have with your health care provider. ° ° ° °

## 2017-04-17 ENCOUNTER — Ambulatory Visit (INDEPENDENT_AMBULATORY_CARE_PROVIDER_SITE_OTHER): Payer: Self-pay | Admitting: Family Medicine

## 2017-04-17 ENCOUNTER — Encounter: Payer: Self-pay | Admitting: Family Medicine

## 2017-04-17 VITALS — BP 129/82 | HR 72 | Temp 97.2°F | Ht 65.0 in | Wt 279.0 lb

## 2017-04-17 DIAGNOSIS — R05 Cough: Secondary | ICD-10-CM

## 2017-04-17 DIAGNOSIS — J189 Pneumonia, unspecified organism: Secondary | ICD-10-CM

## 2017-04-17 DIAGNOSIS — J181 Lobar pneumonia, unspecified organism: Secondary | ICD-10-CM

## 2017-04-17 DIAGNOSIS — R059 Cough, unspecified: Secondary | ICD-10-CM

## 2017-04-17 DIAGNOSIS — Z720 Tobacco use: Secondary | ICD-10-CM

## 2017-04-17 MED ORDER — BETAMETHASONE SOD PHOS & ACET 6 (3-3) MG/ML IJ SUSP
6.0000 mg | Freq: Once | INTRAMUSCULAR | Status: AC
  Administered 2017-04-17: 6 mg via INTRAMUSCULAR

## 2017-04-17 MED ORDER — AMOXICILLIN-POT CLAVULANATE 875-125 MG PO TABS: 1.0000 | ORAL_TABLET | Freq: Two times a day (BID) | ORAL | 0 refills | Status: DC

## 2017-04-17 MED ORDER — HYDROCODONE-HOMATROPINE 5-1.5 MG/5ML PO SYRP: 5.0000 mL | ORAL_SOLUTION | Freq: Four times a day (QID) | ORAL | 0 refills | Status: DC | PRN

## 2017-04-17 NOTE — Patient Instructions (Addendum)
Rest at home for the next week. I'd prefer you didn't get out of the home anymore than absolutely necessary. This is due to the risk of weakening your immune system and worsening the  Pneumonia  Take all of the antibiotic. You should take both doxycycline and the amoxicillin-clavulanate (Augmentin).  It was a pleasure to meet you today. thanks for coming in.  Best regards, Claretta Fraise M.D.

## 2017-04-17 NOTE — Progress Notes (Signed)
Subjective:  Patient ID: Thomas Ball, male    DOB: Sep 17, 1961  Age: 56 y.o. MRN: [id number]  CC: Pneumonia (pt here today after going to the ED at [facility] for pneumonia and he is requesting something for his cough which is worse at night and keeps him awake.)   HPI Thomas Ball presents for Recheck of a pneumonia first diagnosed in the emergency department the night before last. Report reviewed showing that he had a right middle lobe infiltrate. Wheezing was noted on exam by the ER physician. He was started on doxycycline. I believe he avoided a macrolide due to his past history of SVT. He's had an ablation for that. Patient's main concern today is that he is coughing so much she can't rest. It gets worse when he lays down although he is coughing all day long. He is resting and he is getting exhausted. 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  Depression screen [facility] 2/9 04/17/2017 10/18/2015 10/07/2015  Decreased Interest 0 0 0  Down, Depressed, Hopeless 0 0 0  PHQ - 2 Score 0 0 0    History Thomas Ball has a past medical history of GERD (gastroesophageal reflux disease); HCV (hepatitis C virus) (DR. ZACKS); Pancreatitis, gallstone; Skin cancer (1992); and SVT (supraventricular tachycardia) ([place]).   He has a past surgical history that includes JAW BROKEN; Cholecystectomy; LIPOMA REMOVALS; Colonoscopy (10/04/2012); Ablation (06-22-14); supraventricular tachycardia ablation (N/A, 06/22/2014); Esophagogastroduodenoscopy (N/A, 09/04/2014); and Gallbladder surgery.   His family history includes COPD in his mother; Diabetes in his father; Early death in his mother; Emphysema in his mother; High Cholesterol in his father; Hyperlipidemia in his father; Stroke in his father.He reports that he has been smoking Cigarettes.  He has a 28.00 pack-year smoking history. He has never used smokeless tobacco. He reports that he does not drink alcohol or use  drugs.    ROS Review of Systems  Constitutional: Positive for fever. Negative for chills, diaphoresis and unexpected weight change.  HENT: Negative for congestion, hearing loss, rhinorrhea and sore throat.   Eyes: Negative for visual disturbance.  Respiratory: Positive for cough, shortness of breath and wheezing.   Cardiovascular: Negative for chest pain.  Gastrointestinal: Negative for abdominal pain, constipation and diarrhea.  Genitourinary: Negative for dysuria and flank pain.  Musculoskeletal: Negative for arthralgias and joint swelling.  Skin: Negative for rash.  Neurological: Negative for dizziness and headaches.  Psychiatric/Behavioral: Negative for dysphoric mood and sleep disturbance.    Objective:  BP 129/82   Pulse 72   Temp (!) 97.2 F (36.2 C) (Oral)   Ht 5\' 5"  (1.651 m)   Wt 279 lb (126.6 kg)   SpO2 96%   BMI 46.43 kg/m   BP Readings from Last 3 Encounters:  04/17/17 129/82  04/15/17 123/65  08/14/16 131/72    Wt Readings from Last 3 Encounters:  04/17/17 279 lb (126.6 kg)  04/15/17 260 lb (117.9 kg)  08/14/16 250 lb (113.4 kg)     Physical Exam  Constitutional: He is oriented to person, place, and time. He appears well-developed and well-nourished. No distress.  Obese  HENT:  Head: Normocephalic and atraumatic.  Right Ear: External ear normal.  Left Ear: External ear normal.  Nose: Nose normal.  Mouth/Throat: Oropharynx is clear and moist.  Eyes: Pupils are equal, round, and reactive to light. Conjunctivae and EOM are normal.  Neck: Normal range of motion. Neck supple. No thyromegaly present.  Cardiovascular: Normal rate, regular rhythm and normal heart sounds.   No murmur heard. Pulmonary/Chest: Effort normal. No respiratory distress. He has wheezes (faint throughout.). He has no rales.  Abdominal: Soft. Bowel sounds are normal. He exhibits no distension. There is no tenderness.  Lymphadenopathy:    He has no cervical adenopathy.   Neurological: He is alert and oriented to person, place, and time. He has normal reflexes.  Skin: Skin is warm and dry.  Psychiatric: He has a normal mood and affect. His behavior is normal. Judgment and thought content normal.    Chest x-ray review confirms right middle lobe infiltrate  Assessment & Plan:   Thomas Ball was seen today for pneumonia.  Diagnoses and all orders for this visit:  Community acquired pneumonia of right middle lobe of lung ([place])  Tobacco abuse  Cough       I have discontinued Thomas Ball sulfamethoxazole-trimethoprim, tamsulosin, cephALEXin, guaiFENesin-codeine, azithromycin, benzonatate, and albuterol. I am also having him maintain his omeprazole, doxycycline, and dextromethorphan-guaiFENesin.  Allergies as of 04/17/2017   No Known Allergies     Medication List       Accurate as of 04/17/17  9:30 AM. Always use your most recent med list.          dextromethorphan-guaiFENesin 30-600 MG 12hr tablet Commonly known as:  MUCINEX DM Take 1 tablet by mouth 2 (two) times daily.   doxycycline 100 MG capsule Commonly known as:  VIBRAMYCIN Take 1 capsule (100 mg total) by mouth 2 (two) times daily.   omeprazole 20 MG capsule Commonly known as:  PRILOSEC 1 po bid 30 minutes before meals for 3 mos then once daily FOREVER        Follow-up: Return in about 2 weeks (around 05/01/2017) for Pneumonia, with Dr. Wendi Snipes.  Claretta Fraise, M.D.

## 2017-04-28 ENCOUNTER — Emergency Department (HOSPITAL_COMMUNITY): Payer: Self-pay

## 2017-04-28 ENCOUNTER — Emergency Department (HOSPITAL_COMMUNITY)
Admission: EM | Admit: 2017-04-28 | Discharge: 2017-04-28 | Disposition: A | Discharge status: Home / Self Care | Payer: Self-pay | Attending: Emergency Medicine | Admitting: Emergency Medicine

## 2017-04-28 ENCOUNTER — Encounter (HOSPITAL_COMMUNITY): Payer: Self-pay

## 2017-04-28 ENCOUNTER — Other Ambulatory Visit: Payer: Self-pay

## 2017-04-28 DIAGNOSIS — Z85828 Personal history of other malignant neoplasm of skin: Secondary | ICD-10-CM | POA: Insufficient documentation

## 2017-04-28 DIAGNOSIS — Z79899 Other long term (current) drug therapy: Secondary | ICD-10-CM | POA: Insufficient documentation

## 2017-04-28 DIAGNOSIS — R0602 Shortness of breath: Secondary | ICD-10-CM | POA: Insufficient documentation

## 2017-04-28 DIAGNOSIS — F17210 Nicotine dependence, cigarettes, uncomplicated: Secondary | ICD-10-CM | POA: Insufficient documentation

## 2017-04-28 DIAGNOSIS — R062 Wheezing: Secondary | ICD-10-CM | POA: Insufficient documentation

## 2017-04-28 DIAGNOSIS — R05 Cough: Secondary | ICD-10-CM | POA: Insufficient documentation

## 2017-04-28 DIAGNOSIS — R072 Precordial pain: Secondary | ICD-10-CM | POA: Insufficient documentation

## 2017-04-28 LAB — D-DIMER, QUANTITATIVE: D-Dimer, Quant: 0.62 ug/mL-FEU — ABNORMAL HIGH (ref 0.00–0.50)

## 2017-04-28 LAB — BRAIN NATRIURETIC PEPTIDE: B NATRIURETIC PEPTIDE 5: 116 pg/mL — AB (ref 0.0–100.0)

## 2017-04-28 LAB — COMPREHENSIVE METABOLIC PANEL
ALT: 29 U/L (ref 17–63)
ANION GAP: 9 (ref 5–15)
AST: 26 U/L (ref 15–41)
Albumin: 3.7 g/dL (ref 3.5–5.0)
Alkaline Phosphatase: 20 U/L — ABNORMAL LOW (ref 38–126)
BILIRUBIN TOTAL: 1.3 mg/dL — AB (ref 0.3–1.2)
BUN: 14 mg/dL (ref 6–20)
CHLORIDE: 105 mmol/L (ref 101–111)
CO2: 26 mmol/L (ref 22–32)
Calcium: 9 mg/dL (ref 8.9–10.3)
Creatinine, Ser: 0.9 mg/dL (ref 0.61–1.24)
Glucose, Bld: 147 mg/dL — ABNORMAL HIGH (ref 65–99)
POTASSIUM: 3.6 mmol/L (ref 3.5–5.1)
Sodium: 140 mmol/L (ref 135–145)
TOTAL PROTEIN: 6.7 g/dL (ref 6.5–8.1)

## 2017-04-28 LAB — CBC
HEMATOCRIT: 42.2 % (ref 39.0–52.0)
Hemoglobin: 14.7 g/dL (ref 13.0–17.0)
MCH: 33.1 pg (ref 26.0–34.0)
MCHC: 34.8 g/dL (ref 30.0–36.0)
MCV: 95 fL (ref 78.0–100.0)
PLATELETS: 145 10*3/uL — AB (ref 150–400)
RBC: 4.44 MIL/uL (ref 4.22–5.81)
RDW: 12.7 % (ref 11.5–15.5)
WBC: 6.8 10*3/uL (ref 4.0–10.5)

## 2017-04-28 LAB — TROPONIN I

## 2017-04-28 MED ORDER — IPRATROPIUM-ALBUTEROL 0.5-2.5 (3) MG/3ML IN SOLN
3.0000 mL | Freq: Once | RESPIRATORY_TRACT | Status: AC
  Administered 2017-04-28: 3 mL via RESPIRATORY_TRACT
  Filled 2017-04-28: qty 3

## 2017-04-28 MED ORDER — IOPAMIDOL (ISOVUE-370) INJECTION 76%
100.0000 mL | Freq: Once | INTRAVENOUS | Status: AC | PRN
  Administered 2017-04-28: 100 mL via INTRAVENOUS

## 2017-04-28 MED ORDER — PREDNISONE 50 MG PO TABS
60.0000 mg | ORAL_TABLET | Freq: Once | ORAL | Status: AC
  Administered 2017-04-28: 19:00:00 60 mg via ORAL
  Filled 2017-04-28: qty 1

## 2017-04-28 MED ORDER — IPRATROPIUM-ALBUTEROL 0.5-2.5 (3) MG/3ML IN SOLN
3.0000 mL | RESPIRATORY_TRACT | Status: DC
  Administered 2017-04-28: 3 mL via RESPIRATORY_TRACT
  Filled 2017-04-28: qty 3

## 2017-04-28 NOTE — ED Provider Notes (Signed)
[facility] DEPT Provider Note   CSN: [id number] Arrival date & time: 04/28/17  1848     History   Chief Complaint Chief Complaint  Patient presents with  . Shortness of Breath  . Chest Pain    HPI Thomas Ball is a 56 y.o. male.   Shortness of Breath  This is a new problem. The problem occurs continuously.The current episode started more than 2 days ago. The problem has been gradually worsening. Associated symptoms include cough and wheezing. Pertinent negatives include no fever and no leg swelling. He has tried beta-agonist inhalers for the symptoms. The treatment provided mild relief. He has had prior hospitalizations. He has had prior ED visits.    Past Medical History:  Diagnosis Date  . GERD (gastroesophageal reflux disease)   . HCV (hepatitis C virus) DR. ZACKS   AUG 2013 VIRAL LOAD UNDETECTABLE  . Pancreatitis, gallstone   . Skin cancer 1992   removed from side of head  . SVT (supraventricular tachycardia) [facility])     Patient Active Problem List   Diagnosis Date Noted  . BPH (benign prostatic hyperplasia) 10/07/2015  . Cough 09/17/2015  . EIC (epidermal inclusion cyst) 07/23/2015  . Tobacco abuse 07/02/2015  . SVT (supraventricular tachycardia) ([place])   . Intermittent palpitations 04/21/2014  . Bradycardia 04/21/2014  . Hypotension 04/21/2014  . Obesity 04/21/2014  . Chest pain 04/20/2014  . Cirrhosis of liver ([place]) 11/05/2013  . GERD (gastroesophageal reflux disease) 07/18/2012  . Screening for colorectal cancer 07/18/2012  . Pulmonary nodule 02/14/2012    Past Surgical History:  Procedure Laterality Date  . 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 CHOLECYSTECTOMY    . COLONOSCOPY  10/04/2012   SLF:ONE/8 SIMPLE ADENOMA, TICS, SML IH  . ESOPHAGOGASTRODUODENOSCOPY N/A 09/04/2014   Procedure: ESOPHAGOGASTRODUODENOSCOPY (EGD);  Surgeon: Danie Binder, MD;  Location: [facility] END[facility];  Service: Endoscopy;  Laterality: N/A;  145  . GALLBLADDER  SURGERY    . JAW BROKEN    . LIPOMA REMOVALS     SEVERAL  . SUPRAVENTRICULAR TACHYCARDIA ABLATION N/A 06/22/2014   Procedure: SUPRAVENTRICULAR TACHYCARDIA ABLATION;  Surgeon: Evans Lance, MD;  Location: [facility] CATH LAB;  Service: Cardiovascular;  Laterality: N/A;       Home Medications    Prior to Admission medications   Medication Sig Start Date End Date Taking? Authorizing Provider  omeprazole (PRILOSEC) 20 MG capsule 1 po bid 30 minutes before meals for 3 mos then once daily FOREVER Patient taking differently: Take 20 mg by mouth 2 (two) times daily before a meal.  09/07/14  Yes Fields, Marga Melnick, MD    Family History Family History  Problem Relation Age of Onset  . Emphysema Mother   . COPD Mother   . Early death Mother   . Hyperlipidemia Father   . Stroke Father   . Diabetes Father   . High Cholesterol Father   . Colon cancer Neg Hx     Social History Social History  Substance Use Topics  . Smoking status: Current Every Day Smoker    Packs/day: 1.00    Years: 28.00    Types: Cigarettes  . Smokeless tobacco: Never Used     Comment: had quit for 9 mos, picked up again 2 mos ago  . Alcohol use No     Allergies   Patient has no known allergies.   Review of Systems Review of Systems  Constitutional: Negative for fever.  Respiratory: Positive  for cough, shortness of breath and wheezing.   Cardiovascular: Negative for leg swelling.  All other systems reviewed and are negative.    Physical Exam Updated Vital Signs BP 140/90 (BP Location: Left Arm)   Pulse 76   Temp 98.3 F (36.8 C) (Oral)   Resp 18   Ht 5\' 5"  (1.651 m)   Wt 122.5 kg (270 lb)   SpO2 96%   BMI 44.93 kg/m   Physical Exam  Constitutional: He is oriented to person, place, and time. He appears well-developed and well-nourished.  HENT:  Head: Normocephalic and atraumatic.  Eyes: Conjunctivae and EOM are normal.  Neck: Normal range of motion.  Cardiovascular: Normal rate.     Pulmonary/Chest: Effort normal. No respiratory distress. He has wheezes.  Abdominal: Soft. He exhibits no distension.  Musculoskeletal: Normal range of motion. He exhibits no edema or deformity.  Neurological: He is alert and oriented to person, place, and time. No cranial nerve deficit. Coordination normal.  Skin: Skin is warm and dry.  Nursing note and vitals reviewed.     ED Treatments / Results  Labs (all labs ordered are listed, but only abnormal results are displayed) Labs Reviewed  CBC - Abnormal; Notable for the following:       Result Value   Platelets 145 (*)    All other components within normal limits  COMPREHENSIVE METABOLIC PANEL - Abnormal; Notable for the following:    Glucose, Bld 147 (*)    Alkaline Phosphatase 20 (*)    Total Bilirubin 1.3 (*)    All other components within normal limits  BRAIN NATRIURETIC PEPTIDE - Abnormal; Notable for the following:    B Natriuretic Peptide 116.0 (*)    All other components within normal limits  D-DIMER, QUANTITATIVE (NOT AT [facility]) - Abnormal; Notable for the following:    D-Dimer, Quant 0.62 (*)    All other components within normal limits  TROPONIN I  TROPONIN I    EKG  EKG Interpretation  Date/Time:  Saturday April 28 2017 18:55:04 EDT Ventricular Rate:  78 PR Interval:    QRS Duration: 89 QT Interval:  398 QTC Calculation: 454 R Axis:   33 Text Interpretation:  Sinus rhythm Multiple premature complexes, vent & supraven Confirmed by Merrily Pew [phone]) on 04/28/2017 7:59:54 PM       Radiology Dg Chest 2 View  Result Date: 04/28/2017 CLINICAL DATA:  SOB, Reports of shortness of breath and cough x2 days. Recently dx with pneumonia. Also reports of chest pain that started today HISTORY OF GERD, HCV, CANCER, SVT, PANCREATITIS EXAM: CHEST  2 VIEW COMPARISON:  04/15/2017 FINDINGS: The heart is enlarged. There is minimal bibasilar atelectasis. No focal consolidations or pleural effusions are identified. No  pulmonary edema. Left upper lobe granuloma. IMPRESSION: Minimal bibasilar atelectasis. Improved aeration in the right middle lobe. Electronically Signed   By: Nolon Nations M.D.   On: 04/28/2017 20:07   Ct Angio Chest Pe W And/or Wo Contrast  Result Date: 04/28/2017 CLINICAL DATA:  56 year old male with cough and shortness of breath for 2 days. Chest pain starting today. Abnormal D-dimer. EXAM: CT ANGIOGRAPHY CHEST WITH CONTRAST TECHNIQUE: Multidetector CT imaging of the chest was performed using the standard protocol during bolus administration of intravenous contrast. Multiplanar CT image reconstructions and MIPs were obtained to evaluate the vascular anatomy. CONTRAST:  100 mL Isovue 370 COMPARISON:  Chest radiographs 1946 hours today and earlier. Chest CTs 12/10/2013 and earlier. FINDINGS: Cardiovascular: Adequate contrast bolus timing in  the pulmonary arterial tree. Mild lower lobe respiratory motion. No focal filling defect identified in the pulmonary arteries to suggest acute pulmonary embolism. Mild cardiomegaly. No pericardial effusion. Negative visualized aorta. Mediastinum/Nodes: Negative.  No lymphadenopathy. Lungs/Pleura: Major airways are patent. Lower lung volumes compared to 2015. Lateral left upper lobe calcified granuloma with mild adjacent peribronchial opacity, unchanged since 2014. No acute pulmonary opacity. No pleural effusion. Upper Abdomen: Stable visualized liver. The gallbladder appears to be surgically absent. Negative visible pancreas, spleen, adrenal glands, kidneys, and bowel in the upper abdomen. Musculoskeletal: No acute osseous abnormality identified. Review of the MIP images confirms the above findings. IMPRESSION: Negative for acute pulmonary embolus. No acute findings in the chest. Electronically Signed   By: Genevie Ann M.D.   On: 04/28/2017 20:55    Procedures Procedures (including critical care time)  Medications Ordered in ED Medications  ipratropium-albuterol  (DUONEB) 0.5-2.5 (3) MG/3ML nebulizer solution 3 mL (3 mLs Nebulization Given 04/28/17 1928)  predniSONE (DELTASONE) tablet 60 mg (60 mg Oral Given 04/28/17 1923)  iopamidol (ISOVUE-370) 76 % injection 100 mL (100 mLs Intravenous Contrast Given 04/28/17 2032)     Initial Impression / Assessment and Plan / ED Course  I have reviewed the triage vital signs and the nursing notes.  Pertinent labs & imaging results that were available during my care of the patient were reviewed by me and considered in my medical decision making (see chart for details).  Clinical Course as of Apr 29 2340  Sat Apr 28, 2017  2020 On reevaluation, breathing comfortably (RR 17) no hypoxia, tachycardia. D dimer slightly elevated, will get CTA. Still with some subjective dyspnea, so will give another duoneb.   [JM]    Clinical Course User Index [JM] Charlayne Vultaggio, Corene Cornea, MD    On reeval, significantly improved. Will get delta troponins. Rest of workup unremarkable. Will follow up with pulm/cards.   Final Clinical Impressions(s) / ED Diagnoses   Final diagnoses:  Shortness of breath  Precordial pain    New Prescriptions Discharge Medication List as of 04/28/2017 11:14 PM       Yareliz Thorstenson, Corene Cornea, MD 04/29/17 2341

## 2017-04-28 NOTE — ED Triage Notes (Signed)
Reports of shortness of breath and cough x2 days. Recently dx with pneumonia. Also reports of chest pain that started today.

## 2017-04-28 NOTE — Discharge Instructions (Signed)
Use albuterol inhaler or nebulizer that she have every 6 hours for the next 7 days. Make an appointment to follow-up with your regular Dr. for the shortness of breath. Would recommend follow-up with cardiology and make an appointment, cardiology [place] information provided. Return for any new or worse symptoms.

## 2017-04-28 NOTE — ED Provider Notes (Signed)
Patient turned over to me by Dr. Dolly Rias. Patient's follow-up troponin was negative. CT angiogram of the chest without evidence of PE pulmonary embolism or pneumothorax or pneumonia. Patient does have some shortness of breath but oxygen saturation saturations are around 96%. Patient does use albuterol inhaler nebulizer at home but he has not been using it on a regular basis. Recommend they start using it every 6 hours. Patient's had cardiac ablation in the past but has not followed up with cardiology. No known history of coronary artery disease. Based on the 2 negative troponins acute cardiac event is unlikely. Patient given referral to cardiology. Patient okay with plan patient stable for discharge home.   Fredia Sorrow, MD 04/28/17 [phone]

## 2017-05-02 ENCOUNTER — Ambulatory Visit: Payer: Self-pay | Admitting: Family Medicine

## 2017-05-06 ENCOUNTER — Encounter (HOSPITAL_COMMUNITY): Payer: Self-pay | Admitting: Emergency Medicine

## 2017-05-06 ENCOUNTER — Emergency Department (HOSPITAL_COMMUNITY): Payer: Self-pay

## 2017-05-06 ENCOUNTER — Emergency Department (HOSPITAL_COMMUNITY)
Admission: EM | Admit: 2017-05-06 | Discharge: 2017-05-06 | Disposition: A | Discharge status: Home / Self Care | Payer: Self-pay | Attending: Emergency Medicine | Admitting: Emergency Medicine

## 2017-05-06 DIAGNOSIS — R0602 Shortness of breath: Secondary | ICD-10-CM | POA: Insufficient documentation

## 2017-05-06 DIAGNOSIS — R05 Cough: Secondary | ICD-10-CM | POA: Insufficient documentation

## 2017-05-06 DIAGNOSIS — F17210 Nicotine dependence, cigarettes, uncomplicated: Secondary | ICD-10-CM | POA: Insufficient documentation

## 2017-05-06 DIAGNOSIS — R42 Dizziness and giddiness: Secondary | ICD-10-CM | POA: Insufficient documentation

## 2017-05-06 DIAGNOSIS — Z79899 Other long term (current) drug therapy: Secondary | ICD-10-CM | POA: Insufficient documentation

## 2017-05-06 DIAGNOSIS — Z85828 Personal history of other malignant neoplasm of skin: Secondary | ICD-10-CM | POA: Insufficient documentation

## 2017-05-06 DIAGNOSIS — R110 Nausea: Secondary | ICD-10-CM | POA: Insufficient documentation

## 2017-05-06 LAB — BASIC METABOLIC PANEL
ANION GAP: 7 (ref 5–15)
BUN: 12 mg/dL (ref 6–20)
CALCIUM: 8.7 mg/dL — AB (ref 8.9–10.3)
CO2: 24 mmol/L (ref 22–32)
CREATININE: 0.82 mg/dL (ref 0.61–1.24)
Chloride: 103 mmol/L (ref 101–111)
GFR calc Af Amer: >60 mL/min (ref >60)
GLUCOSE: 170 mg/dL — AB (ref 65–99)
Potassium: 3.7 mmol/L (ref 3.5–5.1)
Sodium: 134 mmol/L — ABNORMAL LOW (ref 135–145)

## 2017-05-06 LAB — LIPASE, BLOOD: LIPASE: 20 U/L (ref 11–51)

## 2017-05-06 LAB — DIFFERENTIAL
Basophils Absolute: 0 10*3/uL (ref 0.0–0.1)
Basophils Relative: 0 %
EOS PCT: 2 %
Eosinophils Absolute: 0.1 10*3/uL (ref 0.0–0.7)
Lymphocytes Relative: 13 %
Lymphs Abs: 0.8 10*3/uL (ref 0.7–4.0)
MONO ABS: 0.4 10*3/uL (ref 0.1–1.0)
MONOS PCT: 7 %
NEUTROS ABS: 4.6 10*3/uL (ref 1.7–7.7)
Neutrophils Relative %: 78 %

## 2017-05-06 LAB — HEPATIC FUNCTION PANEL
ALBUMIN: 3.7 g/dL (ref 3.5–5.0)
ALK PHOS: 19 U/L — AB (ref 38–126)
ALT: 24 U/L (ref 17–63)
AST: 25 U/L (ref 15–41)
BILIRUBIN DIRECT: 0.2 mg/dL (ref 0.1–0.5)
BILIRUBIN TOTAL: 1.3 mg/dL — AB (ref 0.3–1.2)
Indirect Bilirubin: 1.1 mg/dL — ABNORMAL HIGH (ref 0.3–0.9)
Total Protein: 6.4 g/dL — ABNORMAL LOW (ref 6.5–8.1)

## 2017-05-06 LAB — CBC
HCT: 42.2 % (ref 39.0–52.0)
HEMOGLOBIN: 14.9 g/dL (ref 13.0–17.0)
MCH: 33.6 pg (ref 26.0–34.0)
MCHC: 35.3 g/dL (ref 30.0–36.0)
MCV: 95.3 fL (ref 78.0–100.0)
PLATELETS: 128 10*3/uL — AB (ref 150–400)
RBC: 4.43 MIL/uL (ref 4.22–5.81)
RDW: 12.9 % (ref 11.5–15.5)
WBC: 6 10*3/uL (ref 4.0–10.5)

## 2017-05-06 LAB — BRAIN NATRIURETIC PEPTIDE: B NATRIURETIC PEPTIDE 5: 177 pg/mL — AB (ref 0.0–100.0)

## 2017-05-06 LAB — I-STAT TROPONIN, ED: TROPONIN I, POC: 0.01 ng/mL (ref 0.00–0.08)

## 2017-05-06 LAB — TROPONIN I
Troponin I: <0.03 ng/mL (ref <0.03)
Troponin I: <0.03 ng/mL (ref <0.03)

## 2017-05-06 MED ORDER — ALBUTEROL SULFATE HFA 108 (90 BASE) MCG/ACT IN AERS: 2.0000 | INHALATION_SPRAY | RESPIRATORY_TRACT | 0 refills | Status: DC | PRN

## 2017-05-06 MED ORDER — ALBUTEROL (5 MG/ML) CONTINUOUS INHALATION SOLN
10.0000 mg/h | INHALATION_SOLUTION | Freq: Once | RESPIRATORY_TRACT | Status: AC
  Administered 2017-05-06: 10 mg/h via RESPIRATORY_TRACT
  Filled 2017-05-06: qty 20

## 2017-05-06 MED ORDER — METHYLPREDNISOLONE SODIUM SUCC 125 MG IJ SOLR
125.0000 mg | Freq: Once | INTRAMUSCULAR | Status: AC
  Administered 2017-05-06: 125 mg via INTRAVENOUS
  Filled 2017-05-06: qty 2

## 2017-05-06 MED ORDER — IPRATROPIUM BROMIDE 0.02 % IN SOLN
1.0000 mg | Freq: Once | RESPIRATORY_TRACT | Status: AC
  Administered 2017-05-06: 1 mg via RESPIRATORY_TRACT
  Filled 2017-05-06: qty 5

## 2017-05-06 MED ORDER — PREDNISONE 20 MG PO TABS: 40.0000 mg | ORAL_TABLET | Freq: Every day | ORAL | 0 refills | Status: DC

## 2017-05-06 NOTE — ED Notes (Signed)
While ambulating pt stated "this is not the normal way I breathe", Oxygen 95 Percent was lowest noted.

## 2017-05-06 NOTE — ED Triage Notes (Signed)
Patient c/o mid-sternal to left sided chest pain x1 week. Per patient was seen here in ED 1 week ago for pain and told to follow-up with cardiologist appoint is 8/24. Patient states chest pain has increased. Per patient shortness of breath and light headedness with some nausea yesterday. Patient does state congested cough, nonproductive. Unsure of any fevers. Per patient hx of cardiac ablation due to arrhythmia in past.

## 2017-05-06 NOTE — Discharge Instructions (Signed)
Take the prescriptions as directed.  Use your albuterol inhaler (2 to 4 puffs) or your albuterol nebulizer (1 unit dose) every 4 hours for the next 7 days, then as needed for cough, wheezing, or shortness of breath.  Call your regular medical doctor tomorrow morning to schedule a follow up appointment within the next 2 to 3 days. Call your Pulmonologist and Cardiologist tomorrow to schedule a follow up appointment within the next week. Return to the Emergency Department immediately sooner if worsening.

## 2017-05-06 NOTE — ED Provider Notes (Signed)
[facility] DEPT Provider Note   CSN: [id number] Arrival date & time: 05/06/17  0855     History   Chief Complaint Chief Complaint  Patient presents with  . Chest Pain    HPI Thomas Ball is a 56 y.o. male.   Chest Pain      Pt was seen at 0920. Per pt, c/o gradual onset and persistence of constant mid-sternal chest "pain" for the past 10 days. Has been associated with SOB, cough, nausea, and lightheadedness. Pt was evaluated in the ED last week for his symptoms with extensive, reassuring workup. Denies any change in his symptoms. Denies new symptoms. Denies palpitations, no abd pain, no vomiting/diarrhea, no back pain, no fevers, no focal motor weakness, no tingling/numbness in extremities.     Past Medical History:  Diagnosis Date  . GERD (gastroesophageal reflux disease)   . HCV (hepatitis C virus) DR. ZACKS   AUG 2013 VIRAL LOAD UNDETECTABLE  . Pancreatitis, gallstone   . Skin cancer 1992   removed from side of head  . SVT (supraventricular tachycardia) [facility])     Patient Active Problem List   Diagnosis Date Noted  . BPH (benign prostatic hyperplasia) 10/07/2015  . Cough 09/17/2015  . EIC (epidermal inclusion cyst) 07/23/2015  . Tobacco abuse 07/02/2015  . SVT (supraventricular tachycardia) ([place])   . Intermittent palpitations 04/21/2014  . Bradycardia 04/21/2014  . Hypotension 04/21/2014  . Obesity 04/21/2014  . Chest pain 04/20/2014  . Cirrhosis of liver ([place]) 11/05/2013  . GERD (gastroesophageal reflux disease) 07/18/2012  . Screening for colorectal cancer 07/18/2012  . Pulmonary nodule 02/14/2012    Past Surgical History:  Procedure Laterality Date  . 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 CHOLECYSTECTOMY    . COLONOSCOPY  10/04/2012   SLF:ONE/8 SIMPLE ADENOMA, TICS, SML IH  . ESOPHAGOGASTRODUODENOSCOPY N/A 09/04/2014   Procedure: ESOPHAGOGASTRODUODENOSCOPY (EGD);  Surgeon: Danie Binder, MD;  Location: [facility] ENDO [facility];  Service:  Endoscopy;  Laterality: N/A;  145  . GALLBLADDER SURGERY    . JAW BROKEN    . LIPOMA REMOVALS     SEVERAL  . SUPRAVENTRICULAR TACHYCARDIA ABLATION N/A 06/22/2014   Procedure: SUPRAVENTRICULAR TACHYCARDIA ABLATION;  Surgeon: Evans Lance, MD;  Location: [facility] CATH LAB;  Service: Cardiovascular;  Laterality: N/A;       Home Medications    Prior to Admission medications   Medication Sig Start Date End Date Taking? Authorizing Provider  omeprazole (PRILOSEC) 20 MG capsule 1 po bid 30 minutes before meals for 3 mos then once daily FOREVER Patient taking differently: Take 20 mg by mouth 2 (two) times daily before a meal.  09/07/14   Fields, Marga Melnick, MD    Family History Family History  Problem Relation Age of Onset  . Emphysema Mother   . COPD Mother   . Early death Mother   . Hyperlipidemia Father   . Stroke Father   . Diabetes Father   . High Cholesterol Father   . Colon cancer Neg Hx     Social History Social History  Substance Use Topics  . Smoking status: Current Every Day Smoker    Packs/day: 1.00    Years: 28.00    Types: Cigarettes  . Smokeless tobacco: Never Used     Comment: had quit for 9 mos, picked up again 2 mos ago  . Alcohol use No     Allergies   Patient has no known allergies.   Review of  Systems Review of Systems  Cardiovascular: Positive for chest pain.  ROS: Statement: All systems negative except as marked or noted in the HPI; Constitutional: Negative for fever and chills. ; ; Eyes: Negative for eye pain, redness and discharge. ; ; ENMT: Negative for ear pain, hoarseness, nasal congestion, sinus pressure and sore throat. ; ; Cardiovascular: +CP. Negative for palpitations, diaphoresis, and peripheral edema. ; ; Respiratory: +SOB, cough. Negative for wheezing and stridor. ; ; Gastrointestinal: +nausea. Negative for vomiting, diarrhea, abdominal pain, blood in stool, hematemesis, jaundice and rectal bleeding. . ; ; Genitourinary: Negative for dysuria,  flank pain and hematuria. ; ; Musculoskeletal: Negative for back pain and neck pain. Negative for swelling and trauma.; ; Skin: Negative for pruritus, rash, abrasions, blisters, bruising and skin lesion.; ; Neuro: +lightheadedness. Negative for headache, and neck stiffness. Negative for weakness, altered level of consciousness, altered mental status, extremity weakness, paresthesias, involuntary movement, seizure and syncope.        Physical Exam Updated Vital Signs BP (!) 138/91 (BP Location: Left Arm)   Pulse 61   Temp 97.8 F (36.6 C) (Oral)   Resp 18   Ht 5\' 5"  (1.651 m)   Wt 122.5 kg (270 lb)   SpO2 98%   BMI 44.93 kg/m    09:46:15 Orthostatic Vital Signs CB  Orthostatic Lying   BP- Lying: 122/89  Pulse- Lying: 59      Orthostatic Sitting  BP- Sitting: 135/86  Pulse- Sitting: 73      Orthostatic Standing at 0 minutes  BP- Standing at 0 minutes:  131/91  Pulse- Standing at 0 minutes: 63   Patient Vitals for the past 24 hrs:  BP Temp Temp src Pulse Resp SpO2 Height Weight  05/06/17 1201 136/66 - - 92 - 96 % - -  05/06/17 1125 - - - - - 95 % - -  05/06/17 1100 121/72 - - 82 14 99 % - -  05/06/17 1018 - - - - - 98 % - -  05/06/17 1000 (!) 134/107 - - (!) 58 15 98 % - -  05/06/17 0908 - - - - - - 5\' 5"  (1.651 m) 122.5 kg (270 lb)  05/06/17 0907 (!) 138/91 97.8 F (36.6 C) Oral 61 18 98 % - -       Physical Exam 0925: Physical examination:  Nursing notes reviewed; Vital signs and O2 SAT reviewed;  Constitutional: Well developed, Well nourished, Well hydrated, In no acute distress; Head:  Normocephalic, atraumatic; Eyes: EOMI, PERRL, No scleral icterus; ENMT: Mouth and pharynx normal, Mucous membranes moist; Neck: Supple, Full range of motion, No lymphadenopathy; Cardiovascular: Regular rate and rhythm, No gallop; Respiratory: Breath sounds diminished & equal bilaterally, No wheezes.  Speaking full sentences with ease, Normal respiratory effort/excursion; Chest:  Nontender, Movement normal; Abdomen: Soft, Nontender, Nondistended, Normal bowel sounds; Genitourinary: No CVA tenderness; Extremities: Pulses normal, No tenderness, No edema, No calf edema or asymmetry.; Neuro: AA&Ox3, Major CN grossly intact.  Speech clear. No gross focal motor or sensory deficits in extremities.; Skin: Color normal, Warm, Dry.   ED Treatments / Results  Labs (all labs ordered are listed, but only abnormal results are displayed)   EKG  EKG Interpretation  Date/Time:  Sunday May 06 2017 09:31:51 EDT Ventricular Rate:  61 PR Interval:    QRS Duration: 91 QT Interval:  411 QTC Calculation: 414 R Axis:   23 Text Interpretation:  Sinus rhythm When compared with ECG of 04/28/2017 No significant change was  found Confirmed by Francine Graven [phone]) on 05/06/2017 9:34:38 AM       Radiology   Procedures Procedures (including critical care time)  Medications Ordered in ED Medications  methylPREDNISolone sodium succinate (SOLU-MEDROL) 125 mg/2 mL injection 125 mg (not administered)  albuterol (PROVENTIL,VENTOLIN) solution continuous neb (not administered)  ipratropium (ATROVENT) nebulizer solution 1 mg (not administered)     Initial Impression / Assessment and Plan / ED Course  I have reviewed the triage vital signs and the nursing notes.  Pertinent labs & imaging results that were available during my care of the patient were reviewed by me and considered in my medical decision making (see chart for details).  MDM Reviewed: previous chart, vitals and nursing note Reviewed previous: labs, ECG, CT scan and x-ray Interpretation: labs, ECG and x-ray   Results for orders placed or performed during the hospital encounter of 91/63/84  Basic metabolic panel  Result Value Ref Range   Sodium 134 (L) 135 - 145 mmol/L   Potassium 3.7 3.5 - 5.1 mmol/L   Chloride 103 101 - 111 mmol/L   CO2 24 22 - 32 mmol/L   Glucose, Bld 170 (H) 65 - 99 mg/dL   BUN 12 6 - 20 mg/dL    Creatinine, Ser 0.82 0.61 - 1.24 mg/dL   Calcium 8.7 (L) 8.9 - 10.3 mg/dL   GFR calc non Af Amer >60 >60 mL/min   GFR calc Af Amer >60 >60 mL/min   Anion gap 7 5 - 15  CBC  Result Value Ref Range   WBC 6.0 4.0 - 10.5 K/uL   RBC 4.43 4.22 - 5.81 MIL/uL   Hemoglobin 14.9 13.0 - 17.0 g/dL   HCT 42.2 39.0 - 52.0 %   MCV 95.3 78.0 - 100.0 fL   MCH 33.6 26.0 - 34.0 pg   MCHC 35.3 30.0 - 36.0 g/dL   RDW 12.9 11.5 - 15.5 %   Platelets 128 (L) 150 - 400 K/uL  Differential  Result Value Ref Range   Neutrophils Relative % 78 %   Neutro Abs 4.6 1.7 - 7.7 K/uL   Lymphocytes Relative 13 %   Lymphs Abs 0.8 0.7 - 4.0 K/uL   Monocytes Relative 7 %   Monocytes Absolute 0.4 0.1 - 1.0 K/uL   Eosinophils Relative 2 %   Eosinophils Absolute 0.1 0.0 - 0.7 K/uL   Basophils Relative 0 %   Basophils Absolute 0.0 0.0 - 0.1 K/uL  Brain natriuretic peptide  Result Value Ref Range   B Natriuretic Peptide 177.0 (H) 0.0 - 100.0 pg/mL  Troponin I  Result Value Ref Range   Troponin I <0.03 <0.03 ng/mL  Hepatic function panel  Result Value Ref Range   Total Protein 6.4 (L) 6.5 - 8.1 g/dL   Albumin 3.7 3.5 - 5.0 g/dL   AST 25 15 - 41 U/L   ALT 24 17 - 63 U/L   Alkaline Phosphatase 19 (L) 38 - 126 U/L   Total Bilirubin 1.3 (H) 0.3 - 1.2 mg/dL   Bilirubin, Direct 0.2 0.1 - 0.5 mg/dL   Indirect Bilirubin 1.1 (H) 0.3 - 0.9 mg/dL  Lipase, blood  Result Value Ref Range   Lipase 20 11 - 51 U/L  Troponin I  Result Value Ref Range   Troponin I <0.03 <0.03 ng/mL  I-stat troponin, ED  Result Value Ref Range   Troponin i, poc 0.01 0.00 - 0.08 ng/mL   Comment 3  Dg Chest 2 View Result Date: 05/06/2017 CLINICAL DATA:  Chest pain EXAM: CHEST  2 VIEW COMPARISON:  04/28/2017 FINDINGS: Cardiac shadow is mildly enlarged. The lungs are well aerated bilaterally. Calcified granuloma is seen in the left mid lung. No focal infiltrate or sizable effusion is seen. No bony abnormality is noted. IMPRESSION: No  acute abnormality noted. Electronically Signed   By: Inez Catalina M.D.   On: 05/06/2017 09:49    Ct Angio Chest Pe W And/or Wo Contrast Result Date: 04/28/2017 CLINICAL DATA:  56 year old male with cough and shortness of breath for 2 days. Chest pain starting today. Abnormal D-dimer. EXAM: CT ANGIOGRAPHY CHEST WITH CONTRAST TECHNIQUE: Multidetector CT imaging of the chest was performed using the standard protocol during bolus administration of intravenous contrast. Multiplanar CT image reconstructions and MIPs were obtained to evaluate the vascular anatomy. CONTRAST:  100 mL Isovue 370 COMPARISON:  Chest radiographs 1946 hours today and earlier. Chest CTs 12/10/2013 and earlier. FINDINGS: Cardiovascular: Adequate contrast bolus timing in the pulmonary arterial tree. Mild lower lobe respiratory motion. No focal filling defect identified in the pulmonary arteries to suggest acute pulmonary embolism. Mild cardiomegaly. No pericardial effusion. Negative visualized aorta. Mediastinum/Nodes: Negative.  No lymphadenopathy. Lungs/Pleura: Major airways are patent. Lower lung volumes compared to 2015. Lateral left upper lobe calcified granuloma with mild adjacent peribronchial opacity, unchanged since 2014. No acute pulmonary opacity. No pleural effusion. Upper Abdomen: Stable visualized liver. The gallbladder appears to be surgically absent. Negative visible pancreas, spleen, adrenal glands, kidneys, and bowel in the upper abdomen. Musculoskeletal: No acute osseous abnormality identified. Review of the MIP images confirms the above findings. IMPRESSION: Negative for acute pulmonary embolus. No acute findings in the chest. Electronically Signed   By: Genevie Ann M.D.   On: 04/28/2017 20:55    1340:  Extensive workup 04/28/2017 ED visit reassuring. Will not repeat CT-A chest again today (above). Hour long neb and steroid given. Pt states he "feels better" after both.  NAD, lungs CTA bilat, no wheezing, resps easy, speaking  full sentences, Sats 96-99% R/A.  Pt ambulated around the ED with Sats remaining 95% R/A, resps easy, NAD. Pt feels like he is ready to go home now.  BNP mildly elevated, but no overt CHF on today's CXR or previous CT-A.  Pt is not orthostatic on VS. Doubt PE as cause for symptoms with normal CT-A chest on 04/28/17 and low risk [place].  Doubt ACS as cause for symptoms with normal troponin x2 and unchanged EKG from previous after 10 days of constant symptoms. Tx symptomatically at this time, f/u Cards MD and Pulm MD. Dx and testing d/w pt.  Questions answered.  Verb understanding, agreeable to d/c home with outpt f/u.       Final Clinical Impressions(s) / ED Diagnoses   Final diagnoses:  None    New Prescriptions New Prescriptions   No medications on file      Francine Graven, DO 05/10/17 1548

## 2017-05-14 ENCOUNTER — Ambulatory Visit: Payer: Self-pay | Admitting: Family Medicine

## 2017-05-18 ENCOUNTER — Ambulatory Visit: Payer: Self-pay | Admitting: Adult Health

## 2017-05-18 NOTE — Progress Notes (Deleted)
Cardiology Office Note   Date:  05/18/2017   ID:  Blanca, Thornton [birth year], MRN [id number]  PCP:  Timmothy Euler, MD  Cardiologist:  Marge Duncans chief complaint on file.     History of Present Illness: Thomas Ball is a 56 y.o. 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 The patient is not been seen by cardiology since that time. The patient was seen in the emergency room on 05/06/2017 for complaints of chest pain, constant, midsternal, occurring over 10 days. He was associated with dyspnea, nausea and lightheadedness. Extensive workup in the ED did not reveal any acute cardiac issues with normal troponin, no evidence of CHF or pneumonia. No evidence of PE. The patient was to follow-up with cardiology for ongoing management of symptoms. He was also referred back to pulmonology and primary care provider.    Past Medical History:  Diagnosis Date  . GERD (gastroesophageal reflux disease)   . HCV (hepatitis C virus) DR. ZACKS   AUG 2013 VIRAL LOAD UNDETECTABLE  . Pancreatitis, gallstone   . Skin cancer 1992   removed from side of head  . SVT (supraventricular tachycardia) (HCC)     Past Surgical History:  Procedure Laterality Date  . 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 CHOLECYSTECTOMY    . COLONOSCOPY  10/04/2012   SLF:ONE/8 SIMPLE ADENOMA, TICS, SML IH  . ESOPHAGOGASTRODUODENOSCOPY N/A 09/04/2014   Procedure: ESOPHAGOGASTRODUODENOSCOPY (EGD);  Surgeon: Danie Binder, MD;  [facility];  Service: Endoscopy;  Laterality: N/A;  145  . GALLBLADDER SURGERY    . JAW BROKEN    . LIPOMA REMOVALS     SEVERAL  . SUPRAVENTRICULAR TACHYCARDIA ABLATION N/A 06/22/2014   Procedure: SUPRAVENTRICULAR TACHYCARDIA ABLATION;  Surgeon: Evans Lance, MD;  Location: [facility] CATH LAB;  Service: Cardiovascular;  Laterality: N/A;     Current  Outpatient Prescriptions  Medication Sig Dispense Refill  . albuterol (PROVENTIL HFA;VENTOLIN HFA) 108 (90 Base) MCG/ACT inhaler Inhale 2 puffs into the lungs every 4 (four) hours as needed for wheezing or shortness of breath. 1 Inhaler 0  . omeprazole (PRILOSEC) 20 MG capsule 1 po bid 30 minutes before meals for 3 mos then once daily FOREVER (Patient taking differently: Take 20 mg by mouth 2 (two) times daily before a meal. ) 62 capsule 11  . predniSONE (DELTASONE) 20 MG tablet Take 2 tablets (40 mg total) by mouth daily. 10 tablet 0   No current facility-administered medications for this visit.     Allergies:   Patient has no known allergies.    Social History:  The patient  reports that he has been smoking Cigarettes.  He has a 28.00 pack-year smoking history. He has never used smokeless tobacco. He reports that he does not drink alcohol or use drugs.   Family History:  The patient's family history includes COPD in his mother; Diabetes in his father; Early death in his mother; Emphysema in his mother; High Cholesterol in his father; Hyperlipidemia in his father; Stroke in his father.    ROS: All other systems are reviewed and negative. Unless otherwise mentioned in H&P    PHYSICAL EXAM: VS:  There were no vitals taken for this visit. , BMI There is no height or weight on file to calculate BMI. GEN: Well nourished, well developed, in no acute distress  HEENT: normal Neck: no JVD, carotid bruits, or masses Cardiac: ***RRR; no murmurs, rubs, or gallops,no edema  Respiratory:  clear to auscultation bilaterally, normal work of breathing GI: soft, nontender, nondistended, + BS MS: no deformity or atrophy Skin: warm and dry, no rash Neuro:  Strength and sensation are intact Psych: euthymic mood, full affect   EKG:  EKG {ACTION; IS/IS [id number]} ordered today. The ekg ordered today demonstrates ***   Recent Labs: 05/06/2017: ALT 24; B Natriuretic Peptide 177.0; BUN 12; Creatinine,  Ser 0.82; Hemoglobin 14.9; Platelets 128; Potassium 3.7; Sodium 134    Lipid Panel    Component Value Date/Time   CHOL 165 06/12/2015 0805   TRIG 102 06/12/2015 0805   HDL 34 (L) 06/12/2015 0805   CHOLHDL 4.9 06/12/2015 0805   VLDL 20 06/12/2015 0805   LDLCALC 111 06/12/2015 0805      Wt Readings from Last 3 Encounters:  05/06/17 270 lb (122.5 kg)  04/28/17 270 lb (122.5 kg)  04/17/17 279 lb (126.6 kg)      Other studies Reviewed: Additional studies/ records that were reviewed today include: ***. Review of the above records demonstrates: ***   ASSESSMENT AND PLAN:  1.  ***   Current medicines are reviewed at length with the patient today.    Labs/ tests ordered today include: *** Phill Myron. West Pugh, ANP, AACC   05/18/2017 7:15 AM    [facility]  [address] [place], [place] [address] Phone: [phone]; Fax: [phone]

## 2017-06-05 ENCOUNTER — Ambulatory Visit: Payer: Self-pay | Admitting: Adult Health

## 2017-06-05 ENCOUNTER — Encounter: Payer: Self-pay | Admitting: Adult Health

## 2017-06-05 NOTE — Progress Notes (Deleted)
Cardiology Office Note   Date:  06/05/2017   ID:  Thomas Ball, DOB 19-Jun-1961, MRN [id number]  PCP:  Timmothy Euler, MD  Cardiologist:  Harrington Challenger EP: Lovena Le No chief complaint on file.     History of Present Illness: Thomas Ball is a 56 y.o. male who presents for ongoing assessment and management of tachycardia palpitations, which were found to be symptomatic with associated shortness of breath. The patient was admitted to [facility] on 06/23/2014 with ablation of AVNRT.  The patient has been seen in the emergency room on 05/06/2017 for recurrent chest pain described as constant midsternal and had lasted for approximately 10 days. The patient was treated with an hour-long nebulizer treatment and steroids. The patient was ruled out for ACS, not found to have CHF. EKG was unchanged. She was to follow-up with cardiology and pulmonology.    Past Medical History:  Diagnosis Date  . GERD (gastroesophageal reflux disease)   . HCV (hepatitis C virus) DR. ZACKS   AUG 2013 VIRAL LOAD UNDETECTABLE  . Pancreatitis, gallstone   . Skin cancer 1992   removed from side of head  . SVT (supraventricular tachycardia) (HCC)     Past Surgical History:  Procedure Laterality Date  . 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 CHOLECYSTECTOMY    . COLONOSCOPY  10/04/2012   SLF:ONE/8 SIMPLE ADENOMA, TICS, SML IH  . ESOPHAGOGASTRODUODENOSCOPY N/A 09/04/2014   Procedure: ESOPHAGOGASTRODUODENOSCOPY (EGD);  Surgeon: Danie Binder, MD;  [facility];  Service: Endoscopy;  Laterality: N/A;  145  . GALLBLADDER SURGERY    . JAW BROKEN    . LIPOMA REMOVALS     SEVERAL  . SUPRAVENTRICULAR TACHYCARDIA ABLATION N/A 06/22/2014   Procedure: SUPRAVENTRICULAR TACHYCARDIA ABLATION;  Surgeon: Evans Lance, MD;  Location[facility] C[facility] LAB;  Service: Cardiovascular;  Laterality: N/A;     Current Outpatient Prescriptions  Medication Sig Dispense Refill  . albuterol (PROVENTIL HFA;VENTOLIN  HFA) 108 (90 Base) MCG/ACT inhaler Inhale 2 puffs into the lungs every 4 (four) hours as needed for wheezing or shortness of breath. 1 Inhaler 0  . omeprazole (PRILOSEC) 20 MG capsule 1 po bid 30 minutes before meals for 3 mos then once daily FOREVER (Patient taking differently: Take 20 mg by mouth 2 (two) times daily before a meal. ) 62 capsule 11  . predniSONE (DELTASONE) 20 MG tablet Take 2 tablets (40 mg total) by mouth daily. 10 tablet 0   No current facility-administered medications for this visit.     Allergies:   Patient has no known allergies.    Social History:  The patient  reports that he has been smoking Cigarettes.  He has a 28.00 pack-year smoking history. He has never used smokeless tobacco. He reports that he does not drink alcohol or use drugs.   Family History:  The patient's family history includes COPD in his mother; Diabetes in his father; Early death in his mother; Emphysema in his mother; High Cholesterol in his father; Hyperlipidemia in his father; Stroke in his father.    ROS: All other systems are reviewed and negative. Unless otherwise mentioned in H&P    PHYSICAL EXAM: VS:  There were no vitals taken for this visit. , BMI There is no height or weight on file to calculate BMI. GEN: Well nourished, well developed, in no acute distress HEENT: normal Neck: no JVD, carotid bruits, or masses Cardiac: ***RRR; no murmurs, rubs, or gallops,no edema  Respiratory:  clear to auscultation bilaterally, normal work of breathing GI: soft, nontender, nondistended, + BS MS: no deformity or atrophy Skin: warm and dry, no rash Neuro:  Strength and sensation are intact Psych: euthymic mood, full affect   EKG:  EKG {ACTION; IS/IS [id number]} ordered today. The ekg ordered today demonstrates ***   Recent Labs: 05/06/2017: ALT 24; B Natriuretic Peptide 177.0; BUN 12; Creatinine, Ser 0.82; Hemoglobin 14.9; Platelets 128; Potassium 3.7; Sodium 134    Lipid Panel      Component Value Date/Time   CHOL 165 06/12/2015 0805   TRIG 102 06/12/2015 0805   HDL 34 (L) 06/12/2015 0805   CHOLHDL 4.9 06/12/2015 0805   VLDL 20 06/12/2015 0805   LDLCALC 111 06/12/2015 0805      Wt Readings from Last 3 Encounters:  05/06/17 270 lb (122.5 kg)  04/28/17 270 lb (122.5 kg)  04/17/17 279 lb (126.6 kg)      Other studies Reviewed: Additional studies/ records that were reviewed today include: ***. Review of the above records demonstrates: ***   ASSESSMENT AND PLAN:  1.  ***   Current medicines are reviewed at length with the patient today.    Labs/ tests ordered today include: *** Phill Myron. West Pugh, ANP, AACC   06/05/2017 7:29 AM    [facility] [address], [place], [place] [address] Phone: [phone]; Fax: [phone]

## 2017-06-14 ENCOUNTER — Telehealth: Payer: Self-pay | Admitting: Family Medicine

## 2017-06-14 NOTE — Telephone Encounter (Signed)
Pt called and advised he ntbs for prednisone rx and given appt with Dr Wendi Snipes in the morning at 7:55.

## 2017-06-14 NOTE — Telephone Encounter (Signed)
lmtcb

## 2017-06-14 NOTE — Telephone Encounter (Signed)
What is the name of the medication? prednisone  Have you contacted your pharmacy to request a refill? no  Which pharmacy would you like this sent to? The drug store in [place]   Patient notified that their request is being sent to the clinical staff for review and that they should receive a call once it is complete. If they do not receive a call within 24 hours they can check with their pharmacy or our office.

## 2017-06-15 ENCOUNTER — Ambulatory Visit (INDEPENDENT_AMBULATORY_CARE_PROVIDER_SITE_OTHER): Payer: Self-pay | Admitting: Family Medicine

## 2017-06-15 ENCOUNTER — Encounter: Payer: Self-pay | Admitting: Family Medicine

## 2017-06-15 VITALS — BP 112/71 | HR 63 | Temp 97.5°F | Ht 65.0 in | Wt 280.0 lb

## 2017-06-15 DIAGNOSIS — R059 Cough, unspecified: Secondary | ICD-10-CM

## 2017-06-15 DIAGNOSIS — Z720 Tobacco use: Secondary | ICD-10-CM

## 2017-06-15 DIAGNOSIS — R05 Cough: Secondary | ICD-10-CM

## 2017-06-15 MED ORDER — AZITHROMYCIN 250 MG PO TABS: ORAL_TABLET | ORAL | 0 refills | Status: DC

## 2017-06-15 MED ORDER — PREDNISONE 10 MG PO TABS
ORAL_TABLET | ORAL | 0 refills | Status: DC
Start: 2017-06-15 — End: 2017-07-26

## 2017-06-15 MED ORDER — OMEPRAZOLE 20 MG PO CPDR: DELAYED_RELEASE_CAPSULE | ORAL | 3 refills | Status: DC

## 2017-06-15 MED ORDER — BUDESONIDE-FORMOTEROL FUMARATE 160-4.5 MCG/ACT IN AERO: 2.0000 | INHALATION_SPRAY | Freq: Two times a day (BID) | RESPIRATORY_TRACT | 0 refills | Status: DC

## 2017-06-15 NOTE — Progress Notes (Signed)
   HPI  Patient presents today for hospital follow-up.  Patient was seen in the emergency room for chest pain and shortness of breath.  Patient was treated with prednisone and albuterol nebulizers and states that he returned to baseline in the interim. About 2 weeks after running out of medication he began have symptoms again.   he reports approximately one to 2 weeks of worsening shortness of breath and cough. He denies current chest pain or persistent chest pain.  On review of labs he did have slightly elevated BNP, he is self-pay and states that he cannot afford cardiology follow-up at this point.  He requests prednisone, he is trying to quit smoking  PMH: Smoking status noted ROS: Per HPI  Objective: BP 112/71   Pulse 63   Temp (!) 97.5 F (36.4 C) (Oral)   Ht 5\' 5"  (1.651 m)   Wt 280 lb (127 kg)   BMI 46.59 kg/m  Gen: NAD, alert, cooperative with exam HEENT: NCAT CV: RRR, good S1/S2, no murmur Resp: CTABL, no wheezes, non-labored Ext: No edema, warm Neuro: Alert and oriented, No gross deficits  Assessment and plan:  # Cough Likely underlying COPD, considering this a COPD exacerbation Treated with azithromycin plus prednisone Given Symbicort samples, 2 months worth, and instructed how to use the inhaler with a spacer in the clinic. Discussed using the county financials assistance to get medication necessary. He is applying for financial assistance.    Meds ordered this encounter  Medications  . omeprazole (PRILOSEC) 20 MG capsule    Sig: daily    Dispense:  90 capsule    Refill:  3  . predniSONE (DELTASONE) 10 MG tablet    Sig: Take 4 pills a day for 3 days, then 3 pills a day for 3 days, then 2 pills a day for 3 days, then 1 pill a day for 3 days, then stop    Dispense:  30 tablet    Refill:  0  . azithromycin (ZITHROMAX) 250 MG tablet    Sig: Take 2 tablets on day 1 and 1 tablet daily after that    Dispense:  6 tablet    Refill:  0  .  budesonide-formoterol (SYMBICORT) 160-4.5 MCG/ACT inhaler    Sig: Inhale 2 puffs into the lungs 2 (two) times daily.    Dispense:  4 Inhaler    Refill:  0    Laroy Apple, MD [facility] 06/15/2017, 8:23 AM

## 2017-06-21 ENCOUNTER — Ambulatory Visit: Payer: Self-pay | Admitting: Family Medicine

## 2017-07-24 NOTE — Progress Notes (Signed)
Cardiology Office Note   Date:  07/26/2017   ID:  Thomas Ball, DOB [birth year], MRN [id number]  PCP:  Thomas Euler, MD  Cardiologist:  Thomas Peru MD  Chief Complaint  Patient presents with  . Chest Pain  . Shortness of Breath      History of Present Illness: Thomas Ball is a 56 y.o. male who presents for ongoing assessment and management of paroxysmal atrial fibrillation, status post catheter ablation of AV nodal reentrant tachycardia.  The patient was last seen in the office on 08/19/2014 by Dr. Lovena Le.  He has been seen in the emergency room on 05/06/2017 for complaints of chest pain and shortness of breath.  Had extensive workup which included a CT scan, he was ruled out for PE and ACS.  EKG and troponins were negative.  The patient states that he has been having more and more dyspnea on exertion, chest pressure, and fatigue.  He works as a Journalist, newspaper, and has noticed that his energy level is steadily decreasing.  He has had go home from work due to severe fatigue at times.  He denies any diaphoresis associated with the chest pressure.  He states his breathing status has worsened over the last few months.  His wife who is with him, also states that he has stopped breathing on several occasions at night, and also has severe restless legs.  The patient unfortunately continues to smoke and has smoked on and off for the last 30 years.  He has not had any recent labs or testing since 2015.  Past Medical History:  Diagnosis Date  . GERD (gastroesophageal reflux disease)   . HCV (hepatitis C virus) DR. ZACKS   AUG 2013 VIRAL LOAD UNDETECTABLE  . Pancreatitis, gallstone   . Skin cancer 1992   removed from side of head  . SVT (supraventricular tachycardia) (HCC)     Past Surgical History:  Procedure Laterality Date  . 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 CHOLECYSTECTOMY    . COLONOSCOPY  10/04/2012   SLF:ONE/8 SIMPLE ADENOMA, TICS, SML IH  .  ESOPHAGOGASTRODUODENOSCOPY N/A 09/04/2014   Procedure: ESOPHAGOGASTRODUODENOSCOPY (EGD);  Surgeon: Danie Binder, MD;  [facility];  Service: Endoscopy;  Laterality: N/A;  145  . GALLBLADDER SURGERY    . JAW BROKEN    . LIPOMA REMOVALS     SEVERAL  . SUPRAVENTRICULAR TACHYCARDIA ABLATION N/A 06/22/2014   Procedure: SUPRAVENTRICULAR TACHYCARDIA ABLATION;  Surgeon: Evans Lance, MD;  Location: [facility] CATH LAB;  Service: Cardiovascular;  Laterality: N/A;     Current Outpatient Prescriptions  Medication Sig Dispense Refill  . albuterol (PROVENTIL HFA;VENTOLIN HFA) 108 (90 Base) MCG/ACT inhaler Inhale 2 puffs into the lungs every 4 (four) hours as needed for wheezing or shortness of breath. 1 Inhaler 0  . budesonide-formoterol (SYMBICORT) 160-4.5 MCG/ACT inhaler Inhale 2 puffs into the lungs 2 (two) times daily. 4 Inhaler 0  . omeprazole (PRILOSEC) 20 MG capsule daily 90 capsule 3   No current facility-administered medications for this visit.     Allergies:   Patient has no known allergies.    Social History:  The patient  reports that he has been smoking Cigarettes.  He has a 28.00 pack-year smoking history. He has never used smokeless tobacco. He reports that he does not drink alcohol or use drugs.   Family History:  The patient's family history includes COPD in his mother; Diabetes in his father; Early  death in his mother; Emphysema in his mother; High Cholesterol in his father; Hyperlipidemia in his father; Stroke in his father.    ROS: All other systems are reviewed and negative. Unless otherwise mentioned in H&P    PHYSICAL EXAM: VS:  BP 128/74   Pulse 96   Ht 5\' 5"  (1.651 m)   Wt 286 lb (129.7 kg)   SpO2 (!) 75%   BMI 47.59 kg/m  , BMI Body mass index is 47.59 kg/m. GEN: Well nourished, well developed, in no acute distress Obese HEENT: normal  Neck: no JVD, carotid bruits, or masses Cardiac: RRR; no murmurs, rubs, or gallops,no edema  Respiratory:  Clear to  auscultation bilaterally, normal work of breathing GI: soft, nontender, nondistended, + BS significant central obesity MS: no deformity or atrophy  Skin: warm and dry, no rash Neuro:  Strength and sensation are intact Psych: euthymic mood, full affect   EKG:  Marland Kitchen The ekg ordered today demonstrates normal sinus rhythm with frequent ventricular ectopy, PVCs, heart rate 68 bpm.   Recent Labs: 05/06/2017: ALT 24; B Natriuretic Peptide 177.0; BUN 12; Creatinine, Ser 0.82; Hemoglobin 14.9; Platelets 128; Potassium 3.7; Sodium 134    Lipid Panel    Component Value Date/Time   CHOL 165 06/12/2015 0805   TRIG 102 06/12/2015 0805   HDL 34 (L) 06/12/2015 0805   CHOLHDL 4.9 06/12/2015 0805   VLDL 20 06/12/2015 0805   LDLCALC 111 06/12/2015 0805      Wt Readings from Last 3 Encounters:  07/26/17 286 lb (129.7 kg)  06/15/17 280 lb (127 kg)  05/06/17 270 lb (122.5 kg)      Other studies Reviewed: Nuclear medicine stress test 04/21/2014 IMPRESSION: Exercise Capacity: N/A  BP Response: Hypotensive  Clinical Symptoms: none  ECG Impression: N/A  Comparison with Prior Nuclear Study: None  Final Impression:  Low risk nuclear stress test. Small, fixed inferoapical defect, which is likely artifact, but could represent a prior scar. No reversible ischemia. LVEF 53%, normal wall motion.  Echocardiogram: 04/21/2014 Study Conclusions  - Left ventricle: The cavity size was normal. Wall thickness was increased in a pattern of mild LVH. The estimated ejection fraction was 60%. Wall motion was normal; there were no regional wall motion abnormalities. - Right ventricle: The cavity size was normal. Systolic function was normal.  ASSESSMENT AND PLAN:  1.  Chest pain: Described as chest pressure with exertion with worsening fatigue and dyspnea.  Cardiovascular risk factors are of obesity, smoking, and unknown cholesterol status.  No family history of coronary artery disease  but father died of a stroke.  We will plan Lexiscan nuclear medicine study (possibly today because of body habitus), echocardiogram, check fasting lipids and LFTs for risk stratification.  2.  Chronic dyspnea: The patient does smoke and is morbidly obese.  This may be contributing.  I do not hear any wheezing or rhonchi on evaluation.  Investigate further concerning coronary artery disease.  He is also being referred to pulmonary by his primary care physician for ongoing management.  He has been followed by pulmonary in the past.  There is no history of COPD currently.  3.  Questionable obstructive sleep apnea: The patient is being scheduled for a sleep study for evaluation of need for CPAP.  4.  History of AV nodal reentrant tachycardia: History of ablation in 2015 for same.  Has not been followed since.  The patient is not on any AV nodal blocking agents currently.  We will check CBC  and BMET   5.  Obesity: We will check TSH and hemoglobin A1c.  He is advised on reducing his caloric intake and increasing his activity.  6.  Ongoing tobacco abuse: The patient is a 1 pack-a-day smoker and has been smoking on and off for 30 years.  He is advised on smoking cessation.  States he has got for several months at a time but always picked it back up.  I have encouraged him to try stopping again.  Current medicines are reviewed at length with the patient today.    Labs/ tests ordered today include: Echocardiogram, Lexiscan Myoview, CBC, BMET, TSH, fasting lipids LFTs, and hemoglobin A1c.  He will need to follow with a local cardiologist for ongoing management.  He will may be established on next visit   Phill Myron. West Pugh, ANP, AACC   07/26/2017 3:27 PM    [facility] [address], [place], [place] [address] Phone: [phone]; Fax: [phone]

## 2017-07-26 ENCOUNTER — Encounter: Payer: Self-pay | Admitting: *Deleted

## 2017-07-26 ENCOUNTER — Encounter: Payer: Self-pay | Admitting: Adult Health

## 2017-07-26 ENCOUNTER — Ambulatory Visit (INDEPENDENT_AMBULATORY_CARE_PROVIDER_SITE_OTHER): Payer: Self-pay | Admitting: Adult Health

## 2017-07-26 VITALS — BP 128/74 | HR 96 | Ht 65.0 in | Wt 286.0 lb

## 2017-07-26 DIAGNOSIS — Z720 Tobacco use: Secondary | ICD-10-CM

## 2017-07-26 DIAGNOSIS — R002 Palpitations: Secondary | ICD-10-CM

## 2017-07-26 DIAGNOSIS — R0789 Other chest pain: Secondary | ICD-10-CM

## 2017-07-26 DIAGNOSIS — R0602 Shortness of breath: Secondary | ICD-10-CM

## 2017-07-26 DIAGNOSIS — G4730 Sleep apnea, unspecified: Secondary | ICD-10-CM

## 2017-07-26 NOTE — Patient Instructions (Signed)
Medication Instructions:  Your physician recommends that you continue on your current medications as directed. Please refer to the Current Medication list given to you today.   Labwork: Your physician recommends that you return for lab work in: Today   Testing/Procedures: Your physician has requested that you have an echocardiogram. Echocardiography is a painless test that uses sound waves to create images of your heart. It provides your doctor with information about the size and shape of your heart and how well your heart's chambers and valves are working. This procedure takes approximately one hour. There are no restrictions for this procedure.  Your physician has requested that you have a lexiscan myoview. For further information please visit [email or web address]. Please follow instruction sheet, as given.  Your physician has recommended that you have a sleep study. This test records several body functions during sleep, including: brain activity, eye movement, oxygen and carbon dioxide blood levels, heart rate and rhythm, breathing rate and rhythm, the flow of air through your mouth and nose, snoring, body muscle movements, and chest and belly movement.    Follow-Up: Your physician recommends that you schedule a follow-up appointment in: 1 Month   Any Other Special Instructions Will Be Listed Below (If Applicable).  You have been referred to Dr. Luan Pulling    If you need a refill on your cardiac medications before your next appointment, please call your pharmacy.  Thank you for choosing [facility]!

## 2017-08-01 ENCOUNTER — Ambulatory Visit: Payer: Self-pay | Attending: Adult Health | Admitting: Neurology

## 2017-08-01 DIAGNOSIS — R002 Palpitations: Secondary | ICD-10-CM | POA: Insufficient documentation

## 2017-08-01 DIAGNOSIS — G4730 Sleep apnea, unspecified: Secondary | ICD-10-CM

## 2017-08-01 DIAGNOSIS — R0602 Shortness of breath: Secondary | ICD-10-CM | POA: Insufficient documentation

## 2017-08-01 DIAGNOSIS — G4733 Obstructive sleep apnea (adult) (pediatric): Secondary | ICD-10-CM | POA: Insufficient documentation

## 2017-08-06 ENCOUNTER — Encounter (HOSPITAL_COMMUNITY)
Admission: RE | Admit: 2017-08-06 | Discharge: 2017-08-06 | Disposition: A | Discharge status: Home / Self Care | Payer: Self-pay | Source: Ambulatory Visit | Attending: Adult Health | Admitting: Adult Health

## 2017-08-06 ENCOUNTER — Ambulatory Visit (HOSPITAL_COMMUNITY)
Admission: RE | Admit: 2017-08-06 | Discharge: 2017-08-06 | Disposition: A | Discharge status: Home / Self Care | Payer: Self-pay | Source: Ambulatory Visit | Attending: Adult Health | Admitting: Adult Health

## 2017-08-06 ENCOUNTER — Telehealth: Payer: Self-pay | Admitting: *Deleted

## 2017-08-06 DIAGNOSIS — K7460 Unspecified cirrhosis of liver: Secondary | ICD-10-CM | POA: Insufficient documentation

## 2017-08-06 DIAGNOSIS — R002 Palpitations: Secondary | ICD-10-CM | POA: Insufficient documentation

## 2017-08-06 DIAGNOSIS — G4730 Sleep apnea, unspecified: Secondary | ICD-10-CM | POA: Insufficient documentation

## 2017-08-06 DIAGNOSIS — Z6841 Body Mass Index (BMI) 40.0 and over, adult: Secondary | ICD-10-CM | POA: Insufficient documentation

## 2017-08-06 DIAGNOSIS — R0602 Shortness of breath: Secondary | ICD-10-CM | POA: Insufficient documentation

## 2017-08-06 DIAGNOSIS — I471 Supraventricular tachycardia: Secondary | ICD-10-CM | POA: Insufficient documentation

## 2017-08-06 DIAGNOSIS — R001 Bradycardia, unspecified: Secondary | ICD-10-CM | POA: Insufficient documentation

## 2017-08-06 DIAGNOSIS — E669 Obesity, unspecified: Secondary | ICD-10-CM | POA: Insufficient documentation

## 2017-08-06 DIAGNOSIS — Z87891 Personal history of nicotine dependence: Secondary | ICD-10-CM | POA: Insufficient documentation

## 2017-08-06 DIAGNOSIS — K219 Gastro-esophageal reflux disease without esophagitis: Secondary | ICD-10-CM | POA: Insufficient documentation

## 2017-08-06 MED ORDER — REGADENOSON 0.4 MG/5ML IV SOLN
INTRAVENOUS | Status: AC
Start: 2017-08-06 — End: 2017-08-06
  Administered 2017-08-06: 0.4 mg via INTRAVENOUS
  Filled 2017-08-06: qty 5

## 2017-08-06 MED ORDER — SODIUM CHLORIDE 0.9% FLUSH
INTRAVENOUS | Status: AC
  Administered 2017-08-06: 10 mL via INTRAVENOUS
  Filled 2017-08-06: qty 10

## 2017-08-06 MED ORDER — TECHNETIUM TC 99M TETROFOSMIN IV KIT
30.0000 | PACK | Freq: Once | INTRAVENOUS | Status: AC | PRN
  Administered 2017-08-06: 27 via INTRAVENOUS

## 2017-08-06 NOTE — Progress Notes (Signed)
*  PRELIMINARY RESULTS* Echocardiogram 2D Echocardiogram has been performed.  Samuel Germany 08/06/2017, 9:20 AM

## 2017-08-06 NOTE — Telephone Encounter (Signed)
Called patient with test results. No answer. Left message to call back.  

## 2017-08-06 NOTE — Telephone Encounter (Signed)
-----   Message from Lendon Colonel, NP sent at 08/06/2017 11:46 AM EST ----- Echocardiogram results are normal. Continue current regimen please.

## 2017-08-07 ENCOUNTER — Ambulatory Visit (HOSPITAL_COMMUNITY)
Admission: RE | Admit: 2017-08-07 | Discharge: 2017-08-07 | Disposition: A | Discharge status: Home / Self Care | Payer: Self-pay | Source: Ambulatory Visit | Attending: Adult Health | Admitting: Adult Health

## 2017-08-07 LAB — NM MYOCAR MULTI W/SPECT W/WALL MOTION / EF
CHL CUP NUCLEAR SDS: 1
CHL CUP NUCLEAR SRS: 1
CSEPPHR: 100 {beats}/min
LHR: 0.44
LV dias vol: 127 mL (ref 62–150)
LV sys vol: 59 mL
NUC STRESS TID: 0.78
Rest HR: 63 {beats}/min
SSS: 2

## 2017-08-07 MED ORDER — TECHNETIUM TC 99M TETROFOSMIN IV KIT
25.0000 | PACK | Freq: Once | INTRAVENOUS | Status: AC | PRN
  Administered 2017-08-07: 28 via INTRAVENOUS

## 2017-08-08 ENCOUNTER — Encounter: Payer: Self-pay | Admitting: Gastroenterology

## 2017-08-11 NOTE — Procedures (Signed)
   [place] A. Merlene Laughter, MD     [email or web address]             NOCTURNAL POLYSOMNOGRAPHY   LOCATION: [facility]  Patient Name: Thomas Ball, Thomas Ball Date: 08/01/2017 Gender: Male D.O.B: 16-Dec-1960 Age (years): 9 Referring Provider: Jory Sims NP Height (inches): 65 Interpreting Physician: Phillips Odor MD, ABSM Weight (lbs): 286 RPSGT: Peak, Robert  BMI: 48 MRN: [id number] Neck Size: 19.00   CLINICAL INFORMATION  Sleep Study Type: NPSG    Indication for sleep study: N/A    Epworth Sleepiness Score: 6    SLEEP STUDY TECHNIQUE  As per the AASM Manual for the Scoring of Sleep and Associated Events v2.3 (April 2016) with a hypopnea requiring 4% desaturations.  The channels recorded and monitored were frontal, central and occipital EEG, electrooculogram (EOG), submentalis EMG (chin), nasal and oral airflow, thoracic and abdominal wall motion, anterior tibialis EMG, snore microphone, electrocardiogram, and pulse oximetry.  MEDICATIONS  Medications self-administered by patient taken the night of the study : N/A   Current Outpatient Medications:  .  albuterol (PROVENTIL HFA;VENTOLIN HFA) 108 (90 Base) MCG/ACT inhaler, Inhale 2 puffs into the lungs every 4 (four) hours as needed for wheezing or shortness of breath., Disp: 1 Inhaler, Rfl: 0 .  budesonide-formoterol (SYMBICORT) 160-4.5 MCG/ACT inhaler, Inhale 2 puffs into the lungs 2 (two) times daily., Disp: 4 Inhaler, Rfl: 0 .  omeprazole (PRILOSEC) 20 MG capsule, daily, Disp: 90 capsule, Rfl: 3    SLEEP ARCHITECTURE  The study was initiated at 9:20:10 PM and ended at 3:49:02 AM.  Sleep onset time was 6.4 minutes and the sleep efficiency was 84.5%. The total sleep time was 328.5 minutes.  Stage REM latency was 175.0 minutes.  The patient spent 19.63% of the night in stage N1 sleep, 73.06% in stage N2 sleep, 0.00% in stage N3 and 7.31% in REM.  Alpha intrusion was absent.  Supine sleep was 0.00%.    RESPIRATORY PARAMETERS  The overall apnea/hypopnea index (AHI) was 40.4 per hour. There were 71 total apneas, including 34 obstructive, 21 central and 16 mixed apneas. There were 150 hypopneas and 12 RERAs.  The AHI during Stage REM sleep was 67.5 per hour. AHI while supine was N/A per hour.  The mean oxygen saturation was 90.38%. The minimum SpO2 during sleep was 74.00%.  loud snoring was noted during this study. CARDIAC DATA  The 2 lead EKG demonstrated sinus rhythm. The mean heart rate was 56.98 beats per minute. Other EKG findings include: PVCs. LEG MOVEMENT DATA  The total PLMS were 0 with a resulting PLMS index of 0.00. Associated arousal with leg movement index was 0.0.   IMPRESSIONS  1. Severe obstructive sleep apnea worse during REM sleep is documented during this study.  Formal CPAP titration study is recommended. 2. Absent slow wave sleep is observed.  Delano Metz, MD Diplomate, American Board of Sleep Medicine. ELECTRONICALLY SIGNED ON:  08/11/2017, 5:07 PM [place] PH: [phone]   FX: [phone] [place]

## 2017-08-14 NOTE — Progress Notes (Signed)
Please refer this patient to pulmonary due to severe sleep apnea., Dr. Luan Pulling is fine unless he wants to go to Pulmonary in [place].

## 2017-08-15 ENCOUNTER — Telehealth: Payer: Self-pay | Admitting: Internal Medicine

## 2017-08-15 NOTE — Telephone Encounter (Signed)
Patient has no c/o SOB, states his O2 sats are in the 90's now.He will continue to monitor and go to ED if needed.Bernerd Pho PA-C consulted and agreed

## 2017-08-15 NOTE — Telephone Encounter (Signed)
lvm stating that his oxygen level is in the 70 / 80's today--would like to know if he need to go to the ER--please give him a call @ [phone]

## 2017-08-17 ENCOUNTER — Ambulatory Visit (INDEPENDENT_AMBULATORY_CARE_PROVIDER_SITE_OTHER): Payer: Self-pay | Admitting: Family Medicine

## 2017-08-17 ENCOUNTER — Encounter: Payer: Self-pay | Admitting: Family Medicine

## 2017-08-17 VITALS — BP 127/71 | HR 63 | Temp 97.4°F | Ht 65.0 in | Wt 286.0 lb

## 2017-08-17 DIAGNOSIS — J209 Acute bronchitis, unspecified: Secondary | ICD-10-CM

## 2017-08-17 DIAGNOSIS — J440 Chronic obstructive pulmonary disease with acute lower respiratory infection: Secondary | ICD-10-CM

## 2017-08-17 MED ORDER — DOXYCYCLINE HYCLATE 100 MG PO TABS: 100.0000 mg | ORAL_TABLET | Freq: Two times a day (BID) | ORAL | 0 refills | Status: DC

## 2017-08-17 MED ORDER — HYDROCODONE-HOMATROPINE 5-1.5 MG/5ML PO SYRP: 5.0000 mL | ORAL_SOLUTION | Freq: Every evening | ORAL | 0 refills | Status: DC | PRN

## 2017-08-17 NOTE — Progress Notes (Signed)
Patient ID: Thomas Ball, male   DOB: Jan 15, 1961, 56 y.o.   MRN: [id number] Patient here today for cough and congestion that started about 2 weeks ago.  Cough is productive of colored sputum.  He has a history of pneumonia.  Also has a history of obstructive sleep apnea and has had a sleep study but has of yet no CPAP.  He denies fever or chills.  Apparently has monitor for oxygen saturation and has noted some readings consistent with hypoxemia.    Patient Active Problem List   Diagnosis Date Noted  . BPH (benign prostatic hyperplasia) 10/07/2015  . Cough 09/17/2015  . EIC (epidermal inclusion cyst) 07/23/2015  . Tobacco abuse 07/02/2015  . SVT (supraventricular tachycardia) ([place])   . Intermittent palpitations 04/21/2014  . Bradycardia 04/21/2014  . Hypotension 04/21/2014  . Obesity 04/21/2014  . Chest pain 04/20/2014  . Cirrhosis of liver ([place]) 11/05/2013  . GERD (gastroesophageal reflux disease) 07/18/2012  . Screening for colorectal cancer 07/18/2012  . Pulmonary nodule 02/14/2012   Outpatient Encounter Medications as of 08/17/2017  Medication Sig  . albuterol (PROVENTIL HFA;VENTOLIN HFA) 108 (90 Base) MCG/ACT inhaler Inhale 2 puffs into the lungs every 4 (four) hours as needed for wheezing or shortness of breath.  . budesonide-formoterol (SYMBICORT) 160-4.5 MCG/ACT inhaler Inhale 2 puffs into the lungs 2 (two) times daily.  Marland Kitchen omeprazole (PRILOSEC) 20 MG capsule daily   No facility-administered encounter medications on file as of 08/17/2017.      BP 127/71 (BP Location: Left Arm)   Pulse 63   Temp (!) 97.4 F (36.3 C) (Oral)   Ht 5\' 5"  (1.651 m)   Wt 286 lb (129.7 kg)   SpO2 97%   BMI 47.59 kg/m  Patient in no acute distress HEENT: Sinuses are nontender.  Throat is benign. Chest: Lungs have scattered wheezes no rales O2 saturation 97%  Assessment: Probable bronchitis. Treat with Z-Pak, Hycodan, 1-2 teaspoons at bedtime.  Samples of Symbicort

## 2017-08-20 ENCOUNTER — Other Ambulatory Visit: Payer: Self-pay

## 2017-08-20 ENCOUNTER — Ambulatory Visit: Payer: Self-pay | Admitting: Internal Medicine

## 2017-08-20 ENCOUNTER — Telehealth: Payer: Self-pay | Admitting: Internal Medicine

## 2017-08-20 DIAGNOSIS — R0602 Shortness of breath: Secondary | ICD-10-CM

## 2017-08-20 DIAGNOSIS — G4730 Sleep apnea, unspecified: Secondary | ICD-10-CM

## 2017-08-20 NOTE — Telephone Encounter (Signed)
Pt would like to go to [facility] for pulmonology not Dr. Luan Pulling

## 2017-08-20 NOTE — Telephone Encounter (Signed)
Referral place to [facility] on Lawrence Santiago, LM for patient with info

## 2017-08-22 ENCOUNTER — Telehealth: Payer: Self-pay | Admitting: Adult Health

## 2017-08-22 NOTE — Telephone Encounter (Signed)
Please give pt a call concerning CPAP machine

## 2017-08-22 NOTE — Telephone Encounter (Signed)
Called patient. No answer. Left message to call back.  

## 2017-08-22 NOTE — Telephone Encounter (Signed)
Spoke with pt states he will call PCP concerning CPAP

## 2017-08-23 ENCOUNTER — Telehealth: Payer: Self-pay | Admitting: Family Medicine

## 2017-08-23 NOTE — Telephone Encounter (Signed)
Aware, will need to follow through with pulmonology.

## 2017-09-06 ENCOUNTER — Other Ambulatory Visit (HOSPITAL_COMMUNITY)
Admission: RE | Admit: 2017-09-06 | Discharge: 2017-09-06 | Disposition: A | Discharge status: Home / Self Care | Payer: Self-pay | Source: Ambulatory Visit | Attending: Adult Health | Admitting: Adult Health

## 2017-09-06 DIAGNOSIS — G4730 Sleep apnea, unspecified: Secondary | ICD-10-CM | POA: Insufficient documentation

## 2017-09-06 DIAGNOSIS — R0602 Shortness of breath: Secondary | ICD-10-CM | POA: Insufficient documentation

## 2017-09-06 DIAGNOSIS — R002 Palpitations: Secondary | ICD-10-CM | POA: Insufficient documentation

## 2017-09-06 LAB — CBC WITH DIFFERENTIAL/PLATELET
BASOS ABS: 0 10*3/uL (ref 0.0–0.1)
Basophils Relative: 0 %
Eosinophils Absolute: 0.1 10*3/uL (ref 0.0–0.7)
Eosinophils Relative: 2 %
HCT: 45.8 % (ref 39.0–52.0)
Hemoglobin: 15.4 g/dL (ref 13.0–17.0)
LYMPHS ABS: 1.4 10*3/uL (ref 0.7–4.0)
LYMPHS PCT: 21 %
MCH: 32.5 pg (ref 26.0–34.0)
MCHC: 33.6 g/dL (ref 30.0–36.0)
MCV: 96.6 fL (ref 78.0–100.0)
MONO ABS: 0.5 10*3/uL (ref 0.1–1.0)
Monocytes Relative: 8 %
Neutro Abs: 4.8 10*3/uL (ref 1.7–7.7)
Neutrophils Relative %: 69 %
Platelets: 158 10*3/uL (ref 150–400)
RBC: 4.74 MIL/uL (ref 4.22–5.81)
RDW: 12.6 % (ref 11.5–15.5)
WBC: 6.9 10*3/uL (ref 4.0–10.5)

## 2017-09-06 LAB — LIPID PANEL
CHOLESTEROL: 178 mg/dL (ref 0–200)
HDL: 33 mg/dL — ABNORMAL LOW (ref >40)
LDL Cholesterol: 121 mg/dL — ABNORMAL HIGH (ref 0–99)
TRIGLYCERIDES: 120 mg/dL (ref <150)
Total CHOL/HDL Ratio: 5.4 RATIO
VLDL: 24 mg/dL (ref 0–40)

## 2017-09-06 LAB — TSH: TSH: 1.491 u[IU]/mL (ref 0.350–4.500)

## 2017-09-06 LAB — MAGNESIUM: Magnesium: 2 mg/dL (ref 1.7–2.4)

## 2017-09-07 ENCOUNTER — Telehealth: Payer: Self-pay | Admitting: *Deleted

## 2017-09-07 DIAGNOSIS — E7849 Other hyperlipidemia: Secondary | ICD-10-CM

## 2017-09-07 MED ORDER — ATORVASTATIN CALCIUM 40 MG PO TABS: 40.0000 mg | ORAL_TABLET | Freq: Every day | ORAL | 3 refills | Status: DC

## 2017-09-07 NOTE — Telephone Encounter (Signed)
-----   Message from Lendon Colonel, NP sent at 09/07/2017  6:56 AM EST ----- Please begin Lipitor 40 mg daily. Repeat the lipid study in 6 weeks.

## 2017-09-08 LAB — HEMOGLOBIN A1C
Hgb A1c MFr Bld: 5.9 % — ABNORMAL HIGH (ref 4.8–5.6)
Mean Plasma Glucose: 123 mg/dL

## 2017-09-19 ENCOUNTER — Ambulatory Visit: Payer: Self-pay | Admitting: Internal Medicine

## 2017-09-19 ENCOUNTER — Encounter: Payer: Self-pay | Admitting: Internal Medicine

## 2017-09-28 ENCOUNTER — Institutional Professional Consult (permissible substitution): Payer: Self-pay | Admitting: Pulmonary Disease

## 2017-10-29 ENCOUNTER — Institutional Professional Consult (permissible substitution): Payer: Self-pay | Admitting: Pulmonary Disease

## 2017-11-21 ENCOUNTER — Institutional Professional Consult (permissible substitution): Payer: Self-pay | Admitting: Pulmonary Disease

## 2017-11-29 ENCOUNTER — Ambulatory Visit: Payer: Self-pay | Admitting: Family Medicine

## 2018-01-08 ENCOUNTER — Encounter: Payer: Self-pay | Admitting: Pulmonary Disease

## 2018-01-08 ENCOUNTER — Telehealth: Payer: Self-pay | Admitting: Pulmonary Disease

## 2018-01-08 ENCOUNTER — Ambulatory Visit (INDEPENDENT_AMBULATORY_CARE_PROVIDER_SITE_OTHER): Payer: Self-pay | Admitting: Pulmonary Disease

## 2018-01-08 VITALS — BP 122/92 | HR 64 | Ht 70.0 in | Wt 290.6 lb

## 2018-01-08 DIAGNOSIS — R0609 Other forms of dyspnea: Secondary | ICD-10-CM

## 2018-01-08 DIAGNOSIS — G4733 Obstructive sleep apnea (adult) (pediatric): Secondary | ICD-10-CM

## 2018-01-08 NOTE — Patient Instructions (Signed)
Will arrange for pulmonary function test  Will arrange for CPAP set up  Follow up in 2 months after CPAP set up

## 2018-01-08 NOTE — Progress Notes (Signed)
   Subjective:    Patient ID: Thomas Ball, male    DOB: 06-26-1961, 57 y.o.   MRN: [id number]  HPI    Review of Systems  Constitutional: Positive for unexpected weight change. Negative for fever.  HENT: Positive for congestion. Negative for dental problem, ear pain, nosebleeds, postnasal drip, rhinorrhea, sinus pressure, sneezing, sore throat and trouble swallowing.   Eyes: Negative for redness and itching.  Respiratory: Positive for cough, chest tightness, shortness of breath and wheezing.   Cardiovascular: Negative for palpitations and leg swelling.  Gastrointestinal: Negative for nausea and vomiting.  Genitourinary: Positive for dysuria.  Musculoskeletal: Negative for joint swelling.  Skin: Negative for rash.  Allergic/Immunologic: Positive for environmental allergies. Negative for food allergies and immunocompromised state.  Neurological: Negative for headaches.  Hematological: Does not bruise/bleed easily.  Psychiatric/Behavioral: Negative for dysphoric mood. The patient is not nervous/anxious.        Objective:   Physical Exam        Assessment & Plan:

## 2018-01-08 NOTE — Progress Notes (Signed)
[facility]  Chief Complaint  Patient presents with  . sleep consult    Pt referred by [facility]- cardiologist. Pt cannot stay asleep, wakes up every half hour, cannot sleep more than one hr at a time each night. Pt wakes up tired, stays tired during the day, and takes about 2 naps each day for about 1-2hrs.    Vital signs: BP (!) 122/92 (BP Location: Left Arm, Cuff Size: Normal)   Pulse 64   Ht 5\' 10"  (1.778 m)   Wt 290 lb 9.6 oz (131.8 kg)   SpO2 96%   BMI 41.70 kg/m   History of Present Illness: Thomas Ball is a 57 y.o. male for evaluation of sleep problems.  He has noticed trouble with his sleep for years.  He snores and will stop breathing while asleep.  He has trouble staying focused during the day.  He can't sleep on his back.    He goes to sleep at 9 pm.  He falls asleep quickly.  He wakes up some times to use the bathroom.  He gets out of bed at 5 am.  He feels tired in the morning.  He denies morning headache.  He does not use anything to help him fall sleep or stay awake.  He denies sleep walking, sleep talking, bruxism, or nightmares.  There is no history of restless legs.  He denies sleep hallucinations, sleep paralysis, or cataplexy.  The Epworth score is 19 out of 24.  He continues to smoke cigarettes.  He gets short of breath with activity and has a cough.  He will get wheezing and chest tightness.  He was previously followed by Dr. Melvyn Novas for possible COPD and lung nodules.  He has been using symbicort and albuterol.  These help some.   Physical Exam:  General - pleasant Eyes - pupils reactive ENT - no sinus tenderness, no oral exudate, no LAN, poor dentition Cardiac - regular, no murmur Chest - no wheeze, rales Abd - soft, non tender Ext - no edema Skin - no rashes Neuro - normal strength Psych - normal mood  Discussion: He has snoring, sleep disruption, witnessed apnea, and daytime sleepiness.  His recent  sleep study showed severe obstructive sleep apnea.  We discussed how sleep apnea can affect various health problems, including risks for hypertension, cardiovascular disease, and diabetes.  We also discussed how sleep disruption can increase risks for accidents, such as while driving.  Weight loss as a means of improving sleep apnea was also reviewed.  Additional treatment options discussed were CPAP therapy, oral appliance, and surgical intervention.  He is a smoker.  He has dyspnea on exertion with cough and wheezing.  Assessment/Plan:  Obstructive sleep apnea. - will arrange for CPAP set up - he does not have insurance >> will try to arrange for patient assistance to get his set up  Tobacco abuse with concern for COPD. - discussed options to assist with smoking cessation - he will try quitting on his own - will arrange for pulmonary function testing  History of lung nodules. - calcified granulomas on previous imaging - defer further imaging at this time  Obesity. - reviewed options to help with diet modification   Patient Instructions  Will arrange for pulmonary function test  Will arrange for CPAP set up  Follow up in 2 months after CPAP set up     Chesley Mires, MD [place] 01/08/2018, 11:58 AM  Flow Sheet  Pulmonary tests: CT angio chest 04/28/17 >> calcified granuloma LUL  Sleep tests: PSG 08/01/17 >> AHI 40.4, SpO2 low 74%  Cardiac tests: Echo 08/06/17 >> mild LVH, EF 60 to 65%  Review of Systems: Constitutional: Positive for unexpected weight change. Negative for fever.  HENT: Positive for congestion. Negative for dental problem, ear pain, nosebleeds, postnasal drip, rhinorrhea, sinus pressure, sneezing, sore throat and trouble swallowing.   Eyes: Negative for redness and itching.  Respiratory: Positive for cough, chest tightness, shortness of breath and wheezing.   Cardiovascular: Negative for palpitations and leg swelling.    Gastrointestinal: Negative for nausea and vomiting.  Genitourinary: Positive for dysuria.  Musculoskeletal: Negative for joint swelling.  Skin: Negative for rash.  Allergic/Immunologic: Positive for environmental allergies. Negative for food allergies and immunocompromised state.  Neurological: Negative for headaches.  Hematological: Does not bruise/bleed easily.  Psychiatric/Behavioral: Negative for dysphoric mood. The patient is not nervous/anxious.    Past Medical History: He  has a past medical history of GERD (gastroesophageal reflux disease), HCV (hepatitis C virus) ([facility]), Pancreatitis, gallstone, Skin cancer (1992), and SVT (supraventricular tachycardia) ([place]).  Past Surgical History: He  has a past surgical history that includes JAW BROKEN; Cholecystectomy; LIPOMA REMOVALS; Colonoscopy (10/04/2012); Ablation (06-22-14); supraventricular tachycardia ablation (N/A, 06/22/2014); Esophagogastroduodenoscopy (N/A, 09/04/2014); and Gallbladder surgery.  Family History: His family history includes COPD in his mother; Diabetes in his father; Early death in his mother; Emphysema in his mother; High Cholesterol in his father; Hyperlipidemia in his father; Stroke in his father.  Social History: He  reports that he has been smoking cigarettes.  He has a 52.50 pack-year smoking history. He has never used smokeless tobacco. He reports that he does not drink alcohol or use drugs.  Medications: Allergies as of 01/08/2018   No Known Allergies     Medication List        Accurate as of 01/08/18 11:58 AM. Always use your most recent med list.          albuterol 108 (90 Base) MCG/ACT inhaler Commonly known as:  PROVENTIL HFA;VENTOLIN HFA Inhale 2 puffs into the lungs every 4 (four) hours as needed for wheezing or shortness of breath.   atorvastatin 40 MG tablet Commonly known as:  LIPITOR Take 1 tablet (40 mg total) by mouth daily at 6 PM.   budesonide-formoterol 160-4.5 MCG/ACT  inhaler Commonly known as:  SYMBICORT Inhale 2 puffs into the lungs 2 (two) times daily.   omeprazole 20 MG capsule Commonly known as:  PRILOSEC daily

## 2018-01-08 NOTE — Telephone Encounter (Signed)
Called and spoke with patient, he states that he was seen today and wanted to make VS aware that he does headaches. He was not sure what VS meant today when he was in the visit.   Sending this as FYI. Nothing further needed.

## 2018-01-08 NOTE — Telephone Encounter (Signed)
Called and spoke with pt who stated they were waiting for a CPAP Rx to be sent to [employer] for assistance.  Pt paid the $100 donation but the [employer] has not received a fax yet for the prescription.  Kelli, please advise if you know anything with regards to this and what needs to be done next? I saw that pt had a sleep consult today with VS and Dr. Halford Chessman stated we were going to arrange cpap for pt.

## 2018-01-09 ENCOUNTER — Encounter: Payer: Self-pay | Admitting: Pulmonary Disease

## 2018-01-10 NOTE — Telephone Encounter (Signed)
VS is out of the office from 01-08-18 and return back on Monday 01-14-18 CPAP assistance forms are in the green folder in VS cubby to sign and send off Once signed and completed by VS, I will fax them for assistance on CPAP  Will follow up next week

## 2018-01-13 NOTE — Telephone Encounter (Signed)
Noted.  Will discuss further at next [place].

## 2018-01-16 NOTE — Telephone Encounter (Signed)
Per Vida Roller- forms will be given to VS on 01/17/18

## 2018-01-18 NOTE — Telephone Encounter (Signed)
VS has reviewed, signed and completed the cpap assistance forms These have been faxed over fax [phone] It approx takes few weeks for the results, pt would be contacted from [employer] Nothing further needed at this time

## 2018-01-18 NOTE — Telephone Encounter (Signed)
Kelli please advise if these forms have been given to Dr. Halford Chessman. Thanks.

## 2018-01-21 ENCOUNTER — Telehealth: Payer: Self-pay | Admitting: Pulmonary Disease

## 2018-01-21 NOTE — Telephone Encounter (Signed)
Called and spoke with patients wife. She states that the company called her and all machines are on back order. They can send the patient one but it will be with no settings.    VS are you ok with this, is there another option you would like to consider.

## 2018-01-21 NOTE — Telephone Encounter (Signed)
Called and spoke with patients wife. She states that once she get the machine from the family member she will call us to see how it needs to be used. She is confused about the whole situations and stated that she will call once she has it.

## 2018-01-21 NOTE — Telephone Encounter (Signed)
Okay to have him try his relatives CPAP machine until he can get one set up through DME.

## 2018-02-11 ENCOUNTER — Telehealth: Payer: Self-pay | Admitting: Pulmonary Disease

## 2018-02-11 DIAGNOSIS — G4733 Obstructive sleep apnea (adult) (pediatric): Secondary | ICD-10-CM

## 2018-02-11 NOTE — Telephone Encounter (Signed)
Called and spoke with pt's spouse Arbie Cookey who is requesting for pt's sleep study results from 08/01/17 and cpap prescription information to be sent to [facility].   Per Arbie Cookey, information went to a DME that was in [place] which she states is too far away from them and want to use [facility] instead.  Will place another order to have pt's DME be changed to [facility].

## 2018-03-04 ENCOUNTER — Encounter: Payer: Self-pay | Admitting: Family Medicine

## 2018-03-04 ENCOUNTER — Ambulatory Visit (INDEPENDENT_AMBULATORY_CARE_PROVIDER_SITE_OTHER): Payer: Self-pay | Admitting: Family Medicine

## 2018-03-04 VITALS — BP 142/87 | HR 71 | Temp 97.1°F | Ht 70.0 in | Wt 291.4 lb

## 2018-03-04 DIAGNOSIS — G4733 Obstructive sleep apnea (adult) (pediatric): Secondary | ICD-10-CM

## 2018-03-04 DIAGNOSIS — N401 Enlarged prostate with lower urinary tract symptoms: Secondary | ICD-10-CM

## 2018-03-04 DIAGNOSIS — R351 Nocturia: Secondary | ICD-10-CM

## 2018-03-04 DIAGNOSIS — I10 Essential (primary) hypertension: Secondary | ICD-10-CM

## 2018-03-04 MED ORDER — DOXAZOSIN MESYLATE 1 MG PO TABS: 1.0000 mg | ORAL_TABLET | Freq: Every day | ORAL | 2 refills | Status: DC

## 2018-03-04 MED ORDER — ATORVASTATIN CALCIUM 40 MG PO TABS: 40.0000 mg | ORAL_TABLET | Freq: Every day | ORAL | 4 refills | Status: DC

## 2018-03-04 NOTE — Progress Notes (Signed)
Subjective: CC: "BP issues" PCP: Timmothy Euler, MD FMB:[id number] Thomas Ball is a 57 y.o. male presenting to clinic today for:  1.  Hypertension Patient reports that he has had intermittent high blood pressures.  He states that on Saturday he measured blood pressures of 148/100.  Denies chest pains, dizziness, diaphoresis, nausea or vomiting.  Denies lower extremity edema.  He reports intermittent dyspnea with significant exertion, citing that he works quite a bit outside.  He notes that he was diagnosed with obstructive sleep apnea this past year and is currently awaiting a CPAP machine.  He reports that he is uninsured and therefore had to wait in order to afford this.  He plans to have this in the next 2 weeks.  His wife reports that he does take in a lot of salt.  He is also an active every day smoker.  He goes on to report BPH symptoms, citing that sometimes he has difficulty starting a stream and emptying his bladder completely.  No unplanned weight loss, night sweats.  ROS: Per HPI  No Known Allergies Past Medical History:  Diagnosis Date  . GERD (gastroesophageal reflux disease)   . HCV (hepatitis C virus) DR. ZACKS   AUG 2013 VIRAL LOAD UNDETECTABLE  . Pancreatitis, gallstone   . Skin cancer 1992   removed from side of head  . SVT (supraventricular tachycardia) ([facility])     Current Outpatient Medications:  .  albuterol (PROVENTIL HFA;VENTOLIN HFA) 108 (90 Base) MCG/ACT inhaler, Inhale 2 puffs into the lungs every 4 (four) hours as needed for wheezing or shortness of breath. (Patient not taking: Reported on 03/04/2018), Disp: 1 Inhaler, Rfl: 0 .  atorvastatin (LIPITOR) 40 MG tablet, Take 1 tablet (40 mg total) by mouth daily at 6 PM., Disp: 90 tablet, Rfl: 3 .  budesonide-formoterol (SYMBICORT) 160-4.5 MCG/ACT inhaler, Inhale 2 puffs into the lungs 2 (two) times daily. (Patient not taking: Reported on 01/08/2018), Disp: 4 Inhaler, Rfl: 0 .  omeprazole (PRILOSEC) 20 MG capsule, daily  (Patient not taking: Reported on 03/04/2018), Disp: 90 capsule, Rfl: 3 Social History   Socioeconomic History  . Marital status: Married    Spouse name: Not on file  . Number of children: Not on file  . Years of education: Not on file  . Highest education level: Not on file  Occupational History  . Occupation: concrete work    Fish farm manager: SELF-EMPLOYED  Social Needs  . Financial resource strain: Not on file  . Food insecurity:    Worry: Not on file    Inability: Not on file  . Transportation needs:    Medical: Not on file    Non-medical: Not on file  Tobacco Use  . Smoking status: Current Every Day Smoker    Packs/day: 1.50    Years: 35.00    Pack years: 52.50    Types: Cigarettes  . Smokeless tobacco: Never Used  . Tobacco comment: had quit for 9 mos, picked up again 2 mos ago  Substance and Sexual Activity  . Alcohol use: No  . Drug use: No    Comment: in the 1980s, smoke crack. None since Dec 2011.   Marland Kitchen Sexual activity: Not on file  Lifestyle  . Physical activity:    Days per week: Not on file    Minutes per session: Not on file  . Stress: Not on file  Relationships  . Social connections:    Talks on phone: Not on file    Gets together:  Not on file    Attends religious service: Not on file    Active member of club or organization: Not on file    Attends meetings of clubs or organizations: Not on file    Relationship status: Not on file  . Intimate partner violence:    Fear of current or ex partner: Not on file    Emotionally abused: Not on file    Physically abused: Not on file    Forced sexual activity: Not on file  Other Topics Concern  . Not on file  Social History Narrative  . Not on file   Family History  Problem Relation Age of Onset  . Emphysema Mother   . COPD Mother   . Early death Mother   . Hyperlipidemia Father   . Stroke Father   . Diabetes Father   . High Cholesterol Father   . Colon cancer Neg Hx     Objective: Office vital signs  reviewed. BP (!) 142/87   Pulse 71   Temp (!) 97.1 F (36.2 C) (Oral)   Ht 5\' 10"  (1.778 m)   Wt 291 lb 6.4 oz (132.2 kg)   BMI 41.81 kg/m   Physical Examination:  General: Awake, alert, obese, No acute distress HEENT: Normal    Neck: No masses palpated. No lymphadenopathy; no JVD. No carotid bruits. Cardio: regular rate and rhythm, S1S2 heard, no murmurs appreciated Pulm: Breath sounds difficult to auscultate but seemingly clear to auscultation bilaterally, no wheezes, rhonchi or rales; normal work of breathing on room air Extremities: warm, well perfused, No edema, cyanosis or clubbing; +2 pulses bilaterally MSK: normal gait and normal station  Assessment/ Plan: 56 y.o. male   1. Essential hypertension Blood pressure elevated during today's visit.  No red flag signs or symptoms.  We discussed blood pressure medication options today.  Given his concomitant BPH symptoms, will proceed with Cardura 1 mg daily.  We will plan to increase pending response.  We discussed reducing salt intake, increasing physical activity.  We also discussed smoking cessation but patient seems to be contemplative at this time.  Monitor blood pressures.  Follow-up with nurse in 4 weeks for blood pressure recheck.  2. Benign prostatic hyperplasia with nocturia  3. OSA (obstructive sleep apnea) I suspect that blood pressures will improve once he starts using his CPAP machine.  He will follow-up in 4 weeks.  Meds ordered this encounter  Medications  . doxazosin (CARDURA) 1 MG tablet    Sig: Take 1 tablet (1 mg total) by mouth daily.    Dispense:  30 tablet    Refill:  2  . atorvastatin (LIPITOR) 40 MG tablet    Sig: Take 1 tablet (40 mg total) by mouth daily at 6 PM.    Dispense:  90 tablet    Refill:  [place], DO [place] [phone]

## 2018-03-04 NOTE — Patient Instructions (Signed)
I have prescribed you Cardura 1 mg to take daily.  Monitor your blood pressures.  Your goal blood pressure is less 140/90.  We discussed salt reduction.  Your blood pressure actually may improve after starting CPAP.  Plan to see [place] back here for a nurse's blood pressure check in 4 weeks.   Hypertension Hypertension, commonly called high blood pressure, is when the force of blood pumping through the arteries is too strong. The arteries are the blood vessels that carry blood from the heart throughout the body. Hypertension forces the heart to work harder to pump blood and may cause arteries to become narrow or stiff. Having untreated or uncontrolled hypertension can cause heart attacks, strokes, kidney disease, and other problems. A blood pressure reading consists of a higher number over a lower number. Ideally, your blood pressure should be below 120/80. The first ("top") number is called the systolic pressure. It is a measure of the pressure in your arteries as your heart beats. The second ("bottom") number is called the diastolic pressure. It is a measure of the pressure in your arteries as the heart relaxes. What are the causes? The cause of this condition is not known. What increases the risk? Some risk factors for high blood pressure are under your control. Others are not. Factors you can change  Smoking.  Having type 2 diabetes mellitus, high cholesterol, or both.  Not getting enough exercise or physical activity.  Being overweight.  Having too much fat, sugar, calories, or salt (sodium) in your diet.  Drinking too much alcohol. Factors that are difficult or impossible to change  Having chronic kidney disease.  Having a family history of high blood pressure.  Age. Risk increases with age.  Race. You may be at higher risk if you are African-American.  Gender. Men are at higher risk than women before age 36. After age 63, women are at higher risk than men.  Having obstructive  sleep apnea.  Stress. What are the signs or symptoms? Extremely high blood pressure (hypertensive crisis) may cause:  Headache.  Anxiety.  Shortness of breath.  Nosebleed.  Nausea and vomiting.  Severe chest pain.  Jerky movements you cannot control (seizures).  How is this diagnosed? This condition is diagnosed by measuring your blood pressure while you are seated, with your arm resting on a surface. The cuff of the blood pressure monitor will be placed directly against the skin of your upper arm at the level of your heart. It should be measured at least twice using the same arm. Certain conditions can cause a difference in blood pressure between your right and left arms. Certain factors can cause blood pressure readings to be lower or higher than normal (elevated) for a short period of time:  When your blood pressure is higher when you are in a health care provider's office than when you are at home, this is called white coat hypertension. Most people with this condition do not need medicines.  When your blood pressure is higher at home than when you are in a health care provider's office, this is called masked hypertension. Most people with this condition may need medicines to control blood pressure.  If you have a high blood pressure reading during one visit or you have normal blood pressure with other risk factors:  You may be asked to return on a different day to have your blood pressure checked again.  You may be asked to monitor your blood pressure at home for 1 week  or longer.  If you are diagnosed with hypertension, you may have other blood or imaging tests to help your health care provider understand your overall risk for other conditions. How is this treated? This condition is treated by making healthy lifestyle changes, such as eating healthy foods, exercising more, and reducing your alcohol intake. Your health care provider may prescribe medicine if lifestyle changes  are not enough to get your blood pressure under control, and if:  Your systolic blood pressure is above 130.  Your diastolic blood pressure is above 80.  Your personal target blood pressure may vary depending on your medical conditions, your age, and other factors. Follow these instructions at home: Eating and drinking  Eat a diet that is high in fiber and potassium, and low in sodium, added sugar, and fat. An example eating plan is called the DASH (Dietary Approaches to Stop Hypertension) diet. To eat this way: ? Eat plenty of fresh fruits and vegetables. Try to fill half of your plate at each meal with fruits and vegetables. ? Eat whole grains, such as whole wheat pasta, brown rice, or whole grain bread. Fill about one quarter of your plate with whole grains. ? Eat or drink low-fat dairy products, such as skim milk or low-fat yogurt. ? Avoid fatty cuts of meat, processed or cured meats, and poultry with skin. Fill about one quarter of your plate with lean proteins, such as fish, chicken without skin, beans, eggs, and tofu. ? Avoid premade and processed foods. These tend to be higher in sodium, added sugar, and fat.  Reduce your daily sodium intake. Most people with hypertension should eat less than 1,500 mg of sodium a day.  Limit alcohol intake to no more than 1 drink a day for nonpregnant women and 2 drinks a day for men. One drink equals 12 oz of beer, 5 oz of wine, or 1 oz of hard liquor. Lifestyle  Work with your health care provider to maintain a healthy body weight or to lose weight. Ask what an ideal weight is for you.  Get at least 30 minutes of exercise that causes your heart to beat faster (aerobic exercise) most days of the week. Activities may include walking, swimming, or biking.  Include exercise to strengthen your muscles (resistance exercise), such as pilates or lifting weights, as part of your weekly exercise routine. Try to do these types of exercises for 30 minutes at  least 3 days a week.  Do not use any products that contain nicotine or tobacco, such as cigarettes and e-cigarettes. If you need help quitting, ask your health care provider.  Monitor your blood pressure at home as told by your health care provider.  Keep all follow-up visits as told by your health care provider. This is important. Medicines  Take over-the-counter and prescription medicines only as told by your health care provider. Follow directions carefully. Blood pressure medicines must be taken as prescribed.  Do not skip doses of blood pressure medicine. Doing this puts you at risk for problems and can make the medicine less effective.  Ask your health care provider about side effects or reactions to medicines that you should watch for. Contact a health care provider if:  You think you are having a reaction to a medicine you are taking.  You have headaches that keep coming back (recurring).  You feel dizzy.  You have swelling in your ankles.  You have trouble with your vision. Get help right away if:  You develop  a severe headache or confusion.  You have unusual weakness or numbness.  You feel faint.  You have severe pain in your chest or abdomen.  You vomit repeatedly.  You have trouble breathing. Summary  Hypertension is when the force of blood pumping through your arteries is too strong. If this condition is not controlled, it may put you at risk for serious complications.  Your personal target blood pressure may vary depending on your medical conditions, your age, and other factors. For most people, a normal blood pressure is less than 120/80.  Hypertension is treated with lifestyle changes, medicines, or a combination of both. Lifestyle changes include weight loss, eating a healthy, low-sodium diet, exercising more, and limiting alcohol. This information is not intended to replace advice given to you by your health care provider. Make sure you discuss any  questions you have with your health care provider. Document Released: 09/11/2005 Document Revised: 08/09/2016 Document Reviewed: 08/09/2016 Elsevier Interactive Patient Education  [employer].

## 2018-03-06 ENCOUNTER — Telehealth: Payer: Self-pay | Admitting: Pulmonary Disease

## 2018-03-06 NOTE — Telephone Encounter (Signed)
Forwarding to [facility] per protocol 

## 2018-03-07 NOTE — Telephone Encounter (Signed)
Thomas Ball responded back pt has benn called and set up for 03/13/18 Thomas Ball

## 2018-03-07 NOTE — Telephone Encounter (Signed)
Sent message to [place] and [email or web address]  to see if cpap has come in yet Joellen [place]

## 2018-03-11 ENCOUNTER — Ambulatory Visit: Payer: Self-pay | Admitting: Pulmonary Disease

## 2018-04-16 ENCOUNTER — Telehealth: Payer: Self-pay | Admitting: Pulmonary Disease

## 2018-04-16 NOTE — Telephone Encounter (Signed)
Thomas Ball is working on this since she did the cpap assistance work

## 2018-04-17 ENCOUNTER — Ambulatory Visit: Payer: Self-pay | Admitting: Pulmonary Disease

## 2018-04-17 NOTE — Telephone Encounter (Signed)
Attempted to call patient today regarding cpap assistance forms. I did not receive an answer at time of call. I have left a voicemail message for pt to return call. X1

## 2018-04-18 NOTE — Telephone Encounter (Signed)
Called and spoke with pt regarding not receiving cpap machine at this time from [employer] program. He states that he paid $100.00 to the program on 01/18/18, paperwork signed, completed and faxed in on 01/23/2018. No response at this time. Pt has had to cancel two appts with [facility] regarding no machine at this time. LVM for s/o at [place] at phone [phone] to call me back regarding this situation Will f/u this week

## 2018-04-19 NOTE — Telephone Encounter (Signed)
LVM for s/o at [place] at phone [phone] to call me back regarding this situation

## 2018-04-19 NOTE — Telephone Encounter (Signed)
LVM for s/o at [place] at phone [phone] to call me back regarding this situation

## 2018-04-22 NOTE — Telephone Encounter (Signed)
LVM for s/o at [place] at phone [phone] to call me back regarding pt's assistance forms Pt advised that he paid $100.00 to the program on 01/18/18, paperwork signed, completed and faxed in on 01/23/2018. No response at this time. Pt has had to cancel two appts with [facility] regarding no machine at this time. Need to find out the status of pt's application

## 2018-04-23 NOTE — Telephone Encounter (Signed)
LVM for s/o at [place] at phone [phone] to call me back regarding pt's assistance forms

## 2018-04-25 NOTE — Telephone Encounter (Signed)
LVM for s/o at [place] at phone [phone] to call me back regardingpt's assistance forms

## 2018-04-26 ENCOUNTER — Ambulatory Visit (INDEPENDENT_AMBULATORY_CARE_PROVIDER_SITE_OTHER): Payer: Self-pay | Admitting: Family Medicine

## 2018-04-26 VITALS — BP 117/72 | HR 59 | Temp 97.9°F | Ht 70.0 in | Wt 299.0 lb

## 2018-04-26 DIAGNOSIS — I10 Essential (primary) hypertension: Secondary | ICD-10-CM | POA: Insufficient documentation

## 2018-04-26 DIAGNOSIS — E1159 Type 2 diabetes mellitus with other circulatory complications: Secondary | ICD-10-CM | POA: Insufficient documentation

## 2018-04-26 DIAGNOSIS — M25552 Pain in left hip: Secondary | ICD-10-CM

## 2018-04-26 MED ORDER — PREDNISONE 10 MG (21) PO TBPK: ORAL_TABLET | ORAL | 0 refills | Status: DC

## 2018-04-26 MED ORDER — DOXAZOSIN MESYLATE 1 MG PO TABS: 1.0000 mg | ORAL_TABLET | Freq: Every day | ORAL | 2 refills | Status: DC

## 2018-04-26 NOTE — Progress Notes (Signed)
Subjective: CC: HTN, joint pain PCP: Timmothy Euler, MD [id number] Thomas Ball is a 57 y.o. male presenting to clinic today for:  1. Hypertension Patient was seen 6 weeks ago for blood pressure.  He was started on Cardura 1 mg daily for treatment of his BPH symptoms as well.  He was instructed to start CPAP machine for obstructive sleep apnea.  He has not gotten his CPAP, citing that he is unable to get in touch with the company that was supposed to send him a refurbished one.  Meds: Compliant with Doxazosin 1mg , Side effects: none.  He reports improvement in BPH symptoms. Denies headache, dizziness, visual changes, nausea, vomiting, chest pain, LE swelling, abdominal pain.  Some shortness of breath with moderate exertion.  2. Joint pain Patient reports Left hip, LE and knee pain.  He notes onset of symptoms was about 1 to 2 years ago.  The pain seems to wax and wane in severity.  He does report that it radiates to the left groin.  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  He is used 800 to 1000 mg of ibuprofen twice daily for the last 2 to 3 weeks.  Denies any numbness or tingling.  He does note that the pain can become so severe that it causes him to feel like his leg will give out.  Denies any overt weakness.  He feels like actions like walking up the stairs seems to worsen pain.  Today pain is a 3/10 but at its worst can get to 7/10.   ROS: Per HPI  No Known Allergies Past Medical History:  Diagnosis Date  . GERD (gastroesophageal reflux disease)   . HCV (hepatitis C virus) DR. ZACKS   AUG 2013 VIRAL LOAD UNDETECTABLE  . Pancreatitis, gallstone   . Skin cancer 1992   removed from side of head  . SVT (supraventricular tachycardia) (HCC)     Current Outpatient Medications:  .  albuterol (PROVENTIL HFA;VENTOLIN HFA) 108 (90 Base) MCG/ACT inhaler, Inhale 2 puffs into the lungs every 4 (four) hours as needed for  wheezing or shortness of breath. (Patient not taking: Reported on 03/04/2018), Disp: 1 Inhaler, Rfl: 0 .  atorvastatin (LIPITOR) 40 MG tablet, Take 1 tablet (40 mg total) by mouth daily at 6 PM., Disp: 90 tablet, Rfl: 4 .  budesonide-formoterol (SYMBICORT) 160-4.5 MCG/ACT inhaler, Inhale 2 puffs into the lungs 2 (two) times daily. (Patient not taking: Reported on 01/08/2018), Disp: 4 Inhaler, Rfl: 0 .  doxazosin (CARDURA) 1 MG tablet, Take 1 tablet (1 mg total) by mouth daily., Disp: 30 tablet, Rfl: 2 .  omeprazole (PRILOSEC) 20 MG capsule, daily (Patient not taking: Reported on 03/04/2018), Disp: 90 capsule, Rfl: 3 Social History   Socioeconomic History  . Marital status: Married    Spouse name: Not on file  . Number of children: Not on file  . Years of education: Not on file  . Highest education level: Not on file  Occupational History  . Occupation: concrete work    Fish farm manager: SELF-EMPLOYED  Social Needs  . Financial resource strain: Not on file  . Food insecurity:    Worry: Not on file    Inability: Not on file  . Transportation needs:    Medical: Not on file    Non-medical: Not on file  Tobacco Use  . Smoking status: Current Every Day Smoker    Packs/day: 1.50  Years: 35.00    Pack years: 52.50    Types: Cigarettes  . Smokeless tobacco: Never Used  . Tobacco comment: had quit for 9 mos, picked up again 2 mos ago  Substance and Sexual Activity  . Alcohol use: No  . Drug use: No    Comment: in the 1980s, smoke crack. None since Dec 2011.   Marland Kitchen Sexual activity: Not on file  Lifestyle  . Physical activity:    Days per week: Not on file    Minutes per session: Not on file  . Stress: Not on file  Relationships  . Social connections:    Talks on phone: Not on file    Gets together: Not on file    Attends religious service: Not on file    Active member of club or organization: Not on file    Attends meetings of clubs or organizations: Not on file    Relationship status: Not  on file  . Intimate partner violence:    Fear of current or ex partner: Not on file    Emotionally abused: Not on file    Physically abused: Not on file    Forced sexual activity: Not on file  Other Topics Concern  . Not on file  Social History Narrative  . Not on file   Family History  Problem Relation Age of Onset  . Emphysema Mother   . COPD Mother   . Early death Mother   . Hyperlipidemia Father   . Stroke Father   . Diabetes Father   . High Cholesterol Father   . Colon cancer Neg Hx     Objective: Office vital signs reviewed. BP 117/72   Pulse (!) 59   Temp 97.9 F (36.6 C) (Oral)   Ht 5\' 10"  (1.778 m)   Wt 299 lb (135.6 kg)   BMI 42.90 kg/m   Physical Examination:  General: Awake, alert, obese, No acute distress Cardio: regular rate and rhythm, S1S2 heard, no murmurs appreciated Pulm: clear to auscultation bilaterally, no wheezes, rhonchi or rales; normal work of breathing on room air Extremities: warm, well perfused, No edema, cyanosis or clubbing; +2 pulses bilaterally MSK: antalgic gait and station  Left hip: He has full active range of motion.  Mild pain with FADIR and FABER.  No tenderness to palpation of the greater trochanter. Skin: dry; intact; no rashes or lesions Neuro: 5/5 LE Strength and light touch sensation grossly intact  Assessment/ Plan: 57 y.o. male   1. Essential hypertension Blood pressure well controlled on current regimen.  This is been refilled for several months. - Basic Metabolic Panel  2. Pain of left hip joint We discussed consideration for imaging versus referral to Ortho if symptoms are not improved by medications.  I advised him against excessive use of ibuprofen.  Will check BMP given excessive dosage over the last several weeks.  Follow-up as needed.   Orders Placed This Encounter  Procedures  . Basic Metabolic Panel   Meds ordered this encounter  Medications  . predniSONE (STERAPRED UNI-PAK 21 TAB) 10 MG (21) TBPK  tablet    Sig: As directed x 6 days    Dispense:  21 tablet    Refill:  0  . doxazosin (CARDURA) 1 MG tablet    Sig: Take 1 tablet (1 mg total) by mouth daily.    Dispense:  90 tablet    Refill:  [facility], DO [place] [phone])  [phone]   

## 2018-04-26 NOTE — Patient Instructions (Signed)
Stop taking the ibuprofen.  You have been taking entirely too much of this.  We are checking your kidney function because you have been taking more than the recommended dose today.  Will contact you on Monday with a result.  I have prescribed you prednisone Dosepak to help with your pain.  If you continue to be symptomatic such that it interfering with your ability to function, we should consider having you see an orthopedist.  Let me know if you want this referral.   Hip Pain The hip is the joint between the upper legs and the lower pelvis. The bones, cartilage, tendons, and muscles of your hip joint support your body and allow you to move around. Hip pain can range from a minor ache to severe pain in one or both of your hips. The pain may be felt on the inside of the hip joint near the groin, or the outside near the buttocks and upper thigh. You may also have swelling or stiffness. Follow these instructions at home: Managing pain, stiffness, and swelling  If directed, apply ice to the injured area. ? Put ice in a plastic bag. ? Place a towel between your skin and the bag. ? Leave the ice on for 20 minutes, 2-3 times a day  Sleep with a pillow between your legs on your most comfortable side.  Avoid any activities that cause pain. General instructions  Take over-the-counter and prescription medicines only as told by your health care provider.  Do any exercises as told by your health care provider.  Record the following: ? How often you have hip pain. ? The location of your pain. ? What the pain feels like. ? What makes the pain worse.  Keep all follow-up visits as told by your health care provider. This is important. Contact a health care provider if:  You cannot put weight on your leg.  Your pain or swelling continues or gets worse after one week.  It gets harder to walk.  You have a fever. Get help right away if:  You fall.  You have a sudden increase in pain and swelling  in your hip.  Your hip is red or swollen or very tender to touch. Summary  Hip pain can range from a minor ache to severe pain in one or both of your hips.  The pain may be felt on the inside of the hip joint near the groin, or the outside near the buttocks and upper thigh.  Avoid any activities that cause pain.  Record how often you have hip pain, the location of the pain, what makes it worse and what it feels like. This information is not intended to replace advice given to you by your health care provider. Make sure you discuss any questions you have with your health care provider. Document Released: 03/01/2010 Document Revised: 08/14/2016 Document Reviewed: 08/14/2016 Elsevier Interactive Patient Education  [employer].

## 2018-04-26 NOTE — Telephone Encounter (Signed)
Attempted to call patient today letting him know that we have tried several times for update on pt asst forms. I did not receive an answer at time of call. I have left a voicemail message for pt to return call. X1  LVM for s/o at [place] at phone [phone] to call me back regardingpt's assistance forms

## 2018-04-27 LAB — BASIC METABOLIC PANEL
BUN / CREAT RATIO: 26 — AB (ref 9–20)
BUN: 23 mg/dL (ref 6–24)
CHLORIDE: 103 mmol/L (ref 96–106)
CO2: 22 mmol/L (ref 20–29)
Calcium: 9.1 mg/dL (ref 8.7–10.2)
Creatinine, Ser: 0.89 mg/dL (ref 0.76–1.27)
GFR calc Af Amer: 111 mL/min/{1.73_m2} (ref >59)
GFR calc non Af Amer: 96 mL/min/{1.73_m2} (ref >59)
GLUCOSE: 120 mg/dL — AB (ref 65–99)
Potassium: 4.8 mmol/L (ref 3.5–5.2)
SODIUM: 140 mmol/L (ref 134–144)

## 2018-04-29 ENCOUNTER — Telehealth: Payer: Self-pay | Admitting: Pulmonary Disease

## 2018-04-29 DIAGNOSIS — G4733 Obstructive sleep apnea (adult) (pediatric): Secondary | ICD-10-CM

## 2018-04-29 NOTE — Telephone Encounter (Signed)
Called and spoke with pt regarding CPAP machine Cannot get a hold of anyone with status of pt asst forms for cpap machine Pt and myself has tried numerous times in last few weeks. Pt requesting rx of cpap machine be sent to [place] states [facility] is willing to rent machine to pt during time of getting set up with assistance. Placed order today to [facility] Nothing further needed at this time.

## 2018-04-29 NOTE — Telephone Encounter (Signed)
Attempted to call pt but unable to reach.  Left message for pt to return call x1. 

## 2018-04-29 NOTE — Telephone Encounter (Signed)
Pt is returning call. Cb is [phone].

## 2018-04-30 NOTE — Telephone Encounter (Signed)
Spoke with pt 04/29/18 regarding pt assistance forms.  Pt is requesting order be sent to [facility] for cpap machine [facility] is allowing pt to rent cpap machine Order has been placed Nothing further needed.

## 2018-05-01 ENCOUNTER — Telehealth: Payer: Self-pay | Admitting: Family Medicine

## 2018-05-01 ENCOUNTER — Other Ambulatory Visit: Payer: Self-pay | Admitting: Family Medicine

## 2018-05-01 DIAGNOSIS — M25552 Pain in left hip: Secondary | ICD-10-CM

## 2018-05-01 NOTE — Telephone Encounter (Signed)
Will refer to orthopedics

## 2018-05-01 NOTE — Telephone Encounter (Signed)
Patient aware of recommendation.  

## 2018-05-01 NOTE — Telephone Encounter (Signed)
Patient seen Dr. Darnell Level 8/2 for left hip pain and states the prednisone is not helping. Wanting to know what other options are? Please advise

## 2018-05-09 ENCOUNTER — Ambulatory Visit (INDEPENDENT_AMBULATORY_CARE_PROVIDER_SITE_OTHER): Payer: Self-pay | Admitting: Orthopaedic Surgery

## 2018-05-16 ENCOUNTER — Ambulatory Visit (INDEPENDENT_AMBULATORY_CARE_PROVIDER_SITE_OTHER): Payer: Self-pay

## 2018-05-16 ENCOUNTER — Encounter (INDEPENDENT_AMBULATORY_CARE_PROVIDER_SITE_OTHER): Payer: Self-pay | Admitting: Orthopaedic Surgery

## 2018-05-16 ENCOUNTER — Ambulatory Visit (INDEPENDENT_AMBULATORY_CARE_PROVIDER_SITE_OTHER): Payer: Self-pay | Admitting: Orthopaedic Surgery

## 2018-05-16 VITALS — BP 124/86 | HR 65 | Ht 65.0 in | Wt 280.0 lb

## 2018-05-16 DIAGNOSIS — M25552 Pain in left hip: Secondary | ICD-10-CM

## 2018-05-16 NOTE — Progress Notes (Signed)
Office Visit Note   Patient: Thomas Ball           Date of Birth: 15-Jan-1961           MRN: [id number] Visit Date: 05/16/2018              Requested by: [name], DO [address] [place], [place] [address] PCP: [name], DO   Assessment & Plan: Visit Diagnoses:  1. Pain in left hip     Plan: Patient may have some hip flexor tendinopathy or inflammation.  No evidence of hip synovitis.  No associated back pain and no sensory deficit in either lower extremity.  He will work on weight loss, dieting can use a cane in his right hand appropriate sequence discussed.  Has a walker he can use when his pain is worse.  He is gained about 40 pounds and needs to work on getting some this weight off to help unload his hip.  X-rays were negative for hip osteoarthritis or fracture.  Will return of his symptoms increase.  Follow-Up Instructions: No follow-ups on file.   Orders:  Orders Placed This Encounter  Procedures  . XR HIP UNILAT W OR W/O PELVIS 2-3 VIEWS LEFT   No orders of the defined types were placed in this encounter.     Procedures: No procedures performed   Clinical Data: No additional findings.   Subjective: Chief Complaint  Patient presents with  . Left Hip - Pain    HPI 57 year old male is had problems with some left groin pain with ambulation for years he has to be careful with stairs sometimes it feels like his hip will give way.  He has a cane that he is tried to use but did not use it in his right hand and is also have access to a walker which helps with use.  He has to use the rail when he goes up and down stairs he is used some Advil.  He is placed on prednisone Dosepak and this did not really help.  He is used plain Tylenol.  He denies associated back pain no chills or fever.  Review of Systems 14 point review of systems positive for GERD history of cirrhosis of the liver with past history of significant alcohol use not drinking since 2011.   Positive for obesity tobacco abuse prostatic hypertrophy, significant sleep apnea, hypertension, pulmonary nodule, intermittent palpitations otherwise negative as it pertains HPI.   Objective: Vital Signs: BP 124/86   Pulse 65   Ht 5\' 5"  (1.651 m)   Wt 280 lb (127 kg)   BMI 46.59 kg/m   Physical Exam  Constitutional: He is oriented to person, place, and time. He appears well-developed and well-nourished.  HENT:  Head: Normocephalic and atraumatic.  Eyes: Pupils are equal, round, and reactive to light. EOM are normal.  Neck: No tracheal deviation present. No thyromegaly present.  Cardiovascular: Normal rate.  Pulmonary/Chest: Effort normal. He has no wheezes.  Abdominal: Soft. Bowel sounds are normal. There is no tenderness.  Morbid obesity  Neurological: He is alert and oriented to person, place, and time.  Skin: Skin is warm and dry. Capillary refill takes less than 2 seconds.  Psychiatric: He has a normal mood and affect. His behavior is normal. Judgment and thought content normal.    Ortho Exam patient has no pain with logroll of his hips symmetrical 30 to 40 degrees internal rotation without pain both hips.  Mild trochanteric bursal  tenderness right and left.  He has weakness of left hip flexor normal on the right.  No quad weakness anterior tib gastrocsoleus are strong pedal pulses are palpable.  He ambulates with a slight left hip limp.  Slow short stride gait related to body habitus with morbid obesity.  No sciatic notch tenderness.  Popliteal compression test.  Specialty Comments:  No specialty comments available.  Imaging: No results found.   PMFS History: Patient Active Problem List   Diagnosis Date Noted  . Essential hypertension 04/26/2018  . OSA (obstructive sleep apnea) 03/04/2018  . BPH (benign prostatic hyperplasia) 10/07/2015  . EIC (epidermal inclusion cyst) 07/23/2015  . Tobacco abuse 07/02/2015  . SVT (supraventricular tachycardia) ([place])   .  Intermittent palpitations 04/21/2014  . Bradycardia 04/21/2014  . Obesity 04/21/2014  . Chest pain 04/20/2014  . Cirrhosis of liver ([place]) 11/05/2013  . GERD (gastroesophageal reflux disease) 07/18/2012  . Screening for colorectal cancer 07/18/2012  . Pulmonary nodule 02/14/2012   Past Medical History:  Diagnosis Date  . GERD (gastroesophageal reflux disease)   . HCV (hepatitis C virus) DR. ZACKS   AUG 2013 VIRAL LOAD UNDETECTABLE  . Pancreatitis, gallstone   . Skin cancer 1992   removed from side of head  . SVT (supraventricular tachycardia) ([facility])     Family History  Problem Relation Age of Onset  . Emphysema Mother   . COPD Mother   . Early death Mother   . Hyperlipidemia Father   . Stroke Father   . Diabetes Father   . High Cholesterol Father   . Colon cancer Neg Hx     Past Surgical History:  Procedure Laterality Date  . 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 CHOLECYSTECTOMY    . COLONOSCOPY  10/04/2012   SLF:ONE/8 SIMPLE ADENOMA, TICS, SML IH  . ESOPHAGOGASTRODUODENOSCOPY N/A 09/04/2014   Procedure: ESOPHAGOGASTRODUODENOSCOPY (EGD);  Surgeon: Danie Binder, MD;  Location[facility];  Service: Endoscopy;  Laterality: N/A;  145  . GALLBLADDER SURGERY    . JAW BROKEN    . LIPOMA REMOVALS     SEVERAL  . SUPRAVENTRICULAR TACHYCARDIA ABLATION N/A 06/22/2014   Procedure: SUPRAVENTRICULAR TACHYCARDIA ABLATION;  Surgeon: Evans Lance, MD;  Location: [facility] CATH LAB;  Service: Cardiovascular;  Laterality: N/A;   Social History   Occupational History  . Occupation: concrete work    Fish farm manager: SELF-EMPLOYED  Tobacco Use  . Smoking status: Current Every Day Smoker    Packs/day: 1.50    Years: 35.00    Pack years: 52.50    Types: Cigarettes  . Smokeless tobacco: Never Used  . Tobacco comment: had quit for 9 mos, picked up again 2 mos ago  Substance and Sexual Activity  . Alcohol use: No  . Drug use: No    Comment: in the 1980s, smoke crack. None since  Dec 2011.   Marland Kitchen Sexual activity: Not on file

## 2018-05-17 ENCOUNTER — Other Ambulatory Visit: Payer: Self-pay | Admitting: *Deleted

## 2018-05-17 MED ORDER — OMEPRAZOLE 20 MG PO CPDR: 20.0000 mg | DELAYED_RELEASE_CAPSULE | Freq: Every day | ORAL | 0 refills | Status: DC

## 2018-05-20 IMAGING — DX DG CHEST 2V
2 series · 2 of 2 positions shown · non-contrast
Comparison: 04/15/2017

CLINICAL DATA: SOB, Reports of shortness of breath and cough x2
days. Recently dx with pneumonia. Also reports of chest pain that
started today HISTORY OF GERD, HCV, CANCER, SVT, PANCREATITIS

EXAM:
CHEST  2 VIEW

[chest pa]
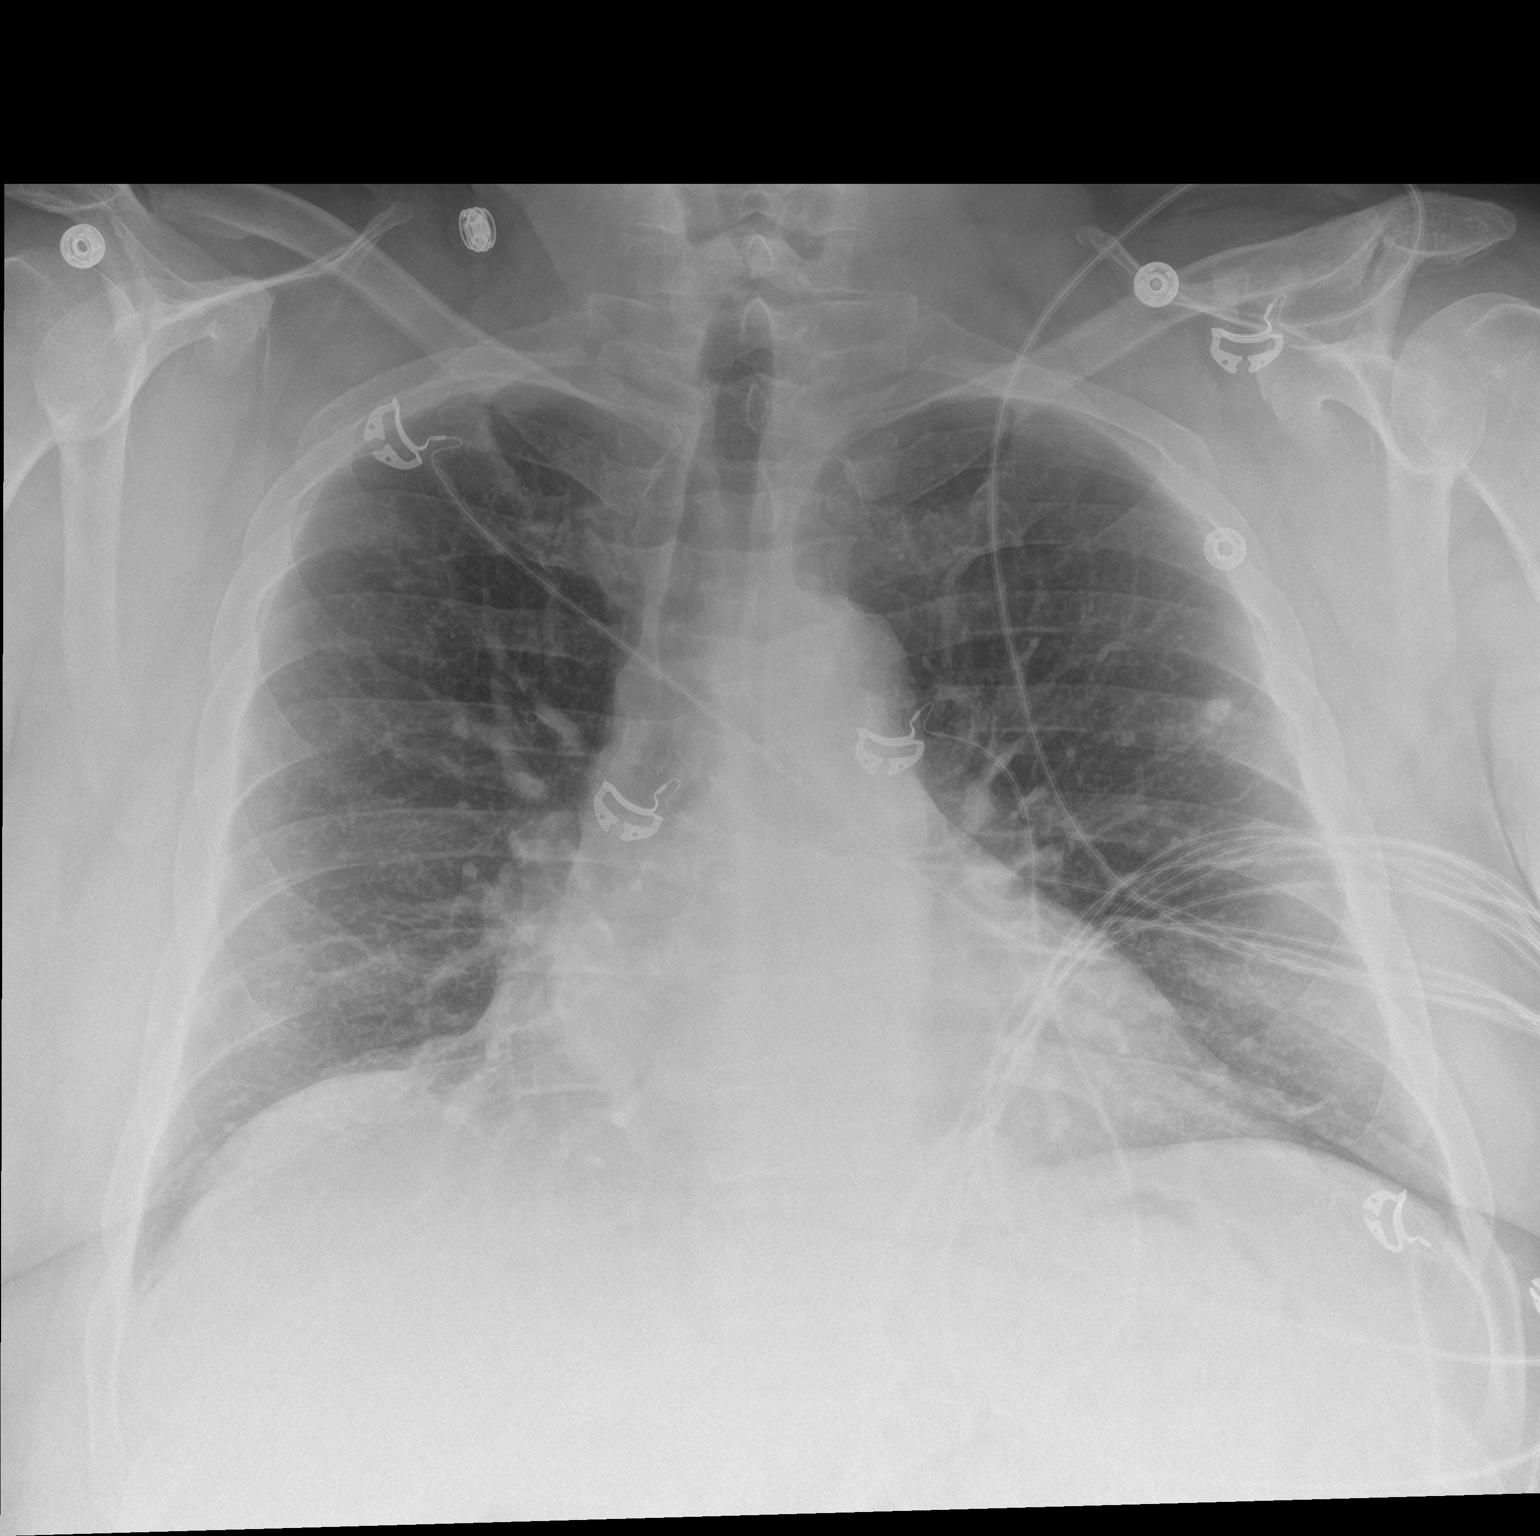

[chest lat]
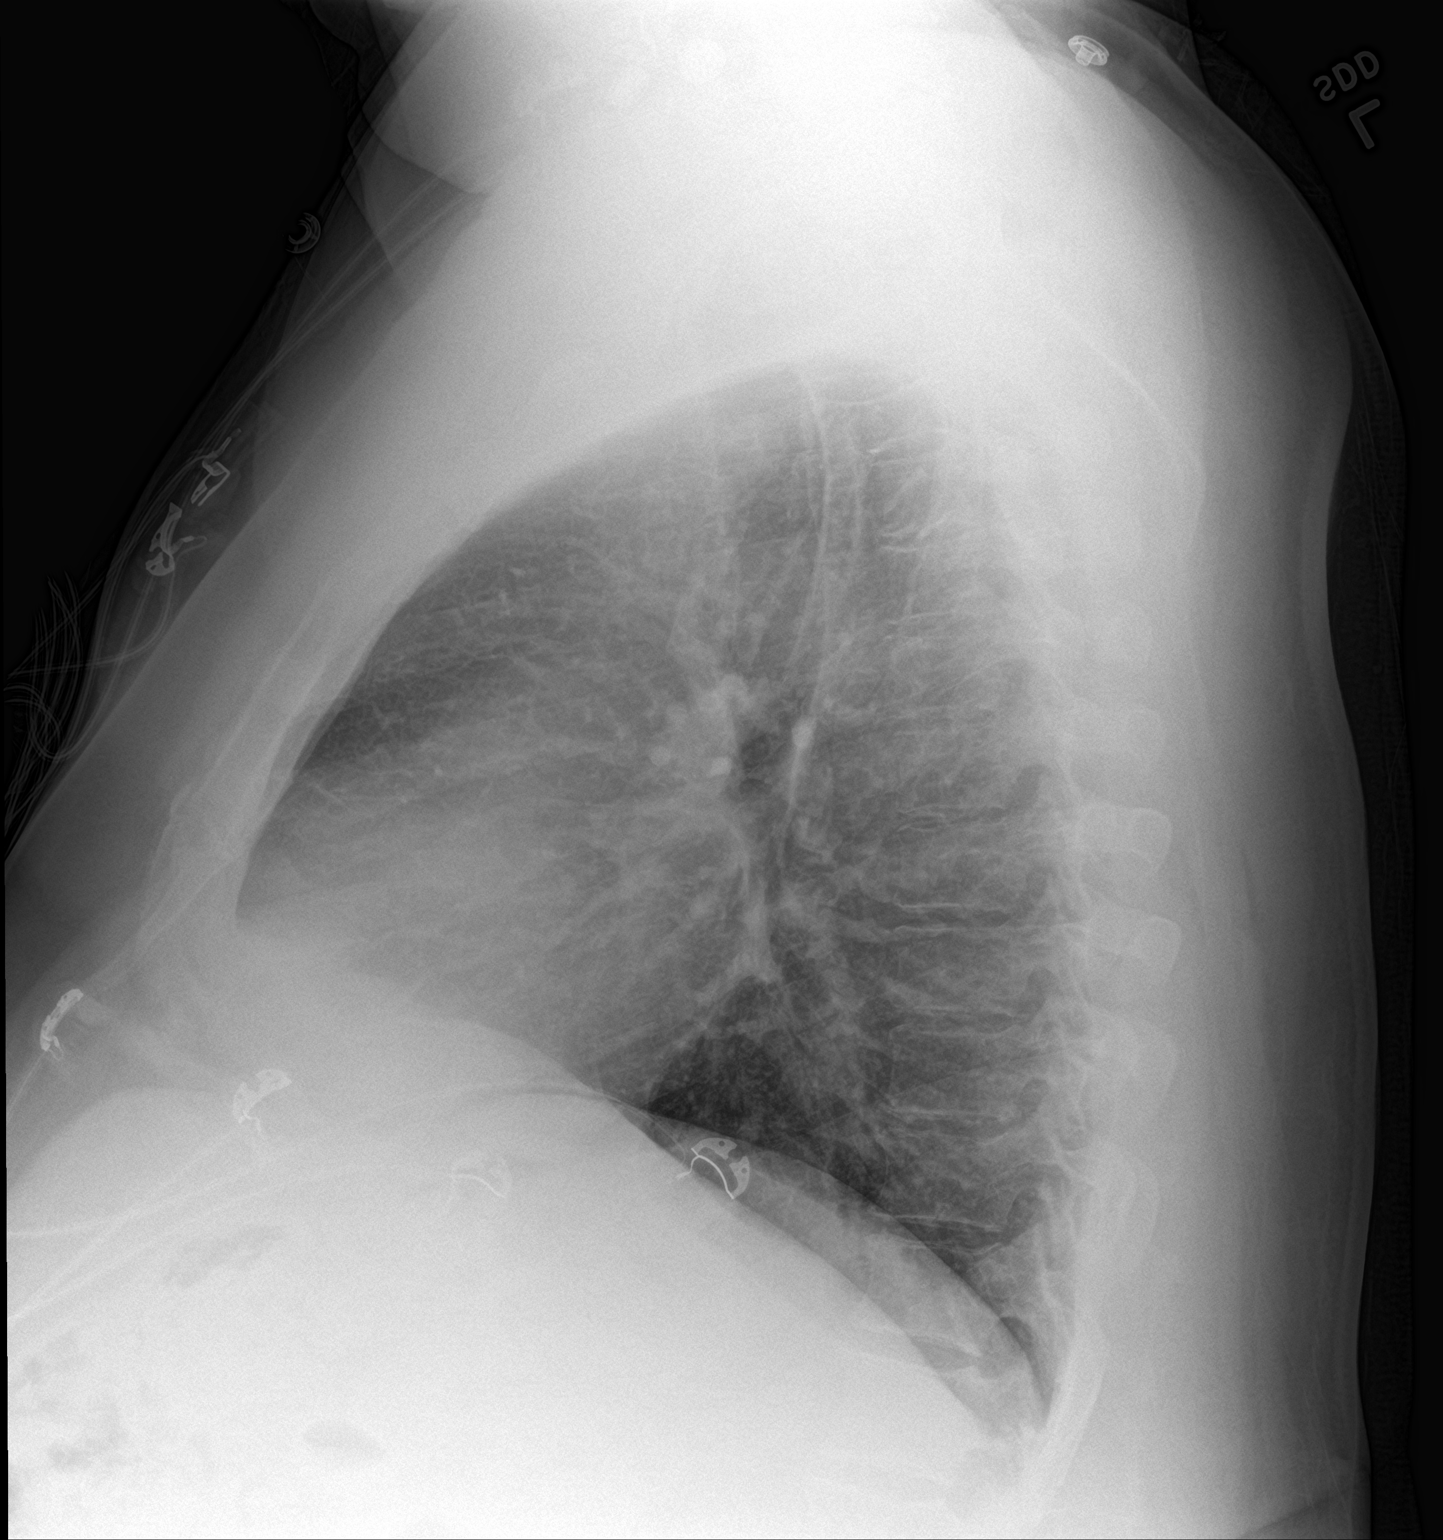

[2 of 2 positions shown; findings below may reference images not displayed]

FINDINGS: The heart is enlarged. There is minimal bibasilar atelectasis. No
focal consolidations or pleural effusions are identified. No
pulmonary edema. Left upper lobe granuloma.
IMPRESSION: Minimal bibasilar atelectasis. Improved aeration in the right middle
lobe.

## 2018-05-20 IMAGING — CT CT ANGIO CHEST
2 of 6 series · 18 of 46 positions shown · IV contrast (Isovue)
Comparison: Chest radiographs 2935 hours today and earlier. Chest
CTs 12/10/2013 and earlier.

CLINICAL DATA: 55-year-old male with cough and shortness of breath
for 2 days. Chest pain starting today. Abnormal D-dimer.

EXAM:
CT ANGIOGRAPHY CHEST WITH CONTRAST
TECHNIQUE: Multidetector CT imaging of the chest was performed using the
standard protocol during bolus administration of intravenous
contrast. Multiplanar CT image reconstructions and MIPs were
obtained to evaluate the vascular anatomy.
CONTRAST:  100 mL Isovue 370

[Series 7: thins · axial · 0.92mm/px · z∈[+982,+1297]mm · 15 of 345 slices shown]
[im 15/345  lung]
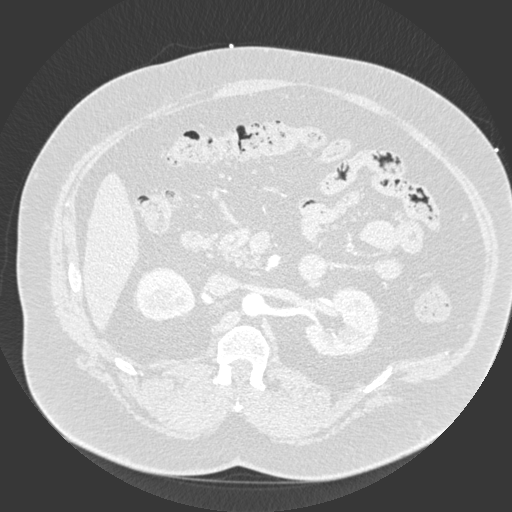
[im 45/345  soft-tissue]
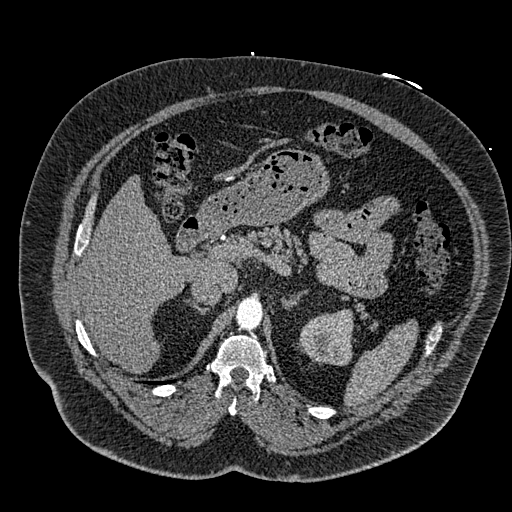
[im 60/345  lung]
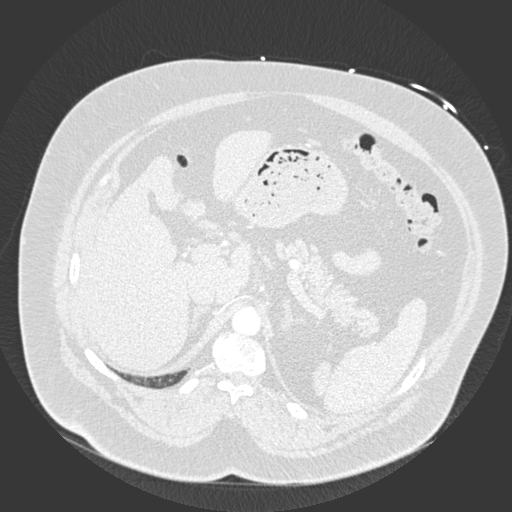
[im 90/345  soft-tissue]
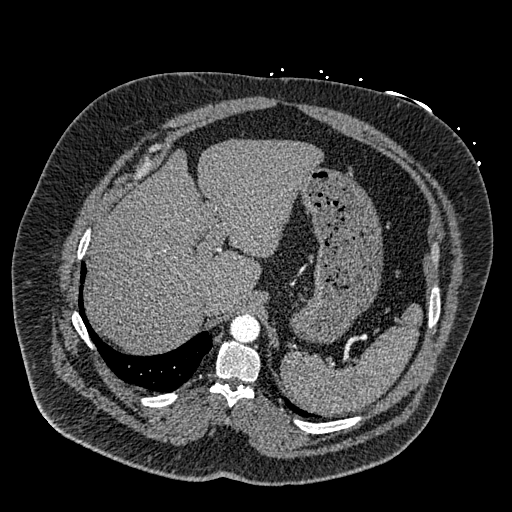
[im 105/345  lung]
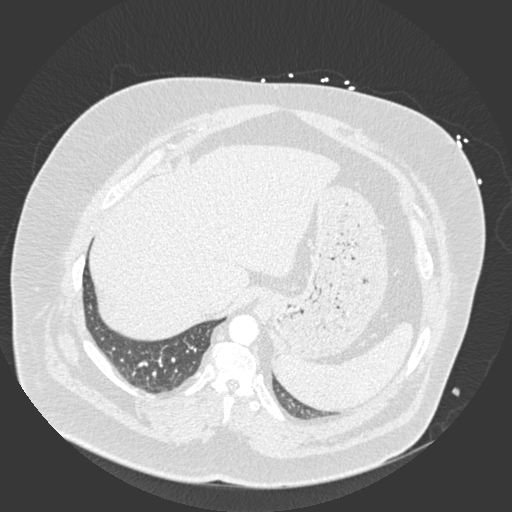
[im 135/345  soft-tissue]
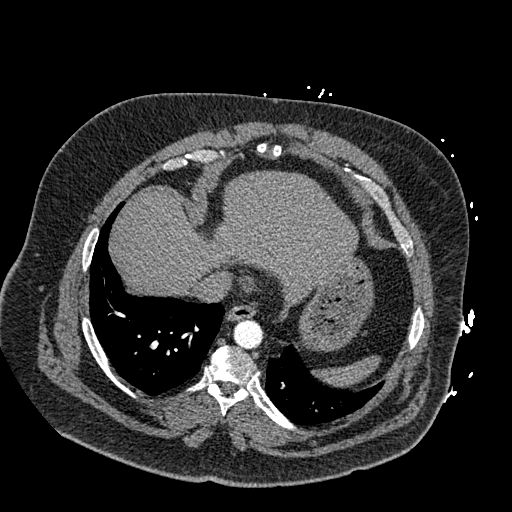
[im 150/345  lung]
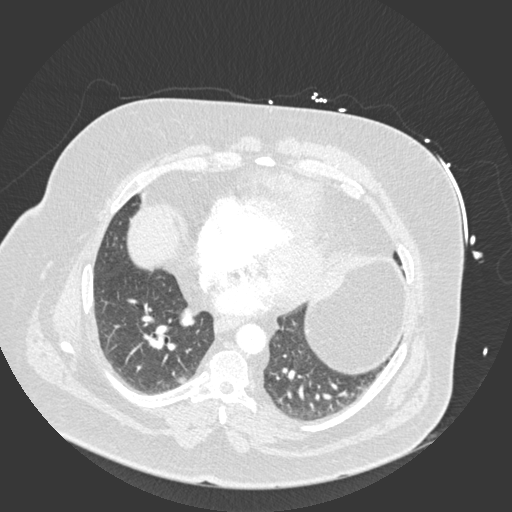
[im 180/345  soft-tissue]
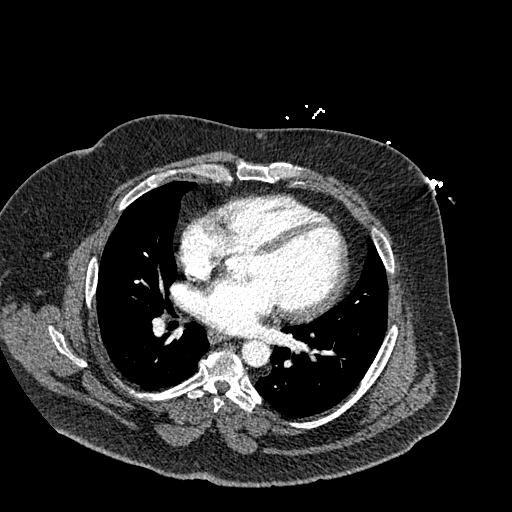
[im 195/345  lung]
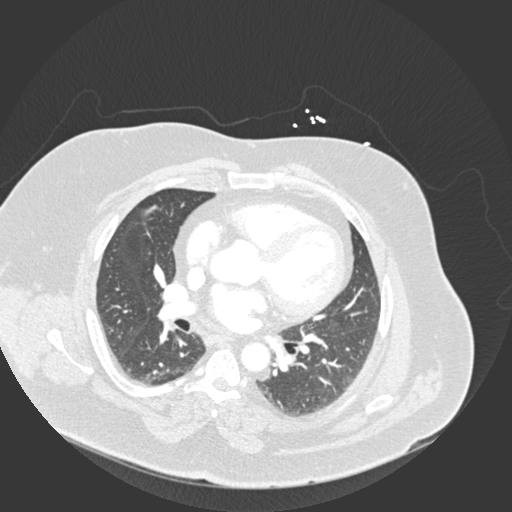
[im 210/345  soft-tissue]
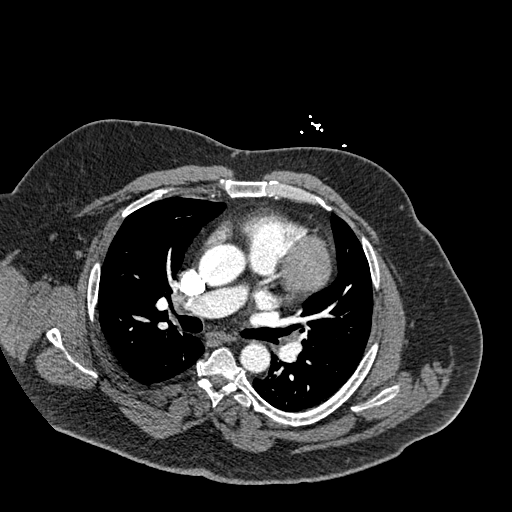
[im 240/345  lung]
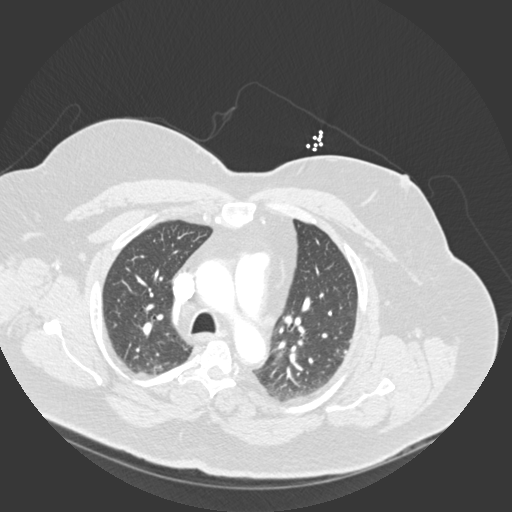
[im 255/345  soft-tissue]
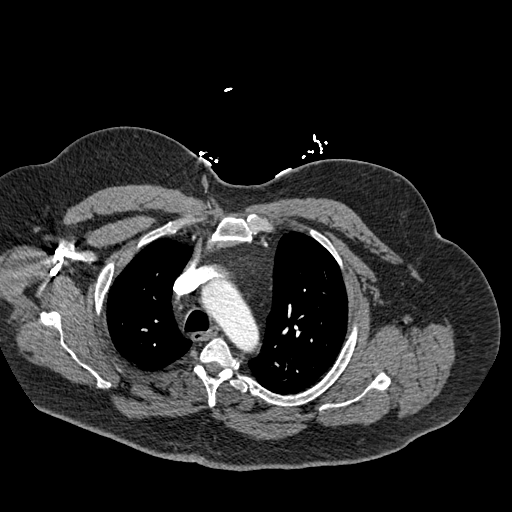
[im 285/345  lung]
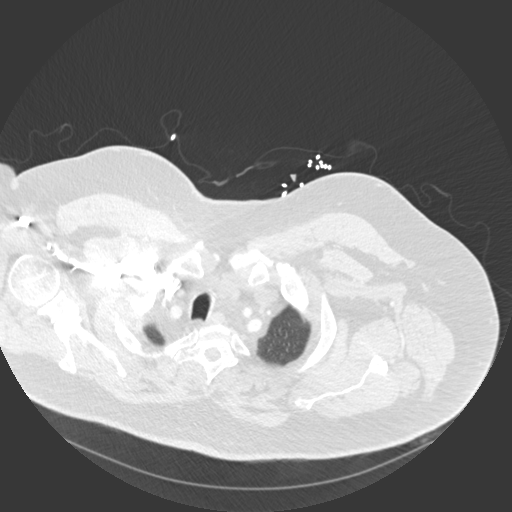
[im 300/345  soft-tissue]
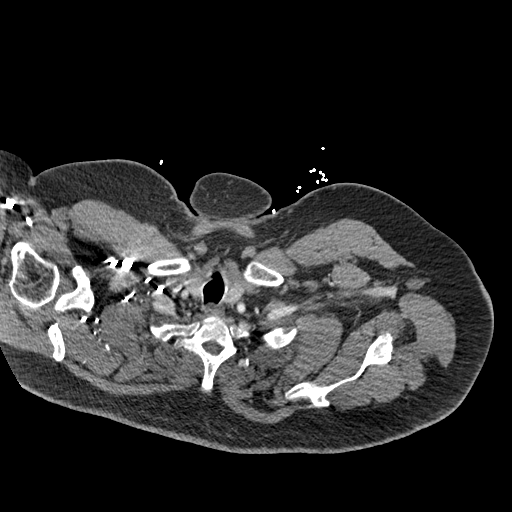
[im 330/345  lung]
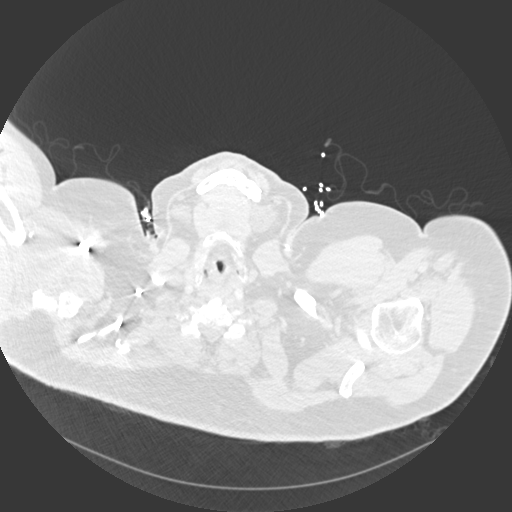

[Series 9: coronal mpr · coronal · 0.67mm/px · 3 of 198 slices shown]
[im 50/198  soft-tissue]
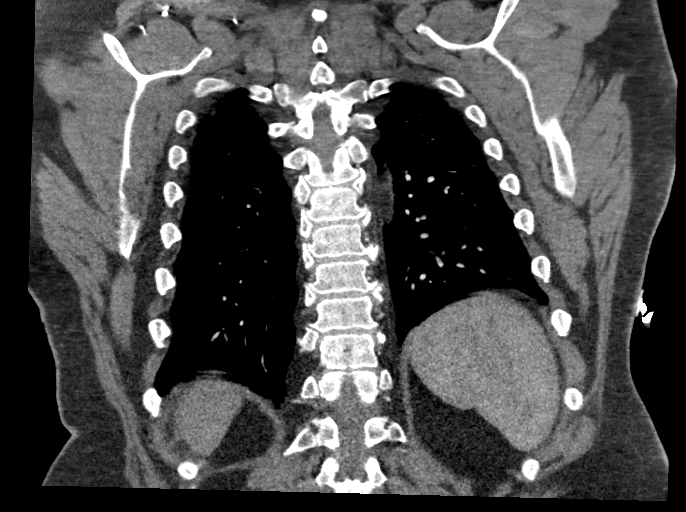
[im 99/198  soft-tissue]
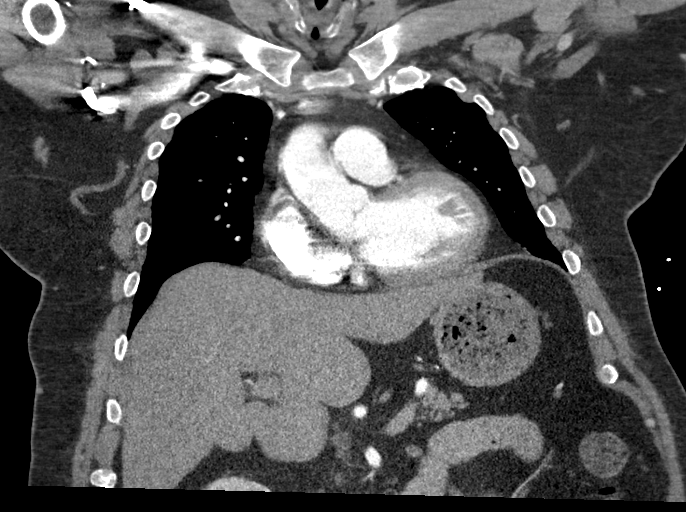
[im 148/198  soft-tissue]
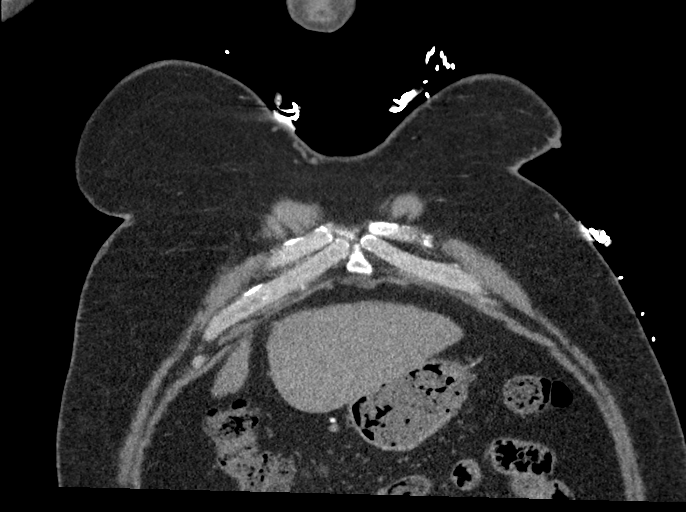

[18 of 46 positions shown; findings below may reference images not displayed]

FINDINGS: Cardiovascular: Adequate contrast bolus timing in the pulmonary
arterial tree. Mild lower lobe respiratory motion. No focal filling
defect identified in the pulmonary arteries to suggest acute
pulmonary embolism.

Mild cardiomegaly. No pericardial effusion. Negative visualized
aorta.

Mediastinum/Nodes: Negative.  No lymphadenopathy.

Lungs/Pleura: Major airways are patent. Lower lung volumes compared
to 7678. Lateral left upper lobe calcified granuloma with mild
adjacent peribronchial opacity, unchanged since 9035. No acute
pulmonary opacity. No pleural effusion.

Upper Abdomen: Stable visualized liver. The gallbladder appears to
be surgically absent. Negative visible pancreas, spleen, adrenal
glands, kidneys, and bowel in the upper abdomen.

Musculoskeletal: No acute osseous abnormality identified.

Review of the MIP images confirms the above findings.
IMPRESSION: Negative for acute pulmonary embolus. No acute findings in the
chest.

## 2018-06-11 ENCOUNTER — Encounter: Payer: Self-pay | Admitting: Family Medicine

## 2018-06-11 ENCOUNTER — Other Ambulatory Visit: Payer: Self-pay

## 2018-06-11 ENCOUNTER — Encounter (HOSPITAL_COMMUNITY): Payer: Self-pay | Admitting: Emergency Medicine

## 2018-06-11 ENCOUNTER — Telehealth: Payer: Self-pay | Admitting: Family Medicine

## 2018-06-11 ENCOUNTER — Ambulatory Visit (INDEPENDENT_AMBULATORY_CARE_PROVIDER_SITE_OTHER): Payer: Self-pay | Admitting: Family Medicine

## 2018-06-11 ENCOUNTER — Emergency Department (HOSPITAL_COMMUNITY)
Admission: EM | Admit: 2018-06-11 | Discharge: 2018-06-11 | Disposition: A | Discharge status: Home / Self Care | Payer: Self-pay | Attending: Emergency Medicine | Admitting: Emergency Medicine

## 2018-06-11 ENCOUNTER — Emergency Department (HOSPITAL_COMMUNITY): Payer: Self-pay

## 2018-06-11 VITALS — BP 151/90 | HR 64 | Temp 98.7°F | Ht 65.0 in | Wt 297.0 lb

## 2018-06-11 DIAGNOSIS — R079 Chest pain, unspecified: Secondary | ICD-10-CM

## 2018-06-11 DIAGNOSIS — S51812A Laceration without foreign body of left forearm, initial encounter: Secondary | ICD-10-CM

## 2018-06-11 DIAGNOSIS — I10 Essential (primary) hypertension: Secondary | ICD-10-CM

## 2018-06-11 DIAGNOSIS — K0889 Other specified disorders of teeth and supporting structures: Secondary | ICD-10-CM

## 2018-06-11 DIAGNOSIS — Z85820 Personal history of malignant melanoma of skin: Secondary | ICD-10-CM | POA: Insufficient documentation

## 2018-06-11 DIAGNOSIS — E669 Obesity, unspecified: Secondary | ICD-10-CM | POA: Insufficient documentation

## 2018-06-11 DIAGNOSIS — F17210 Nicotine dependence, cigarettes, uncomplicated: Secondary | ICD-10-CM | POA: Insufficient documentation

## 2018-06-11 DIAGNOSIS — Z79899 Other long term (current) drug therapy: Secondary | ICD-10-CM | POA: Insufficient documentation

## 2018-06-11 DIAGNOSIS — R0789 Other chest pain: Secondary | ICD-10-CM | POA: Insufficient documentation

## 2018-06-11 HISTORY — DX: Pure hypercholesterolemia, unspecified: E78.00

## 2018-06-11 HISTORY — DX: Essential (primary) hypertension: I10

## 2018-06-11 LAB — BASIC METABOLIC PANEL
Anion gap: 6 (ref 5–15)
BUN: 16 mg/dL (ref 6–20)
CALCIUM: 8.6 mg/dL — AB (ref 8.9–10.3)
CO2: 27 mmol/L (ref 22–32)
CREATININE: 0.96 mg/dL (ref 0.61–1.24)
Chloride: 102 mmol/L (ref 98–111)
GFR calc Af Amer: >60 mL/min (ref >60)
GFR calc non Af Amer: >60 mL/min (ref >60)
GLUCOSE: 172 mg/dL — AB (ref 70–99)
Potassium: 3.9 mmol/L (ref 3.5–5.1)
Sodium: 135 mmol/L (ref 135–145)

## 2018-06-11 LAB — CBC
HCT: 43.6 % (ref 39.0–52.0)
HEMOGLOBIN: 15.1 g/dL (ref 13.0–17.0)
MCH: 33.1 pg (ref 26.0–34.0)
MCHC: 34.6 g/dL (ref 30.0–36.0)
MCV: 95.6 fL (ref 78.0–100.0)
PLATELETS: 137 10*3/uL — AB (ref 150–400)
RBC: 4.56 MIL/uL (ref 4.22–5.81)
RDW: 13.6 % (ref 11.5–15.5)
WBC: 5.8 10*3/uL (ref 4.0–10.5)

## 2018-06-11 LAB — I-STAT TROPONIN, ED: TROPONIN I, POC: 0 ng/mL (ref 0.00–0.08)

## 2018-06-11 MED ORDER — AMOXICILLIN-POT CLAVULANATE 875-125 MG PO TABS: 1.0000 | ORAL_TABLET | Freq: Two times a day (BID) | ORAL | 0 refills | Status: DC

## 2018-06-11 MED ORDER — NAPROXEN 500 MG PO TABS: 500.0000 mg | ORAL_TABLET | Freq: Two times a day (BID) | ORAL | 0 refills | Status: DC | PRN

## 2018-06-11 NOTE — ED Provider Notes (Signed)
[facility] EMERGENCY DEPARTMENT Provider Note   CSN: [id number] Arrival date & time: 06/11/18  1748     History   Chief Complaint Chief Complaint  Patient presents with  . Hypertension  . Chest Pain    HPI Thomas Ball is a 57 y.o. male.  HPI  Patient is a morbidly obese 57 year old male with a history of high blood pressure, high cholesterol and acid reflux.  He is also known to have a history of SVT status post ablation, obstructive sleep apnea, not yet started on BiPAP or CPAP and has only recently started on blood pressure medications because he has had no significant problems long-term with his blood pressure.  He states that he was actually seen earlier today at his doctor's office and had his blood pressure medication increased, they doubled the dose of Cardura, he states that he took a home blood pressure cuff and started to measure his blood pressure and has noticed that his wrist cuff is measuring everything from 60 systolic to 545 systolic with some varying blood pressures in the middle.  Throughout all of this the patient is essentially asymptomatic.  He also reports that for the last couple of days he has had a very slight twinge of abnormal chest discomfort in the left upper chest just left of the sternum.  He states this is not exertional or positional or related to breathing, he is not febrile coughing or short of breath more than usual.  He does note that he is chronically short of breath because of his central obesity.  There is no swelling of the legs, he is not getting dyspneic when he walks, he does not have any exertional symptoms and has had a negative stress test that he reports in the past.  Past Medical History:  Diagnosis Date  . GERD (gastroesophageal reflux disease)   . HCV (hepatitis C virus) DR. ZACKS   AUG 2013 VIRAL LOAD UNDETECTABLE  . Hypercholesteremia   . Hypertension   . Pancreatitis, gallstone   . Skin cancer 1992   removed from side of  head  . SVT (supraventricular tachycardia) [facility])     Patient Active Problem List   Diagnosis Date Noted  . Essential hypertension 04/26/2018  . OSA (obstructive sleep apnea) 03/04/2018  . BPH (benign prostatic hyperplasia) 10/07/2015  . EIC (epidermal inclusion cyst) 07/23/2015  . Tobacco abuse 07/02/2015  . SVT (supraventricular tachycardia) ([place])   . Intermittent palpitations 04/21/2014  . Bradycardia 04/21/2014  . Obesity 04/21/2014  . Chest pain 04/20/2014  . Cirrhosis of liver ([place]) 11/05/2013  . GERD (gastroesophageal reflux disease) 07/18/2012  . Screening for colorectal cancer 07/18/2012  . Pulmonary nodule 02/14/2012    Past Surgical History:  Procedure Laterality Date  . 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 CHOLECYSTECTOMY    . COLONOSCOPY  10/04/2012   SLF:ONE/8 SIMPLE ADENOMA, TICS, SML IH  . ESOPHAGOGASTRODUODENOSCOPY N/A 09/04/2014   Procedure: ESOPHAGOGASTRODUODENOSCOPY (EGD);  Surgeon: Danie Binder, MD;  Location[facility];  Service: Endoscopy;  Laterality: N/A;  145  . GALLBLADDER SURGERY    . JAW BROKEN    . LIPOMA REMOVALS     SEVERAL  . SUPRAVENTRICULAR TACHYCARDIA ABLATION N/A 06/22/2014   Procedure: SUPRAVENTRICULAR TACHYCARDIA ABLATION;  Surgeon: Evans Lance, MD;  Location: [facility] CATH LAB;  Service: Cardiovascular;  Laterality: N/A;        Home Medications    Prior to Admission medications   Medication  Sig Start Date End Date Taking? Authorizing Provider  amoxicillin-clavulanate (AUGMENTIN) 875-125 MG tablet Take 1 tablet by mouth 2 (two) times daily. 06/11/18   [name], DO  atorvastatin (LIPITOR) 40 MG tablet Take 1 tablet (40 mg total) by mouth daily at 6 PM. 03/04/18 06/11/18  [name], DO  doxazosin (CARDURA) 1 MG tablet Take 1 tablet (1 mg total) by mouth daily. 04/26/18   [name], DO  naproxen (NAPROSYN) 500 MG tablet Take 1 tablet (500 mg total) by mouth 2 (two) times daily as needed for  moderate pain (dental). 06/11/18   [name], DO  omeprazole (PRILOSEC) 20 MG capsule Take 1 capsule (20 mg total) by mouth daily. 05/17/18   [name], DO    Family History Family History  Problem Relation Age of Onset  . Emphysema Mother   . COPD Mother   . Early death Mother   . Hyperlipidemia Father   . Stroke Father   . Diabetes Father   . High Cholesterol Father   . Colon cancer Neg Hx     Social History Social History   Tobacco Use  . Smoking status: Current Every Day Smoker    Packs/day: 1.50    Years: 35.00    Pack years: 52.50    Types: Cigarettes  . Smokeless tobacco: Never Used  . Tobacco comment: had quit for 9 mos, picked up again 2 mos ago  Substance Use Topics  . Alcohol use: No  . Drug use: No    Comment: in the 1980s, smoke crack. None since Dec 2011.      Allergies   Patient has no known allergies.   Review of Systems Review of Systems  Constitutional: Negative for chills and fever.  HENT: Negative for sore throat.   Eyes: Negative for visual disturbance.  Respiratory: Positive for shortness of breath. Negative for cough.   Cardiovascular: Positive for chest pain.  Gastrointestinal: Negative for abdominal pain, diarrhea, nausea and vomiting.  Genitourinary: Negative for dysuria and frequency.  Musculoskeletal: Negative for back pain and neck pain.  Skin: Negative for rash.  Neurological: Negative for weakness, numbness and headaches.  Hematological: Negative for adenopathy.  Psychiatric/Behavioral: Negative for behavioral problems.     Physical Exam Updated Vital Signs BP 134/80   Pulse 64   Temp 98.1 F (36.7 C) (Oral)   Resp 16   Ht 1.651 m (5\' 5" )   Wt 134.7 kg   SpO2 94%   BMI 49.42 kg/m   Physical Exam  Constitutional: He appears well-developed and well-nourished. No distress.  HENT:  Head: Normocephalic and atraumatic.  Mouth/Throat: Oropharynx is clear and moist. No oropharyngeal exudate.  Eyes:  Pupils are equal, round, and reactive to light. Conjunctivae and EOM are normal. Right eye exhibits no discharge. Left eye exhibits no discharge. No scleral icterus.  Neck: Normal range of motion. Neck supple. No JVD present. No thyromegaly present.  Cardiovascular: Normal rate, regular rhythm, normal heart sounds and intact distal pulses. Exam reveals no gallop and no friction rub.  No murmur heard. Pulmonary/Chest: Effort normal and breath sounds normal. No respiratory distress. He has no wheezes. He has no rales.  Abdominal: Soft. Bowel sounds are normal. He exhibits no distension and no mass. There is no tenderness.  The patient is morbidly obese but there is no tenderness to his abdomen  Musculoskeletal: Normal range of motion. He exhibits no edema or tenderness.  Lymphadenopathy:    He has no  cervical adenopathy.  Neurological: He is alert. Coordination normal.  Skin: Skin is warm and dry. No rash noted. No erythema.  Psychiatric: He has a normal mood and affect. His behavior is normal.  Nursing note and vitals reviewed.    ED Treatments / Results  Labs (all labs ordered are listed, but only abnormal results are displayed) Labs Reviewed  BASIC METABOLIC PANEL - Abnormal; Notable for the following components:      Result Value   Glucose, Bld 172 (*)    Calcium 8.6 (*)    All other components within normal limits  CBC - Abnormal; Notable for the following components:   Platelets 137 (*)    All other components within normal limits  I-STAT TROPONIN, ED    EKG EKG Interpretation  Date/Time:  Tuesday June 11 2018 18:00:05 EDT Ventricular Rate:  68 PR Interval:  138 QRS Duration: 86 QT Interval:  402 QTC Calculation: 427 R Axis:   37 Text Interpretation:  Normal sinus rhythm Normal ECG since last tracing no significant change Confirmed by Noemi Chapel [phone]) on 06/11/2018 6:06:11 PM   Radiology Dg Chest 2 View  Result Date: 06/11/2018 CLINICAL DATA:  Chest pain  and shortness of breath beginning today. EXAM: CHEST - 2 VIEW COMPARISON:  12/09/2017 FINDINGS: The heart size and mediastinal contours are within normal limits. Calcified granuloma again seen in the left upper lobe. No evidence of pulmonary infiltrate or edema. No evidence of pleural effusion. The visualized skeletal structures are unremarkable. IMPRESSION: No active cardiopulmonary disease. Electronically Signed   By: Earle Gell M.D.   On: 06/11/2018 19:08    Procedures Procedures (including critical care time)  Medications Ordered in ED Medications - No data to display   Initial Impression / Assessment and Plan / ED Course  I have reviewed the triage vital signs and the nursing notes.  Pertinent labs & imaging results that were available during my care of the patient were reviewed by me and considered in my medical decision making (see chart for details).  Clinical Course as of Jun 11 1917  Tue Jun 11, 2018  1912 The chest x-ray is unremarkable with no signs of infiltrate pneumothorax or abnormal mediastinum, lab work shows normal blood counts except for a slight thrombocytopenia at 137,000.  Troponin is normal and metabolic panel is unremarkable except for a glucose of 170.  Recheck blood pressure shows 134/80, normal pulse, normal respirations and unremarkable oxygen saturations.  At this point I think the patient is stable for discharge, he can follow-up in the outpatient setting.  He has expressed his understanding to the indications for return.   [BM]    Clinical Course User Index [BM] Noemi Chapel, MD   The patient had a myocardial stress test performed November 2018, low risk study, the nuclear stress test revealed an ejection fraction of 53% with no evidence of ST segment deviation or obvious ischemia or scarring.  His symptoms today are very atypical, his EKG is unremarkable, will obtain a chest x-ray and some lab work to support this however the patient likely has some issues  with underlying developing high blood pressure.  His pressure here is 134/80, he is essentially asymptomatic so I have a very slight discomfort left of midline in his chest and is very low risk with his heart pathway score.  At this time the patient will need a troponin, chest x-ray, he is agreeable to the plan.  Reassurance given regarding his blood pressure.  Final Clinical  Impressions(s) / ED Diagnoses   Final diagnoses:  Essential hypertension  Chest pain, unspecified type      Noemi Chapel, MD 06/11/18 1918

## 2018-06-11 NOTE — Patient Instructions (Signed)
The instructions on your doxazosin indicate that he should be taking 1 full tablet every day.  Make sure that you are taking 1 full tablet daily for blood pressure control.  Continue to monitor blood pressures.  Your goal is less than 140/90.  If your blood pressures persistently elevated after 1 month on this regimen, I would like you to follow-up, as we will need to add additional medication.  For your dental pain and possible infection, I have added Augmentin for you to take twice daily for the next 10 days.  I have also added Naprosyn free to take up to twice daily if needed for pain and inflammation.  Please make sure that you take the medication with food.  Follow-up with dentistry.  Your laceration does not appear to be infected today.  Continue to keep it clean with mild soap and water.  If you develop any increased redness, tenderness, warmth or fevers, please seek immediate medical attention.   Hypertension Hypertension is another name for high blood pressure. High blood pressure forces your heart to work harder to pump blood. This can cause problems over time. There are two numbers in a blood pressure reading. There is a top number (systolic) over a bottom number (diastolic). It is best to have a blood pressure below 120/80. Healthy choices can help lower your blood pressure. You may need medicine to help lower your blood pressure if:  Your blood pressure cannot be lowered with healthy choices.  Your blood pressure is higher than 130/80.  Follow these instructions at home: Eating and drinking  If directed, follow the DASH eating plan. This diet includes: ? Filling half of your plate at each meal with fruits and vegetables. ? Filling one quarter of your plate at each meal with whole grains. Whole grains include whole wheat pasta, brown rice, and whole grain bread. ? Eating or drinking low-fat dairy products, such as skim milk or low-fat yogurt. ? Filling one quarter of your plate at  each meal with low-fat (lean) proteins. Low-fat proteins include fish, skinless chicken, eggs, beans, and tofu. ? Avoiding fatty meat, cured and processed meat, or chicken with skin. ? Avoiding premade or processed food.  Eat less than 1,500 mg of salt (sodium) a day.  Limit alcohol use to no more than 1 drink a day for nonpregnant women and 2 drinks a day for men. One drink equals 12 oz of beer, 5 oz of wine, or 1 oz of hard liquor. Lifestyle  Work with your doctor to stay at a healthy weight or to lose weight. Ask your doctor what the best weight is for you.  Get at least 30 minutes of exercise that causes your heart to beat faster (aerobic exercise) most days of the week. This may include walking, swimming, or biking.  Get at least 30 minutes of exercise that strengthens your muscles (resistance exercise) at least 3 days a week. This may include lifting weights or pilates.  Do not use any products that contain nicotine or tobacco. This includes cigarettes and e-cigarettes. If you need help quitting, ask your doctor.  Check your blood pressure at home as told by your doctor.  Keep all follow-up visits as told by your doctor. This is important. Medicines  Take over-the-counter and prescription medicines only as told by your doctor. Follow directions carefully.  Do not skip doses of blood pressure medicine. The medicine does not work as well if you skip doses. Skipping doses also puts you at risk  for problems.  Ask your doctor about side effects or reactions to medicines that you should watch for. Contact a doctor if:  You think you are having a reaction to the medicine you are taking.  You have headaches that keep coming back (recurring).  You feel dizzy.  You have swelling in your ankles.  You have trouble with your vision. Get help right away if:  You get a very bad headache.  You start to feel confused.  You feel weak or numb.  You feel faint.  You get very bad  pain in your: ? Chest. ? Belly (abdomen).  You throw up (vomit) more than once.  You have trouble breathing. Summary  Hypertension is another name for high blood pressure.  Making healthy choices can help lower blood pressure. If your blood pressure cannot be controlled with healthy choices, you may need to take medicine. This information is not intended to replace advice given to you by your health care provider. Make sure you discuss any questions you have with your health care provider. Document Released: 02/28/2008 Document Revised: 08/09/2016 Document Reviewed: 08/09/2016 Elsevier Interactive Patient Education  [employer].

## 2018-06-11 NOTE — Telephone Encounter (Signed)
Patient denies chest pain but has some pressure in his chest.  Last BP reading at home was 212/136.  He and his wife decided to go to [facility] before I called back and they are en route now and expect to arrive shortly. She is driving him. Advised to call 911 while driving if he has any worsening symptoms. Patient stated understanding and agreement to plan. He will f/u with our office as needed.

## 2018-06-11 NOTE — Progress Notes (Signed)
Subjective: CC: HTN PCP: [name], DO Thomas Ball is a Thomas Ball presenting to clinic today for:  1. HTN Patient reports that he has been intermittently checking blood pressures at [facility] and they have been running 139-143/90s.  He denies any chest pain.  He reports chronic stable shortness of breath.  No lower extremity edema, visual disturbance.  He is an every day smoker and smokes about 1 pack/day.  He has smoked for over 50 years.  He also intakes quite a bit of salt.  He is planning on working on weight loss with goal to be at 250 pounds by next year.  He has been taking half a tablet of Cardura daily.  2.  Laceration Patient reports he sustained a laceration to the left forearm.  He has been wrapping it with a bandage and Neosporin.  He wanted to have it looked at to make sure it was not infected.  Denies any fevers or purulence.  No substantial tenderness or redness.  3.  Dental pain Patient reports he has a tooth on the upper left side of his mouth that he feels may be infected.  He notes that he has had abscessed tooth in the past and actually seen dentistry for this.  They placed him on an antibiotic and pain medication and he is wondering if he can get this today.  He reports he is supposed to follow-up to have the tooth pulled but does not have $250 to do so.  Again denies any fevers.   ROS: Per HPI  No Known Allergies Past Medical History:  Diagnosis Date  . GERD (gastroesophageal reflux disease)   . HCV (hepatitis C virus) DR. ZACKS   AUG 2013 VIRAL LOAD UNDETECTABLE  . Pancreatitis, gallstone   . Skin cancer 1992   removed from side of head  . SVT (supraventricular tachycardia) (HCC)     Current Outpatient Medications:  .  atorvastatin (LIPITOR) 40 MG tablet, Take 1 tablet (40 mg total) by mouth daily at 6 PM., Disp: 90 tablet, Rfl: 4 .  doxazosin (CARDURA) 1 MG tablet, Take 1 tablet (1 mg total) by mouth daily., Disp: 90 tablet, Rfl: 2 .   omeprazole (PRILOSEC) 20 MG capsule, Take 1 capsule (20 mg total) by mouth daily., Disp: 30 capsule, Rfl: 0 Social History   Socioeconomic History  . Marital status: Married    Spouse name: Not on file  . Number of children: Not on file  . Years of education: Not on file  . Highest education level: Not on file  Occupational History  . Occupation: concrete work    Fish farm manager: SELF-EMPLOYED  Social Needs  . Financial resource strain: Not on file  . Food insecurity:    Worry: Not on file    Inability: Not on file  . Transportation needs:    Medical: Not on file    Non-medical: Not on file  Tobacco Use  . Smoking status: Current Every Day Smoker    Packs/day: 1.50    Years: 35.00    Pack years: 52.50    Types: Cigarettes  . Smokeless tobacco: Never Used  . Tobacco comment: had quit for 9 mos, picked up again 2 mos ago  Substance and Sexual Activity  . Alcohol use: No  . Drug use: No    Comment: in the 1980s, smoke crack. None since Dec 2011.   Marland Kitchen Sexual activity: Not on file  Lifestyle  . Physical activity:  Days per week: Not on file    Minutes per session: Not on file  . Stress: Not on file  Relationships  . Social connections:    Talks on phone: Not on file    Gets together: Not on file    Attends religious service: Not on file    Active member of club or organization: Not on file    Attends meetings of clubs or organizations: Not on file    Relationship status: Not on file  . Intimate partner violence:    Fear of current or ex partner: Not on file    Emotionally abused: Not on file    Physically abused: Not on file    Forced sexual activity: Not on file  Other Topics Concern  . Not on file  Social History Narrative  . Not on file   Family History  Problem Relation Age of Onset  . Emphysema Mother   . COPD Mother   . Early death Mother   . Hyperlipidemia Father   . Stroke Father   . Diabetes Father   . High Cholesterol Father   . Colon cancer Neg Hx      Objective: Office vital signs reviewed. BP (!) 148/89   Pulse 64   Temp 98.7 F (37.1 C) (Oral)   Ht 5\' 5"  (1.651 m)   Wt 297 lb (134.7 kg)   BMI 49.42 kg/m   Physical Examination:  General: Awake, alert, obese, No acute distress, smells of cigarette smoke HEENT: sclera white, MMM, caries noted with several teeth missing and broken teeth.  No palpable fluctuance or expressible exudate from the area of concern.  No facial swelling. Cardio: regular rate and rhythm, S1S2 heard, no murmurs appreciated Pulm: clear to auscultation bilaterally, no wheezes, rhonchi or rales; normal work of breathing on room air Extremities: warm, well perfused, No edema, cyanosis or clubbing; +2 pulses bilaterally Skin: ~1.5 cm healing laceration appreciated along the dorsal aspect of the left forearm.  There is very mild surrounding erythema at the borders but no evidence of infection.  No increased warmth.  No exudate or fluctuance.  No induration.  Assessment/ Plan: Thomas Ball   1. Essential hypertension Patient is afebrile nontoxic-appearing.  His blood pressure is elevated.  We discussed that his goal is below 140 over 90s.  I did instruct him to increase the Cardura to 1 mg daily as directed.  May need to add additional agent, as this medication was primarily started for BPH symptoms.  I recommended that he reduce salt and work on weight loss.  Tobacco cessation also recommended during today's visit.  He will follow-up in 1 month if blood pressures are persistently elevated.  2. Pain, dental No gross evidence of infection but will empirically treat with Augmentin and Naprosyn.  He is to follow-up with his dentist.  3. Laceration of left forearm, initial encounter No evidence of infection.  Seems to be healing well.  Continue to keep clean and monitor for signs and symptoms of infection.  Would benefit from Tetanus update.  Meds ordered this encounter  Medications  . amoxicillin-clavulanate  (AUGMENTIN) 875-125 MG tablet    Sig: Take 1 tablet by mouth 2 (two) times daily.    Dispense:  20 tablet    Refill:  0  . naproxen (NAPROSYN) 500 MG tablet    Sig: Take 1 tablet (500 mg total) by mouth 2 (two) times daily as needed for moderate pain (dental).    Dispense:  30 tablet    Refill:  [name], DO [place] [phone]

## 2018-06-11 NOTE — Discharge Instructions (Signed)
Please start taking a baby aspirin daily, continue taking her blood pressure medications, follow-up with your doctor for a repeat blood pressure check within 2 weeks.  Please do not use your home blood pressure cuff as this seems to be extremely inaccurate.  He will also need to recheck with the cardiologist if you continue to have chest pain.  If your symptoms worsen with increasing pain, difficulty breathing, weakness or nausea return to the emergency department immediately.  Your testing today has not shown any specific abnormalities other than a slight elevation in blood sugar.

## 2018-06-11 NOTE — ED Triage Notes (Signed)
Pt c/o of elevated BP. Pt states his BP 212/136 and now c/o of central chest pain.

## 2018-06-11 NOTE — ED Notes (Signed)
Patient transported to X-ray 

## 2018-06-14 ENCOUNTER — Ambulatory Visit: Payer: Self-pay | Admitting: Pulmonary Disease

## 2018-06-20 ENCOUNTER — Ambulatory Visit: Payer: Self-pay | Admitting: Gastroenterology

## 2018-06-24 ENCOUNTER — Other Ambulatory Visit: Payer: Self-pay | Admitting: Family Medicine

## 2018-06-25 ENCOUNTER — Telehealth: Payer: Self-pay | Admitting: Gastroenterology

## 2018-06-25 NOTE — Telephone Encounter (Signed)
Forwarding to Dr. Fields to advise! 

## 2018-06-25 NOTE — Telephone Encounter (Signed)
Pt called to reschedule his OV with SF from tomorrow to Dec 12. He is asking about having his labs done prior to his appointment so SF will have the results at his OV. Please advise if he is due labs and call him at [phone]

## 2018-06-25 NOTE — Telephone Encounter (Signed)
PLEASE CALL PT. WE CANNOT ORDER HIS LABS PRIOR TO HIS OPV BECAUSE HE HAS NOT BEEN SEEN SINCE APR 2018.

## 2018-06-25 NOTE — Telephone Encounter (Signed)
LMOM to call.

## 2018-06-26 ENCOUNTER — Ambulatory Visit: Payer: Self-pay | Admitting: Gastroenterology

## 2018-06-26 NOTE — Telephone Encounter (Signed)
Pt is aware.  

## 2018-07-15 ENCOUNTER — Ambulatory Visit: Payer: Self-pay | Admitting: Pulmonary Disease

## 2018-07-16 ENCOUNTER — Ambulatory Visit: Payer: Self-pay | Admitting: Family Medicine

## 2018-07-17 ENCOUNTER — Encounter: Payer: Self-pay | Admitting: Family Medicine

## 2018-07-26 ENCOUNTER — Ambulatory Visit (INDEPENDENT_AMBULATORY_CARE_PROVIDER_SITE_OTHER): Payer: Self-pay | Admitting: Pediatrics

## 2018-07-26 ENCOUNTER — Encounter: Payer: Self-pay | Admitting: Pediatrics

## 2018-07-26 VITALS — BP 112/75 | HR 65 | Temp 96.4°F | Ht 65.0 in | Wt 296.0 lb

## 2018-07-26 DIAGNOSIS — Z6841 Body Mass Index (BMI) 40.0 and over, adult: Secondary | ICD-10-CM

## 2018-07-26 DIAGNOSIS — H65112 Acute and subacute allergic otitis media (mucoid) (sanguinous) (serous), left ear: Secondary | ICD-10-CM

## 2018-07-26 DIAGNOSIS — R062 Wheezing: Secondary | ICD-10-CM

## 2018-07-26 MED ORDER — AZITHROMYCIN 250 MG PO TABS: ORAL_TABLET | ORAL | 0 refills | Status: DC

## 2018-07-26 MED ORDER — BUDESONIDE-FORMOTEROL FUMARATE 160-4.5 MCG/ACT IN AERO: 2.0000 | INHALATION_SPRAY | Freq: Two times a day (BID) | RESPIRATORY_TRACT | 3 refills | Status: DC

## 2018-07-26 MED ORDER — ALBUTEROL SULFATE (2.5 MG/3ML) 0.083% IN NEBU: 2.5000 mg | INHALATION_SOLUTION | Freq: Four times a day (QID) | RESPIRATORY_TRACT | 0 refills | Status: DC | PRN

## 2018-07-26 NOTE — Progress Notes (Signed)
  Subjective:   Patient ID: Thomas Ball, male    DOB: [birth year], 57 y.o.   MRN: [id number] CC: Shortness of Breath  HPI: Thomas Ball is a 57 y.o. male   Started getting congestion, sinus drainage 2 to 3 days ago.  No fevers.  Appetite is been fine.  He has been short of breath off and on for the last few months.  He was on Symbicort in the past, he thinks it helped some.  He has been using some albuterol at home, also helps with his symptoms some.  Minimal cough now, nonproductive.  He has sleep apnea, has a CPAP machine that he is about to pick up and start using.  He asks how much his weight is contributing to current symptoms should trouble with breath.  He does eat large number of carbs regularly now, primarily eats at night for dinner.  He is a daily smoker, 1 to 2 packs.  Open to cutting back and quitting, he has done it before for up to a year. Wife is also smoking at home  Relevant past medical, surgical, family and social history reviewed. Allergies and medications reviewed and updated. Social History   Tobacco Use  Smoking Status Current Every Day Smoker  . Packs/day: 1.50  . Years: 35.00  . Pack years: 52.50  . Types: Cigarettes  Smokeless Tobacco Never Used  Tobacco Comment   had quit for 9 mos, picked up again 2 mos ago   ROS: Per HPI   Objective:    BP 112/75   Pulse 65   Temp (!) 96.4 F (35.8 C) (Oral)   Ht 5\' 5"  (1.651 m)   Wt 296 lb (134.3 kg)   SpO2 94%   BMI 49.26 kg/m   Wt Readings from Last 3 Encounters:  07/26/18 296 lb (134.3 kg)  06/11/18 297 lb (134.7 kg)  06/11/18 297 lb (134.7 kg)    Gen: NAD, alert, cooperative with exam, NCAT EYES: EOMI, no conjunctival injection, or no icterus ENT: Left TM red, layering effusion, OP crowded LYMPH: no cervical LAD CV: NRRR, normal S1/S2, no murmur, distal pulses 2+ b/l Resp: CTABL, no wheezes, normal WOB Abd: +BS, soft, NTND.  Ext: No edema, warm Neuro: Alert and oriented MSK: normal muscle  bulk  Assessment & Plan:  Thomas Ball was seen today for shortness of breath.  Diagnoses and all orders for this visit:  Acute mucoid otitis media of left ear Start below if any worsening in symptoms. -     azithromycin (ZITHROMAX) 250 MG tablet; Take 2 the first day and then one each day after.  Wheezing Restart Symbicort.  Use albuterol as needed.  Start CPAP when able.  Follow-up with PCP 1 to 2 months. -     albuterol (PROVENTIL) (2.5 MG/3ML) 0.083% nebulizer solution; Take 3 mLs (2.5 mg total) by nebulization every 6 (six) hours as needed for wheezing or shortness of breath.  BMI 45.0-49.9, adult [facility])   Discussed benefits of weight loss, some strategies.  Cutting back on carbohydrates.  Increasing vegetable intake, discussed plate should be one quarter meat, size of the pulmonary hand, one quarter carbohydrates, the rest fruits and vegetables.  He does have prediabetes, last A1c 5.9 in 08/2017.  Tobacco use Discussed smoking cessation strategies.  Follow up plan: Return in about 2 months (around 09/25/2018) for with PCP. Assunta Found, MD [place]

## 2018-08-02 ENCOUNTER — Ambulatory Visit: Payer: Self-pay | Admitting: Family Medicine

## 2018-09-02 ENCOUNTER — Ambulatory Visit: Payer: Self-pay | Admitting: Pulmonary Disease

## 2018-09-03 ENCOUNTER — Ambulatory Visit: Payer: Self-pay | Admitting: Family Medicine

## 2018-09-04 ENCOUNTER — Ambulatory Visit: Payer: Self-pay | Admitting: Family Medicine

## 2018-09-05 ENCOUNTER — Ambulatory Visit: Payer: Self-pay | Admitting: Gastroenterology

## 2018-09-05 ENCOUNTER — Ambulatory Visit: Payer: Self-pay | Admitting: Family Medicine

## 2018-09-05 ENCOUNTER — Encounter: Payer: Self-pay | Admitting: Gastroenterology

## 2018-09-05 ENCOUNTER — Telehealth: Payer: Self-pay | Admitting: Gastroenterology

## 2018-09-05 NOTE — Telephone Encounter (Signed)
PATIENT WAS A NO SHOW AND LETTER SENT  °

## 2018-09-05 NOTE — Progress Notes (Deleted)
   Subjective:    Patient ID: Thomas Ball, male    DOB: 1961/05/04, 57 y.o.   MRN: [id number]  HPI    Review of Systems     Objective:   Physical Exam        Assessment & Plan:

## 2018-09-05 NOTE — Telephone Encounter (Signed)
REVIEWED-NO ADDITIONAL RECOMMENDATIONS. 

## 2018-09-09 ENCOUNTER — Ambulatory Visit (INDEPENDENT_AMBULATORY_CARE_PROVIDER_SITE_OTHER): Payer: Self-pay | Admitting: Family Medicine

## 2018-09-09 ENCOUNTER — Encounter: Payer: Self-pay | Admitting: Family Medicine

## 2018-09-09 VITALS — BP 118/72 | HR 97 | Temp 97.0°F | Ht 65.0 in | Wt 301.0 lb

## 2018-09-09 DIAGNOSIS — I10 Essential (primary) hypertension: Secondary | ICD-10-CM

## 2018-09-09 DIAGNOSIS — Z23 Encounter for immunization: Secondary | ICD-10-CM

## 2018-09-09 DIAGNOSIS — Z720 Tobacco use: Secondary | ICD-10-CM

## 2018-09-09 DIAGNOSIS — R351 Nocturia: Secondary | ICD-10-CM

## 2018-09-09 DIAGNOSIS — N401 Enlarged prostate with lower urinary tract symptoms: Secondary | ICD-10-CM

## 2018-09-09 DIAGNOSIS — K7469 Other cirrhosis of liver: Secondary | ICD-10-CM

## 2018-09-09 DIAGNOSIS — K219 Gastro-esophageal reflux disease without esophagitis: Secondary | ICD-10-CM

## 2018-09-09 LAB — BAYER DCA HB A1C WAIVED: HB A1C (BAYER DCA - WAIVED): 6.4 % (ref <7.0)

## 2018-09-09 MED ORDER — ATORVASTATIN CALCIUM 40 MG PO TABS: 40.0000 mg | ORAL_TABLET | Freq: Every day | ORAL | 3 refills | Status: DC

## 2018-09-09 MED ORDER — OMEPRAZOLE 20 MG PO CPDR: DELAYED_RELEASE_CAPSULE | ORAL | 1 refills | Status: DC

## 2018-09-09 MED ORDER — DOXAZOSIN MESYLATE 1 MG PO TABS: 1.0000 mg | ORAL_TABLET | Freq: Every day | ORAL | 2 refills | Status: DC

## 2018-09-09 NOTE — Progress Notes (Signed)
Subjective: CC: HTN, HLD, BPH PCP: [name], DO OQH:[id number] Thomas Ball is a 57 y.o. male presenting to clinic today for:  1. HTN/ HLD/obesity/tobacco use disorder Patient reports compliance with atorvastatin and doxazosin.  No chest pain.  He does occasionally have shortness of breath but states that this is baseline for him.  He continues to smoke about a pack per day.  No hemoptysis or unplanned weight loss.  He knows that he needs to lose weight and often mentions this during exam.  He has not started his CPAP because he is still working on getting the machine.  He notes that various other financial obligations have prevented him from obtaining this.  He would like to have labs performed today.  2.  BPH Patient reports compliance with Cardura as above.  No hematuria.  3.  GERD Patient reports that omeprazole controls GERD.  No nausea, vomiting.   ROS: Per HPI  No Known Allergies Past Medical History:  Diagnosis Date  . GERD (gastroesophageal reflux disease)   . HCV (hepatitis C virus) DR. ZACKS   AUG 2013 VIRAL LOAD UNDETECTABLE  . Hypercholesteremia   . Hypertension   . Pancreatitis, gallstone   . Skin cancer 1992   removed from side of head  . SVT (supraventricular tachycardia) (HCC)     Current Outpatient Medications:  .  albuterol (PROVENTIL) (2.5 MG/3ML) 0.083% nebulizer solution, Take 3 mLs (2.5 mg total) by nebulization every 6 (six) hours as needed for wheezing or shortness of breath., Disp: 150 mL, Rfl: 0 .  atorvastatin (LIPITOR) 40 MG tablet, Take 1 tablet (40 mg total) by mouth daily at 6 PM., Disp: 90 tablet, Rfl: 4 .  budesonide-formoterol (SYMBICORT) 160-4.5 MCG/ACT inhaler, Inhale 2 puffs into the lungs 2 (two) times daily., Disp: 1 Inhaler, Rfl: 3 .  doxazosin (CARDURA) 1 MG tablet, Take 1 tablet (1 mg total) by mouth daily., Disp: 90 tablet, Rfl: 2 .  omeprazole (PRILOSEC) 20 MG capsule, TAKE ONE (1) CAPSULE EACH DAY, Disp: 30 capsule, Rfl:  5 Social History   Socioeconomic History  . Marital status: Married    Spouse name: Not on file  . Number of children: Not on file  . Years of education: Not on file  . Highest education level: Not on file  Occupational History  . Occupation: concrete work    Fish farm manager: SELF-EMPLOYED  Social Needs  . Financial resource strain: Not on file  . Food insecurity:    Worry: Not on file    Inability: Not on file  . Transportation needs:    Medical: Not on file    Non-medical: Not on file  Tobacco Use  . Smoking status: Current Every Day Smoker    Packs/day: 1.50    Years: 35.00    Pack years: 52.50    Types: Cigarettes  . Smokeless tobacco: Never Used  . Tobacco comment: had quit for 9 mos, picked up again 2 mos ago  Substance and Sexual Activity  . Alcohol use: No  . Drug use: No    Comment: in the 1980s, smoke crack. None since Dec 2011.   Marland Kitchen Sexual activity: Not on file  Lifestyle  . Physical activity:    Days per week: Not on file    Minutes per session: Not on file  . Stress: Not on file  Relationships  . Social connections:    Talks on phone: Not on file    Gets together: Not on file  Attends religious service: Not on file    Active member of club or organization: Not on file    Attends meetings of clubs or organizations: Not on file    Relationship status: Not on file  . Intimate partner violence:    Fear of current or ex partner: Not on file    Emotionally abused: Not on file    Physically abused: Not on file    Forced sexual activity: Not on file  Other Topics Concern  . Not on file  Social History Narrative  . Not on file   Family History  Problem Relation Age of Onset  . Emphysema Mother   . COPD Mother   . Early death Mother   . Hyperlipidemia Father   . Stroke Father   . Diabetes Father   . High Cholesterol Father   . Colon cancer Neg Hx     Objective: Office vital signs reviewed. BP 118/72   Pulse 97   Temp (!) 97 F (36.1 C) (Oral)    Ht '5\' 5"'  (1.651 m)   Wt (!) 301 lb (136.5 kg)   BMI 50.09 kg/m   Physical Examination:  General: Awake, alert, morbidly obese, No acute distress HEENT: Normal    Eyes: PERRLA, extraocular membranes intact, sclera white    Throat: moist mucus membranes, no erythema Cardio: regular rate and rhythm, S1S2 heard, no murmurs appreciated Pulm: clear to auscultation bilaterally, no wheezes, rhonchi or rales; normal work of breathing on room air Extremities: warm, well perfused, No edema, cyanosis or clubbing; +2 pulses bilaterally  Assessment/ Plan: 57 y.o. male   1. Essential hypertension Controlled.  No changes made.  Patient has refills of doxazosin, which is also used for BPH as below - CMP14+EGFR  2. Gastroesophageal reflux disease without esophagitis Omeprazole refilled  3. Tobacco abuse Currently actively reducing smoking.  4. Other cirrhosis of liver (HCC) History of hepatitis C.  However the cirrhosis thought to be related to fatty liver.  Check LFTs. - CMP14+EGFR - Lipid Panel  5. Morbid obesity ([place]) Multifactorial.  He is working on getting his CPAP machine. - CMP14+EGFR - TSH - Bayer DCA Hb A1c Waived - Lipid Panel  6. Benign prostatic hyperplasia with nocturia Stable - PSA   Orders Placed This Encounter  Procedures  . CMP14+EGFR  . TSH  . Bayer DCA Hb A1c Waived  . Lipid Panel  . PSA   Meds ordered this encounter  Medications  . omeprazole (PRILOSEC) 20 MG capsule    Sig: TAKE ONE (1) CAPSULE EACH DAY    Dispense:  90 capsule    Refill:  1  . doxazosin (CARDURA) 1 MG tablet    Sig: Take 1 tablet (1 mg total) by mouth daily.    Dispense:  90 tablet    Refill:  2  . atorvastatin (LIPITOR) 40 MG tablet    Sig: Take 1 tablet (40 mg total) by mouth daily at 6 PM.    Dispense:  90 tablet    Refill:  O[place], DO [place] [phone]

## 2018-09-09 NOTE — Patient Instructions (Signed)
You had labs performed today.  You will be contacted with the results of the labs once they are available, usually in the next 3 business days for routine lab work.   See me in 3 months for follow up on smoking and blood pressure.

## 2018-09-10 ENCOUNTER — Encounter: Payer: Self-pay | Admitting: Family Medicine

## 2018-09-10 LAB — CMP14+EGFR
ALBUMIN: 4.2 g/dL (ref 3.5–5.5)
ALT: 23 IU/L (ref 0–44)
AST: 16 IU/L (ref 0–40)
Albumin/Globulin Ratio: 1.6 (ref 1.2–2.2)
Alkaline Phosphatase: 33 IU/L — ABNORMAL LOW (ref 39–117)
BUN / CREAT RATIO: 15 (ref 9–20)
BUN: 15 mg/dL (ref 6–24)
Bilirubin Total: 0.7 mg/dL (ref 0.0–1.2)
CO2: 24 mmol/L (ref 20–29)
CREATININE: 0.97 mg/dL (ref 0.76–1.27)
Calcium: 9.5 mg/dL (ref 8.7–10.2)
Chloride: 96 mmol/L (ref 96–106)
GFR, EST AFRICAN AMERICAN: 101 mL/min/{1.73_m2} (ref >59)
GFR, EST NON AFRICAN AMERICAN: 88 mL/min/{1.73_m2} (ref >59)
GLUCOSE: 122 mg/dL — AB (ref 65–99)
Globulin, Total: 2.6 g/dL (ref 1.5–4.5)
Potassium: 4.9 mmol/L (ref 3.5–5.2)
Sodium: 138 mmol/L (ref 134–144)
TOTAL PROTEIN: 6.8 g/dL (ref 6.0–8.5)

## 2018-09-10 LAB — LIPID PANEL
CHOL/HDL RATIO: 3.1 ratio (ref 0.0–5.0)
Cholesterol, Total: 116 mg/dL (ref 100–199)
HDL: 38 mg/dL — AB (ref >39)
LDL CALC: 52 mg/dL (ref 0–99)
TRIGLYCERIDES: 132 mg/dL (ref 0–149)
VLDL CHOLESTEROL CAL: 26 mg/dL (ref 5–40)

## 2018-09-10 LAB — TSH: TSH: 1.42 u[IU]/mL (ref 0.450–4.500)

## 2018-09-10 LAB — PSA: Prostate Specific Ag, Serum: 1.3 ng/mL (ref 0.0–4.0)

## 2018-09-15 ENCOUNTER — Observation Stay (HOSPITAL_COMMUNITY)
Admission: EM | Admit: 2018-09-15 | Discharge: 2018-09-17 | Disposition: A | Discharge status: Home / Self Care | Payer: Self-pay | Attending: Internal Medicine | Admitting: Internal Medicine

## 2018-09-15 ENCOUNTER — Encounter (HOSPITAL_COMMUNITY): Payer: Self-pay | Admitting: Emergency Medicine

## 2018-09-15 ENCOUNTER — Encounter (HOSPITAL_COMMUNITY): Payer: Self-pay | Admitting: *Deleted

## 2018-09-15 ENCOUNTER — Emergency Department (HOSPITAL_COMMUNITY)
Admission: EM | Admit: 2018-09-15 | Discharge: 2018-09-15 | Disposition: A | Discharge status: Home / Self Care | Payer: Self-pay | Attending: Emergency Medicine | Admitting: Emergency Medicine

## 2018-09-15 ENCOUNTER — Other Ambulatory Visit: Payer: Self-pay

## 2018-09-15 ENCOUNTER — Emergency Department (HOSPITAL_COMMUNITY): Payer: Self-pay

## 2018-09-15 DIAGNOSIS — Z85828 Personal history of other malignant neoplasm of skin: Secondary | ICD-10-CM | POA: Insufficient documentation

## 2018-09-15 DIAGNOSIS — F17210 Nicotine dependence, cigarettes, uncomplicated: Secondary | ICD-10-CM | POA: Insufficient documentation

## 2018-09-15 DIAGNOSIS — R072 Precordial pain: Secondary | ICD-10-CM | POA: Insufficient documentation

## 2018-09-15 DIAGNOSIS — Z720 Tobacco use: Secondary | ICD-10-CM | POA: Diagnosis present

## 2018-09-15 DIAGNOSIS — I110 Hypertensive heart disease with heart failure: Secondary | ICD-10-CM | POA: Insufficient documentation

## 2018-09-15 DIAGNOSIS — I509 Heart failure, unspecified: Secondary | ICD-10-CM | POA: Insufficient documentation

## 2018-09-15 DIAGNOSIS — R0600 Dyspnea, unspecified: Secondary | ICD-10-CM

## 2018-09-15 DIAGNOSIS — E1159 Type 2 diabetes mellitus with other circulatory complications: Secondary | ICD-10-CM | POA: Diagnosis present

## 2018-09-15 DIAGNOSIS — G4733 Obstructive sleep apnea (adult) (pediatric): Secondary | ICD-10-CM | POA: Diagnosis present

## 2018-09-15 DIAGNOSIS — I471 Supraventricular tachycardia: Secondary | ICD-10-CM | POA: Insufficient documentation

## 2018-09-15 DIAGNOSIS — K7460 Unspecified cirrhosis of liver: Secondary | ICD-10-CM | POA: Diagnosis present

## 2018-09-15 DIAGNOSIS — I10 Essential (primary) hypertension: Secondary | ICD-10-CM | POA: Insufficient documentation

## 2018-09-15 DIAGNOSIS — Z79899 Other long term (current) drug therapy: Secondary | ICD-10-CM | POA: Insufficient documentation

## 2018-09-15 DIAGNOSIS — R0609 Other forms of dyspnea: Secondary | ICD-10-CM | POA: Insufficient documentation

## 2018-09-15 DIAGNOSIS — I152 Hypertension secondary to endocrine disorders: Secondary | ICD-10-CM | POA: Diagnosis present

## 2018-09-15 DIAGNOSIS — E669 Obesity, unspecified: Secondary | ICD-10-CM | POA: Insufficient documentation

## 2018-09-15 DIAGNOSIS — R0602 Shortness of breath: Principal | ICD-10-CM | POA: Diagnosis present

## 2018-09-15 DIAGNOSIS — R079 Chest pain, unspecified: Secondary | ICD-10-CM | POA: Diagnosis present

## 2018-09-15 LAB — CBC WITH DIFFERENTIAL/PLATELET
ABS IMMATURE GRANULOCYTES: 0.01 10*3/uL (ref 0.00–0.07)
Abs Immature Granulocytes: 0.01 10*3/uL (ref 0.00–0.07)
BASOS ABS: 0 10*3/uL (ref 0.0–0.1)
Basophils Absolute: 0 10*3/uL (ref 0.0–0.1)
Basophils Relative: 1 %
Basophils Relative: 1 %
EOS PCT: 2 %
EOS PCT: 2 %
Eosinophils Absolute: 0.1 10*3/uL (ref 0.0–0.5)
Eosinophils Absolute: 0.1 10*3/uL (ref 0.0–0.5)
HCT: 42 % (ref 39.0–52.0)
HEMATOCRIT: 42.9 % (ref 39.0–52.0)
HEMOGLOBIN: 14.1 g/dL (ref 13.0–17.0)
HEMOGLOBIN: 13.9 g/dL (ref 13.0–17.0)
Immature Granulocytes: 0 %
Immature Granulocytes: 0 %
LYMPHS PCT: 19 %
LYMPHS PCT: 20 %
Lymphs Abs: 1.2 10*3/uL (ref 0.7–4.0)
Lymphs Abs: 1.4 10*3/uL (ref 0.7–4.0)
MCH: 32.3 pg (ref 26.0–34.0)
MCH: 31.1 pg (ref 26.0–34.0)
MCHC: 33.6 g/dL (ref 30.0–36.0)
MCHC: 32.4 g/dL (ref 30.0–36.0)
MCV: 96.1 fL (ref 80.0–100.0)
MCV: 96 fL (ref 80.0–100.0)
MONO ABS: 0.5 10*3/uL (ref 0.1–1.0)
Monocytes Absolute: 0.5 10*3/uL (ref 0.1–1.0)
Monocytes Relative: 8 %
Monocytes Relative: 8 %
NEUTROS ABS: 4.7 10*3/uL (ref 1.7–7.7)
NRBC: 0 % (ref 0.0–0.2)
Neutro Abs: 4.3 10*3/uL (ref 1.7–7.7)
Neutrophils Relative %: 70 %
Neutrophils Relative %: 69 %
Platelets: 162 10*3/uL (ref 150–400)
Platelets: 150 10*3/uL (ref 150–400)
RBC: 4.37 MIL/uL (ref 4.22–5.81)
RBC: 4.47 MIL/uL (ref 4.22–5.81)
RDW: 13.2 % (ref 11.5–15.5)
RDW: 13.1 % (ref 11.5–15.5)
WBC: 6 10*3/uL (ref 4.0–10.5)
WBC: 6.8 10*3/uL (ref 4.0–10.5)
nRBC: 0 % (ref 0.0–0.2)

## 2018-09-15 LAB — COMPREHENSIVE METABOLIC PANEL
ALT: 24 U/L (ref 0–44)
ANION GAP: 4 — AB (ref 5–15)
AST: 17 U/L (ref 15–41)
Albumin: 3.9 g/dL (ref 3.5–5.0)
Alkaline Phosphatase: 24 U/L — ABNORMAL LOW (ref 38–126)
BUN: 16 mg/dL (ref 6–20)
CO2: 26 mmol/L (ref 22–32)
Calcium: 8.8 mg/dL — ABNORMAL LOW (ref 8.9–10.3)
Chloride: 105 mmol/L (ref 98–111)
Creatinine, Ser: 0.73 mg/dL (ref 0.61–1.24)
GFR calc Af Amer: >60 mL/min (ref >60)
GFR calc non Af Amer: >60 mL/min (ref >60)
Glucose, Bld: 131 mg/dL — ABNORMAL HIGH (ref 70–99)
Potassium: 4.1 mmol/L (ref 3.5–5.1)
SODIUM: 135 mmol/L (ref 135–145)
Total Bilirubin: 1.2 mg/dL (ref 0.3–1.2)
Total Protein: 6.8 g/dL (ref 6.5–8.1)

## 2018-09-15 LAB — TROPONIN I
Troponin I: <0.03 ng/mL (ref <0.03)
Troponin I: <0.03 ng/mL (ref <0.03)

## 2018-09-15 LAB — BASIC METABOLIC PANEL
Anion gap: 5 (ref 5–15)
BUN: 15 mg/dL (ref 6–20)
CO2: 25 mmol/L (ref 22–32)
CREATININE: 0.72 mg/dL (ref 0.61–1.24)
Calcium: 8.5 mg/dL — ABNORMAL LOW (ref 8.9–10.3)
Chloride: 104 mmol/L (ref 98–111)
GFR calc Af Amer: >60 mL/min (ref >60)
Glucose, Bld: 156 mg/dL — ABNORMAL HIGH (ref 70–99)
Potassium: 4.1 mmol/L (ref 3.5–5.1)
SODIUM: 134 mmol/L — AB (ref 135–145)

## 2018-09-15 LAB — D-DIMER, QUANTITATIVE: D-Dimer, Quant: 0.42 ug/mL-FEU (ref 0.00–0.50)

## 2018-09-15 LAB — BRAIN NATRIURETIC PEPTIDE: B Natriuretic Peptide: 200 pg/mL — ABNORMAL HIGH (ref 0.0–100.0)

## 2018-09-15 MED ORDER — ACETAMINOPHEN 325 MG PO TABS: 650.0000 mg | ORAL_TABLET | ORAL | Status: DC | PRN

## 2018-09-15 MED ORDER — METHYLPREDNISOLONE SODIUM SUCC 125 MG IJ SOLR
125.0000 mg | Freq: Once | INTRAMUSCULAR | Status: AC
  Administered 2018-09-15: 125 mg via INTRAVENOUS
  Filled 2018-09-15: qty 2

## 2018-09-15 MED ORDER — IPRATROPIUM-ALBUTEROL 0.5-2.5 (3) MG/3ML IN SOLN
3.0000 mL | Freq: Once | RESPIRATORY_TRACT | Status: AC
  Administered 2018-09-15: 3 mL via RESPIRATORY_TRACT

## 2018-09-15 MED ORDER — FUROSEMIDE 10 MG/ML IJ SOLN
40.0000 mg | Freq: Once | INTRAMUSCULAR | Status: AC
  Administered 2018-09-15: 40 mg via INTRAVENOUS
  Filled 2018-09-15: qty 4

## 2018-09-15 MED ORDER — DOXAZOSIN MESYLATE 2 MG PO TABS
1.0000 mg | ORAL_TABLET | Freq: Every day | ORAL | Status: DC
  Administered 2018-09-16 – 2018-09-17 (×2): 1 mg via ORAL
  Filled 2018-09-15 (×3): qty 1

## 2018-09-15 MED ORDER — ASPIRIN EC 325 MG PO TBEC
325.0000 mg | DELAYED_RELEASE_TABLET | Freq: Every day | ORAL | Status: DC
  Administered 2018-09-15 – 2018-09-17 (×3): 325 mg via ORAL
  Filled 2018-09-15 (×3): qty 1

## 2018-09-15 MED ORDER — IPRATROPIUM-ALBUTEROL 0.5-2.5 (3) MG/3ML IN SOLN
3.0000 mL | Freq: Once | RESPIRATORY_TRACT | Status: AC
  Administered 2018-09-15: 3 mL via RESPIRATORY_TRACT
  Filled 2018-09-15: qty 3

## 2018-09-15 MED ORDER — ALBUTEROL SULFATE (2.5 MG/3ML) 0.083% IN NEBU: 2.5000 mg | INHALATION_SOLUTION | Freq: Four times a day (QID) | RESPIRATORY_TRACT | Status: DC | PRN

## 2018-09-15 MED ORDER — ONDANSETRON HCL 4 MG/2ML IJ SOLN: 4.0000 mg | Freq: Four times a day (QID) | INTRAMUSCULAR | Status: DC | PRN

## 2018-09-15 MED ORDER — ASPIRIN 81 MG PO CHEW
324.0000 mg | CHEWABLE_TABLET | Freq: Once | ORAL | Status: AC
  Administered 2018-09-15: 324 mg via ORAL
  Filled 2018-09-15: qty 4

## 2018-09-15 MED ORDER — ATORVASTATIN CALCIUM 40 MG PO TABS
40.0000 mg | ORAL_TABLET | Freq: Every day | ORAL | Status: DC
  Administered 2018-09-16: 40 mg via ORAL
  Filled 2018-09-15: qty 1

## 2018-09-15 NOTE — ED Provider Notes (Signed)
[facility] EMERGENCY DEPARTMENT Provider Note   CSN: [id number] Arrival date & time: 09/15/18  1737     History   Chief Complaint Chief Complaint  Patient presents with  . Shortness of Breath    HPI Thomas Ball is a 57 y.o. male.  Patient seen earlier today for chest pain and dyspnea.  Previous emergency physician recommended admission after discussion with cardiologist.  Patient left the ED to take care of some personal business.  He now returns with similar symptoms.  He is a longtime smoker but is reduced from 2 packs/day to 1 pack/day.  He is overweight.  Past medical history includes SVT, hypertension, hypercholesterolemia, obesity, hepatitis C virus (in remission)     Past Medical History:  Diagnosis Date  . GERD (gastroesophageal reflux disease)   . HCV (hepatitis C virus) DR. ZACKS   AUG 2013 VIRAL LOAD UNDETECTABLE  . Hypercholesteremia   . Hypertension   . Pancreatitis, gallstone   . Skin cancer 1992   removed from side of head  . SVT (supraventricular tachycardia) [facility])     Patient Active Problem List   Diagnosis Date Noted  . Essential hypertension 04/26/2018  . OSA (obstructive sleep apnea) 03/04/2018  . BPH (benign prostatic hyperplasia) 10/07/2015  . EIC (epidermal inclusion cyst) 07/23/2015  . Tobacco abuse 07/02/2015  . SVT (supraventricular tachycardia) ([place])   . Intermittent palpitations 04/21/2014  . Bradycardia 04/21/2014  . Morbid obesity ([place]) 04/21/2014  . Chest pain 04/20/2014  . Cirrhosis of liver ([place]) 11/05/2013  . GERD (gastroesophageal reflux disease) 07/18/2012  . Screening for colorectal cancer 07/18/2012  . Pulmonary nodule 02/14/2012    Past Surgical History:  Procedure Laterality Date  . 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 CHOLECYSTECTOMY    . COLONOSCOPY  10/04/2012   SLF:ONE/8 SIMPLE ADENOMA, TICS, SML IH  . ESOPHAGOGASTRODUODENOSCOPY N/A 09/04/2014   Procedure: ESOPHAGOGASTRODUODENOSCOPY (EGD);   Surgeon: Danie Binder, MD;  [facility];  Service: Endoscopy;  Laterality: N/A;  145  . GALLBLADDER SURGERY    . JAW BROKEN    . LIPOMA REMOVALS     SEVERAL  . SUPRAVENTRICULAR TACHYCARDIA ABLATION N/A 06/22/2014   Procedure: SUPRAVENTRICULAR TACHYCARDIA ABLATION;  Surgeon: Evans Lance, MD;  Location: [facility] CATH LAB;  Service: Cardiovascular;  Laterality: N/A;        Home Medications    Prior to Admission medications   Medication Sig Start Date End Date Taking? Authorizing Provider  albuterol (PROVENTIL) (2.5 MG/3ML) 0.083% nebulizer solution Take 3 mLs (2.5 mg total) by nebulization every 6 (six) hours as needed for wheezing or shortness of breath. 07/26/18  Yes Eustaquio Maize, MD  atorvastatin (LIPITOR) 40 MG tablet Take 1 tablet (40 mg total) by mouth daily at 6 PM. Patient taking differently: Take 40 mg by mouth daily.  09/09/18  Yes Gottschalk, Leatrice Jewels M, DO  budesonide-formoterol (SYMBICORT) 160-4.5 MCG/ACT inhaler Inhale 2 puffs into the lungs 2 (two) times daily. 07/26/18  Yes Eustaquio Maize, MD  doxazosin (CARDURA) 1 MG tablet Take 1 tablet (1 mg total) by mouth daily. 09/09/18  Yes Gottschalk, Ashly M, DO  omeprazole (PRILOSEC) 20 MG capsule TAKE ONE (1) [place] Patient taking differently: Take 20 mg by mouth daily.  09/09/18  Yes [name], DO    Family History Family History  Problem Relation Age of Onset  . Emphysema Mother   . COPD Mother   . Early death  Mother   . Hyperlipidemia Father   . Stroke Father   . Diabetes Father   . High Cholesterol Father   . Colon cancer Neg Hx     Social History Social History   Tobacco Use  . Smoking status: Current Every Day Smoker    Packs/day: 1.50    Years: 35.00    Pack years: 52.50    Types: Cigarettes  . Smokeless tobacco: Never Used  Substance Use Topics  . Alcohol use: No  . Drug use: No    Comment: in the 1980s, smoke crack. None since Dec 2011.      Allergies   Patient has  no known allergies.   Review of Systems Review of Systems  All other systems reviewed and are negative.    Physical Exam Updated Vital Signs BP 128/65 (BP Location: Right Arm)   Pulse 65   Temp 98 F (36.7 C) (Oral)   Resp 20   Ht 5\' 5"  (1.651 m)   Wt 133.8 kg   SpO2 97%   BMI 49.09 kg/m   Physical Exam Vitals signs and nursing note reviewed.  Constitutional:      Comments: Overweight  HENT:     Head: Normocephalic and atraumatic.  Eyes:     Conjunctiva/sclera: Conjunctivae normal.  Neck:     Musculoskeletal: Neck supple.  Cardiovascular:     Rate and Rhythm: Normal rate and regular rhythm.  Pulmonary:     Effort: Pulmonary effort is normal.     Comments: Scattered rhonchi. Abdominal:     General: Bowel sounds are normal.     Palpations: Abdomen is soft.  Musculoskeletal: Normal range of motion.  Skin:    General: Skin is warm and dry.  Neurological:     Mental Status: He is oriented to person, place, and time.  Psychiatric:        Behavior: Behavior normal.      ED Treatments / Results  Labs (all labs ordered are listed, but only abnormal results are displayed) Labs Reviewed  COMPREHENSIVE METABOLIC PANEL - Abnormal; Notable for the following components:      Result Value   Glucose, Bld 131 (*)    Calcium 8.8 (*)    Alkaline Phosphatase 24 (*)    Anion gap 4 (*)    All other components within normal limits  CBC WITH DIFFERENTIAL/PLATELET  TROPONIN I    EKG EKG Interpretation  Date/Time:  Sunday September 15 2018 17:52:46 EST Ventricular Rate:  60 PR Interval:  134 QRS Duration: 86 QT Interval:  418 QTC Calculation: 418 R Axis:   26 Text Interpretation:  Normal sinus rhythm Normal ECG Confirmed by Nat Christen [phone]) on 09/15/2018 7:08:14 PM   Radiology Dg Chest 2 View  Result Date: 09/15/2018 CLINICAL DATA:  Worsening dyspnea EXAM: CHEST - 2 VIEW COMPARISON:  09/15/2018, 06/11/2018 FINDINGS: No acute airspace disease or effusion.  Stable left upper lung calcified nodule. Borderline cardiomegaly. No pneumothorax. IMPRESSION: No active cardiopulmonary disease. Electronically Signed   By: Donavan Foil M.D.   On: 09/15/2018 19:53   Dg Chest 2 View  Result Date: 09/15/2018 CLINICAL DATA:  Pt states he has been increasingly short of breath over the past few months. Worse over the past 2 weeks. Was supposed to go to pulmonology and was supposed to have cpap set up for home use but has not done either due to cost.*comment was truncated*SOB for months EXAM: CHEST - 2 VIEW COMPARISON:  9179 FINDINGS: Normal cardiac  silhouette. Mild peripheral interstitial pattern. No pleural fluid. No pneumothorax. No focal consolidation. IMPRESSION: Mild interstitial edema pattern.  No pneumonia. Electronically Signed   By: Suzy Bouchard M.D.   On: 09/15/2018 08:27    Procedures Procedures (including critical care time)  Medications Ordered in ED Medications  furosemide (LASIX) injection 40 mg (40 mg Intravenous Given 09/15/18 1934)  ipratropium-albuterol (DUONEB) 0.5-2.5 (3) MG/3ML nebulizer solution 3 mL (3 mLs Nebulization Given 09/15/18 2100)  methylPREDNISolone sodium succinate (SOLU-MEDROL) 125 mg/2 mL injection 125 mg (125 mg Intravenous Given 09/15/18 2044)     Initial Impression / Assessment and Plan / ED Course  I have reviewed the triage vital signs and the nursing notes.  Pertinent labs & imaging results that were available during my care of the patient were reviewed by me and considered in my medical decision making (see chart for details).     Will recommend admission for further evaluation of dyspnea and chest pain  Final Clinical Impressions(s) / ED Diagnoses   Final diagnoses:  Dyspnea, unspecified type  Chest pain, unspecified type    ED Discharge Orders    None       Nat Christen, MD 09/15/18 2113

## 2018-09-15 NOTE — ED Notes (Signed)
Pt was ambulated with pulse oximetry. O2 sats fluctuated in between 96 and 98.

## 2018-09-15 NOTE — ED Notes (Signed)
Respiratory notified of patient's neb treatment. 

## 2018-09-15 NOTE — ED Provider Notes (Addendum)
[facility] EMERGENCY DEPARTMENT Provider Note   CSN: [id number] Arrival date & time: 09/15/18  7169     History   Chief Complaint Chief Complaint  Patient presents with  . Shortness of Breath    HPI Thomas Ball is a 57 y.o. male.  HPI  57 year old male comes in with chief complaint of shortness of breath.  Patient has history of hypertension, hyperlipidemia, extensive smoking history. He reports that he has been having shortness of breath off and on for the last several days.  He is also had intermittent episodes of chest tightness that is midsternal.  His symptoms are intermittent with no specific evoking factors.  He has noted that when he does have pain, exertion makes his pain and the shortness of breath worse.  None of these symptoms are new and have been present for at least a few months, however he is noticing that the frequency and the duration of his symptoms are lasting longer.  Patient used to smoke 2 packs a day, now he is smoking 1 pack a day.  He denies any underlying history of lung problems.  There is no abdominal pain, back pain.  He denies any new cough or wheezing.  Past Medical History:  Diagnosis Date  . GERD (gastroesophageal reflux disease)   . HCV (hepatitis C virus) DR. ZACKS   AUG 2013 VIRAL LOAD UNDETECTABLE  . Hypercholesteremia   . Hypertension   . Pancreatitis, gallstone   . Skin cancer 1992   removed from side of head  . SVT (supraventricular tachycardia) [facility])     Patient Active Problem List   Diagnosis Date Noted  . Essential hypertension 04/26/2018  . OSA (obstructive sleep apnea) 03/04/2018  . BPH (benign prostatic hyperplasia) 10/07/2015  . EIC (epidermal inclusion cyst) 07/23/2015  . Tobacco abuse 07/02/2015  . SVT (supraventricular tachycardia) ([place])   . Intermittent palpitations 04/21/2014  . Bradycardia 04/21/2014  . Morbid obesity ([place]) 04/21/2014  . Chest pain 04/20/2014  . Cirrhosis of liver ([place]) 11/05/2013  . GERD  (gastroesophageal reflux disease) 07/18/2012  . Screening for colorectal cancer 07/18/2012  . Pulmonary nodule 02/14/2012    Past Surgical History:  Procedure Laterality Date  . 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 CHOLECYSTECTOMY    . COLONOSCOPY  10/04/2012   SLF:ONE/8 SIMPLE ADENOMA, TICS, SML IH  . ESOPHAGOGASTRODUODENOSCOPY N/A 09/04/2014   Procedure: ESOPHAGOGASTRODUODENOSCOPY (EGD);  Surgeon: Danie Binder, MD;  Location[facility] ENDO [facility];  Service: Endoscopy;  Laterality: N/A;  145  . GALLBLADDER SURGERY    . JAW BROKEN    . LIPOMA REMOVALS     SEVERAL  . SUPRAVENTRICULAR TACHYCARDIA ABLATION N/A 06/22/2014   Procedure: SUPRAVENTRICULAR TACHYCARDIA ABLATION;  Surgeon: Evans Lance, MD;  Location: [facility] CATH LAB;  Service: Cardiovascular;  Laterality: N/A;        Home Medications    Prior to Admission medications   Medication Sig Start Date End Date Taking? Authorizing Provider  albuterol (PROVENTIL) (2.5 MG/3ML) 0.083% nebulizer solution Take 3 mLs (2.5 mg total) by nebulization every 6 (six) hours as needed for wheezing or shortness of breath. 07/26/18  Yes Eustaquio Maize, MD  atorvastatin (LIPITOR) 40 MG tablet Take 1 tablet (40 mg total) by mouth daily at 6 PM. 09/09/18  Yes Gottschalk, Ashly M, DO  budesonide-formoterol (SYMBICORT) 160-4.5 MCG/ACT inhaler Inhale 2 puffs into the lungs 2 (two) times daily. 07/26/18  Yes Eustaquio Maize, MD  doxazosin (CARDURA)  1 MG tablet Take 1 tablet (1 mg total) by mouth daily. 09/09/18  Yes Gottschalk, Leatrice Jewels M, DO  omeprazole (PRILOSEC) 20 MG capsule TAKE ONE (1) CAPSULE EACH DAY 09/09/18  Yes [name], DO    Family History Family History  Problem Relation Age of Onset  . Emphysema Mother   . COPD Mother   . Early death Mother   . Hyperlipidemia Father   . Stroke Father   . Diabetes Father   . High Cholesterol Father   . Colon cancer Neg Hx     Social History Social History   Tobacco Use  .  Smoking status: Current Every Day Smoker    Packs/day: 1.50    Years: 35.00    Pack years: 52.50    Types: Cigarettes  . Smokeless tobacco: Never Used  Substance Use Topics  . Alcohol use: No  . Drug use: No    Comment: in the 1980s, smoke crack. None since Dec 2011.      Allergies   Patient has no known allergies.   Review of Systems Review of Systems  Constitutional: Positive for activity change. Negative for diaphoresis.  Respiratory: Positive for shortness of breath.   Cardiovascular: Positive for chest pain.  Gastrointestinal: Negative for nausea and vomiting.  All other systems reviewed and are negative.    Physical Exam Updated Vital Signs BP 127/85   Pulse 62   Temp (!) 97.5 F (36.4 C) (Oral)   Resp 18   Ht 5\' 5"  (1.651 m)   Wt 136 kg   SpO2 98%   BMI 49.89 kg/m   Physical Exam Vitals signs and nursing note reviewed.  Constitutional:      Appearance: He is well-developed.  HENT:     Head: Atraumatic.  Neck:     Musculoskeletal: Neck supple.  Cardiovascular:     Rate and Rhythm: Normal rate.     Pulses: Normal pulses.  Pulmonary:     Effort: Pulmonary effort is normal.     Breath sounds: Decreased breath sounds present. No wheezing, rhonchi or rales.  Skin:    General: Skin is warm.  Neurological:     Mental Status: He is alert and oriented to person, place, and time.      ED Treatments / Results  Labs (all labs ordered are listed, but only abnormal results are displayed) Labs Reviewed  BASIC METABOLIC PANEL - Abnormal; Notable for the following components:      Result Value   Sodium 134 (*)    Glucose, Bld 156 (*)    Calcium 8.5 (*)    All other components within normal limits  BRAIN NATRIURETIC PEPTIDE - Abnormal; Notable for the following components:   B Natriuretic Peptide 200.0 (*)    All other components within normal limits  CBC WITH DIFFERENTIAL/PLATELET  TROPONIN I  TROPONIN I    EKG EKG  Interpretation  Date/Time:  Sunday September 15 2018 07:35:45 EST Ventricular Rate:  66 PR Interval:    QRS Duration: 90 QT Interval:  401 QTC Calculation: 421 R Axis:   22 Text Interpretation:  Sinus rhythm No acute changes No old tracing to compare Confirmed by Varney Biles [phone]) on 09/15/2018 8:01:35 AM   Radiology Dg Chest 2 View  Result Date: 09/15/2018 CLINICAL DATA:  Pt states he has been increasingly short of breath over the past few months. Worse over the past 2 weeks. Was supposed to go to pulmonology and was supposed to have cpap set  up for home use but has not done either due to cost.*comment was truncated*SOB for months EXAM: CHEST - 2 VIEW COMPARISON:  9179 FINDINGS: Normal cardiac silhouette. Mild peripheral interstitial pattern. No pleural fluid. No pneumothorax. No focal consolidation. IMPRESSION: Mild interstitial edema pattern.  No pneumonia. Electronically Signed   By: Suzy Bouchard M.D.   On: 09/15/2018 08:27    Procedures Procedures (including critical care time)  Medications Ordered in ED Medications  ipratropium-albuterol (DUONEB) 0.5-2.5 (3) MG/3ML nebulizer solution 3 mL (3 mLs Nebulization Given 09/15/18 0942)  aspirin chewable tablet 324 mg (324 mg Oral Given 09/15/18 0957)     Initial Impression / Assessment and Plan / ED Course  I have reviewed the triage vital signs and the nursing notes.  Pertinent labs & imaging results that were available during my care of the patient were reviewed by me and considered in my medical decision making (see chart for details).  Clinical Course as of Sep 16 1435  Sun Sep 15, 2018  1121 BNP is elevated 200.  Initial troponin was negative, repeat ordered. We will get ambulatory pulse ox as well. Case will be discussed with cardiology.  It appears that patient might have a mild CHF based on the chest x-ray.  Discussion will be based around the idea of outpatient management versus admission and inpatient  diagnostic work-up.  B Natriuretic Peptide(!): 200.0 [AN]    Clinical Course User Index [AN] Varney Biles, MD    57 year old male comes in a chief complaint of chest discomfort and shortness of breath. He is a vague historian.  He is got history of hypertension, hyperlipidemia stents smoking history, OSA. It appears that he has had all of these symptoms for the last several months, but over the past few days he has noted increased frequency and duration.  It also appears that his symptoms are not always evoked by exertion, but when they are present they do get worse with exertion.  On exam he has poor aeration diffusely over his lungs, but no wheezing. His radial pulse bilaterally are equal.  At this time suspicion for dissection is low.  Differential mostly is that patient's underlying symptoms are because of COPD or ACS. Plan is to get delta troponin.  If the trops are negative then we will advised that patient get a close follow-up with cardiology for further workup.  Reassessment: Discussed case with Dr. Johnsie Cancel, cardiology.  He recommended that it would be better for patient to be admitted, and for the admitting team to consult cardiology for possible stress tomorrow and order echocardiogram.  Discussed these recommendations from cardiology to the patient. Patient wants to leave against medical advice. Patient understands that his actions will lead to inadequate medical workup, and that he is at risk of complications of missed diagnosis, which includes morbidity and mortality.  He states that his wife is coming home today and he has not seen in 3 months.  Therefore he would rather go home see his wife and return tomorrow.  Alternative options discussed : Likely a short admission, with discharge tomorrow at which time he can go stay with his wife. Opportunity to change mind given. Patient is demonstrating good capacity to make decision. Patient understands that he/she needs to  return to the ER immediately if his/her symptoms get worse.    Final Clinical Impressions(s) / ED Diagnoses   Final diagnoses:  Dyspnea on exertion  Precordial chest pain  Congestive heart failure, unspecified HF chronicity, unspecified heart failure type (  [facility])    ED Discharge Orders    None       Varney Biles, MD 09/15/18 6381     Varney Biles, MD 09/15/18 1439

## 2018-09-15 NOTE — Discharge Instructions (Signed)
We suspect that you might be having heart failure.  We also do think you need to get work-up for ischemic heart disease. We wanted you to be admitted to the hospital, however you have decided to go home.  Please come back to the ER if you desire to get admitted to the hospital and get the work-up done.  Please return to the ER if you have worsening chest pain, shortness of breath, pain radiating to your jaw, shoulder, or back, sweats or fainting. Otherwise see the Cardiologist or your primary care doctor as requested.

## 2018-09-15 NOTE — ED Notes (Signed)
Pt c/o intermittent chest pain that has continued through the day,

## 2018-09-15 NOTE — ED Triage Notes (Signed)
Pt states he has been increasingly short of breath over the past few months.  Worse over the past 2 weeks.  Was supposed to go to pulmonology and was supposed to have cpap set up for home use but has not done either due to cost.  Has been using nebulizers as rx.  Continues to smoke ppd.

## 2018-09-15 NOTE — H&P (Signed)
History and Physical    Thomas Ball:[id number] DOB: Feb 01, 1961 DOA: 09/15/2018  PCP: [name], DO  Patient coming from:  home  Chief Complaint:  Cp and sob  HPI: Thomas Ball is a 57 y.o. male with medical history significant of osa trying to get a cpap but cant afford it, htn, obesity comes in with weeks of sob and doe with associated occasional episodes of sscp pressure that are brief only lasting seconds.  No cough.  No fevers.  No swelling or edema.  No h/o chf.  This is pt second er visit today, has already had 3 neg enzymes.  [facility] who recommended pt to be admitted for cardiology evaluation.   Review of Systems: As per HPI otherwise 10 point review of systems negative.   Past Medical History:  Diagnosis Date  . GERD (gastroesophageal reflux disease)   . HCV (hepatitis C virus) DR. ZACKS   AUG 2013 VIRAL LOAD UNDETECTABLE  . Hypercholesteremia   . Hypertension   . Pancreatitis, gallstone   . Skin cancer 1992   removed from side of head  . SVT (supraventricular tachycardia) (HCC)     Past Surgical History:  Procedure Laterality Date  . 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 CHOLECYSTECTOMY    . COLONOSCOPY  10/04/2012   SLF:ONE/8 SIMPLE ADENOMA, TICS, SML IH  . ESOPHAGOGASTRODUODENOSCOPY N/A 09/04/2014   Procedure: ESOPHAGOGASTRODUODENOSCOPY (EGD);  Surgeon: Thomas Binder, MD;  Location[facility];  Service: Endoscopy;  Laterality: N/A;  145  . GALLBLADDER SURGERY    . JAW BROKEN    . LIPOMA REMOVALS     SEVERAL  . SUPRAVENTRICULAR TACHYCARDIA ABLATION N/A 06/22/2014   Procedure: SUPRAVENTRICULAR TACHYCARDIA ABLATION;  Surgeon: Evans Lance, MD;  Location: [facility] CATH LAB;  Service: Cardiovascular;  Laterality: N/A;     reports that he has been smoking cigarettes. He has a 52.50 pack-year smoking history. He has never used smokeless tobacco. He reports that he does not drink alcohol or use drugs.  No Known  Allergies  Family History  Problem Relation Age of Onset  . Emphysema Mother   . COPD Mother   . Early death Mother   . Hyperlipidemia Father   . Stroke Father   . Diabetes Father   . High Cholesterol Father   . Colon cancer Neg Hx     Prior to Admission medications   Medication Sig Start Date End Date Taking? Authorizing Provider  albuterol (PROVENTIL) (2.5 MG/3ML) 0.083% nebulizer solution Take 3 mLs (2.5 mg total) by nebulization every 6 (six) hours as needed for wheezing or shortness of breath. 07/26/18  Yes Eustaquio Maize, MD  atorvastatin (LIPITOR) 40 MG tablet Take 1 tablet (40 mg total) by mouth daily at 6 PM. Patient taking differently: Take 40 mg by mouth daily.  09/09/18  Yes Gottschalk, Leatrice Jewels M, DO  budesonide-formoterol (SYMBICORT) 160-4.5 MCG/ACT inhaler Inhale 2 puffs into the lungs 2 (two) times daily. 07/26/18  Yes Eustaquio Maize, MD  doxazosin (CARDURA) 1 MG tablet Take 1 tablet (1 mg total) by mouth daily. 09/09/18  Yes Gottschalk, Ashly M, DO  omeprazole (PRILOSEC) 20 MG capsule TAKE ONE (1) [place] Patient taking differently: Take 20 mg by mouth daily.  09/09/18  Yes [name], DO    Physical Exam: Vitals:   09/15/18 1747 09/15/18 1748 09/15/18 1930 09/15/18 2101  BP:  128/65  Pulse:  65    Resp:  20 20   Temp:  98 F (36.7 C)    TempSrc:  Oral    SpO2:  95%  97%  Weight: 133.8 kg     Height: 5\' 5"  (1.651 m)         Constitutional: NAD, calm, comfortable Vitals:   09/15/18 1747 09/15/18 1748 09/15/18 1930 09/15/18 2101  BP:  128/65    Pulse:  65    Resp:  20 20   Temp:  98 F (36.7 C)    TempSrc:  Oral    SpO2:  95%  97%  Weight: 133.8 kg     Height: 5\' 5"  (1.651 m)      Eyes: PERRL, lids and conjunctivae normal ENMT: Mucous membranes are moist. Posterior pharynx clear of any exudate or lesions.Normal dentition.  Neck: normal, supple, no masses, no thyromegaly Respiratory: clear to auscultation bilaterally, mild exp  wheezing, no crackles. Normal respiratory effort. No accessory muscle use.  Cardiovascular: Regular rate and rhythm, no murmurs / rubs / gallops. No extremity edema. 2+ pedal pulses. No carotid bruits.  Abdomen: no tenderness, no masses palpated. No hepatosplenomegaly. Bowel sounds positive.  Musculoskeletal: no clubbing / cyanosis. No joint deformity upper and lower extremities. Good ROM, no contractures. Normal muscle tone.  Skin: no rashes, lesions, ulcers. No induration Neurologic: CN 2-12 grossly intact. Sensation intact, DTR normal. Strength 5/5 in all 4.  Psychiatric: Normal judgment and insight. Alert and oriented x 3. Normal mood.    Labs on Admission: I have personally reviewed following labs and imaging studies  CBC: Recent Labs  Lab 09/15/18 0743 09/15/18 1930  WBC 6.0 6.8  NEUTROABS 4.3 4.7  HGB 14.1 13.9  HCT 42.0 42.9  MCV 96.1 96.0  PLT 162 546   Basic Metabolic Panel: Recent Labs  Lab 09/09/18 1010 09/15/18 0743 09/15/18 1930  NA 138 134* 135  K 4.9 4.1 4.1  CL 96 104 105  CO2 24 25 26   GLUCOSE 122* 156* 131*  BUN 15 15 16   CREATININE 0.97 0.72 0.73  CALCIUM 9.5 8.5* 8.8*   GFR: Estimated Creatinine Clearance: 131.8 mL/min (by C-G formula based on SCr of 0.73 mg/dL). Liver Function Tests: Recent Labs  Lab 09/09/18 1010 09/15/18 1930  AST 16 17  ALT 23 24  ALKPHOS 33* 24*  BILITOT 0.7 1.2  PROT 6.8 6.8  ALBUMIN 4.2 3.9   No results for input(s): LIPASE, AMYLASE in the last 168 hours. No results for input(s): AMMONIA in the last 168 hours. Coagulation Profile: No results for input(s): INR, PROTIME in the last 168 hours. Cardiac Enzymes: Recent Labs  Lab 09/15/18 0743 09/15/18 1153 09/15/18 1930  TROPONINI <0.03 <0.03 <0.03   BNP (last 3 results) No results for input(s): PROBNP in the last 8760 hours. HbA1C: No results for input(s): HGBA1C in the last 72 hours. CBG: No results for input(s): GLUCAP in the last 168 hours. Lipid  Profile: No results for input(s): CHOL, HDL, LDLCALC, TRIG, CHOLHDL, LDLDIRECT in the last 72 hours. Thyroid Function Tests: No results for input(s): TSH, T4TOTAL, FREET4, T3FREE, THYROIDAB in the last 72 hours. Anemia Panel: No results for input(s): VITAMINB12, FOLATE, FERRITIN, TIBC, IRON, RETICCTPCT in the last 72 hours. Urine analysis:    Component Value Date/Time   COLORURINE YELLOW 06/25/2014 [place] 06/25/2014 1652   LABSPEC 1.020 06/25/2014 1652   PHURINE 6.5 06/25/2014 1652   GLUCOSEU NEGATIVE 06/25/2014 1652   HGBUR NEGATIVE  06/25/2014 [place] 06/25/2014 [place] 06/25/2014 1652   PROTEINUR NEGATIVE 06/25/2014 1652   UROBILINOGEN 1.0 06/25/2014 1652   NITRITE NEGATIVE 06/25/2014 1652   LEUKOCYTESUR NEGATIVE 06/25/2014 1652   Sepsis Labs: !!!!!!!!!!!!!!!!!!!!!!!!!!!!!!!!!!!!!!!!!!!! @LABRCNTIP (procalcitonin:4,lacticidven:4) )No results found for this or any previous visit (from the past 240 hour(s)).   Radiological Exams on Admission: Dg Chest 2 View  Result Date: 09/15/2018 CLINICAL DATA:  Worsening dyspnea EXAM: CHEST - 2 VIEW COMPARISON:  09/15/2018, 06/11/2018 FINDINGS: No acute airspace disease or effusion. Stable left upper lung calcified nodule. Borderline cardiomegaly. No pneumothorax. IMPRESSION: No active cardiopulmonary disease. Electronically Signed   By: Donavan Foil M.D.   On: 09/15/2018 19:53   Dg Chest 2 View  Result Date: 09/15/2018 CLINICAL DATA:  Pt states he has been increasingly short of breath over the past few months. Worse over the past 2 weeks. Was supposed to go to pulmonology and was supposed to have cpap set up for home use but has not done either due to cost.*comment was truncated*SOB for months EXAM: CHEST - 2 VIEW COMPARISON:  9179 FINDINGS: Normal cardiac silhouette. Mild peripheral interstitial pattern. No pleural fluid. No pneumothorax. No focal consolidation. IMPRESSION: Mild  interstitial edema pattern.  No pneumonia. Electronically Signed   By: Suzy Bouchard M.D.   On: 09/15/2018 08:27    EKG: Independently reviewed. nsr no acute changes Old chart reviewed Case discussed with dr Lacinda Axon in ed cxr reviewed mild edema  Assessment/Plan 57 yo male with sob and cp  Principal Problem:   SOB (shortness of breath)- chronic and likely due to untreated osa but may also have rt sided heart failure component.  Given a dose of lasix in the ed. Mild edema on cxr.  oxgyen sats normal.  Obtain cardiac echo in the am.  Obtain cardiology consult in am also.    Active Problems:   Chest pain- asa.  Echo in am, ekg nonischemic   Cirrhosis of liver ([place])- noted   Morbid obesity ([place])- noted   Tobacco abuse- noted   OSA (obstructive sleep apnea)- sw consult to help with any assistance   Essential hypertension- stable     DVT prophylaxis: scds Code Status:  full Family Communication: none Disposition Plan: tomorrow Consults called:  cardiology Admission status:  observation   Rashed Edler A MD [facility]  If 7PM-7AM, please contact night-coverage [email or web address] Password [facility]  09/15/2018, 9:33 PM

## 2018-09-15 NOTE — ED Triage Notes (Signed)
Pt returns to er for admission, was seen earlier in er for sob, states " I had something to do earlier"

## 2018-09-16 ENCOUNTER — Encounter (HOSPITAL_COMMUNITY): Payer: Self-pay | Admitting: Cardiology

## 2018-09-16 ENCOUNTER — Observation Stay (HOSPITAL_BASED_OUTPATIENT_CLINIC_OR_DEPARTMENT_OTHER): Payer: Self-pay

## 2018-09-16 DIAGNOSIS — R0602 Shortness of breath: Secondary | ICD-10-CM

## 2018-09-16 DIAGNOSIS — Z720 Tobacco use: Secondary | ICD-10-CM

## 2018-09-16 DIAGNOSIS — I10 Essential (primary) hypertension: Secondary | ICD-10-CM

## 2018-09-16 DIAGNOSIS — R0609 Other forms of dyspnea: Secondary | ICD-10-CM

## 2018-09-16 DIAGNOSIS — G4733 Obstructive sleep apnea (adult) (pediatric): Secondary | ICD-10-CM

## 2018-09-16 DIAGNOSIS — R0600 Dyspnea, unspecified: Secondary | ICD-10-CM

## 2018-09-16 LAB — CBC
HCT: 44.4 % (ref 39.0–52.0)
Hemoglobin: 14.8 g/dL (ref 13.0–17.0)
MCH: 32.3 pg (ref 26.0–34.0)
MCHC: 33.3 g/dL (ref 30.0–36.0)
MCV: 96.9 fL (ref 80.0–100.0)
Platelets: 163 10*3/uL (ref 150–400)
RBC: 4.58 MIL/uL (ref 4.22–5.81)
RDW: 12.6 % (ref 11.5–15.5)
WBC: 8.1 10*3/uL (ref 4.0–10.5)
nRBC: 0 % (ref 0.0–0.2)

## 2018-09-16 LAB — ECHOCARDIOGRAM COMPLETE
Height: 65 in
Weight: 4797.21 oz

## 2018-09-16 LAB — BASIC METABOLIC PANEL
ANION GAP: 9 (ref 5–15)
BUN: 20 mg/dL (ref 6–20)
CALCIUM: 9 mg/dL (ref 8.9–10.3)
CO2: 25 mmol/L (ref 22–32)
Chloride: 101 mmol/L (ref 98–111)
Creatinine, Ser: 0.86 mg/dL (ref 0.61–1.24)
GFR calc Af Amer: >60 mL/min (ref >60)
GFR calc non Af Amer: >60 mL/min (ref >60)
Glucose, Bld: 260 mg/dL — ABNORMAL HIGH (ref 70–99)
Potassium: 4.6 mmol/L (ref 3.5–5.1)
Sodium: 135 mmol/L (ref 135–145)

## 2018-09-16 LAB — GLUCOSE, CAPILLARY: Glucose-Capillary: 189 mg/dL — ABNORMAL HIGH (ref 70–99)

## 2018-09-16 LAB — BRAIN NATRIURETIC PEPTIDE: B Natriuretic Peptide: 178 pg/mL — ABNORMAL HIGH (ref 0.0–100.0)

## 2018-09-16 MED ORDER — UMECLIDINIUM BROMIDE 62.5 MCG/INH IN AEPB
1.0000 | INHALATION_SPRAY | Freq: Every day | RESPIRATORY_TRACT | Status: DC
  Administered 2018-09-17: 1 via RESPIRATORY_TRACT
  Filled 2018-09-16: qty 7

## 2018-09-16 MED ORDER — TIOTROPIUM BROMIDE MONOHYDRATE 18 MCG IN CAPS: 18.0000 ug | ORAL_CAPSULE | Freq: Every day | RESPIRATORY_TRACT | Status: DC

## 2018-09-16 MED ORDER — ALUM & MAG HYDROXIDE-SIMETH 200-200-20 MG/5ML PO SUSP: 30.0000 mL | Freq: Four times a day (QID) | ORAL | Status: DC | PRN

## 2018-09-16 MED ORDER — LIDOCAINE VISCOUS HCL 2 % MT SOLN: 15.0000 mL | Freq: Four times a day (QID) | OROMUCOSAL | Status: DC | PRN

## 2018-09-16 MED ORDER — MOMETASONE FURO-FORMOTEROL FUM 100-5 MCG/ACT IN AERO
2.0000 | INHALATION_SPRAY | Freq: Two times a day (BID) | RESPIRATORY_TRACT | Status: DC
  Administered 2018-09-16 – 2018-09-17 (×2): 2 via RESPIRATORY_TRACT
  Filled 2018-09-16 (×2): qty 8.8

## 2018-09-16 MED ORDER — PANTOPRAZOLE SODIUM 40 MG PO TBEC
40.0000 mg | DELAYED_RELEASE_TABLET | Freq: Every day | ORAL | Status: DC
  Administered 2018-09-16 – 2018-09-17 (×2): 40 mg via ORAL
  Filled 2018-09-16 (×2): qty 1

## 2018-09-16 NOTE — Care Management (Signed)
Patient Information   Patient Name Gerritt, Galentine ([id number]) Sex Male DOB [birth year]  Room Bed  A325 A325-01  Patient Demographics   Address [place] [address] Phone [phone] (Home) [phone] (Mobile) E-mail Address [email or web address]  Patient Ethnicity & Race   Ethnic Group Patient Race  Not Hispanic or Latino White or Caucasian  Emergency Contact(s)   Name Relation Home Work Mobile  [place] Spouse [phone]  [phone]  Documents on File    Status Date Received Description  Documents for the Patient  [place] Not Received    [place] E-Signature HIPAA Notice of Privacy Received 12/12/10   [place] E-Signature HIPAA Notice of Privacy [place] Signed 76/54/65   Driver's License Received 03/54/65   Advance Directives/Living Will/HCPOA/POA Not Received    Insurance Card Received 01/18/12   EMR Problem Summary Not Received    EMR Patient Summary Not Received    AMB Correspondence Not Received  Confidentiality Statement   AMB Correspondence Not Received  GI 03/12 [facility]  AMB Correspondence Not Received  Office note 03/12 [facility]  Financial Application Not Received  CAFA APP AND SUPP DOCS 08 15 2018  Insurance Card Not Received    Release of Information Not Received    HIM ROI Authorization Not Received    Insurance Card Not Received    Driver's License Not Received    Insurance Card Received 07/19/12   Insurance Card Not Received    AMB Correspondence Not Received  10/13 Acknowledgement RGA  HIM ROI Authorization Not Received    Release of Information  11/03/13 PSCAN ENTERED INFO: [id number]  Insurance Card Received 11/05/13   Insurance Card  11/05/13   Insurance Card Received 12/05/13 Patient Assistance Form exp 0/1/74  Financial Application Received 94/49/67 FINANCIAL APPLICATION  HIM ROI Authorization  05/27/14   AMB Correspondence  09/04/14 LETTER FIELDS MD, S   Other Photo ID Not Received    Advanced Beneficiary Notice (ABN) Not Received    E-Signature AOB [place] Not Received    Insurance Card   Finanicail Assistance Termination Date 04/17/17  Release of Information Received 01/10/18 [facility]  AMB Correspondence Received 02/06/18 CLINICIAN AUTHORIZATION FORM [facility]  Financial Application   CAFA APP/SUPP DOCS -- 5/91/63  Financial Application   CAFA APP/MISSING DOC -- 04/19/18  Insurance Card Received 04/26/18 [facility] 03/04/18-09/03/18  Release of Information Received 05/17/18 DPR 2019 [place] Card Received 07/26/18   Documents for the Encounter  AOB (Assignment of Insurance Benefits) Not Received    E-signature AOB Signed 09/15/18   MEDICARE RIGHTS Not Received    E-signature Medicare Rights     ED Patient Billing Extract   ED PB Billing Extract  Cardiac Monitoring Strip Received 09/15/18   Cardiac Monitoring Strip Shift Summary Received 09/16/18   EKG Received 09/16/18   Admission Information   Current Information   Attending Provider Admitting Provider Admission Type Admission Status  Eugenie Filler, MD Phillips Grout, MD Emergency Admission (Confirmed)       Admission Date/Time Discharge Date Hospital Service Auth/Cert Status  84/66/59 06:26 PM  Internal Medicine [place] [facility] Room/Bed   [facility] AP-DEPT 300 A325/A325-01        Admission   Complaint  Dyspnea  Hospital Account   Name Acct ID Class Status Primary Coverage  Sher, [place] [id number] Observation Open None  Guarantor Account (for Hospital Account [id number])   Name Relation to Pt Service Area Active? Acct Type  Corey Skains Self [facility] Yes Personal/Family  Address Phone    [address] [place], [place] [address] [phone])        Coverage Information (for [place] [id number])   Not on file       Care Everywhere ID:  [id number]

## 2018-09-16 NOTE — Clinical Social Work Note (Signed)
CSW consult received to assist pt with needs for obstructive sleep apnea. Discussed with RN CM, Janett Billow, who will follow up on this issue. Will clear the consult at this time.

## 2018-09-16 NOTE — Progress Notes (Addendum)
PROGRESS NOTE    Thomas Ball  JJO:[id number] DOB: 1961-04-13 DOA: 09/15/2018 PCP: [name], DO    Brief Narrative:  Per Dr. Melba Coon is a 57 y.o. male with medical history significant of osa trying to get a cpap but cant afford it, htn, obesity comes in with weeks of sob and doe with associated occasional episodes of sscp pressure that are brief only lasting seconds.  No cough.  No fevers.  No swelling or edema.  No h/o chf.  This is pt second er visit today, has already had 3 neg enzymes.  [facility] who recommended pt to be admitted for cardiology evaluation.    Assessment & Plan:   Principal Problem:   SOB (shortness of breath) Active Problems:   Cirrhosis of liver (HCC)   Chest pain   Morbid obesity (HCC)   Tobacco abuse   OSA (obstructive sleep apnea)   Essential hypertension  1 shortness of breath Likely multifactorial secondary to probable obstructive sleep apnea with inability to afford CPAP at this time, probable emphysema, morbid obesity.  Patient did state that he is been having progressive shortness of breath on exertion intermittent in nature with some intermittent chest discomfort.  EKG with no ischemic changes.  Cardiac enzymes negative x3.  2D echo pending.  Patient was given IV Lasix in the ED yesterday properly recorded.  Patient however states had good urine output.  Respiratory exam without any crackles or wheezes noted.  Patient has been seen in consultation by cardiology who feel that this is unlikely ACS.  It is noted per cardiology that patient had a negative Lexiscan Myoview in November 2018.  Social worker consulted for help with obtaining a CPAP.  Cardiology following and appreciate their input and recommendations.  2.  Morbid obesity Weight loss stressed to patient.  3.  Tobacco abuse Tobacco cessation stressed to patient.  Place on [place] and spiriva.  Nebs as needed.  4.  Obstructive sleep apnea Placed  on CPAP nightly while in-house.  Social work consulted to see whether patient can be helped to obtain a CPAP at home.  5.  Hypertension Continue Cardura.   DVT prophylaxis: SCDs Code Status: Full Family Communication: Updated patient and wife at bedside. Disposition Plan: Likely home when clinically improved.   Consultants:   Cardiology: Dr. Domenic Polite 09/16/2018  Procedures:  Chest x-ray 09/15/2018  2D echo pending  Antimicrobials:   None   Subjective: Patient laying in bed.  States had significant amount of urine output yesterday after being given a dose of Lasix in the emergency room.  Denies any chest pain.  Feels his shortness of breath is improved since admission however states his been having shortness of breath on and off intermittently chronically.  Objective: Vitals:   09/16/18 0557 09/16/18 0827 09/16/18 0910 09/16/18 1000  BP: 124/81  121/75 115/81  Pulse: (!) 53   63  Resp: 16   20  Temp: 98.4 F (36.9 C)   98.3 F (36.8 C)  TempSrc: Oral   Oral  SpO2: 97%   95%  Weight:  136 kg    Height:        Intake/Output Summary (Last 24 hours) at 09/16/2018 1247 Last data filed at 09/16/2018 1213 Gross per 24 hour  Intake 480 ml  Output -  Net 480 ml   Filed Weights   09/15/18 1747 09/15/18 2210 09/16/18 0827  Weight: 133.8 kg 136 kg 136 kg  Examination:  General exam: Appears calm and comfortable  Respiratory system: Clear to auscultation. Respiratory effort normal. Cardiovascular system: S1 & S2 heard, RRR. No JVD, murmurs, rubs, gallops or clicks. No pedal edema. Gastrointestinal system: Abdomen is distended, obese, soft, positive bowel sounds.  No rebound.  No guarding.  Central nervous system: Alert and oriented. No focal neurological deficits. Extremities: Symmetric 5 x 5 power. Skin: No rashes, lesions or ulcers Psychiatry: Judgement and insight appear normal. Mood & affect appropriate.     Data Reviewed: I have personally reviewed  following labs and imaging studies  CBC: Recent Labs  Lab 09/15/18 0743 09/15/18 1930 09/16/18 0914  WBC 6.0 6.8 8.1  NEUTROABS 4.3 4.7  --   HGB 14.1 13.9 14.8  HCT 42.0 42.9 44.4  MCV 96.1 96.0 96.9  PLT 162 150 660   Basic Metabolic Panel: Recent Labs  Lab 09/15/18 0743 09/15/18 1930 09/16/18 0914  NA 134* 135 135  K 4.1 4.1 4.6  CL 104 105 101  CO2 25 26 25   GLUCOSE 156* 131* 260*  BUN 15 16 20   CREATININE 0.72 0.73 0.86  CALCIUM 8.5* 8.8* 9.0   GFR: Estimated Creatinine Clearance: 123.9 mL/min (by C-G formula based on SCr of 0.86 mg/dL). Liver Function Tests: Recent Labs  Lab 09/15/18 1930  AST 17  ALT 24  ALKPHOS 24*  BILITOT 1.2  PROT 6.8  ALBUMIN 3.9   No results for input(s): LIPASE, AMYLASE in the last 168 hours. No results for input(s): AMMONIA in the last 168 hours. Coagulation Profile: No results for input(s): INR, PROTIME in the last 168 hours. Cardiac Enzymes: Recent Labs  Lab 09/15/18 0743 09/15/18 1153 09/15/18 1930  TROPONINI <0.03 <0.03 <0.03   BNP (last 3 results) No results for input(s): PROBNP in the last 8760 hours. HbA1C: No results for input(s): HGBA1C in the last 72 hours. CBG: No results for input(s): GLUCAP in the last 168 hours. Lipid Profile: No results for input(s): CHOL, HDL, LDLCALC, TRIG, CHOLHDL, LDLDIRECT in the last 72 hours. Thyroid Function Tests: No results for input(s): TSH, T4TOTAL, FREET4, T3FREE, THYROIDAB in the last 72 hours. Anemia Panel: No results for input(s): VITAMINB12, FOLATE, FERRITIN, TIBC, IRON, RETICCTPCT in the last 72 hours. Sepsis Labs: No results for input(s): PROCALCITON, LATICACIDVEN in the last 168 hours.  No results found for this or any previous visit (from the past 240 hour(s)).       Radiology Studies: Dg Chest 2 View  Result Date: 09/15/2018 CLINICAL DATA:  Worsening dyspnea EXAM: CHEST - 2 VIEW COMPARISON:  09/15/2018, 06/11/2018 FINDINGS: No acute airspace disease or  effusion. Stable left upper lung calcified nodule. Borderline cardiomegaly. No pneumothorax. IMPRESSION: No active cardiopulmonary disease. Electronically Signed   By: Donavan Foil M.D.   On: 09/15/2018 19:53   Dg Chest 2 View  Result Date: 09/15/2018 CLINICAL DATA:  Pt states he has been increasingly short of breath over the past few months. Worse over the past 2 weeks. Was supposed to go to pulmonology and was supposed to have cpap set up for home use but has not done either due to cost.*comment was truncated*SOB for months EXAM: CHEST - 2 VIEW COMPARISON:  9179 FINDINGS: Normal cardiac silhouette. Mild peripheral interstitial pattern. No pleural fluid. No pneumothorax. No focal consolidation. IMPRESSION: Mild interstitial edema pattern.  No pneumonia. Electronically Signed   By: Suzy Bouchard M.D.   On: 09/15/2018 08:27        Scheduled Meds: . aspirin EC  325 mg Oral Daily  . atorvastatin  40 mg Oral q1800  . doxazosin  1 mg Oral Daily  . pantoprazole  40 mg Oral Q0600   Continuous Infusions:   LOS: 0 days    Time spent: 40 minutes    Irine Seal, MD [facility] Pager [phone]  If 7PM-7AM, please contact night-coverage [email or web address] Password [facility] 09/16/2018, 12:47 PM

## 2018-09-16 NOTE — Consult Note (Signed)
Cardiology Consultation:   Patient ID: LEONIDUS ROWAND; [id number]; 1961-06-08   Admit date: 09/15/2018 Date of Consult: 09/16/2018  Primary Care Provider: [name], DO Primary Cardiologist: Dr. Cristopher Peru   Patient Profile:   Thomas Ball is a 57 y.o. male with a history of obesity, untreated OSA, hypertension, hepatitis C, and previous SVT ablation who is being seen today for the evaluation of shortness of breath at the request of Dr. Raelene Bott.  History of Present Illness:   Thomas Ball presents to the hospital with dyspnea on exertion.  Symptoms have actually been fairly chronic, he states that he "got tired of it" and decided to be evaluated further.  He has had intermittent episodes of chest discomfort, states that these are generally brief and not necessarily provoked by exertion.  He does not indicate any recent palpitations, no dizziness or syncope.  He uses MDIs as well as nebulizer treatments at home, states that these are moderately effective.  Some days are worse than others in terms of breathlessness.  Troponin I levels were negative for ACS.  BNP is mildly increased around 200.  Chest x-ray reports no acute process.  ECG shows no acute ST segment changes.  Patient underwent a Lexiscan Myoview in November of last year that was negative for ischemia.   Past Medical History:  Diagnosis Date  . GERD (gastroesophageal reflux disease)   . HCV (hepatitis C virus) DR. ZACKS   AUG 2013 VIRAL LOAD UNDETECTABLE  . Hypercholesteremia   . Hypertension   . Pancreatitis, gallstone   . 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 SVT (supraventricular tachycardia) (HCC)     Past Surgical History:  Procedure Laterality Date  . 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 CHOLECYSTECTOMY    . COLONOSCOPY  10/04/2012   SLF:ONE/8 SIMPLE ADENOMA, TICS, SML IH  . ESOPHAGOGASTRODUODENOSCOPY N/A 09/04/2014   Procedure: ESOPHAGOGASTRODUODENOSCOPY (EGD);   Surgeon: Danie Binder, MD;  Location: [facility];  Service: Endoscopy;  Laterality: N/A;  145  . GALLBLADDER SURGERY    . JAW BROKEN    . LIPOMA REMOVALS     SEVERAL  . SUPRAVENTRICULAR TACHYCARDIA ABLATION N/A 06/22/2014   Procedure: SUPRAVENTRICULAR TACHYCARDIA ABLATION;  Surgeon: Evans Lance, MD;  Location[facility] CATH LAB;  Service: Cardiovascular;  Laterality: N/A;     Inpatient Medications: Scheduled Meds: . aspirin EC  325 mg Oral Daily  . atorvastatin  40 mg Oral q1800  . doxazosin  1 mg Oral Daily  . pantoprazole  40 mg Oral Q0600    PRN Meds: acetaminophen, albuterol, alum & mag hydroxide-simeth **AND** lidocaine, ondansetron (ZOFRAN) IV  Allergies:   No Known Allergies  Social History:   Social History   Socioeconomic History  . Marital status: Married    Spouse name: Not on file  . Number of children: Not on file  . Years of education: Not on file  . Highest education level: Not on file  Occupational History  . Occupation: concrete work    Fish farm manager: SELF-EMPLOYED  Social Needs  . Financial resource strain: Not on file  . Food insecurity:    Worry: Not on file    Inability: Not on file  . Transportation needs:    Medical: Not on file    Non-medical: Not on file  Tobacco Use  . Smoking status: Current Every Day Smoker    Packs/day: 1.50    Years: 35.00  Pack years: 52.50    Types: Cigarettes  . Smokeless tobacco: Never Used  Substance and Sexual Activity  . Alcohol use: No  . Drug use: No    Comment: in the 1980s, smoke crack. None since Dec 2011.   Marland Kitchen Sexual activity: Not on file  Lifestyle  . Physical activity:    Days per week: Not on file    Minutes per session: Not on file  . Stress: Not on file  Relationships  . Social connections:    Talks on phone: Not on file    Gets together: Not on file    Attends religious service: Not on file    Active member of club or organization: Not on file    Attends meetings of clubs or organizations:  Not on file    Relationship status: Not on file  . Intimate partner violence:    Fear of current or ex partner: Not on file    Emotionally abused: Not on file    Physically abused: Not on file    Forced sexual activity: Not on file  Other Topics Concern  . Not on file  Social History Narrative  . Not on file    Family History:   The patient's family history includes COPD in his mother; Diabetes in his father; Early death in his mother; Emphysema in his mother; High Cholesterol in his father; Hyperlipidemia in his father; Stroke in his father. There is no history of Colon cancer.  ROS:  Please see the history of present illness.  All other ROS reviewed and negative.     Physical Exam/Data:   Vitals:   09/16/18 0208 09/16/18 0557 09/16/18 0910 09/16/18 1000  BP: 124/86 124/81 121/75 115/81  Pulse: 62 (!) 53  63  Resp: 20 16  20   Temp: 98.3 F (36.8 C) 98.4 F (36.9 C)  98.3 F (36.8 C)  TempSrc: Oral Oral  Oral  SpO2: 96% 97%  95%  Weight:      Height:        Intake/Output Summary (Last 24 hours) at 09/16/2018 1044 Last data filed at 09/16/2018 0910 Gross per 24 hour  Intake 240 ml  Output -  Net 240 ml   Filed Weights   09/15/18 1747 09/15/18 2210  Weight: 133.8 kg 136 kg   Body mass index is 49.89 kg/m.   Gen: Morbidly obese male, appears comfortable at rest. HEENT: Conjunctiva and lids normal, oropharynx clear. Neck: Relatively short neck, difficult to assess JVP, no thyromegaly. Lungs: Decreased breath sounds without wheezing, nonlabored breathing at rest. Cardiac: Regular rate and rhythm, no S3 or significant systolic murmur, no pericardial rub. Abdomen: Obese, bowel sounds present, no guarding or rebound. Extremities: No pitting edema, distal pulses 2+. Skin: Warm and dry. Musculoskeletal: No kyphosis. Neuropsychiatric: Alert and oriented x3, affect grossly appropriate.  EKG:  I personally reviewed the tracing from 09/15/2018 which showed normal sinus  rhythm.  Telemetry:  I personally reviewed telemetry which shows sinus rhythm.  Relevant CV Studies:  Echocardiogram 08/06/2017: Study Conclusions  - Left ventricle: The cavity size was at the upper limits of   normal. Wall thickness was increased in a pattern of mild LVH.   Systolic function was normal. The estimated ejection fraction was   in the range of 60% to 65%. Wall motion was normal; there were no   regional wall motion abnormalities. Left ventricular diastolic   function parameters were normal. Indeterminate diastolic   function. - Pulmonic valve: There was  mild regurgitation.  Laboratory Data:  Chemistry Recent Labs  Lab 09/15/18 0743 09/15/18 1930 09/16/18 0914  NA 134* 135 135  K 4.1 4.1 4.6  CL 104 105 101  CO2 25 26 25   GLUCOSE 156* 131* 260*  BUN 15 16 20   CREATININE 0.72 0.73 0.86  CALCIUM 8.5* 8.8* 9.0  GFRNONAA >60 >60 >60  GFRAA >60 >60 >60  ANIONGAP 5 4* 9    Recent Labs  Lab 09/15/18 1930  PROT 6.8  ALBUMIN 3.9  AST 17  ALT 24  ALKPHOS 24*  BILITOT 1.2   Hematology Recent Labs  Lab 09/15/18 0743 09/15/18 1930 09/16/18 0914  WBC 6.0 6.8 8.1  RBC 4.37 4.47 4.58  HGB 14.1 13.9 14.8  HCT 42.0 42.9 44.4  MCV 96.1 96.0 96.9  MCH 32.3 31.1 32.3  MCHC 33.6 32.4 33.3  RDW 13.2 13.1 12.6  PLT 162 150 163   Cardiac Enzymes Recent Labs  Lab 09/15/18 0743 09/15/18 1153 09/15/18 1930  TROPONINI <0.03 <0.03 <0.03   No results for input(s): TROPIPOC in the last 168 hours.  BNP Recent Labs  Lab 09/15/18 0743 09/16/18 0914  BNP 200.0* 178.0*    DDimer  Recent Labs  Lab 09/15/18 1930  DDIMER 0.42    Radiology/Studies:  Dg Chest 2 View  Result Date: 09/15/2018 CLINICAL DATA:  Worsening dyspnea EXAM: CHEST - 2 VIEW COMPARISON:  09/15/2018, 06/11/2018 FINDINGS: No acute airspace disease or effusion. Stable left upper lung calcified nodule. Borderline cardiomegaly. No pneumothorax. IMPRESSION: No active cardiopulmonary disease.  Electronically Signed   By: Donavan Foil M.D.   On: 09/15/2018 19:53   Dg Chest 2 View  Result Date: 09/15/2018 CLINICAL DATA:  Pt states he has been increasingly short of breath over the past few months. Worse over the past 2 weeks. Was supposed to go to pulmonology and was supposed to have cpap set up for home use but has not done either due to cost.*comment was truncated*SOB for months EXAM: CHEST - 2 VIEW COMPARISON:  9179 FINDINGS: Normal cardiac silhouette. Mild peripheral interstitial pattern. No pleural fluid. No pneumothorax. No focal consolidation. IMPRESSION: Mild interstitial edema pattern.  No pneumonia. Electronically Signed   By: Suzy Bouchard M.D.   On: 09/15/2018 08:27    Assessment and Plan:   1.  Chronic progressive dyspnea on exertion, intermittent chest discomfort as well without definite pattern or association with exertion.  Cardiac markers argue against ACS and ECG shows no acute changes.  2.  Untreated OSA in the setting of morbid obesity.  3.  History of radiofrequency ablation of SVT by Dr. Lovena Le, no obvious recurrences.  4.  Essential hypertension, on Cardura as an outpatient.  5.  Mixed hyperlipidemia, currently on Lipitor.  Suspect multifactorial etiology of dyspnea on exertion.  He is morbidly obese with untreated OSA, does have cardiac risk factors however a negative Lexiscan Myoview as of November 2018.  Troponin I levels argue against ACS.  Echocardiogram has been ordered and pending at this time.  We will follow-up on these results.  Unless there has been a decline in LVEF or new focal wall motion abnormalities, suspect further work-up can be obtained as an outpatient.  He needs assistance getting CPAP for OSA.  We will need to see him back in the office for follow-up regarding whether repeat ischemic evaluation should be considered.  In light of progressive symptoms, it may be worth considering an outpatient right and left heart  catheterization.  Signed, Mikeal Hawthorne  Domenic Polite, MD  09/16/2018 10:44 AM

## 2018-09-16 NOTE — Progress Notes (Signed)
*  PRELIMINARY RESULTS* Echocardiogram 2D Echocardiogram has been performed.  Thomas Ball 09/16/2018, 2:55 PM

## 2018-09-17 DIAGNOSIS — R079 Chest pain, unspecified: Secondary | ICD-10-CM

## 2018-09-17 LAB — BASIC METABOLIC PANEL
Anion gap: 6 (ref 5–15)
BUN: 27 mg/dL — ABNORMAL HIGH (ref 6–20)
CO2: 29 mmol/L (ref 22–32)
CREATININE: 0.9 mg/dL (ref 0.61–1.24)
Calcium: 8.6 mg/dL — ABNORMAL LOW (ref 8.9–10.3)
Chloride: 102 mmol/L (ref 98–111)
GFR calc Af Amer: >60 mL/min (ref >60)
Glucose, Bld: 153 mg/dL — ABNORMAL HIGH (ref 70–99)
Potassium: 4.2 mmol/L (ref 3.5–5.1)
Sodium: 137 mmol/L (ref 135–145)

## 2018-09-17 MED ORDER — ASPIRIN EC 81 MG PO TBEC: 81.0000 mg | DELAYED_RELEASE_TABLET | Freq: Every day | ORAL | Status: DC

## 2018-09-17 MED ORDER — OMEPRAZOLE 40 MG PO CPDR: 40.0000 mg | DELAYED_RELEASE_CAPSULE | Freq: Every day | ORAL | 0 refills | Status: DC

## 2018-09-17 MED ORDER — UMECLIDINIUM BROMIDE 62.5 MCG/INH IN AEPB: 1.0000 | INHALATION_SPRAY | Freq: Every day | RESPIRATORY_TRACT | 0 refills | Status: DC

## 2018-09-17 NOTE — Progress Notes (Signed)
SATURATION QUALIFICATIONS: (This note is used to comply with regulatory documentation for home oxygen)  Patient Saturations on Room Air at Rest = 96%  Patient Saturations on Room Air while Ambulating =97%  Patient Saturations on 0 Liters of oxygen while Ambulating = 97%  Please briefly explain why patient needs home oxygen:

## 2018-09-17 NOTE — Progress Notes (Signed)
    Full Consult note from yesterday reviewed. Cyclic enzymes have remained negative and echocardiogram shows a preserved EF of 60-65% with no regional WMA. No plans for further inpatient cardiac evaluation at this time.   [facility] will sign off.   Medication Recommendations:  Continue PTA cardiac medications.  Other recommendations (labs, testing, etc):  None Follow up as an outpatient:  Hospital follow-up arranged for 09/27/2018 and included in AVS.   Signed, Erma Heritage, PA-C 09/17/2018, 8:48 AM Pager: [phone]

## 2018-09-17 NOTE — Progress Notes (Signed)
IV discontinued,catheter intact. Discharged patient home with instructions given on medications and follow up visits,patient verbalized understanding. Prescriptions sent with patient. Accompanied by staff to an awaiting vehicle.

## 2018-09-17 NOTE — Discharge Summary (Signed)
Physician Discharge Summary  Thomas Ball YPP:[id number] DOB: [birth year] DOA: 09/15/2018  PCP: [name], DO  Admit date: 09/15/2018 Discharge date: 09/17/2018  Time spent: 60 minutes  Recommendations for Outpatient Follow-up:  1. Follow-up with Dr. Halford Chessman, pulmonary in 2 weeks. 2. Follow-up with Bernerd Pho, [place] cardiology on 09/27/2018 3. Follow-up with [name], DO in 2 to 3 weeks.   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 male with medical history significant of osa trying to get a cpap but cant afford it, htn, obesity comes in with weeks of sob and doe with associated occasional episodes of sscp pressure that are brief only lasting seconds.  No cough.  No fevers.  No swelling or edema.  No h/o chf.  This is pt second er visit on day of admission, has already had 3 neg enzymes.  Cardiology was called at [facility] who recommended pt to be admitted for cardiology evaluation.  Hospital Course:  1 shortness of breath Likely multifactorial secondary to probable obstructive sleep apnea with inability to afford CPAP at this time, probable emphysema, morbid obesity.  Patient did state that he had been having progressive shortness of breath on exertion intermittent in nature with some intermittent chest discomfort.  EKG with no ischemic changes.  Cardiac enzymes negative x3.  2D echo was done with mild LVH, EF 60 to 65%, no wall motion abnormalities, grade 1 diastolic dysfunction, trivial mitral valvular regurgitation.  Patient was  given IV Lasix in the ED on day of admission however I/O was not properly recorded.  Patient however stated had good urine output.  Respiratory exam without any crackles or wheezes noted.  Patient has been seen in consultation by cardiology who feel that this is unlikely ACS.  It is noted per cardiology that patient had a negative Lexiscan Myoview in November 2018.  Social worker consulted for help with obtaining a CPAP.    Patient will be discharged home in stable and improved condition and will follow up with cardiology and pulmonary in the outpatient setting.   2.  Morbid obesity Weight loss stressed to patient.  3.  Tobacco abuse Tobacco cessation stressed to patient.    Patient placed on Dulera and Spiriva during the hospitalization as well as nebs as needed.  Patient be discharged back home on home regimen of Symbicort as well as Spiriva.  Outpatient follow-up.    4.  Obstructive sleep apnea Placed on CPAP nightly while in-house.  Social work consulted to see whether patient can be helped to obtain a CPAP at home.  Outpatient follow-up with pulmonary.  5.  Hypertension Patient maintained on home regimen of Cardura.  Procedures:  Chest x-ray 09/15/2018  2D echo 09/16/2018  Consultations:  Cardiology: Dr. Domenic Polite 09/16/2018   Discharge Exam: Vitals:   09/17/18 0915 09/17/18 1032  BP:    Pulse:    Resp:    Temp:    SpO2: 94% 95%    General: nad Cardiovascular: Regular rate rhythm no  murmurs rubs or gallops Respiratory: Lungs clear to auscultation bilaterally.  No wheezes, no crackles, no rhonchi.  Discharge Instructions   Discharge Instructions    Diet - low sodium heart healthy   Complete by:  As directed    Increase activity slowly   Complete by:  As directed      Allergies as of 09/17/2018   No Known Allergies     Medication List    TAKE these medications   albuterol (2.5 MG/3ML) 0.083% nebulizer solution Commonly known as:  PROVENTIL Take 3 mLs  (2.5 mg total) by nebulization every 6 (six) hours as needed for wheezing or shortness of breath.   aspirin EC 81 MG tablet Take 1 tablet (81 mg total) by mouth daily. Start taking on:  September 18, 2018   atorvastatin 40 MG tablet Commonly known as:  LIPITOR Take 1 tablet (40 mg total) by mouth daily at 6 PM. What changed:  when to take this   budesonide-formoterol 160-4.5 MCG/ACT inhaler Commonly known as:  SYMBICORT Inhale 2 puffs into the lungs 2 (two) times daily.   doxazosin 1 MG tablet Commonly known as:  CARDURA Take 1 tablet (1 mg total) by mouth daily.   omeprazole 40 MG capsule Commonly known as:  PRILOSEC Take 1 capsule (40 mg total) by mouth daily. TAKE ONE (1) CAPSULE EACH DAY What changed:    medication strength  how much to take  how to take this  when to take this   umeclidinium bromide 62.5 MCG/INH Aepb Commonly known as:  INCRUSE ELLIPTA Inhale 1 puff into the lungs daily. Start taking on:  September 18, 2018            Durable Medical Equipment  (From admission, onward)         Start     Ordered   09/16/18 1528  For home use only DME continuous positive airway pressure (CPAP)  Once    Question Answer Comment  Patient has OSA or probable OSA Yes   Is the patient currently using CPAP in the home No   Settings Autotitration   CPAP supplies needed Mask, headgear, cushions, filters, heated tubing and water chamber      09/16/18 1528         No Known Allergies Follow-up Information    Erma Heritage, PA-C Follow up on 09/27/2018.   Specialties:  Physician Assistant, Cardiology Why:  [place] on 09/27/2018 at 3:00 PM with Bernerd Pho, PA-C (works with Dr. Lovena Le).  Contact information: [address] [place] [address] [phone]        Chesley Mires, MD. Schedule an appointment as soon as possible for a visit in 2 week(s).   Specialty:  Pulmonary Disease Contact information: [address]  [place] [address] [place], [place], DO. Schedule an appointment as soon as possible for a visit in 2 week(s).   Specialty:  Family Medicine Why:  F/U IN 2-3 WEEKS. Contact information: [place] [address] [phone]            The results of significant diagnostics from this hospitalization (including imaging, microbiology, ancillary and laboratory) are listed below for reference.    Significant Diagnostic Studies: Dg Chest 2 View  Result Date: 09/15/2018 CLINICAL DATA:  Worsening dyspnea EXAM: CHEST - 2 VIEW COMPARISON:  09/15/2018, 06/11/2018 FINDINGS: No acute airspace disease or effusion. Stable left upper lung calcified  nodule. Borderline cardiomegaly. No pneumothorax. IMPRESSION: No active cardiopulmonary disease. Electronically Signed   By: Donavan Foil M.D.   On: 09/15/2018 19:53   Dg Chest 2 View  Result Date: 09/15/2018 CLINICAL DATA:  Pt states he has been increasingly short of breath over the past few months. Worse over the past 2 weeks. Was supposed to go to pulmonology and was supposed to have cpap set up for home use but has not done either due to cost.*comment was truncated*SOB for months EXAM: CHEST - 2 VIEW COMPARISON:  9179 FINDINGS: Normal cardiac silhouette. Mild peripheral interstitial pattern. No pleural fluid. No pneumothorax. No focal consolidation. IMPRESSION: Mild interstitial edema pattern.  No pneumonia. Electronically Signed   By: Suzy Bouchard M.D.   On: 09/15/2018 08:27    Microbiology: No results found for this or any previous visit (from the past 240 hour(s)).   Labs: Basic Metabolic Panel: Recent Labs  Lab 09/15/18 0743 09/15/18 1930 09/16/18 0914 09/17/18 0712  NA 134* 135 135 137  K 4.1 4.1 4.6 4.2  CL 104 105 101 102  CO2 25 26 25 29   GLUCOSE 156* 131* 260* 153*  BUN 15 16 20  27*  CREATININE 0.72 0.73 0.86 0.90  CALCIUM 8.5* 8.8* 9.0 8.6*   Liver Function Tests: Recent Labs   Lab 09/15/18 1930  AST 17  ALT 24  ALKPHOS 24*  BILITOT 1.2  PROT 6.8  ALBUMIN 3.9   No results for input(s): LIPASE, AMYLASE in the last 168 hours. No results for input(s): AMMONIA in the last 168 hours. CBC: Recent Labs  Lab 09/15/18 0743 09/15/18 1930 09/16/18 0914  WBC 6.0 6.8 8.1  NEUTROABS 4.3 4.7  --   HGB 14.1 13.9 14.8  HCT 42.0 42.9 44.4  MCV 96.1 96.0 96.9  PLT 162 150 163   Cardiac Enzymes: Recent Labs  Lab 09/15/18 0743 09/15/18 1153 09/15/18 1930  TROPONINI <0.03 <0.03 <0.03   BNP: BNP (last 3 results) Recent Labs    09/15/18 0743 09/16/18 0914  BNP 200.0* 178.0*    ProBNP (last 3 results) No results for input(s): PROBNP in the last 8760 hours.  CBG: Recent Labs  Lab 09/16/18 1627  GLUCAP 189*       Signed:  Irine Seal MD.  [facility] 09/17/2018, 12:20 PM

## 2018-09-17 NOTE — Care Management Note (Signed)
Case Management Note  Patient Details  Name: Thomas Ball MRN: [id number] Date of Birth: [birth year]  Subjective/Objective:      Admitted with resp failure. Pt is from home with wife. He is ind with ADL's. He has PCP, no insurance. Pt is in process of re-enrolling in the [facility] Patient Assistance fund. Pt has had sleep study last year and needs a CPAP. Has been unable to afford one up to this point. Has gotten prices from [employer] and [employer]. CM has gotten prices from [facility] and [facility] this admission.                Action/Plan: DC home today. Pt and wife have decided to pay OOP for CPAP from [place]. CM has routed all necessary clinical information. Pt will go to store today after DC to pick up. Aware they close at 3pm.  Expected Discharge Date:    09/17/18              Expected Discharge Plan:  Home/Self Care  In-House Referral:  NA  Discharge planning Services  CM Consult  Post Acute Care Choice:  Durable Medical Equipment Choice offered to:  Patient, Spouse  DME Arranged:  Continuous positive airway pressure (CPAP) DME Agency:  [facility]  Status of Service:  Completed, signed off  Additional Comments:  Sherald Barge, RN 09/17/2018, 8:40 AM

## 2018-09-26 ENCOUNTER — Ambulatory Visit (INDEPENDENT_AMBULATORY_CARE_PROVIDER_SITE_OTHER): Payer: Self-pay | Admitting: Primary Care

## 2018-09-26 ENCOUNTER — Encounter: Payer: Self-pay | Admitting: Primary Care

## 2018-09-26 VITALS — BP 120/72 | HR 65 | Temp 98.2°F | Ht 65.0 in | Wt 307.0 lb

## 2018-09-26 DIAGNOSIS — G4733 Obstructive sleep apnea (adult) (pediatric): Secondary | ICD-10-CM

## 2018-09-26 DIAGNOSIS — R0609 Other forms of dyspnea: Secondary | ICD-10-CM

## 2018-09-26 MED ORDER — TIOTROPIUM BROMIDE MONOHYDRATE 2.5 MCG/ACT IN AERS: 2.0000 | INHALATION_SPRAY | Freq: Every day | RESPIRATORY_TRACT | 2 refills | Status: DC

## 2018-09-26 NOTE — Progress Notes (Signed)
@Patient  ID: Thomas Ball, male    DOB: 1961/07/23, 58 y.o.   MRN: [id number]  Chief Complaint  Patient presents with  . Follow-up    little better, SOB with exertion, cough with mucus (gets caught in throat), chest tightness and wheezing    Referring provider: [name], DO  HPI: 58 year old male, current every day smoker.  Past medical history significant for obstructive sleep apnea, COPD, liver cirrhosis, GERD, hypertension.  Patient of Dr. Halford Chessman, last seen April 2019.  Patient has severe sleep apnea but has been unable to get CPAP machine due to cost.  Recent admission for shortness of breath.  Hospital course 12/22-12/24: Patient presented with worsening shortness of breath and dyspnea for a couple of weeks with associated episodes of chest pressure lasting for few seconds.  Cardiology felt patient should be admitted for further evaluation. CXR with no active cardiopulmonary disease. EKG showed no ischemic changes, cardiac enzymes negative x3.  Echocardiogram with mild left ventricular hypertrophy, EF 60 to 24%, grade 1 diastolic dysfunction and trivial mitral valve regurgitation.  D-dimer negative. He is given 1 dose of IV Lasix in the ED with reported good urine output.  Shortness of breath felt to be secondary to OSA and inability to afford CPAP, probable emphysema and morbid obesity.  Social work was consulted to help obtain CPAP.   09/27/2018 Presents today for hospital follow-up. Accompanied by his wife. Still has some sob with exertion. Received CPAP machine from apothecary, he has been using mask with no issues. Presumed COPD, started on Incruse during hospital admission. He has not been able to fill prescription d/t cost, reports that he has to pay for all medications out of pocket. Continues Symbicort 2 puffs twice daily. Using Albuterol nebulizer twice a day. Still smoking. Appears somewhat motivated to lose weight and quit smoking.    No Known  Allergies  Immunization History  Administered Date(s) Administered  . Influenza,inj,Quad PF,6+ Mos 07/02/2015, 09/09/2018    Past Medical History:  Diagnosis Date  . GERD (gastroesophageal reflux disease)   . HCV (hepatitis C virus) DR. ZACKS   AUG 2013 VIRAL LOAD UNDETECTABLE  . Hypercholesteremia   . Hypertension   . Pancreatitis, gallstone   . 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 SVT (supraventricular tachycardia) (HCC)     Tobacco History: Social History   Tobacco Use  Smoking Status Current Every Day Smoker  . Packs/day: 1.00  . Years: 35.00  . Pack years: 35.00  . Types: Cigarettes  Smokeless Tobacco Never Used   Ready to quit: Not Answered Counseling given: Not Answered   Outpatient Medications Prior to Visit  Medication Sig Dispense Refill  . aspirin EC 81 MG tablet Take 1 tablet (81 mg total) by mouth daily.    Marland Kitchen atorvastatin (LIPITOR) 40 MG tablet Take 1 tablet (40 mg total) by mouth daily at 6 PM. (Patient taking differently: Take 40 mg by mouth daily. ) 90 tablet 3  . budesonide-formoterol (SYMBICORT) 160-4.5 MCG/ACT inhaler Inhale 2 puffs into the lungs 2 (two) times daily. 1 Inhaler 3  . doxazosin (CARDURA) 1 MG tablet Take 1 tablet (1 mg total) by mouth daily. 90 tablet 2  . omeprazole (PRILOSEC) 40 MG capsule Take 1 capsule (40 mg total) by mouth daily. TAKE ONE (1) CAPSULE EACH DAY 30 capsule 0  . albuterol (PROVENTIL) (2.5 MG/3ML) 0.083% nebulizer solution Take 3 mLs (2.5 mg total) by nebulization every 6 (six) hours as needed  for wheezing or shortness of breath. (Patient not taking: Reported on 09/26/2018) 150 mL 0  . umeclidinium bromide (INCRUSE ELLIPTA) 62.5 MCG/INH AEPB Inhale 1 puff into the lungs daily. (Patient not taking: Reported on 09/26/2018) 30 each 0   No facility-administered medications prior to visit.     Review of Systems  Review of Systems  Constitutional: Negative.   Respiratory: Positive for chest tightness, shortness  of breath and wheezing.      Physical Exam  BP 120/72 (BP Location: Right Arm, Cuff Size: Normal)   Pulse 65   Temp 98.2 F (36.8 C)   Ht 5\' 5"  (1.651 m)   Wt (!) 307 lb (139.3 kg)   SpO2 98%   BMI 51.09 kg/m  Physical Exam Constitutional:      Appearance: Normal appearance. He is obese. He is not ill-appearing.  HENT:     Right Ear: Tympanic membrane normal.     Left Ear: Tympanic membrane normal.     Mouth/Throat:     Mouth: Mucous membranes are moist.     Pharynx: Oropharynx is clear.  Eyes:     Extraocular Movements: Extraocular movements intact.     Pupils: Pupils are equal, round, and reactive to light.  Neck:     Musculoskeletal: Normal range of motion and neck supple.  Cardiovascular:     Rate and Rhythm: Normal rate and regular rhythm.  Pulmonary:     Effort: Pulmonary effort is normal.     Breath sounds: No stridor. No wheezing or rhonchi.     Comments: CTA  Musculoskeletal: Normal range of motion.  Skin:    General: Skin is warm and dry.  Neurological:     General: No focal deficit present.     Mental Status: He is alert and oriented to person, place, and time.  Psychiatric:        Mood and Affect: Mood normal.        Behavior: Behavior normal.        Thought Content: Thought content normal.        Judgment: Judgment normal.      Lab Results:  CBC    Component Value Date/Time   WBC 8.1 09/16/2018 0914   RBC 4.58 09/16/2018 0914   HGB 14.8 09/16/2018 0914   HCT 44.4 09/16/2018 0914   PLT 163 09/16/2018 0914   MCV 96.9 09/16/2018 0914   MCH 32.3 09/16/2018 0914   MCHC 33.3 09/16/2018 0914   RDW 12.6 09/16/2018 0914   LYMPHSABS 1.4 09/15/2018 1930   MONOABS 0.5 09/15/2018 1930   EOSABS 0.1 09/15/2018 1930   BASOSABS 0.0 09/15/2018 1930    BMET    Component Value Date/Time   NA 137 09/17/2018 0712   NA 138 09/09/2018 1010   K 4.2 09/17/2018 0712   CL 102 09/17/2018 0712   CO2 29 09/17/2018 0712   GLUCOSE 153 (H) 09/17/2018 0712    BUN 27 (H) 09/17/2018 0712   BUN 15 09/09/2018 1010   CREATININE 0.90 09/17/2018 0712   CREATININE 1.01 06/11/2015 0801   CALCIUM 8.6 (L) 09/17/2018 0712   GFRNONAA >60 09/17/2018 0712   GFRAA >60 09/17/2018 0712    BNP    Component Value Date/Time   BNP 178.0 (H) 09/16/2018 0914    ProBNP    Component Value Date/Time   PROBNP 314.3 (H) 04/20/2014 2146    Imaging: Dg Chest 2 View  Result Date: 09/15/2018 CLINICAL DATA:  Worsening dyspnea EXAM: CHEST - 2 VIEW  COMPARISON:  09/15/2018, 06/11/2018 FINDINGS: No acute airspace disease or effusion. Stable left upper lung calcified nodule. Borderline cardiomegaly. No pneumothorax. IMPRESSION: No active cardiopulmonary disease. Electronically Signed   By: Donavan Foil M.D.   On: 09/15/2018 19:53   Dg Chest 2 View  Result Date: 09/15/2018 CLINICAL DATA:  Pt states he has been increasingly short of breath over the past few months. Worse over the past 2 weeks. Was supposed to go to pulmonology and was supposed to have cpap set up for home use but has not done either due to cost.*comment was truncated*SOB for months EXAM: CHEST - 2 VIEW COMPARISON:  9179 FINDINGS: Normal cardiac silhouette. Mild peripheral interstitial pattern. No pleural fluid. No pneumothorax. No focal consolidation. IMPRESSION: Mild interstitial edema pattern.  No pneumonia. Electronically Signed   By: Suzy Bouchard M.D.   On: 09/15/2018 08:27     Assessment & Plan:   OSA (obstructive sleep apnea) - New to CPAP, received machine through apothecary  - Needs follow up in 31-90 days   Dyspnea - Needs full PFTS - Presumed COPD  - Continues on Symbicort 160, two puffs twice daily  - Changed Incruse to Spiriva respimat ([facility] paper work filled out)   Martyn Ehrich, NP 09/27/2018

## 2018-09-26 NOTE — Patient Instructions (Addendum)
Look into [facility] app on phone - prescription assistance   Follow-up in 31-90 days with Dr. Halford Chessman for OSA review   Needs PFTs prior to visit   Continue Symbicort 2 puffs twice a day Start Spiriva- 2 puffs once daily (samples given) Stop Incruse- due to cost Albuterol nebulizer every 6 hours as needed for shortness of breath and wheeze (2-4 times a day)

## 2018-09-27 ENCOUNTER — Ambulatory Visit (INDEPENDENT_AMBULATORY_CARE_PROVIDER_SITE_OTHER): Payer: Self-pay | Admitting: Student

## 2018-09-27 ENCOUNTER — Encounter: Payer: Self-pay | Admitting: Student

## 2018-09-27 ENCOUNTER — Encounter: Payer: Self-pay | Admitting: Primary Care

## 2018-09-27 VITALS — BP 106/64 | HR 68 | Ht 65.0 in | Wt 305.0 lb

## 2018-09-27 DIAGNOSIS — E782 Mixed hyperlipidemia: Secondary | ICD-10-CM

## 2018-09-27 DIAGNOSIS — R072 Precordial pain: Secondary | ICD-10-CM

## 2018-09-27 DIAGNOSIS — Z720 Tobacco use: Secondary | ICD-10-CM

## 2018-09-27 DIAGNOSIS — R0609 Other forms of dyspnea: Secondary | ICD-10-CM

## 2018-09-27 DIAGNOSIS — I471 Supraventricular tachycardia: Secondary | ICD-10-CM

## 2018-09-27 DIAGNOSIS — G4733 Obstructive sleep apnea (adult) (pediatric): Secondary | ICD-10-CM

## 2018-09-27 NOTE — Progress Notes (Signed)
Cardiology Office Note    Date:  09/28/2018   ID:  Thomas Ball, DOB Nov 12, 1960, MRN [id number]  PCP:  [name], DO  Cardiologist: Dorris Carnes, MD  (evaluated by Dr. Harrington Challenger in 2015 with no outpatient follow-up; consult by Dr. Domenic Polite in 08/2018 during recent admission)  EP: Dr. Lovena Le  Chief Complaint  Patient presents with  . Hospitalization Follow-up    History of Present Illness:    Thomas Ball is a 58 y.o. male with past medical history of HTN, HLD, pSVT (s/p ablation in 2015), and OSA who presents to the office today for hospital follow-up.  He was recently admitted to [facility] on 09/16/2018 for evaluation of dyspnea on exertion with intermittent episodes of chest discomfort occurring at rest or with activity. Symptoms had been occurring for several years per his report. BNP was mildly elevated at 178 with initial and cyclic troponin values remaining negative. Echocardiogram was obtained for initial assessment which showed an EF of 60 to 65%, no regional wall motion abnormalities, trivial MR, and trivial TR. Given no acute changes, it was recommended that close outpatient follow-up be arranged with consideration of a right and left heart catheterization pending his symptoms.  In talking with the patient today, he denies any recurrent episodes of chest discomfort since hospital discharge but does have continued dyspnea on exertion. He reports being less active over the holidays due to being out of work but is planning to resume his regular work schedule on Monday and says this is typically when he was having symptoms.  He denies any recent orthopnea, PND, or lower extremity edema. He was able to obtain a CPAP last week and has been using this on a nightly basis.  Reports he was previously only sleeping for 30 minutes at a time but this has improved to 1 to 2-hour increments.  Past Medical History:  Diagnosis Date  . GERD (gastroesophageal reflux disease)   . HCV  (hepatitis C virus) DR. ZACKS   AUG 2013 VIRAL LOAD UNDETECTABLE  . Hypercholesteremia   . Hypertension   . Pancreatitis, gallstone   . 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 SVT (supraventricular tachycardia) (HCC)     Past Surgical History:  Procedure Laterality Date  . 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 CHOLECYSTECTOMY    . COLONOSCOPY  10/04/2012   SLF:ONE/8 SIMPLE ADENOMA, TICS, SML IH  . ESOPHAGOGASTRODUODENOSCOPY N/A 09/04/2014   Procedure: ESOPHAGOGASTRODUODENOSCOPY (EGD);  Surgeon: Danie Binder, MD;  Location: [facility];  Service: Endoscopy;  Laterality: N/A;  145  . GALLBLADDER SURGERY    . JAW BROKEN    . LIPOMA REMOVALS     SEVERAL  . SUPRAVENTRICULAR TACHYCARDIA ABLATION N/A 06/22/2014   Procedure: SUPRAVENTRICULAR TACHYCARDIA ABLATION;  Surgeon: Evans Lance, MD;  Location: [facility] CATH LAB;  Service: Cardiovascular;  Laterality: N/A;    Current Medications: Outpatient Medications Prior to Visit  Medication Sig Dispense Refill  . albuterol (PROVENTIL) (2.5 MG/3ML) 0.083% nebulizer solution Take 3 mLs (2.5 mg total) by nebulization every 6 (six) hours as needed for wheezing or shortness of breath. 150 mL 0  . aspirin EC 81 MG tablet Take 1 tablet (81 mg total) by mouth daily.    Marland Kitchen atorvastatin (LIPITOR) 40 MG tablet Take 1 tablet (40 mg total) by mouth daily at 6 PM. (Patient taking differently: Take 40 mg by mouth daily. ) 90 tablet 3  .  budesonide-formoterol (SYMBICORT) 160-4.5 MCG/ACT inhaler Inhale 2 puffs into the lungs 2 (two) times daily. 1 Inhaler 3  . doxazosin (CARDURA) 1 MG tablet Take 1 tablet (1 mg total) by mouth daily. 90 tablet 2  . omeprazole (PRILOSEC) 40 MG capsule Take 1 capsule (40 mg total) by mouth daily. TAKE ONE (1) CAPSULE EACH DAY 30 capsule 0  . Tiotropium Bromide Monohydrate (SPIRIVA RESPIMAT) 2.5 MCG/ACT AERS Inhale 2 puffs into the lungs daily. 4 g 2   No facility-administered medications prior to visit.        Allergies:   Patient has no known allergies.   Social History   Socioeconomic History  . Marital status: Married    Spouse name: Not on file  . Number of children: Not on file  . Years of education: Not on file  . Highest education level: Not on file  Occupational History  . Occupation: concrete work    Fish farm manager: SELF-EMPLOYED  Social Needs  . Financial resource strain: Not on file  . Food insecurity:    Worry: Not on file    Inability: Not on file  . Transportation needs:    Medical: Not on file    Non-medical: Not on file  Tobacco Use  . Smoking status: Current Every Day Smoker    Packs/day: 1.00    Years: 35.00    Pack years: 35.00    Types: Cigarettes  . Smokeless tobacco: Never Used  Substance and Sexual Activity  . Alcohol use: No  . Drug use: No    Comment: in the 1980s, smoke crack. None since Dec 2011.   Marland Kitchen Sexual activity: Not on file  Lifestyle  . Physical activity:    Days per week: Not on file    Minutes per session: Not on file  . Stress: Not on file  Relationships  . Social connections:    Talks on phone: Not on file    Gets together: Not on file    Attends religious service: Not on file    Active member of club or organization: Not on file    Attends meetings of clubs or organizations: Not on file    Relationship status: Not on file  Other Topics Concern  . Not on file  Social History Narrative  . Not on file     Family History:  The patient's family history includes COPD in his mother; Diabetes in his father; Early death in his mother; Emphysema in his mother; High Cholesterol in his father; Hyperlipidemia in his father; Stroke in his father.   Review of Systems:   Please see the history of present illness.     General:  No chills, fever, night sweats or weight changes.  Cardiovascular:  No edema, orthopnea, palpitations, paroxysmal nocturnal dyspnea. Positive for chest pain and dyspnea on exertion.  Dermatological: No rash,  lesions/masses Respiratory: No cough, dyspnea Urologic: No hematuria, dysuria Abdominal:   No nausea, vomiting, diarrhea, bright red blood per rectum, melena, or hematemesis Neurologic:  No visual changes, wkns, changes in mental status. All other systems reviewed and are otherwise negative except as noted above.   Physical Exam:    VS:  BP 106/64   Pulse 68   Ht 5\' 5"  (1.651 m)   Wt (!) 305 lb (138.3 kg)   SpO2 97%   BMI 50.75 kg/m    General: Well developed, obese Caucasian male appearing in no acute distress. Head: Normocephalic, atraumatic, sclera non-icteric, no xanthomas, nares are without discharge.  Neck: No carotid bruits. JVD not elevated.  Lungs: Respirations regular and unlabored, without wheezes or rales.  Heart: Regular rate and rhythm. No S3 or S4.  No murmur, no rubs, or gallops appreciated. Abdomen: Soft, non-tender, non-distended with normoactive bowel sounds. No hepatomegaly. No rebound/guarding. No obvious abdominal masses. Msk:  Strength and tone appear normal for age. No joint deformities or effusions. Extremities: No clubbing or cyanosis. Trace lower extremity edema.  Distal pedal pulses are 2+ bilaterally. Neuro: Alert and oriented X 3. Moves all extremities spontaneously. No focal deficits noted. Psych:  Responds to questions appropriately with a normal affect. Skin: No rashes or lesions noted  Wt Readings from Last 3 Encounters:  09/27/18 (!) 305 lb (138.3 kg)  09/26/18 (!) 307 lb (139.3 kg)  09/17/18 (!) 300 lb 4.3 oz (136.2 kg)     Studies/Labs Reviewed:   EKG:  EKG is not ordered today. EKG from 09/15/2018 is reviewed which shows NSR, HR 60, with no acute ST changes when compared to prior tracings.   Recent Labs: 09/09/2018: TSH 1.420 09/15/2018: ALT 24 09/16/2018: B Natriuretic Peptide 178.0; Hemoglobin 14.8; Platelets 163 09/17/2018: BUN 27; Creatinine, Ser 0.90; Potassium 4.2; Sodium 137   Lipid Panel    Component Value Date/Time   CHOL  116 09/09/2018 1010   TRIG 132 09/09/2018 1010   HDL 38 (L) 09/09/2018 1010   CHOLHDL 3.1 09/09/2018 1010   CHOLHDL 5.4 09/06/2017 1321   VLDL 24 09/06/2017 1321   LDLCALC 52 09/09/2018 1010    Additional studies/ records that were reviewed today include:   NST: 07/2017  There was no ST segment deviation noted during stress.  The study is normal. No ischemia or scar.  This is a low risk study.  Nuclear stress EF: 53%.  Echocardiogram: 08/2018 Study Conclusions  - Left ventricle: The cavity size was normal. Wall thickness was   increased in a pattern of mild LVH. Systolic function was normal.   The estimated ejection fraction was in the range of 60% to 65%.   Wall motion was normal; there were no regional wall motion   abnormalities. Doppler parameters are consistent with abnormal   left ventricular relaxation (grade 1 diastolic dysfunction). - Aortic valve: Mildly calcified annulus. Trileaflet. - Mitral valve: There was trivial regurgitation. - Left atrium: The atrium was mildly dilated. - Right atrium: Central venous pressure (est): 3 mm Hg. - Tricuspid valve: There was trivial regurgitation. - Pulmonary arteries: Systolic pressure could not be accurately   estimated. - Pericardium, extracardiac: There was no pericardial effusion.   Assessment:    1. Dyspnea on exertion   2. Precordial chest pain   3. PSVT (paroxysmal supraventricular tachycardia) ([facility])   4. Mixed hyperlipidemia   5. OSA (obstructive sleep apnea)   6. Tobacco use      Plan:   In order of problems listed above:  1. Dyspnea on Exertion/ Precordial Chest Pain - per the patient's report his dyspnea on exertion has been occurring for the past several years but acutely worsened a few months back. Reports intermittent chest discomfort as well which can occur at rest or with activity. He was out of work for the holidays and plans to return on Monday. Says he has not experienced significant symptoms  since hospital discharge but has also not exerted himself. He plans to return to work on Monday and if symptoms present, he will call back to schedule [facility] as recommended during his previous admission. He also started CPAP last  week, so he wants to see if this has significantly helped with his respiratory status. We did review risks and benefits of a catheterization along with the procedure. The patient understands that risks include but are not limited to stroke (1 in 1000), death (1 in 13), kidney failure [usually temporary] (1 in 500), bleeding (1 in 200), allergic reaction [possibly serious] (1 in 200). He will need routine labs prior to his procedure. Reviewed with DOD (Dr. Harrington Challenger) who was in agreement to proceed with cath if symptoms persist but she also recommended making Dr. Domenic Polite aware as well given he most recently evaluated the patient during his admission in 08/2018. - continue ASA and statin therapy.   2. pSVT - s/p ablation in 2015. He denies any recent palpitations and maintained NSR during his recent admission.   3. HLD - followed by PCP. Flp in 08/2018 showed total cholesterol of 116, HDL 38, and LDL 52. Remains on Atorvastatin 40mg  daily.   4. OSA - he was recently able to obtain a CPAP machine and has noticed improvement in his sleep habits and daytime somnolence. Continued compliance with CPAP encouraged.   5. Tobacco Use - he was previously smoking 1 ppd but has reduced his use to 2-3 packs per week. Congratulated on his reduction with cessation advised.    Medication Adjustments/Labs and Tests Ordered: Current medicines are reviewed at length with the patient today.  Concerns regarding medicines are outlined above.  Medication changes, Labs and Tests ordered today are listed in the Patient Instructions below. Patient Instructions  Medication Instructions:  Your physician recommends that you continue on your current medications as directed. Please refer to the Current  Medication list given to you today.  If you need a refill on your cardiac medications before your next appointment, please call your pharmacy.   Lab work: NONE  If you have labs (blood work) drawn today and your tests are completely normal, you will receive your results only by: Marland Kitchen MyChart Message (if you have MyChart) OR . A paper copy in the mail If you have any lab test that is abnormal or we need to change your treatment, we will call you to review the results.  Testing/Procedures: NONE   Follow-Up: At [facility], you and your health needs are our priority.  As part of our continuing mission to provide you with exceptional heart care, we have created designated Provider Care Teams.  These Care Teams include your primary Cardiologist (physician) and Advanced Practice Providers (APPs -  Physician Assistants and Nurse Practitioners) who all work together to provide you with the care you need, when you need it. You will need a follow up appointment in 3 months.  Please call our office 2 months in advance to schedule this appointment.  You may see Dorris Carnes, MD or one of the following Advanced Practice Providers on your designated Care Team:   Bernerd Pho, PA-C [facility]) . Ermalinda Barrios, PA-C ([place])  Any Other Special Instructions Will Be Listed Below (If Applicable).  Please call our office by Wednesday regarding having heart Cath.   Thank you for choosing [facility]!    Signed, Erma Heritage, PA-C  09/28/2018 9:57 AM    [address] [place], [place] [address] Phone: [phone]

## 2018-09-27 NOTE — Assessment & Plan Note (Signed)
-   New to CPAP, received machine through apothecary  - Needs follow up in 31-90 days

## 2018-09-27 NOTE — H&P (View-Only) (Signed)
Cardiology Office Note    Date:  09/28/2018   ID:  Thomas Ball, DOB Jun 03, 1961, MRN [id number]  PCP:  [name], DO  Cardiologist: Dorris Carnes, MD  (evaluated by Dr. Harrington Challenger in 2015 with no outpatient follow-up; consult by Dr. Domenic Polite in 08/2018 during recent admission)  EP: Dr. Lovena Le  Chief Complaint  Patient presents with  . Hospitalization Follow-up    History of Present Illness:    Thomas Ball is a 58 y.o. male with past medical history of HTN, HLD, pSVT (s/p ablation in 2015), and OSA who presents to the office today for hospital follow-up.  He was recently admitted to [facility] on 09/16/2018 for evaluation of dyspnea on exertion with intermittent episodes of chest discomfort occurring at rest or with activity. Symptoms had been occurring for several years per his report. BNP was mildly elevated at 178 with initial and cyclic troponin values remaining negative. Echocardiogram was obtained for initial assessment which showed an EF of 60 to 65%, no regional wall motion abnormalities, trivial MR, and trivial TR. Given no acute changes, it was recommended that close outpatient follow-up be arranged with consideration of a right and left heart catheterization pending his symptoms.  In talking with the patient today, he denies any recurrent episodes of chest discomfort since hospital discharge but does have continued dyspnea on exertion. He reports being less active over the holidays due to being out of work but is planning to resume his regular work schedule on Monday and says this is typically when he was having symptoms.  He denies any recent orthopnea, PND, or lower extremity edema. He was able to obtain a CPAP last week and has been using this on a nightly basis.  Reports he was previously only sleeping for 30 minutes at a time but this has improved to 1 to 2-hour increments.  Past Medical History:  Diagnosis Date  . GERD (gastroesophageal reflux disease)   . HCV  (hepatitis C virus) DR. ZACKS   AUG 2013 VIRAL LOAD UNDETECTABLE  . Hypercholesteremia   . Hypertension   . Pancreatitis, gallstone   . 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 SVT (supraventricular tachycardia) (HCC)     Past Surgical History:  Procedure Laterality Date  . 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 CHOLECYSTECTOMY    . COLONOSCOPY  10/04/2012   SLF:ONE/8 SIMPLE ADENOMA, TICS, SML IH  . ESOPHAGOGASTRODUODENOSCOPY N/A 09/04/2014   Procedure: ESOPHAGOGASTRODUODENOSCOPY (EGD);  Surgeon: Danie Binder, MD;  Location: [facility];  Service: Endoscopy;  Laterality: N/A;  145  . GALLBLADDER SURGERY    . JAW BROKEN    . LIPOMA REMOVALS     SEVERAL  . SUPRAVENTRICULAR TACHYCARDIA ABLATION N/A 06/22/2014   Procedure: SUPRAVENTRICULAR TACHYCARDIA ABLATION;  Surgeon: Evans Lance, MD;  Location: [facility] CATH LAB;  Service: Cardiovascular;  Laterality: N/A;    Current Medications: Outpatient Medications Prior to Visit  Medication Sig Dispense Refill  . albuterol (PROVENTIL) (2.5 MG/3ML) 0.083% nebulizer solution Take 3 mLs (2.5 mg total) by nebulization every 6 (six) hours as needed for wheezing or shortness of breath. 150 mL 0  . aspirin EC 81 MG tablet Take 1 tablet (81 mg total) by mouth daily.    Marland Kitchen atorvastatin (LIPITOR) 40 MG tablet Take 1 tablet (40 mg total) by mouth daily at 6 PM. (Patient taking differently: Take 40 mg by mouth daily. ) 90 tablet 3  .  budesonide-formoterol (SYMBICORT) 160-4.5 MCG/ACT inhaler Inhale 2 puffs into the lungs 2 (two) times daily. 1 Inhaler 3  . doxazosin (CARDURA) 1 MG tablet Take 1 tablet (1 mg total) by mouth daily. 90 tablet 2  . omeprazole (PRILOSEC) 40 MG capsule Take 1 capsule (40 mg total) by mouth daily. TAKE ONE (1) CAPSULE EACH DAY 30 capsule 0  . Tiotropium Bromide Monohydrate (SPIRIVA RESPIMAT) 2.5 MCG/ACT AERS Inhale 2 puffs into the lungs daily. 4 g 2   No facility-administered medications prior to visit.        Allergies:   Patient has no known allergies.   Social History   Socioeconomic History  . Marital status: Married    Spouse name: Not on file  . Number of children: Not on file  . Years of education: Not on file  . Highest education level: Not on file  Occupational History  . Occupation: concrete work    Fish farm manager: SELF-EMPLOYED  Social Needs  . Financial resource strain: Not on file  . Food insecurity:    Worry: Not on file    Inability: Not on file  . Transportation needs:    Medical: Not on file    Non-medical: Not on file  Tobacco Use  . Smoking status: Current Every Day Smoker    Packs/day: 1.00    Years: 35.00    Pack years: 35.00    Types: Cigarettes  . Smokeless tobacco: Never Used  Substance and Sexual Activity  . Alcohol use: No  . Drug use: No    Comment: in the 1980s, smoke crack. None since Dec 2011.   Marland Kitchen Sexual activity: Not on file  Lifestyle  . Physical activity:    Days per week: Not on file    Minutes per session: Not on file  . Stress: Not on file  Relationships  . Social connections:    Talks on phone: Not on file    Gets together: Not on file    Attends religious service: Not on file    Active member of club or organization: Not on file    Attends meetings of clubs or organizations: Not on file    Relationship status: Not on file  Other Topics Concern  . Not on file  Social History Narrative  . Not on file     Family History:  The patient's family history includes COPD in his mother; Diabetes in his father; Early death in his mother; Emphysema in his mother; High Cholesterol in his father; Hyperlipidemia in his father; Stroke in his father.   Review of Systems:   Please see the history of present illness.     General:  No chills, fever, night sweats or weight changes.  Cardiovascular:  No edema, orthopnea, palpitations, paroxysmal nocturnal dyspnea. Positive for chest pain and dyspnea on exertion.  Dermatological: No rash,  lesions/masses Respiratory: No cough, dyspnea Urologic: No hematuria, dysuria Abdominal:   No nausea, vomiting, diarrhea, bright red blood per rectum, melena, or hematemesis Neurologic:  No visual changes, wkns, changes in mental status. All other systems reviewed and are otherwise negative except as noted above.   Physical Exam:    VS:  BP 106/64   Pulse 68   Ht 5\' 5"  (1.651 m)   Wt (!) 305 lb (138.3 kg)   SpO2 97%   BMI 50.75 kg/m    General: Well developed, obese Caucasian male appearing in no acute distress. Head: Normocephalic, atraumatic, sclera non-icteric, no xanthomas, nares are without discharge.  Neck: No carotid bruits. JVD not elevated.  Lungs: Respirations regular and unlabored, without wheezes or rales.  Heart: Regular rate and rhythm. No S3 or S4.  No murmur, no rubs, or gallops appreciated. Abdomen: Soft, non-tender, non-distended with normoactive bowel sounds. No hepatomegaly. No rebound/guarding. No obvious abdominal masses. Msk:  Strength and tone appear normal for age. No joint deformities or effusions. Extremities: No clubbing or cyanosis. Trace lower extremity edema.  Distal pedal pulses are 2+ bilaterally. Neuro: Alert and oriented X 3. Moves all extremities spontaneously. No focal deficits noted. Psych:  Responds to questions appropriately with a normal affect. Skin: No rashes or lesions noted  Wt Readings from Last 3 Encounters:  09/27/18 (!) 305 lb (138.3 kg)  09/26/18 (!) 307 lb (139.3 kg)  09/17/18 (!) 300 lb 4.3 oz (136.2 kg)     Studies/Labs Reviewed:   EKG:  EKG is not ordered today. EKG from 09/15/2018 is reviewed which shows NSR, HR 60, with no acute ST changes when compared to prior tracings.   Recent Labs: 09/09/2018: TSH 1.420 09/15/2018: ALT 24 09/16/2018: B Natriuretic Peptide 178.0; Hemoglobin 14.8; Platelets 163 09/17/2018: BUN 27; Creatinine, Ser 0.90; Potassium 4.2; Sodium 137   Lipid Panel    Component Value Date/Time   CHOL  116 09/09/2018 1010   TRIG 132 09/09/2018 1010   HDL 38 (L) 09/09/2018 1010   CHOLHDL 3.1 09/09/2018 1010   CHOLHDL 5.4 09/06/2017 1321   VLDL 24 09/06/2017 1321   LDLCALC 52 09/09/2018 1010    Additional studies/ records that were reviewed today include:   NST: 07/2017  There was no ST segment deviation noted during stress.  The study is normal. No ischemia or scar.  This is a low risk study.  Nuclear stress EF: 53%.  Echocardiogram: 08/2018 Study Conclusions  - Left ventricle: The cavity size was normal. Wall thickness was   increased in a pattern of mild LVH. Systolic function was normal.   The estimated ejection fraction was in the range of 60% to 65%.   Wall motion was normal; there were no regional wall motion   abnormalities. Doppler parameters are consistent with abnormal   left ventricular relaxation (grade 1 diastolic dysfunction). - Aortic valve: Mildly calcified annulus. Trileaflet. - Mitral valve: There was trivial regurgitation. - Left atrium: The atrium was mildly dilated. - Right atrium: Central venous pressure (est): 3 mm Hg. - Tricuspid valve: There was trivial regurgitation. - Pulmonary arteries: Systolic pressure could not be accurately   estimated. - Pericardium, extracardiac: There was no pericardial effusion.   Assessment:    1. Dyspnea on exertion   2. Precordial chest pain   3. PSVT (paroxysmal supraventricular tachycardia) ([place])   4. Mixed hyperlipidemia   5. OSA (obstructive sleep apnea)   6. Tobacco use      Plan:   In order of problems listed above:  1. Dyspnea on Exertion/ Precordial Chest Pain - per the patient's report his dyspnea on exertion has been occurring for the past several years but acutely worsened a few months back. Reports intermittent chest discomfort as well which can occur at rest or with activity. He was out of work for the holidays and plans to return on Monday. Says he has not experienced significant symptoms  since hospital discharge but has also not exerted himself. He plans to return to work on Monday and if symptoms present, he will call back to schedule [facility] as recommended during his previous admission. He also started CPAP last  week, so he wants to see if this has significantly helped with his respiratory status. We did review risks and benefits of a catheterization along with the procedure. The patient understands that risks include but are not limited to stroke (1 in 1000), death (1 in 51), kidney failure [usually temporary] (1 in 500), bleeding (1 in 200), allergic reaction [possibly serious] (1 in 200). He will need routine labs prior to his procedure. Reviewed with DOD (Dr. Harrington Challenger) who was in agreement to proceed with cath if symptoms persist but she also recommended making Dr. Domenic Polite aware as well given he most recently evaluated the patient during his admission in 08/2018. - continue ASA and statin therapy.   2. pSVT - s/p ablation in 2015. He denies any recent palpitations and maintained NSR during his recent admission.   3. HLD - followed by PCP. Flp in 08/2018 showed total cholesterol of 116, HDL 38, and LDL 52. Remains on Atorvastatin 40mg  daily.   4. OSA - he was recently able to obtain a CPAP machine and has noticed improvement in his sleep habits and daytime somnolence. Continued compliance with CPAP encouraged.   5. Tobacco Use - he was previously smoking 1 ppd but has reduced his use to 2-3 packs per week. Congratulated on his reduction with cessation advised.    Medication Adjustments/Labs and Tests Ordered: Current medicines are reviewed at length with the patient today.  Concerns regarding medicines are outlined above.  Medication changes, Labs and Tests ordered today are listed in the Patient Instructions below. Patient Instructions  Medication Instructions:  Your physician recommends that you continue on your current medications as directed. Please refer to the Current  Medication list given to you today.  If you need a refill on your cardiac medications before your next appointment, please call your pharmacy.   Lab work: NONE  If you have labs (blood work) drawn today and your tests are completely normal, you will receive your results only by: Marland Kitchen MyChart Message (if you have MyChart) OR . A paper copy in the mail If you have any lab test that is abnormal or we need to change your treatment, we will call you to review the results.  Testing/Procedures: NONE   Follow-Up: At [facility], you and your health needs are our priority.  As part of our continuing mission to provide you with exceptional heart care, we have created designated Provider Care Teams.  These Care Teams include your primary Cardiologist (physician) and Advanced Practice Providers (APPs -  Physician Assistants and Nurse Practitioners) who all work together to provide you with the care you need, when you need it. You will need a follow up appointment in 3 months.  Please call our office 2 months in advance to schedule this appointment.  You may see Dorris Carnes, MD or one of the following Advanced Practice Providers on your designated Care Team:   Bernerd Pho, PA-C [facility]) . Ermalinda Barrios, PA-C ([place])  Any Other Special Instructions Will Be Listed Below (If Applicable).  Please call our office by Wednesday regarding having heart Cath.   Thank you for choosing [facility]!    Signed, Erma Heritage, PA-C  09/28/2018 9:57 AM    [place] [address] [place], [place] [address] Phone: [phone]

## 2018-09-27 NOTE — Assessment & Plan Note (Addendum)
-   Needs full PFTS - Presumed COPD  - Continues on Symbicort 160, two puffs twice daily  - Changed Incruse to Spiriva respimat (BI paper work filled out)

## 2018-09-27 NOTE — Progress Notes (Signed)
Reviewed and agree with assessment/plan.   Vannesa Abair, MD [facility] 09/20/2016, 12:24 PM Pager:  [phone]  

## 2018-09-27 NOTE — Patient Instructions (Addendum)
Medication Instructions:  Your physician recommends that you continue on your current medications as directed. Please refer to the Current Medication list given to you today.  If you need a refill on your cardiac medications before your next appointment, please call your pharmacy.   Lab work: NONE  If you have labs (blood work) drawn today and your tests are completely normal, you will receive your results only by: Marland Kitchen MyChart Message (if you have MyChart) OR . A paper copy in the mail If you have any lab test that is abnormal or we need to change your treatment, we will call you to review the results.  Testing/Procedures: NONE   Follow-Up: At [facility], you and your health needs are our priority.  As part of our continuing mission to provide you with exceptional heart care, we have created designated Provider Care Teams.  These Care Teams include your primary Cardiologist (physician) and Advanced Practice Providers (APPs -  Physician Assistants and Nurse Practitioners) who all work together to provide you with the care you need, when you need it. You will need a follow up appointment in 3 months.  Please call our office 2 months in advance to schedule this appointment.  You may see Dorris Carnes, MD or one of the following Advanced Practice Providers on your designated Care Team:   Bernerd Pho, PA-C [facility]) . Ermalinda Barrios, PA-C ([place])  Any Other Special Instructions Will Be Listed Below (If Applicable).  Please call our office by Wednesday regarding having heart Cath.   Thank you for choosing [facility]!     Coronary Angiogram With Stent Coronary angiogram with stent placement is a procedure to widen or open a narrow blood vessel of the heart (coronary artery). Arteries may become blocked by cholesterol buildup (plaques) in the lining of the wall. When a coronary artery becomes partially blocked, blood flow to that area decreases. This may  lead to chest pain or a heart attack (myocardial infarction). A stent is a small piece of metal that looks like mesh or a spring. Stent placement may be done as treatment for a heart attack or right after a coronary angiogram in which a blocked artery is found. Let your health care provider know about:  Any allergies you have.  All medicines you are taking, including vitamins, herbs, eye drops, creams, and over-the-counter medicines.  Any problems you or family members have had with anesthetic medicines.  Any blood disorders you have.  Any surgeries you have had.  Any medical conditions you have.  Whether you are pregnant or may be pregnant. What are the risks? Generally, this is a safe procedure. However, problems may occur, including:  Damage to the heart or its blood vessels.  A return of blockage.  Bleeding, infection, or bruising at the insertion site.  A collection of blood under the skin (hematoma) at the insertion site.  A blood clot in another part of the body.  Kidney injury.  Allergic reaction to the dye or contrast that is used.  Bleeding into the abdomen (retroperitoneal bleeding). What happens during the procedure?   An IV tube will be inserted into one of your veins.  You will be given one or more of the following: ? A medicine to help you relax (sedative). ? A medicine to numb the area where the catheter will be inserted into an artery (local anesthetic).  To reduce your risk of infection: ? Your health care team will wash or sanitize their  hands. ? Your skin will be washed with soap. ? Hair may be removed from the area where the catheter will be inserted.  Using a guide wire, the catheter will be inserted into an artery. The location may be in your groin, in your wrist, or in the fold of your arm (near your elbow).  A type of X-ray (fluoroscopy) will be used to help guide the catheter to the opening of the arteries in the heart.  A dye will be  injected into the catheter, and X-rays will be taken. The dye will help to show where any narrowing or blockages are located in the arteries.  A tiny wire will be guided to the blocked spot, and a balloon will be inflated to make the artery wider.  The stent will be expanded and will crush the plaques into the wall of the vessel. The stent will hold the area open and improve the blood flow. Most stents have a drug coating to reduce the risk of the stent narrowing over time.  The artery may be made wider using a drill, laser, or other tools to remove plaques.  When the blood flow is better, the catheter will be removed. The lining of the artery will grow over the stent, which stays where it was placed. This procedure may vary among health care providers and hospitals. What happens after the procedure?  If the procedure is done through the leg, you will be kept in bed lying flat for about 6 hours. You will be instructed to not bend and not cross your legs.  The insertion site will be checked frequently.  The pulse in your foot or wrist will be checked frequently.  You may have additional blood tests, X-rays, and a test that records the electrical activity of your heart (electrocardiogram, or ECG). This information is not intended to replace advice given to you by your health care provider. Make sure you discuss any questions you have with your health care provider. Document Released: 03/18/2003 Document Revised: 12/21/2017 Document Reviewed: 04/16/2016 Elsevier Interactive Patient Education  2019 [employer].

## 2018-09-28 ENCOUNTER — Encounter: Payer: Self-pay | Admitting: Student

## 2018-09-30 ENCOUNTER — Telehealth: Payer: Self-pay | Admitting: Student

## 2018-09-30 NOTE — Telephone Encounter (Signed)
Please give pt's wife Arbie Cookey a call to schedule pt for his cath.  [phone]

## 2018-10-01 ENCOUNTER — Encounter: Payer: Self-pay | Admitting: *Deleted

## 2018-10-01 NOTE — Telephone Encounter (Signed)
Pt notified of cath instructions, date and time.

## 2018-10-01 NOTE — Telephone Encounter (Signed)
Pt scheduled for cath Friday, Jan. 10 @ 0730 with Dr. [place]. Called to notify pt., no answer left msg to call back.

## 2018-10-03 ENCOUNTER — Telehealth: Payer: Self-pay | Admitting: *Deleted

## 2018-10-03 NOTE — Telephone Encounter (Signed)
Pt contacted pre-catheterization scheduled at [facility] for: Friday October 04, 2018 7:30 AM Verified arrival time and place: [place] Entrance A at: 5:30 AM  No solid food after midnight prior to cath, clear liquids until 5 AM day of procedure. Contrast allergy: no Verified no diabetes medications.  AM meds can be  taken pre-cath with sip of water including: ASA 81 mg  Confirmed patient has responsible person to drive home post procedure and for 24 hours after you arrive home: yes

## 2018-10-04 ENCOUNTER — Emergency Department (HOSPITAL_COMMUNITY)
Admission: RE | Admit: 2018-10-04 | Discharge: 2018-10-04 | Disposition: A | Discharge status: Home / Self Care | Payer: Self-pay | Attending: Emergency Medicine | Admitting: Emergency Medicine

## 2018-10-04 ENCOUNTER — Emergency Department (HOSPITAL_COMMUNITY): Payer: Self-pay

## 2018-10-04 ENCOUNTER — Telehealth: Payer: Self-pay | Admitting: Internal Medicine

## 2018-10-04 ENCOUNTER — Encounter (HOSPITAL_COMMUNITY)
Admission: RE | Disposition: A | Discharge status: Home / Self Care | Payer: Self-pay | Source: Home / Self Care | Attending: Emergency Medicine

## 2018-10-04 ENCOUNTER — Encounter (HOSPITAL_COMMUNITY): Payer: Self-pay | Admitting: Emergency Medicine

## 2018-10-04 ENCOUNTER — Other Ambulatory Visit: Payer: Self-pay

## 2018-10-04 DIAGNOSIS — Z85828 Personal history of other malignant neoplasm of skin: Secondary | ICD-10-CM | POA: Insufficient documentation

## 2018-10-04 DIAGNOSIS — W1830XA Fall on same level, unspecified, initial encounter: Secondary | ICD-10-CM | POA: Insufficient documentation

## 2018-10-04 DIAGNOSIS — S96911A Strain of unspecified muscle and tendon at ankle and foot level, right foot, initial encounter: Secondary | ICD-10-CM

## 2018-10-04 DIAGNOSIS — Y92524 Gas station as the place of occurrence of the external cause: Secondary | ICD-10-CM | POA: Insufficient documentation

## 2018-10-04 DIAGNOSIS — Y939 Activity, unspecified: Secondary | ICD-10-CM | POA: Insufficient documentation

## 2018-10-04 DIAGNOSIS — Y999 Unspecified external cause status: Secondary | ICD-10-CM | POA: Insufficient documentation

## 2018-10-04 DIAGNOSIS — S76012A Strain of muscle, fascia and tendon of left hip, initial encounter: Secondary | ICD-10-CM

## 2018-10-04 DIAGNOSIS — Z79899 Other long term (current) drug therapy: Secondary | ICD-10-CM | POA: Insufficient documentation

## 2018-10-04 DIAGNOSIS — F17210 Nicotine dependence, cigarettes, uncomplicated: Secondary | ICD-10-CM | POA: Insufficient documentation

## 2018-10-04 DIAGNOSIS — I10 Essential (primary) hypertension: Secondary | ICD-10-CM | POA: Insufficient documentation

## 2018-10-04 SURGERY — RIGHT/LEFT HEART CATH AND CORONARY ANGIOGRAPHY
Anesthesia: LOCAL

## 2018-10-04 MED ORDER — MORPHINE SULFATE (PF) 4 MG/ML IV SOLN
4.0000 mg | Freq: Once | INTRAVENOUS | Status: AC
  Administered 2018-10-04: 4 mg via INTRAVENOUS
  Filled 2018-10-04: qty 1

## 2018-10-04 MED ORDER — CYCLOBENZAPRINE HCL 10 MG PO TABS: 10.0000 mg | ORAL_TABLET | Freq: Two times a day (BID) | ORAL | 0 refills | Status: DC | PRN

## 2018-10-04 MED ORDER — ACETAMINOPHEN 500 MG PO TABS: 500.0000 mg | ORAL_TABLET | Freq: Four times a day (QID) | ORAL | 0 refills | Status: DC | PRN

## 2018-10-04 NOTE — Telephone Encounter (Signed)
Patient states that he was scheduled for cath this AM but upon arrival was sent to ER. States that he was told to call office to reschedule cath./ tg

## 2018-10-04 NOTE — Telephone Encounter (Signed)
Rescheduled cath for Tuesday 10/08/2018 with Dr.Jordan, arrive at 7 am for 9 am cath    All cath instructions remain the same, pt aware    Patient was told by ED he had only strained muscles.Patient wants cath early next week.

## 2018-10-04 NOTE — ED Notes (Signed)
IV has been taken out 

## 2018-10-04 NOTE — Progress Notes (Signed)
Patient came to short stay, states he had a fall last nigh at the gas station.  He states he tripped over the hose and fell on his abdomen.  Patient states at that time only his his right ankle hurt.  Later in the night he work up and he had trouble getting up.  He states both legs hurt, his groin hurts.    He is scheduled for outpatient cath.  Notified Kathyrn Drown, NP of the fall.  Decision made to send patient to ER to be evaluated.  Called charge nurse to inform of the situation.  She states he can come to room 23 in ER.  Will follow up later this am.

## 2018-10-04 NOTE — ED Notes (Signed)
multiple attempts to call cath lab made by this RN and PA; no response; pt states that he does not want procedure today, but will follow up to reschedule

## 2018-10-04 NOTE — ED Notes (Signed)
Patient verbalizes understanding of discharge instructions. Opportunity for questioning and answers were provided. Pt discharged from ED. 

## 2018-10-04 NOTE — ED Provider Notes (Signed)
[place] EMERGENCY DEPARTMENT Provider Note   CSN: [id number] Arrival date & time: 10/04/18  0620     History   Chief Complaint Chief Complaint  Patient presents with  . Groin Pain  . Foot Pain    HPI Thomas Ball is a 58 y.o. male.  The history is provided by the patient and medical records. No language interpreter was used.  Groin Pain   Foot Pain      58 year old male with history of hypertension, hyperlipidemia, PSVT status post ablation in 2015, OSA presenting for evaluation of a recent fall.  Patient report yesterday he was pumping gas, he excellently tripped over the gas hose, fell forward and injured himself.  States he did not hit his head or have any loss of consciousness.  Denies any precipitating symptoms prior to the fall.  States that he did not feel any significant pain initially and was able to continue throughout the day.  He did have some right foot pain initially but this morning he woke up with quite a bit of pain primarily to his left groin and right foot.  Pain is sharp shooting radiates towards the knee worsening with movement.  He was able to ambulate afterward.  He denies any specific treatment tried.  He has a heart catheterization scheduled today for further work-up of his recurrent shortness of breath.  However, while at the Cath Lab, he mention about his pain and patient subsequently sent to the ED for further evaluation of his injury.  He did not have his procedure.  Patient currently denies any bowel bladder incontinence or saddle anesthesia.  Denies any dysuria denies any focal numbness.    Past Medical History:  Diagnosis Date  . GERD (gastroesophageal reflux disease)   . HCV (hepatitis C virus) DR. ZACKS   AUG 2013 VIRAL LOAD UNDETECTABLE  . Hypercholesteremia   . Hypertension   . Pancreatitis, gallstone   . 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 SVT (supraventricular tachycardia) [facility])     Patient Active  Problem List   Diagnosis Date Noted  . Dyspnea   . SOB (shortness of breath) 09/15/2018  . Essential hypertension 04/26/2018  . OSA (obstructive sleep apnea) 03/04/2018  . BPH (benign prostatic hyperplasia) 10/07/2015  . EIC (epidermal inclusion cyst) 07/23/2015  . Tobacco abuse 07/02/2015  . SVT (supraventricular tachycardia) ([place])   . Intermittent palpitations 04/21/2014  . Bradycardia 04/21/2014  . Morbid obesity ([place]) 04/21/2014  . Chest pain 04/20/2014  . Cirrhosis of liver ([place]) 11/05/2013  . GERD (gastroesophageal reflux disease) 07/18/2012  . Screening for colorectal cancer 07/18/2012  . Pulmonary nodule 02/14/2012    Past Surgical History:  Procedure Laterality Date  . 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 CHOLECYSTECTOMY    . COLONOSCOPY  10/04/2012   SLF:ONE/8 SIMPLE ADENOMA, TICS, SML IH  . ESOPHAGOGASTRODUODENOSCOPY N/A 09/04/2014   Procedure: ESOPHAGOGASTRODUODENOSCOPY (EGD);  Surgeon: Danie Binder, MD;  Location: [facility] ENDO SU[facility];  Service: Endoscopy;  Laterality: N/A;  145  . GALLBLADDER SURGERY    . JAW BROKEN    . LIPOMA REMOVALS     SEVERAL  . SUPRAVENTRICULAR TACHYCARDIA ABLATION N/A 06/22/2014   Procedure: SUPRAVENTRICULAR TACHYCARDIA ABLATION;  Surgeon: Evans Lance, MD;  Location: [facility] CATH LAB;  Service: Cardiovascular;  Laterality: N/A;        Home Medications    Prior to Admission medications   Medication Sig Start  Date End Date Taking? Authorizing Provider  albuterol (PROVENTIL) (2.5 MG/3ML) 0.083% nebulizer solution Take 3 mLs (2.5 mg total) by nebulization every 6 (six) hours as needed for wheezing or shortness of breath. 07/26/18  Yes Eustaquio Maize, MD  aspirin EC 81 MG tablet Take 1 tablet (81 mg total) by mouth daily. Patient taking differently: Take 81 mg by mouth every evening.  09/18/18  Yes Eugenie Filler, MD  atorvastatin (LIPITOR) 40 MG tablet Take 1 tablet (40 mg total) by mouth daily at 6 PM. 09/09/18  Yes  Gottschalk, Ashly M, DO  budesonide-formoterol (SYMBICORT) 160-4.5 MCG/ACT inhaler Inhale 2 puffs into the lungs 2 (two) times daily. 07/26/18  Yes Eustaquio Maize, MD  doxazosin (CARDURA) 1 MG tablet Take 1 tablet (1 mg total) by mouth daily. 09/09/18  Yes Gottschalk, Leatrice Jewels M, DO  guaiFENesin (MUCINEX) 600 MG 12 hr tablet Take 600 mg by mouth at bedtime as needed for to loosen phlegm.   Yes [provider]  omeprazole (PRILOSEC) 40 MG capsule Take 1 capsule (40 mg total) by mouth daily. TAKE ONE (1) CAPSULE EACH DAY 09/17/18  Yes Eugenie Filler, MD  Pseudoeph-Doxylamine-DM-APAP (NYQUIL PO) Take 1 Dose by mouth at bedtime as needed (sleep).   Yes [provider]  Tiotropium Bromide Monohydrate (SPIRIVA RESPIMAT) 2.5 MCG/ACT AERS Inhale 2 puffs into the lungs daily. 09/26/18  Yes Martyn Ehrich, NP    Family History Family History  Problem Relation Age of Onset  . Emphysema Mother   . COPD Mother   . Early death Mother   . Hyperlipidemia Father   . Stroke Father   . Diabetes Father   . High Cholesterol Father   . Colon cancer Neg Hx     Social History Social History   Tobacco Use  . Smoking status: Current Every Day Smoker    Packs/day: 1.00    Years: 35.00    Pack years: 35.00    Types: Cigarettes  . Smokeless tobacco: Never Used  Substance Use Topics  . Alcohol use: No  . Drug use: No    Comment: in the 1980s, smoke crack. None since Dec 2011.      Allergies   Patient has no known allergies.   Review of Systems Review of Systems  Constitutional: Negative for fever.  Musculoskeletal: Positive for arthralgias.  Skin: Negative for wound.  Neurological: Negative for numbness.     Physical Exam Updated Vital Signs BP 110/89 (BP Location: Left Arm)   Pulse (!) 53   Temp 97.8 F (36.6 C) (Oral)   Resp 14   SpO2 98%   Physical Exam Vitals signs and nursing note reviewed.  Constitutional:      General: He is not in acute distress.     Appearance: He is well-developed. He is obese.  HENT:     Head: Atraumatic.  Eyes:     Conjunctiva/sclera: Conjunctivae normal.  Neck:     Musculoskeletal: Neck supple.  Musculoskeletal:        General: Tenderness (Right foot: Tenderness along the dorsal foot at the midfoot without any significant bruising noted.  No deformity.  No significant tenderness to the right ankle.  Dorsalis pedis pulse palpable with brisk cap refill) present.     Comments: Left groin: Tenderness along the left inguinal crease on palpation with normal hip range of motion, no pelvis instability, no bruising noted.  No significant midline spine tenderness crepitus or step-off.  Skin:    Findings:  No rash.  Neurological:     Mental Status: He is alert.      ED Treatments / Results  Labs (all labs ordered are listed, but only abnormal results are displayed) Labs Reviewed - No data to display  EKG None  Radiology Dg Foot Complete Right  Result Date: 10/04/2018 CLINICAL DATA:  Recent fall with right foot pain, initial encounter EXAM: RIGHT FOOT COMPLETE - 3+ VIEW COMPARISON:  06/26/2013 FINDINGS: Mild irregularity is noted along the dorsal aspect of the distal talus consistent with prior injury. No acute fracture or dislocation is noted. Mild tarsal degenerative changes are seen. IMPRESSION: Chronic changes without acute abnormality. Electronically Signed   By: Inez Catalina M.D.   On: 10/04/2018 08:07   Dg Hip Unilat W Or Wo Pelvis 2-3 Views Left  Result Date: 10/04/2018 CLINICAL DATA:  Recent fall with left hip pain, initial encounter EXAM: DG HIP (WITH OR WITHOUT PELVIS) 3V LEFT COMPARISON:  None. FINDINGS: There is no evidence of hip fracture or dislocation. Mild degenerative changes are noted. No soft tissue abnormality is seen. IMPRESSION: No acute abnormality noted. Electronically Signed   By: Inez Catalina M.D.   On: 10/04/2018 08:06    Procedures Procedures (including critical care time)  Medications  Ordered in ED Medications  morphine 4 MG/ML injection 4 mg (4 mg Intravenous Given 10/04/18 0721)     Initial Impression / Assessment and Plan / ED Course  I have reviewed the triage vital signs and the nursing notes.  Pertinent labs & imaging results that were available during my care of the patient were reviewed by me and considered in my medical decision making (see chart for details).     BP 110/66   Pulse (!) 54   Temp 97.8 F (36.6 C) (Oral)   Resp 15   SpO2 96%    Final Clinical Impressions(s) / ED Diagnoses   Final diagnoses:  Right foot strain, initial encounter  Hip strain, left, initial encounter    ED Discharge Orders         Ordered    acetaminophen (TYLENOL) 500 MG tablet  Every 6 hours PRN     10/04/18 0847    cyclobenzaprine (FLEXERIL) 10 MG tablet  2 times daily PRN     10/04/18 0847         7:14 AM Patient here for evaluation of a mechanical fall yesterday.  Primary complaint is pain to his right foot and left pelvic region.  Will obtain appropriate x-rays for further evaluation, pain medication given.  Patient was scheduled to have heart catheterization today.  8:45 AM X-ray of the left hip and pelvis without any acute disease.  X-ray of the right foot show no acute changes.  Suspect muscular skeletal strain causing pain.  Rice therapy discussed.  Encourage patient to call and follow-up to have his heart catheterization rescheduled.   Domenic Moras, PA-C 10/04/18 0848    Deno Etienne, DO 10/04/18 2259

## 2018-10-04 NOTE — ED Notes (Signed)
Patient transported to X-ray 

## 2018-10-04 NOTE — ED Triage Notes (Signed)
Patient here from cath lab.  Patient fell last night over a gas pump, hurting himself in left groin area and right foot.  Patient denies any LOC, full recall of incident.  Patient is in great pain when ambulating and with any movement of the left leg.  No bruising noted to areas or swelling.  Cath not being done today due to amount of pain patient is in.

## 2018-10-07 ENCOUNTER — Telehealth: Payer: Self-pay | Admitting: *Deleted

## 2018-10-07 NOTE — Telephone Encounter (Signed)
Pt contacted pre-catheterization scheduled at [facility] for: Tuesday January 14,2020 9 AM Verified arrival time and place: [place] Entrance A at: 7 AM  No solid food after midnight prior to cath, clear liquids until 5 AM day of procedure. Contrast allergy: no Verified no diabetes medications.  AM meds can be  taken pre-cath with sip of water including: ASA 81 mg  Confirmed patient has responsible person to drive home post procedure and for 24 hours after you arrive home.  LMTCB to review instructions with patient, ask about right foot/left hip pain.

## 2018-10-07 NOTE — Telephone Encounter (Signed)
No answer

## 2018-10-07 NOTE — Telephone Encounter (Signed)
No answer, voice mail 

## 2018-10-08 ENCOUNTER — Encounter (HOSPITAL_COMMUNITY)
Admission: RE | Disposition: A | Discharge status: Home / Self Care | Payer: Self-pay | Source: Home / Self Care | Attending: Cardiology

## 2018-10-08 ENCOUNTER — Ambulatory Visit (HOSPITAL_COMMUNITY)
Admission: RE | Admit: 2018-10-08 | Discharge: 2018-10-08 | Disposition: A | Discharge status: Home / Self Care | Payer: Self-pay | Attending: Cardiology | Admitting: Cardiology

## 2018-10-08 ENCOUNTER — Other Ambulatory Visit: Payer: Self-pay

## 2018-10-08 DIAGNOSIS — E1159 Type 2 diabetes mellitus with other circulatory complications: Secondary | ICD-10-CM | POA: Diagnosis present

## 2018-10-08 DIAGNOSIS — I471 Supraventricular tachycardia, unspecified: Secondary | ICD-10-CM | POA: Diagnosis present

## 2018-10-08 DIAGNOSIS — Z7951 Long term (current) use of inhaled steroids: Secondary | ICD-10-CM | POA: Insufficient documentation

## 2018-10-08 DIAGNOSIS — I272 Pulmonary hypertension, unspecified: Secondary | ICD-10-CM | POA: Insufficient documentation

## 2018-10-08 DIAGNOSIS — K219 Gastro-esophageal reflux disease without esophagitis: Secondary | ICD-10-CM | POA: Insufficient documentation

## 2018-10-08 DIAGNOSIS — G4733 Obstructive sleep apnea (adult) (pediatric): Secondary | ICD-10-CM | POA: Diagnosis present

## 2018-10-08 DIAGNOSIS — Z7982 Long term (current) use of aspirin: Secondary | ICD-10-CM | POA: Insufficient documentation

## 2018-10-08 DIAGNOSIS — I152 Hypertension secondary to endocrine disorders: Secondary | ICD-10-CM | POA: Diagnosis present

## 2018-10-08 DIAGNOSIS — Z720 Tobacco use: Secondary | ICD-10-CM | POA: Diagnosis present

## 2018-10-08 DIAGNOSIS — E669 Obesity, unspecified: Secondary | ICD-10-CM | POA: Insufficient documentation

## 2018-10-08 DIAGNOSIS — Z823 Family history of stroke: Secondary | ICD-10-CM | POA: Insufficient documentation

## 2018-10-08 DIAGNOSIS — Z6841 Body Mass Index (BMI) 40.0 and over, adult: Secondary | ICD-10-CM | POA: Insufficient documentation

## 2018-10-08 DIAGNOSIS — R0602 Shortness of breath: Secondary | ICD-10-CM | POA: Diagnosis present

## 2018-10-08 DIAGNOSIS — R072 Precordial pain: Secondary | ICD-10-CM | POA: Insufficient documentation

## 2018-10-08 DIAGNOSIS — Z8249 Family history of ischemic heart disease and other diseases of the circulatory system: Secondary | ICD-10-CM | POA: Insufficient documentation

## 2018-10-08 DIAGNOSIS — Z79899 Other long term (current) drug therapy: Secondary | ICD-10-CM | POA: Insufficient documentation

## 2018-10-08 DIAGNOSIS — I10 Essential (primary) hypertension: Secondary | ICD-10-CM | POA: Insufficient documentation

## 2018-10-08 DIAGNOSIS — F17210 Nicotine dependence, cigarettes, uncomplicated: Secondary | ICD-10-CM | POA: Insufficient documentation

## 2018-10-08 DIAGNOSIS — E782 Mixed hyperlipidemia: Secondary | ICD-10-CM | POA: Insufficient documentation

## 2018-10-08 DIAGNOSIS — R079 Chest pain, unspecified: Secondary | ICD-10-CM | POA: Diagnosis present

## 2018-10-08 HISTORY — PX: RIGHT/LEFT HEART CATH AND CORONARY ANGIOGRAPHY: CATH118266

## 2018-10-08 LAB — POCT I-STAT 3, ART BLOOD GAS (G3+)
BICARBONATE: 25.9 mmol/L (ref 20.0–28.0)
O2 Saturation: 97 %
PO2 ART: 91 mmHg (ref 83.0–108.0)
TCO2: 27 mmol/L (ref 22–32)
pCO2 arterial: 46.9 mmHg (ref 32.0–48.0)
pH, Arterial: 7.351 (ref 7.350–7.450)

## 2018-10-08 SURGERY — RIGHT/LEFT HEART CATH AND CORONARY ANGIOGRAPHY
Anesthesia: LOCAL

## 2018-10-08 MED ORDER — VERAPAMIL HCL 2.5 MG/ML IV SOLN
INTRAVENOUS | Status: AC
  Filled 2018-10-08: qty 2

## 2018-10-08 MED ORDER — HEPARIN (PORCINE) IN NACL 1000-0.9 UT/500ML-% IV SOLN
INTRAVENOUS | Status: DC | PRN
  Administered 2018-10-08 (×2): 500 mL

## 2018-10-08 MED ORDER — LIDOCAINE HCL (PF) 1 % IJ SOLN
INTRAMUSCULAR | Status: AC
  Filled 2018-10-08: qty 30

## 2018-10-08 MED ORDER — SODIUM CHLORIDE 0.9% FLUSH: 3.0000 mL | Freq: Two times a day (BID) | INTRAVENOUS | Status: DC

## 2018-10-08 MED ORDER — SODIUM CHLORIDE 0.9 % WEIGHT BASED INFUSION
3.0000 mL/kg/h | INTRAVENOUS | Status: AC
  Administered 2018-10-08: 3 mL/kg/h via INTRAVENOUS

## 2018-10-08 MED ORDER — SODIUM CHLORIDE 0.9% FLUSH: 3.0000 mL | INTRAVENOUS | Status: DC | PRN

## 2018-10-08 MED ORDER — MIDAZOLAM HCL 2 MG/2ML IJ SOLN
INTRAMUSCULAR | Status: DC | PRN
  Administered 2018-10-08: 1 mg via INTRAVENOUS

## 2018-10-08 MED ORDER — SODIUM CHLORIDE 0.9 % IV SOLN: 250.0000 mL | INTRAVENOUS | Status: DC | PRN

## 2018-10-08 MED ORDER — ACETAMINOPHEN 325 MG PO TABS: 650.0000 mg | ORAL_TABLET | ORAL | Status: DC | PRN

## 2018-10-08 MED ORDER — FENTANYL CITRATE (PF) 100 MCG/2ML IJ SOLN
INTRAMUSCULAR | Status: AC
  Filled 2018-10-08: qty 2

## 2018-10-08 MED ORDER — SODIUM CHLORIDE 0.9 % WEIGHT BASED INFUSION: 1.0000 mL/kg/h | INTRAVENOUS | Status: DC

## 2018-10-08 MED ORDER — HEPARIN SODIUM (PORCINE) 1000 UNIT/ML IJ SOLN
INTRAMUSCULAR | Status: AC
  Filled 2018-10-08: qty 1

## 2018-10-08 MED ORDER — SODIUM CHLORIDE 0.9 % WEIGHT BASED INFUSION: 1.0000 mL/kg/h | INTRAVENOUS | Status: AC

## 2018-10-08 MED ORDER — MIDAZOLAM HCL 2 MG/2ML IJ SOLN
INTRAMUSCULAR | Status: AC
  Filled 2018-10-08: qty 2

## 2018-10-08 MED ORDER — VERAPAMIL HCL 2.5 MG/ML IV SOLN
INTRAVENOUS | Status: DC | PRN
  Administered 2018-10-08: 10 mL via INTRA_ARTERIAL

## 2018-10-08 MED ORDER — ASPIRIN 81 MG PO CHEW: 81.0000 mg | CHEWABLE_TABLET | ORAL | Status: DC

## 2018-10-08 MED ORDER — FENTANYL CITRATE (PF) 100 MCG/2ML IJ SOLN
INTRAMUSCULAR | Status: DC | PRN
  Administered 2018-10-08: 25 ug via INTRAVENOUS

## 2018-10-08 MED ORDER — HEPARIN (PORCINE) IN NACL 1000-0.9 UT/500ML-% IV SOLN
INTRAVENOUS | Status: AC
  Filled 2018-10-08: qty 500

## 2018-10-08 MED ORDER — HEPARIN SODIUM (PORCINE) 1000 UNIT/ML IJ SOLN
INTRAMUSCULAR | Status: DC | PRN
  Administered 2018-10-08: 6000 [IU] via INTRAVENOUS

## 2018-10-08 MED ORDER — IOHEXOL 350 MG/ML SOLN
INTRAVENOUS | Status: DC | PRN
  Administered 2018-10-08: 55 mL via INTRAVENOUS

## 2018-10-08 MED ORDER — ONDANSETRON HCL 4 MG/2ML IJ SOLN: 4.0000 mg | Freq: Four times a day (QID) | INTRAMUSCULAR | Status: DC | PRN

## 2018-10-08 MED ORDER — LIDOCAINE HCL (PF) 1 % IJ SOLN
INTRAMUSCULAR | Status: DC | PRN
  Administered 2018-10-08: 2 mL
  Administered 2018-10-08: 4 mL

## 2018-10-08 SURGICAL SUPPLY — 13 items
CATH 5FR JL3.5 JR4 ANG PIG MP (CATHETERS) ×1 IMPLANT
CATH BALLN WEDGE 5F 110CM (CATHETERS) ×1 IMPLANT
DEVICE RAD COMP TR BAND LRG (VASCULAR PRODUCTS) ×1 IMPLANT
GLIDESHEATH SLEND SS 6F .021 (SHEATH) ×1 IMPLANT
GUIDEWIRE INQWIRE 1.5J.035X260 (WIRE) IMPLANT
HOVERMATT SINGLE USE (MISCELLANEOUS) ×1 IMPLANT
INQWIRE 1.5J .035X260CM (WIRE) ×2
KIT HEART LEFT (KITS) ×2 IMPLANT
PACK CARDIAC CATHETERIZATION (CUSTOM PROCEDURE TRAY) ×2 IMPLANT
SHEATH GLIDE SLENDER 4/5FR (SHEATH) ×1 IMPLANT
SYR MEDRAD MARK 7 150ML (SYRINGE) ×2 IMPLANT
TRANSDUCER W/STOPCOCK (MISCELLANEOUS) ×2 IMPLANT
TUBING CIL FLEX 10 FLL-RA (TUBING) ×2 IMPLANT

## 2018-10-08 NOTE — Discharge Instructions (Signed)
Radial Site Care ° °This sheet gives you information about how to care for yourself after your procedure. Your health care provider may also give you more specific instructions. If you have problems or questions, contact your health care provider. °What can I expect after the procedure? °After the procedure, it is common to have: °· Bruising and tenderness at the catheter insertion area. °Follow these instructions at home: °Medicines °· Take over-the-counter and prescription medicines only as told by your health care provider. °Insertion site care °· Follow instructions from your health care provider about how to take care of your insertion site. Make sure you: °? Wash your hands with soap and water before you change your bandage (dressing). If soap and water are not available, use hand sanitizer. °? Change your dressing as told by your health care provider. °? Leave stitches (sutures), skin glue, or adhesive strips in place. These skin closures may need to stay in place for 2 weeks or longer. If adhesive strip edges start to loosen and curl up, you may trim the loose edges. Do not remove adhesive strips completely unless your health care provider tells you to do that. °· Check your insertion site every day for signs of infection. Check for: °? Redness, swelling, or pain. °? Fluid or blood. °? Pus or a bad smell. °? Warmth. °· Do not take baths, swim, or use a hot tub until your health care provider approves. °· You may shower 24-48 hours after the procedure, or as directed by your health care provider. °? Remove the dressing and gently wash the site with plain soap and water. °? Pat the area dry with a clean towel. °? Do not rub the site. That could cause bleeding. °· Do not apply powder or lotion to the site. °Activity ° °· For 24 hours after the procedure, or as directed by your health care provider: °? Do not flex or bend the affected arm. °? Do not push or pull heavy objects with the affected arm. °? Do not  drive yourself home from the hospital or clinic. You may drive 24 hours after the procedure unless your health care provider tells you not to. °? Do not operate machinery or power tools. °· Do not lift anything that is heavier than 10 lb (4.5 kg), or the limit that you are told, until your health care provider says that it is safe. °· Ask your health care provider when it is okay to: °? Return to work or school. °? Resume usual physical activities or sports. °? Resume sexual activity. °General instructions °· If the catheter site starts to bleed, raise your arm and put firm pressure on the site. If the bleeding does not stop, get help right away. This is a medical emergency. °· If you went home on the same day as your procedure, a responsible adult should be with you for the first 24 hours after you arrive home. °· Keep all follow-up visits as told by your health care provider. This is important. °Contact a health care provider if: °· You have a fever. °· You have redness, swelling, or yellow drainage around your insertion site. °Get help right away if: °· You have unusual pain at the radial site. °· The catheter insertion area swells very fast. °· The insertion area is bleeding, and the bleeding does not stop when you hold steady pressure on the area. °· Your arm or hand becomes pale, cool, tingly, or numb. °These symptoms may represent a serious problem   that is an emergency. Do not wait to see if the symptoms will go away. Get medical help right away. Call your local emergency services (911 in the [place] Do not drive yourself to the hospital. °Summary °· After the procedure, it is common to have bruising and tenderness at the site. °· Follow instructions from your health care provider about how to take care of your radial site wound. Check the wound every day for signs of infection. °· Do not lift anything that is heavier than 10 lb (4.5 kg), or the limit that you are told, until your health care provider says  that it is safe. °This information is not intended to replace advice given to you by your health care provider. Make sure you discuss any questions you have with your health care provider. °Document Released: 10/14/2010 Document Revised: 10/17/2017 Document Reviewed: 10/17/2017 °Elsevier Interactive Patient Education © 2019 [employer] ° °

## 2018-10-08 NOTE — Interval H&P Note (Signed)
History and Physical Interval Note:  10/08/2018 9:27 AM  Thomas Ball  has presented today for surgery, with the diagnosis of DOE  The various methods of treatment have been discussed with the patient and family. After consideration of risks, benefits and other options for treatment, the patient has consented to  Procedure(s): RIGHT/LEFT HEART CATH AND CORONARY ANGIOGRAPHY (N/A) as a surgical intervention .  The patient's history has been reviewed, patient examined, no change in status, stable for surgery.  I have reviewed the patient's chart and labs.  Questions were answered to the patient's satisfaction.   Cath Lab Visit (complete for each Cath Lab visit)  Clinical Evaluation Leading to the Procedure:   ACS: No.  Non-ACS:    Anginal Classification: CCS II  Anti-ischemic medical therapy: No Therapy  Non-Invasive Test Results: No non-invasive testing performed  Prior CABG: No previous CABG        Thomas Ball [facility] 10/08/2018 9:27 AM

## 2018-10-09 ENCOUNTER — Encounter (HOSPITAL_COMMUNITY): Payer: Self-pay | Admitting: Cardiology

## 2018-10-09 LAB — POCT I-STAT 3, VENOUS BLOOD GAS (G3P V)
Acid-base deficit: 1 mmol/L (ref 0.0–2.0)
Bicarbonate: 26.5 mmol/L (ref 20.0–28.0)
O2 Saturation: 70 %
PH VEN: 7.306 (ref 7.250–7.430)
TCO2: 28 mmol/L (ref 22–32)
pCO2, Ven: 53.2 mmHg (ref 44.0–60.0)
pO2, Ven: 41 mmHg (ref 32.0–45.0)

## 2018-10-27 ENCOUNTER — Emergency Department (HOSPITAL_COMMUNITY)
Admission: EM | Admit: 2018-10-27 | Discharge: 2018-10-27 | Disposition: A | Discharge status: Home / Self Care | Payer: Self-pay

## 2018-10-29 ENCOUNTER — Encounter: Payer: Self-pay | Admitting: Family Medicine

## 2018-10-29 ENCOUNTER — Ambulatory Visit (INDEPENDENT_AMBULATORY_CARE_PROVIDER_SITE_OTHER): Payer: Self-pay | Admitting: Family Medicine

## 2018-10-29 VITALS — BP 138/75 | HR 67 | Temp 97.7°F | Ht 65.0 in | Wt 299.0 lb

## 2018-10-29 DIAGNOSIS — J189 Pneumonia, unspecified organism: Secondary | ICD-10-CM

## 2018-10-29 DIAGNOSIS — S99921A Unspecified injury of right foot, initial encounter: Secondary | ICD-10-CM

## 2018-10-29 DIAGNOSIS — R6889 Other general symptoms and signs: Secondary | ICD-10-CM

## 2018-10-29 DIAGNOSIS — J181 Lobar pneumonia, unspecified organism: Secondary | ICD-10-CM

## 2018-10-29 MED ORDER — AZITHROMYCIN 250 MG PO TABS: ORAL_TABLET | ORAL | 0 refills | Status: DC

## 2018-10-29 MED ORDER — BENZONATATE 200 MG PO CAPS: 200.0000 mg | ORAL_CAPSULE | Freq: Two times a day (BID) | ORAL | 0 refills | Status: DC | PRN

## 2018-10-29 NOTE — Progress Notes (Signed)
Subjective: CC: Flulike symptoms   PCP: [name], DO Thomas Ball Hemann is a 58 y.o. male presenting to clinic today for:  1.  Flulike symptoms Patient reports onset of productive cough with brown sputum, fatigue, sinus congestion about a week ago.  He notes it substantially worsened on Saturday.  He denies any associated nausea, vomiting, diarrhea, fevers, chills.  He has been using NyQuil and home albuterol with little improvement in symptoms.  Of note, he notes discontinuing use of Symbicort and Spiriva secondary to financial constraints.  2.  Toe injury Patient reports yesterday, he dropped a 45 pound steel bar on his right fourth toe.  Subsequently he had bruising and discoloration of the toe.  He has had pain and swelling as well.  He continues to ambulate independently and wear normal footwear but wanted to have this checked out.   ROS: Per HPI  No Known Allergies Past Medical History:  Diagnosis Date  . GERD (gastroesophageal reflux disease)   . HCV (hepatitis C virus) DR. ZACKS   AUG 2013 VIRAL LOAD UNDETECTABLE  . Hypercholesteremia   . Hypertension   . Pancreatitis, gallstone   . 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 SVT (supraventricular tachycardia) ([facility])     Current Outpatient Medications:  .  acetaminophen (TYLENOL) 500 MG tablet, Take 1 tablet (500 mg total) by mouth every 6 (six) hours as needed., Disp: 30 tablet, Rfl: 0 .  albuterol (PROVENTIL) (2.5 MG/3ML) 0.083% nebulizer solution, Take 3 mLs (2.5 mg total) by nebulization every 6 (six) hours as needed for wheezing or shortness of breath., Disp: 150 mL, Rfl: 0 .  aspirin EC 81 MG tablet, Take 1 tablet (81 mg total) by mouth daily. (Patient taking differently: Take 81 mg by mouth every evening. ), Disp: , Rfl:  .  atorvastatin (LIPITOR) 40 MG tablet, Take 1 tablet (40 mg total) by mouth daily at 6 PM., Disp: 90 tablet, Rfl: 3 .  budesonide-formoterol (SYMBICORT) 160-4.5 MCG/ACT inhaler,  Inhale 2 puffs into the lungs 2 (two) times daily., Disp: 1 Inhaler, Rfl: 3 .  cyclobenzaprine (FLEXERIL) 10 MG tablet, Take 1 tablet (10 mg total) by mouth 2 (two) times daily as needed for muscle spasms., Disp: 20 tablet, Rfl: 0 .  doxazosin (CARDURA) 1 MG tablet, Take 1 tablet (1 mg total) by mouth daily., Disp: 90 tablet, Rfl: 2 .  guaiFENesin (MUCINEX) 600 MG 12 hr tablet, Take 600 mg by mouth at bedtime as needed for to loosen phlegm., Disp: , Rfl:  .  omeprazole (PRILOSEC) 40 MG capsule, Take 1 capsule (40 mg total) by mouth daily. TAKE ONE (1) CAPSULE EACH DAY, Disp: 30 capsule, Rfl: 0 .  Pseudoeph-Doxylamine-DM-APAP (NYQUIL PO), Take 1 Dose by mouth at bedtime as needed (sleep)., Disp: , Rfl:  .  Tiotropium Bromide Monohydrate (SPIRIVA RESPIMAT) 2.5 MCG/ACT AERS, Inhale 2 puffs into the lungs daily., Disp: 4 g, Rfl: 2 Social History   Socioeconomic History  . Marital status: Married    Spouse name: Not on file  . Number of children: Not on file  . Years of education: Not on file  . Highest education level: Not on file  Occupational History  . Occupation: concrete work    Fish farm manager: SELF-EMPLOYED  Social Needs  . Financial resource strain: Not on file  . Food insecurity:    Worry: Not on file    Inability: Not on file  . Transportation needs:    Medical:  Not on file    Non-medical: Not on file  Tobacco Use  . Smoking status: Current Every Day Smoker    Packs/day: 1.00    Years: 35.00    Pack years: 35.00    Types: Cigarettes  . Smokeless tobacco: Never Used  Substance and Sexual Activity  . Alcohol use: No  . Drug use: No    Comment: in the 1980s, smoke crack. None since Dec 2011.   Marland Kitchen Sexual activity: Not on file  Lifestyle  . Physical activity:    Days per week: Not on file    Minutes per session: Not on file  . Stress: Not on file  Relationships  . Social connections:    Talks on phone: Not on file    Gets together: Not on file    Attends religious service: Not  on file    Active member of club or organization: Not on file    Attends meetings of clubs or organizations: Not on file    Relationship status: Not on file  . Intimate partner violence:    Fear of current or ex partner: Not on file    Emotionally abused: Not on file    Physically abused: Not on file    Forced sexual activity: Not on file  Other Topics Concern  . Not on file  Social History Narrative  . Not on file   Family History  Problem Relation Age of Onset  . Emphysema Mother   . COPD Mother   . Early death Mother   . Hyperlipidemia Father   . Stroke Father   . Diabetes Father   . High Cholesterol Father   . Colon cancer Neg Hx     Objective: Office vital signs reviewed. BP 138/75   Pulse 67   Temp 97.7 F (36.5 C) (Oral)   Ht 5\' 5"  (1.651 m)   Wt 299 lb (135.6 kg)   BMI 49.76 kg/m   Physical Examination:  General: Awake, alert, obese, tired appearing, No acute distress HEENT: Normal, sclera white, MMM Cardio: regular rate and rhythm, S1S2 heard, no murmurs appreciated Pulm: Decreased breath sounds in the right lower lung fields. No wheezes, rhonchi or rales; normal work of breathing on room air. Coughing intermittently during exam. Extremities: warm, well perfused, No edema, cyanosis or clubbing; +2 pulses bilaterally MSK:   Right foot: Right fourth toe with bruising and mild swelling.  He does have tenderness to palpation.  He does have active range of motion but has pain with active range of motion.  No skin breakdown noted Neuro: Light touch sensation grossly intact.  Assessment/ Plan: 58 y.o. male   1. Community acquired pneumonia of right lower lobe of lung ([place]) Given decreased breath sounds in the right lower lung fields, reports of brown sputum and worsening of symptoms, I have empirically treated him for community-acquired pneumonia with a Z-Pak.  Tessalon Perles also prescribed.  His EKG was reviewed which demonstrated normal QT interval at last  check.  I have reviewed reasons for return and emergent evaluation emergency department.  He will follow-up PRN.  2. Flu-like symptoms Rapid flu was negative. - Veritor Flu A/B Waived  3. Injury of toe on right foot, initial encounter Question possible fracture but patient declined imaging of the foot today.  We have buddy taped his toe prior to discharge and a handout was provided on signs and symptoms to look out for.  I offered him a postop shoe but he declined this  today.  I recommended that he use steel toe boots at work.   Orders Placed This Encounter  Procedures  . Veritor Flu A/B Waived    Order Specific Question:   Source    Answer:   nasal   Meds ordered this encounter  Medications  . azithromycin (ZITHROMAX) 250 MG tablet    Sig: Take 2 tablets today, then take 1 tablet daily until gone.    Dispense:  6 tablet    Refill:  0  . benzonatate (TESSALON) 200 MG capsule    Sig: Take 1 capsule (200 mg total) by mouth 2 (two) times daily as needed for cough.    Dispense:  20 capsule    Refill:  [place], DO [place] [phone]

## 2018-10-29 NOTE — Patient Instructions (Signed)
Your flu test was negative.  I suspect this is actually an infection of the lung.  You had decreased breath sounds on the right lower lung fields and given that you had brown sputum I am empirically treating you with antibiotics to cover for pneumonia.  I have also prescribed you a cough Perle.  If your symptoms worsen or he develop any other worrisome symptoms or signs like shortness of breath, coughing up blood, high fevers, please seek immediate medical attention.  For your toe, you may have a fracture but you declined x-ray today.  I recommend continuing buddy taping of the toe.  Still toed boots may benefit you given the nature of your injury.   Community-Acquired Pneumonia, Adult Pneumonia is an infection of the lungs. It causes swelling in the airways of the lungs. Mucus and fluid may also build up inside the airways. One type of pneumonia can happen while a person is in a hospital. A different type can happen when a person is not in a hospital (community-acquired pneumonia).  What are the causes?  This condition is caused by germs (viruses, bacteria, or fungi). Some types of germs can be passed from one person to another. This can happen when you breathe in droplets from the cough or sneeze of an infected person. What increases the risk? You are more likely to develop this condition if you:  Have a long-term (chronic) disease, such as: ? Chronic obstructive pulmonary disease (COPD). ? Asthma. ? Cystic fibrosis. ? Congestive heart failure. ? Diabetes. ? Kidney disease.  Have HIV.  Have sickle cell disease.  Have had your spleen removed.  Do not take good care of your teeth and mouth (poor dental hygiene).  Have a medical condition that increases the risk of breathing in droplets from your own mouth and nose.  Have a weakened body defense system (immune system).  Are a smoker.  Travel to areas where the germs that cause this illness are common.  Are around certain animals  or the places they live. What are the signs or symptoms?  A dry cough.  A wet (productive) cough.  Fever.  Sweating.  Chest pain. This often happens when breathing deeply or coughing.  Fast breathing or trouble breathing.  Shortness of breath.  Shaking chills.  Feeling tired (fatigue).  Muscle aches. How is this treated? Treatment for this condition depends on many things. Most adults can be treated at home. In some cases, treatment must happen in a hospital. Treatment may include:  Medicines given by mouth or through an IV tube.  Being given extra oxygen.  Respiratory therapy. In rare cases, treatment for very bad pneumonia may include:  Using a machine to help you breathe.  Having a procedure to remove fluid from around your lungs. Follow these instructions at home: Medicines  Take over-the-counter and prescription medicines only as told by your doctor. ? Only take cough medicine if you are losing sleep.  If you were prescribed an antibiotic medicine, take it as told by your doctor. Do not stop taking the antibiotic even if you start to feel better. General instructions   Sleep with your head and neck raised (elevated). You can do this by sleeping in a recliner or by putting a few pillows under your head.  Rest as needed. Get at least 8 hours of sleep each night.  Drink enough water to keep your pee (urine) pale yellow.  Eat a healthy diet that includes plenty of vegetables, fruits, whole grains,  low-fat dairy products, and lean protein.  Do not use any products that contain nicotine or tobacco. These include cigarettes, e-cigarettes, and chewing tobacco. If you need help quitting, ask your doctor.  Keep all follow-up visits as told by your doctor. This is important. How is this prevented? A shot (vaccine) can help prevent pneumonia. Shots are often suggested for:  People older than 57 years of age.  People older than 57 years of age who: ? Are having  cancer treatment. ? Have long-term (chronic) lung disease. ? Have problems with their body's defense system. You may also prevent pneumonia if you take these actions:  Get the flu (influenza) shot every year.  Go to the dentist as often as told.  Wash your hands often. If you cannot use soap and water, use hand sanitizer. Contact a doctor if:  You have a fever.  You lose sleep because your cough medicine does not help. Get help right away if:  You are short of breath and it gets worse.  You have more chest pain.  Your sickness gets worse. This is very serious if: ? You are an older adult. ? Your body's defense system is weak.  You cough up blood. Summary  Pneumonia is an infection of the lungs.  Most adults can be treated at home. Some will need treatment in a hospital.  Drink enough water to keep your pee pale yellow.  Get at least 8 hours of sleep each night. This information is not intended to replace advice given to you by your health care provider. Make sure you discuss any questions you have with your health care provider. Document Released: 02/28/2008 Document Revised: 05/09/2018 Document Reviewed: 05/09/2018 Elsevier Interactive Patient Education  2019 [employer]  Toe Fracture A toe fracture is a break in one of the toe bones (phalanges). This may happen if you:  Drop a heavy object on your toe.  Stub your toe.  Twist your toe.  Exercise the same way too much. What are the signs or symptoms? The main symptoms are swelling and pain in the toe. You may also have:  Bruising.  Stiffness.  Numbness.  A change in the way the toe looks.  Broken bones that poke through the skin.  Blood under the toenail. How is this treated? Treatments may include:  Taping the broken toe to a toe that is next to it (buddy taping).  Wearing a shoe that has a wide, rigid sole to protect the toe and to limit its movement.  Wearing a cast.  Surgery. This may be  needed if the: ? Pieces of broken bone are out of place. ? Bone pokes through the skin.  Physical therapy. Follow these instructions at home: If you have a shoe:  Wear the shoe as told by your doctor. Remove it only as told by your doctor.  Loosen the shoe if your toes tingle, become numb, or turn cold and blue.  Keep the shoe clean and dry. If you have a cast:  Do not put pressure on any part of the cast until it is fully hardened. This may take a few hours.  Do not stick anything inside the cast to scratch your skin.  Check the skin around the cast every day. Tell your doctor about any concerns.  You may put lotion on dry skin around the edges of the cast.  Do not put lotion on the skin under the cast.  Keep the cast clean and dry. Bathing  Do not take baths, swim, or use a hot tub until your doctor says it is okay. Ask your doctor if you can take showers.  If the shoe or cast is not waterproof: ? Do not let it get wet. ? Cover it with a watertight covering when you take a bath or a shower. Activity  Do not use your foot to support your body weight until your doctor says it is okay.  Use crutches as told by your doctor.  Ask your doctor what activities are safe for you during recovery.  Avoid activities as told by your doctor.  Do exercises as told by your doctor or therapist. Driving  Do not drive or use heavy machinery while taking pain medicine.  Do not drive while wearing a cast on a foot that you use for driving. Managing pain, stiffness, and swelling   Put ice on the injured area if told by your doctor: ? Put ice in a plastic bag. ? Place a towel between your skin and the bag.  If you have a shoe, remove it as told by your doctor.  If you have a cast, place a towel between your cast and the bag. ? Leave the ice on for 20 minutes, 2-3 times per day.  Raise (elevate) the injured area above the level of your heart while you are sitting or lying  down. General instructions  If your toe was taped to a toe that is next to it, follow your doctor's instructions for changing the gauze and tape. Change it more often: ? If the gauze and tape get wet. If this happens, dry the space between the toes. ? If the gauze and tape are too tight and they cause your toe to become pale or to lose feeling (go numb).  If your doctor did not give you a protective shoe, wear sturdy shoes that support your foot. Your shoes should not: ? Pinch your toes. ? Fit tightly against your toes.  Do not use any tobacco products, including cigarettes, chewing tobacco, or e-cigarettes. These can delay bone healing. If you need help quitting, ask your doctor.  Take medicines only as told by your doctor.  Keep all follow-up visits as told by your doctor. This is important. Contact a doctor if:  Your pain medicine is not helping.  You have a fever.  You notice a bad smell coming from your cast. Get help right away if:  You lose feeling (have numbness) in your toe or foot, and it is getting worse.  Your toe or your foot tingles.  Your toe or your foot gets cold or turns blue.  You have redness or swelling in your toe or foot, and it is getting worse.  You have very bad pain. Summary  A toe fracture is a break in one of the toe bones.  Use ice and raise your foot. This will help lessen pain and swelling.  Use crutches as told by your doctor. This information is not intended to replace advice given to you by your health care provider. Make sure you discuss any questions you have with your health care provider. Document Released: 02/28/2008 Document Revised: 10/23/2017 Document Reviewed: 10/23/2017 Elsevier Interactive Patient Education  2019 [employer].

## 2018-10-30 LAB — VERITOR FLU A/B WAIVED
Influenza A: NEGATIVE
Influenza B: NEGATIVE

## 2018-11-01 ENCOUNTER — Telehealth: Payer: Self-pay | Admitting: Family Medicine

## 2018-11-01 ENCOUNTER — Telehealth: Payer: Self-pay | Admitting: *Deleted

## 2018-11-01 MED ORDER — HYDROCODONE-HOMATROPINE 5-1.5 MG/5ML PO SYRP: 5.0000 mL | ORAL_SOLUTION | Freq: Four times a day (QID) | ORAL | 0 refills | Status: DC | PRN

## 2018-11-01 MED ORDER — AZITHROMYCIN 250 MG PO TABS: ORAL_TABLET | ORAL | 0 refills | Status: DC

## 2018-11-01 NOTE — Telephone Encounter (Signed)
I sent Hycodan cough syrup for the patient

## 2018-11-01 NOTE — Telephone Encounter (Signed)
Dr. Darnell Level patien

## 2018-11-01 NOTE — Addendum Note (Signed)
Addended by: Caryl Pina on: 11/01/2018 04:29 PM   Modules accepted: Orders

## 2018-11-01 NOTE — Telephone Encounter (Signed)
Patient aware.

## 2018-11-01 NOTE — Telephone Encounter (Signed)
Patient states that he would like a cough suppressant sent to the pharmacy as well Tessalon not helping

## 2018-11-01 NOTE — Telephone Encounter (Signed)
Go ahead and send him another refill on the Z-Pak

## 2018-11-05 ENCOUNTER — Ambulatory Visit: Payer: Self-pay | Admitting: Student

## 2018-11-05 NOTE — Progress Notes (Deleted)
Cardiology Office Note    Date:  11/05/2018   ID:  Thomas Ball, DOB 1961-06-25, MRN [id number]  PCP:  [name], DO  Cardiologist: Dorris Carnes, MD    No chief complaint on file.   History of Present Illness:    Thomas Ball is a 58 y.o. male with past medical history of HTN, HLD, pSVT (s/p ablation in 2015), OSA, and continued tobacco use who presents to the office today for follow-up from his recent cardiac catheterization.   He was last examined by myself in 09/2018 for follow-up for recent hospitalization during which she was experiencing intermittent episodes of chest discomfort at rest and with activity.  He denied any recurrent episodes of chest pain at the time of his office visit but was still having dyspnea on exertion.  Options were reviewed with the patient and he was in agreement to proceed with a cardiac catheterization for definitive evaluation. This was performed by Dr. Martinique on 10/08/2018 and showed normal coronary anatomy with normal LV function and LV filling pressures. Was noted to have mild pulmonary hypertension with continued medical management recommended.  Past Medical History:  Diagnosis Date  . GERD (gastroesophageal reflux disease)   . HCV (hepatitis C virus) DR. ZACKS   AUG 2013 VIRAL LOAD UNDETECTABLE  . Hypercholesteremia   . Hypertension   . Pancreatitis, gallstone   . 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 SVT (supraventricular tachycardia) (HCC)     Past Surgical History:  Procedure Laterality Date  . 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 CHOLECYSTECTOMY    . COLONOSCOPY  10/04/2012   SLF:ONE/8 SIMPLE ADENOMA, TICS, SML IH  . ESOPHAGOGASTRODUODENOSCOPY N/A 09/04/2014   Procedure: ESOPHAGOGASTRODUODENOSCOPY (EGD);  Surgeon: Danie Binder, MD;  Location: [facility];  Service: Endoscopy;  Laterality: N/A;  145  . GALLBLADDER SURGERY    . JAW BROKEN    . LIPOMA REMOVALS     SEVERAL  . RIGHT/LEFT HEART  CATH AND CORONARY ANGIOGRAPHY N/A 10/08/2018   Procedure: RIGHT/LEFT HEART CATH AND CORONARY ANGIOGRAPHY;  Surgeon: Martinique, Peter M, MD;  Location: [facility];  Service: Cardiovascular;  Laterality: N/A;  . SUPRAVENTRICULAR TACHYCARDIA ABLATION N/A 06/22/2014   Procedure: SUPRAVENTRICULAR TACHYCARDIA ABLATION;  Surgeon: Evans Lance, MD;  Location: [facility];  Service: Cardiovascular;  Laterality: N/A;    Current Medications: Outpatient Medications Prior to Visit  Medication Sig Dispense Refill  . acetaminophen (TYLENOL) 500 MG tablet Take 1 tablet (500 mg total) by mouth every 6 (six) hours as needed. 30 tablet 0  . albuterol (PROVENTIL) (2.5 MG/3ML) 0.083% nebulizer solution Take 3 mLs (2.5 mg total) by nebulization every 6 (six) hours as needed for wheezing or shortness of breath. 150 mL 0  . aspirin EC 81 MG tablet Take 1 tablet (81 mg total) by mouth daily. (Patient taking differently: Take 81 mg by mouth every evening. )    . atorvastatin (LIPITOR) 40 MG tablet Take 1 tablet (40 mg total) by mouth daily at 6 PM. 90 tablet 3  . azithromycin (ZITHROMAX) 250 MG tablet Take 2 tablets today, then take 1 tablet daily until gone. 6 tablet 0  . benzonatate (TESSALON) 200 MG capsule Take 1 capsule (200 mg total) by mouth 2 (two) times daily as needed for cough. 20 capsule 0  . budesonide-formoterol (SYMBICORT) 160-4.5 MCG/ACT inhaler Inhale 2 puffs into the lungs 2 (two) times daily.  1 Inhaler 3  . cyclobenzaprine (FLEXERIL) 10 MG tablet Take 1 tablet (10 mg total) by mouth 2 (two) times daily as needed for muscle spasms. 20 tablet 0  . doxazosin (CARDURA) 1 MG tablet Take 1 tablet (1 mg total) by mouth daily. 90 tablet 2  . guaiFENesin (MUCINEX) 600 MG 12 hr tablet Take 600 mg by mouth at bedtime as needed for to loosen phlegm.    Marland Kitchen HYDROcodone-homatropine (HYCODAN) 5-1.5 MG/5ML syrup Take 5 mLs by mouth every 6 (six) hours as needed for cough. 120 mL 0  . omeprazole (PRILOSEC) 40 MG  capsule Take 1 capsule (40 mg total) by mouth daily. TAKE ONE (1) CAPSULE EACH DAY 30 capsule 0  . Pseudoeph-Doxylamine-DM-APAP (NYQUIL PO) Take 1 Dose by mouth at bedtime as needed (sleep).    . Tiotropium Bromide Monohydrate (SPIRIVA RESPIMAT) 2.5 MCG/ACT AERS Inhale 2 puffs into the lungs daily. 4 g 2   No facility-administered medications prior to visit.      Allergies:   Patient has no known allergies.   Social History   Socioeconomic History  . Marital status: Married    Spouse name: Not on file  . Number of children: Not on file  . Years of education: Not on file  . Highest education level: Not on file  Occupational History  . Occupation: concrete work    Fish farm manager: SELF-EMPLOYED  Social Needs  . Financial resource strain: Not on file  . Food insecurity:    Worry: Not on file    Inability: Not on file  . Transportation needs:    Medical: Not on file    Non-medical: Not on file  Tobacco Use  . Smoking status: Current Every Day Smoker    Packs/day: 1.00    Years: 35.00    Pack years: 35.00    Types: Cigarettes  . Smokeless tobacco: Never Used  Substance and Sexual Activity  . Alcohol use: No  . Drug use: No    Comment: in the 1980s, smoke crack. None since Dec 2011.   Marland Kitchen Sexual activity: Not on file  Lifestyle  . Physical activity:    Days per week: Not on file    Minutes per session: Not on file  . Stress: Not on file  Relationships  . Social connections:    Talks on phone: Not on file    Gets together: Not on file    Attends religious service: Not on file    Active member of club or organization: Not on file    Attends meetings of clubs or organizations: Not on file    Relationship status: Not on file  Other Topics Concern  . Not on file  Social History Narrative  . Not on file     Family History:  The patient's ***family history includes COPD in his mother; Diabetes in his father; Early death in his mother; Emphysema in his mother; High Cholesterol in  his father; Hyperlipidemia in his father; Stroke in his father.   Review of Systems:   Please see the history of present illness.     General:  No chills, fever, night sweats or weight changes.  Cardiovascular:  No chest pain, dyspnea on exertion, edema, orthopnea, palpitations, paroxysmal nocturnal dyspnea. Dermatological: No rash, lesions/masses Respiratory: No cough, dyspnea Urologic: No hematuria, dysuria Abdominal:   No nausea, vomiting, diarrhea, bright red blood per rectum, melena, or hematemesis Neurologic:  No visual changes, wkns, changes in mental status. All other systems reviewed and are  otherwise negative except as noted above.   Physical Exam:    VS:  There were no vitals taken for this visit.   General: Well developed, well nourished,male appearing in no acute distress. Head: Normocephalic, atraumatic, sclera non-icteric, no xanthomas, nares are without discharge.  Neck: No carotid bruits. JVD not elevated.  Lungs: Respirations regular and unlabored, without wheezes or rales.  Heart: ***Regular rate and rhythm. No S3 or S4.  No murmur, no rubs, or gallops appreciated. Abdomen: Soft, non-tender, non-distended with normoactive bowel sounds. No hepatomegaly. No rebound/guarding. No obvious abdominal masses. Msk:  Strength and tone appear normal for age. No joint deformities or effusions. Extremities: No clubbing or cyanosis. No edema.  Distal pedal pulses are 2+ bilaterally. Neuro: Alert and oriented X 3. Moves all extremities spontaneously. No focal deficits noted. Psych:  Responds to questions appropriately with a normal affect. Skin: No rashes or lesions noted  Wt Readings from Last 3 Encounters:  10/29/18 299 lb (135.6 kg)  10/08/18 298 lb (135.2 kg)  09/27/18 (!) 305 lb (138.3 kg)        Studies/Labs Reviewed:   EKG:  EKG is*** ordered today.  The ekg ordered today demonstrates ***  Recent Labs: 09/09/2018: TSH 1.420 09/15/2018: ALT 24 09/16/2018: B  Natriuretic Peptide 178.0; Hemoglobin 14.8; Platelets 163 09/17/2018: BUN 27; Creatinine, Ser 0.90; Potassium 4.2; Sodium 137   Lipid Panel    Component Value Date/Time   CHOL 116 09/09/2018 1010   TRIG 132 09/09/2018 1010   HDL 38 (L) 09/09/2018 1010   CHOLHDL 3.1 09/09/2018 1010   CHOLHDL 5.4 09/06/2017 1321   VLDL 24 09/06/2017 1321   LDLCALC 52 09/09/2018 1010    Additional studies/ records that were reviewed today include:   Cardiac Catheterization: 10/08/2018  The left ventricular systolic function is normal.  LV end diastolic pressure is normal.  The left ventricular ejection fraction is 55-65% by visual estimate.  Hemodynamic findings consistent with mild pulmonary hypertension.   1. Normal coronary anatomy 2. Normal LV function 3. Normal LV filling pressures 4. Mild pulmonary HTN 5. Normal cardiac output.   Plan: medical management.    Assessment:    No diagnosis found.   Plan:   In order of problems listed above:  1. ***    Medication Adjustments/Labs and Tests Ordered: Current medicines are reviewed at length with the patient today.  Concerns regarding medicines are outlined above.  Medication changes, Labs and Tests ordered today are listed in the Patient Instructions below. There are no Patient Instructions on file for this visit.   Signed, Erma Heritage, PA-C  11/05/2018 11:12 AM    [facility]. [address] [place], [place] [address] Phone: [phone] Fax: [phone]

## 2018-11-26 ENCOUNTER — Ambulatory Visit (INDEPENDENT_AMBULATORY_CARE_PROVIDER_SITE_OTHER): Payer: Self-pay | Admitting: Student

## 2018-11-26 ENCOUNTER — Encounter: Payer: Self-pay | Admitting: Student

## 2018-11-26 VITALS — BP 118/68 | HR 67 | Ht 65.0 in | Wt 305.0 lb

## 2018-11-26 DIAGNOSIS — Z720 Tobacco use: Secondary | ICD-10-CM

## 2018-11-26 DIAGNOSIS — G4733 Obstructive sleep apnea (adult) (pediatric): Secondary | ICD-10-CM

## 2018-11-26 DIAGNOSIS — R0609 Other forms of dyspnea: Secondary | ICD-10-CM

## 2018-11-26 DIAGNOSIS — I471 Supraventricular tachycardia: Secondary | ICD-10-CM

## 2018-11-26 DIAGNOSIS — I272 Pulmonary hypertension, unspecified: Secondary | ICD-10-CM

## 2018-11-26 NOTE — Progress Notes (Signed)
Cardiology Office Note    Date:  11/26/2018   ID:  Thomas Ball, DOB Nov 06, 1960, MRN [id number]  PCP:  [name], DO  Cardiologist: Dorris Carnes, MD (last evaluated by Dr. Harrington Challenger in 2015; Consult by Dr. Domenic Polite during admission in 08/2018) EP: Dr. Lovena Le  Chief Complaint  Patient presents with  . Follow-up    s/p cardiac catheterization    History of Present Illness:    Thomas Ball is a 58 y.o. male with past medical history of HTN, HLD, pSVT (s/p ablation in 2015), OSA, and continued tobacco use who presents to the office today for follow-up from his cardiac catheterization.   He was last examined by myself in 09/2018 for follow-up for recent hospitalization during which he was experiencing intermittent episodes of chest discomfort at rest and with activity. He denied any recurrent episodes of chest pain at the time of his office visit but was still having dyspnea on exertion. Options were reviewed with the patient and he was in agreement to proceed with a cardiac catheterization for definitive evaluation. This was performed by Dr. Martinique on 10/08/2018 and showed normal coronary anatomy with normal LV function and LV filling pressures. Was noted to have mild pulmonary hypertension with continued medical management recommended.   In talking with the patient today, he reports still having dyspnea on exertion since his cardiac catheterization which is unchanged. He denies any repeat episodes of chest pain. No recent orthopnea, PND, or lower extremity edema. He does report good compliance with his CPAP and has been utilizing this on a nightly basis. He is scheduled for PFT's with Pulmonology next week.  He denies any complications regarding his radial catheterization site. Returned to work a few days after the procedure.   Past Medical History:  Diagnosis Date  . GERD (gastroesophageal reflux disease)   . HCV (hepatitis C virus) DR. ZACKS   AUG 2013 VIRAL LOAD UNDETECTABLE    . History of cardiac catheterization    a. normal cors by cath in 09/2018 with mild pulmonary HTN  . Hypercholesteremia   . Hypertension   . Pancreatitis, gallstone   . 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 SVT (supraventricular tachycardia) (HCC)     Past Surgical History:  Procedure Laterality Date  . 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 CHOLECYSTECTOMY    . COLONOSCOPY  10/04/2012   SLF:ONE/8 SIMPLE ADENOMA, TICS, SML IH  . ESOPHAGOGASTRODUODENOSCOPY N/A 09/04/2014   Procedure: ESOPHAGOGASTRODUODENOSCOPY (EGD);  Surgeon: Danie Binder, MD;  Location: [facility];  Service: Endoscopy;  Laterality: N/A;  145  . GALLBLADDER SURGERY    . JAW BROKEN    . LIPOMA REMOVALS     SEVERAL  . RIGHT/LEFT HEART CATH AND CORONARY ANGIOGRAPHY N/A 10/08/2018   Procedure: RIGHT/LEFT HEART CATH AND CORONARY ANGIOGRAPHY;  Surgeon: Martinique, Peter M, MD;  Location: [facility];  Service: Cardiovascular;  Laterality: N/A;  . SUPRAVENTRICULAR TACHYCARDIA ABLATION N/A 06/22/2014   Procedure: SUPRAVENTRICULAR TACHYCARDIA ABLATION;  Surgeon: Evans Lance, MD;  Location: [facility] CATH LAB;  Service: Cardiovascular;  Laterality: N/A;    Current Medications: Outpatient Medications Prior to Visit  Medication Sig Dispense Refill  . acetaminophen (TYLENOL) 500 MG tablet Take 1 tablet (500 mg total) by mouth every 6 (six) hours as needed. 30 tablet 0  . albuterol (PROVENTIL) (2.5 MG/3ML) 0.083% nebulizer solution Take 3 mLs (2.5 mg total) by nebulization every  6 (six) hours as needed for wheezing or shortness of breath. 150 mL 0  . aspirin EC 81 MG tablet Take 1 tablet (81 mg total) by mouth daily. (Patient taking differently: Take 81 mg by mouth every evening. )    . atorvastatin (LIPITOR) 40 MG tablet Take 1 tablet (40 mg total) by mouth daily at 6 PM. 90 tablet 3  . budesonide-formoterol (SYMBICORT) 160-4.5 MCG/ACT inhaler Inhale 2 puffs into the lungs 2 (two) times daily. 1  Inhaler 3  . cyclobenzaprine (FLEXERIL) 10 MG tablet Take 1 tablet (10 mg total) by mouth 2 (two) times daily as needed for muscle spasms. 20 tablet 0  . doxazosin (CARDURA) 1 MG tablet Take 1 tablet (1 mg total) by mouth daily. 90 tablet 2  . HYDROcodone-homatropine (HYCODAN) 5-1.5 MG/5ML syrup Take 5 mLs by mouth every 6 (six) hours as needed for cough. 120 mL 0  . omeprazole (PRILOSEC) 40 MG capsule Take 1 capsule (40 mg total) by mouth daily. TAKE ONE (1) CAPSULE EACH DAY 30 capsule 0  . Pseudoeph-Doxylamine-DM-APAP (NYQUIL PO) Take 1 Dose by mouth at bedtime as needed (sleep).    . Tiotropium Bromide Monohydrate (SPIRIVA RESPIMAT) 2.5 MCG/ACT AERS Inhale 2 puffs into the lungs daily. 4 g 2  . azithromycin (ZITHROMAX) 250 MG tablet Take 2 tablets today, then take 1 tablet daily until gone. 6 tablet 0  . benzonatate (TESSALON) 200 MG capsule Take 1 capsule (200 mg total) by mouth 2 (two) times daily as needed for cough. 20 capsule 0  . guaiFENesin (MUCINEX) 600 MG 12 hr tablet Take 600 mg by mouth at bedtime as needed for to loosen phlegm.     No facility-administered medications prior to visit.      Allergies:   Patient has no known allergies.   Social History   Socioeconomic History  . Marital status: Married    Spouse name: Not on file  . Number of children: Not on file  . Years of education: Not on file  . Highest education level: Not on file  Occupational History  . Occupation: concrete work    Fish farm manager: SELF-EMPLOYED  Social Needs  . Financial resource strain: Not on file  . Food insecurity:    Worry: Not on file    Inability: Not on file  . Transportation needs:    Medical: Not on file    Non-medical: Not on file  Tobacco Use  . Smoking status: Current Every Day Smoker    Packs/day: 1.00    Years: 35.00    Pack years: 35.00    Types: Cigarettes  . Smokeless tobacco: Never Used  Substance and Sexual Activity  . Alcohol use: No  . Drug use: No    Comment: in the  1980s, smoke crack. None since Dec 2011.   Marland Kitchen Sexual activity: Not on file  Lifestyle  . Physical activity:    Days per week: Not on file    Minutes per session: Not on file  . Stress: Not on file  Relationships  . Social connections:    Talks on phone: Not on file    Gets together: Not on file    Attends religious service: Not on file    Active member of club or organization: Not on file    Attends meetings of clubs or organizations: Not on file    Relationship status: Not on file  Other Topics Concern  . Not on file  Social History Narrative  . Not on  file     Family History:  The patient's family history includes COPD in his mother; Diabetes in his father; Early death in his mother; Emphysema in his mother; High Cholesterol in his father; Hyperlipidemia in his father; Stroke in his father.   Review of Systems:   Please see the history of present illness.     General:  No chills, fever, night sweats or weight changes.  Cardiovascular:  No chest pain, edema, orthopnea, palpitations, paroxysmal nocturnal dyspnea. Positive for dyspnea on exertion.  Dermatological: No rash, lesions/masses Respiratory: No cough, dyspnea Urologic: No hematuria, dysuria Abdominal:   No nausea, vomiting, diarrhea, bright red blood per rectum, melena, or hematemesis Neurologic:  No visual changes, wkns, changes in mental status. All other systems reviewed and are otherwise negative except as noted above.   Physical Exam:    VS:  BP 118/68   Pulse 67   Ht 5\' 5"  (1.651 m)   Wt (!) 305 lb (138.3 kg)   SpO2 97%   BMI 50.75 kg/m    General: Well developed, obese Caucasian male appearing in no acute distress. Head: Normocephalic, atraumatic, sclera non-icteric, no xanthomas, nares are without discharge.  Neck: No carotid bruits. JVD not elevated.  Lungs: Respirations regular and unlabored, without wheezes or rales.  Heart: Regular rate and rhythm. No S3 or S4.  No murmur, no rubs, or gallops  appreciated. Abdomen: Soft, non-tender, non-distended with normoactive bowel sounds. No hepatomegaly. No rebound/guarding. No obvious abdominal masses. Msk:  Strength and tone appear normal for age. No joint deformities or effusions. Extremities: No clubbing or cyanosis. No lower extremity edema.  Distal pedal pulses are 2+ bilaterally. Radial site stable without ecchymosis or evidence of hematoma.  Neuro: Alert and oriented X 3. Moves all extremities spontaneously. No focal deficits noted. Psych:  Responds to questions appropriately with a normal affect. Skin: No rashes or lesions noted  Wt Readings from Last 3 Encounters:  11/26/18 (!) 305 lb (138.3 kg)  10/29/18 299 lb (135.6 kg)  10/08/18 298 lb (135.2 kg)     Studies/Labs Reviewed:   EKG:  EKG is not ordered today.    Recent Labs: 09/09/2018: TSH 1.420 09/15/2018: ALT 24 09/16/2018: B Natriuretic Peptide 178.0; Hemoglobin 14.8; Platelets 163 09/17/2018: BUN 27; Creatinine, Ser 0.90; Potassium 4.2; Sodium 137   Lipid Panel    Component Value Date/Time   CHOL 116 09/09/2018 1010   TRIG 132 09/09/2018 1010   HDL 38 (L) 09/09/2018 1010   CHOLHDL 3.1 09/09/2018 1010   CHOLHDL 5.4 09/06/2017 1321   VLDL 24 09/06/2017 1321   [place] 52 09/09/2018 1010    Additional studies/ records that were reviewed today include:   Echocardiogram: 08/2018 Study Conclusions  - Left ventricle: The cavity size was normal. Wall thickness was   increased in a pattern of mild LVH. Systolic function was normal.   The estimated ejection fraction was in the range of 60% to 65%.   Wall motion was normal; there were no regional wall motion   abnormalities. Doppler parameters are consistent with abnormal   left ventricular relaxation (grade 1 diastolic dysfunction). - Aortic valve: Mildly calcified annulus. Trileaflet. - Mitral valve: There was trivial regurgitation. - Left atrium: The atrium was mildly dilated. - Right atrium: Central venous  pressure (est): 3 mm Hg. - Tricuspid valve: There was trivial regurgitation. - Pulmonary arteries: Systolic pressure could not be accurately   estimated. - Pericardium, extracardiac: There was no pericardial effusion.  Cardiac Catheterization: 10/08/2018  The left ventricular systolic function is normal.  LV end diastolic pressure is normal.  The left ventricular ejection fraction is 55-65% by visual estimate.  Hemodynamic findings consistent with mild pulmonary hypertension.   1. Normal coronary anatomy 2. Normal LV function 3. Normal LV filling pressures 4. Mild pulmonary HTN 5. Normal cardiac output.   Plan: medical management.   Assessment:    1. Dyspnea on exertion   2. Pulmonary hypertension ([place])   3. OSA (obstructive sleep apnea)   4. SVT (supraventricular tachycardia) ([place])   5. Morbid obesity ([place])   6. Tobacco abuse      Plan:   In order of problems listed above:  1. Dyspnea on Exertion - Cardiac catheterization was recently obtained for worsening dyspnea and episodes of chest pain which showed normal coronary anatomy as outlined above. Also noted to have normal LV function and LV filling pressures at that time. He denies any recurrent pain but does experience dyspnea on exertion. Suspect this is multifactorial in the setting of deconditioning, body habitus, and presumed COPD. No further cardiac testing indicated at this time. Does have follow-up with Pulmonology next week.   2. Pulmonary HTN/ OSA - he was noted to have mild pulmonary hypertension at the time of his cardiac catheterization as outlined above. He has PFT's scheduled for next week and reports he is now utilizing his CPAP on a more regular basis.   3. SVT - s/p ablation in 2015. He denies any recent palpitations and is maintaining normal sinus rhythm by examination today.  4. Morbid Obesity - BMI elevated to 50. He reports being active at his job but does not exercise regularly. He is trying  to consume healthier options at home and less late-night meals.   5. Tobacco Use - was previously smoking 2 ppd, now down to less than 1 ppd. Congratulated on reduction with cessation advised.    Medication Adjustments/Labs and Tests Ordered: Current medicines are reviewed at length with the patient today.  Concerns regarding medicines are outlined above.  Medication changes, Labs and Tests ordered today are listed in the Patient Instructions below. Patient Instructions  Medication Instructions:  Your physician recommends that you continue on your current medications as directed. Please refer to the Current Medication list given to you today.  If you need a refill on your cardiac medications before your next appointment, please call your pharmacy.   Lab work: NONE  If you have labs (blood work) drawn today and your tests are completely normal, you will receive your results only by: Marland Kitchen MyChart Message (if you have MyChart) OR . A paper copy in the mail If you have any lab test that is abnormal or we need to change your treatment, we will call you to review the results.  Testing/Procedures: NONE   Follow-Up: At [facility], you and your health needs are our priority.  As part of our continuing mission to provide you with exceptional heart care, we have created designated Provider Care Teams.  These Care Teams include your primary Cardiologist (physician) and Advanced Practice Providers (APPs -  Physician Assistants and Nurse Practitioners) who all work together to provide you with the care you need, when you need it. You will need a follow up appointment in 6 months.  Please call our office 2 months in advance to schedule this appointment.  You may see Dorris Carnes, MD or one of the following Advanced Practice Providers on your designated Care Team:   Bernerd Pho, PA-C (  [place]) . Ermalinda Barrios, PA-C ([place])  Any Other Special Instructions Will Be Listed Below  (If Applicable). Thank you for choosing [facility]!    Signed, Erma Heritage, PA-C  11/26/2018 5:07 PM    [facility]. [address] [place], [place] [address] Phone: [phone] Fax: [phone]

## 2018-11-26 NOTE — Patient Instructions (Signed)
Medication Instructions:  Your physician recommends that you continue on your current medications as directed. Please refer to the Current Medication list given to you today.  If you need a refill on your cardiac medications before your next appointment, please call your pharmacy.   Lab work: NONE  If you have labs (blood work) drawn today and your tests are completely normal, you will receive your results only by: . MyChart Message (if you have MyChart) OR . A paper copy in the mail If you have any lab test that is abnormal or we need to change your treatment, we will call you to review the results.  Testing/Procedures: NONE  Follow-Up: At [facility], you and your health needs are our priority.  As part of our continuing mission to provide you with exceptional heart care, we have created designated Provider Care Teams.  These Care Teams include your primary Cardiologist (physician) and Advanced Practice Providers (APPs -  Physician Assistants and Nurse Practitioners) who all work together to provide you with the care you need, when you need it. You will need a follow up appointment in 6 months.  Please call our office 2 months in advance to schedule this appointment.  You may see Paula Ross, MD or one of the following Advanced Practice Providers on your designated Care Team:   Brittany Strader, PA-C ([place] Office) . Michele Lenze, PA-C ([place] Office)  Any Other Special Instructions Will Be Listed Below (If Applicable). Thank you for choosing [facility]!     

## 2018-12-02 ENCOUNTER — Encounter: Payer: Self-pay | Admitting: Pulmonary Disease

## 2018-12-02 ENCOUNTER — Ambulatory Visit (INDEPENDENT_AMBULATORY_CARE_PROVIDER_SITE_OTHER): Payer: Self-pay | Admitting: Pulmonary Disease

## 2018-12-02 VITALS — BP 114/60 | HR 64 | Ht 64.0 in | Wt 303.0 lb

## 2018-12-02 DIAGNOSIS — G4733 Obstructive sleep apnea (adult) (pediatric): Secondary | ICD-10-CM

## 2018-12-02 DIAGNOSIS — R0609 Other forms of dyspnea: Secondary | ICD-10-CM

## 2018-12-02 DIAGNOSIS — Z6841 Body Mass Index (BMI) 40.0 and over, adult: Secondary | ICD-10-CM

## 2018-12-02 DIAGNOSIS — R5381 Other malaise: Secondary | ICD-10-CM

## 2018-12-02 LAB — PULMONARY FUNCTION TEST
DL/VA % pred: 94 %
DL/VA: 4.2 ml/min/mmHg/L
DLCO unc % pred: 97 %
DLCO unc: 22.76 ml/min/mmHg
FEF 25-75 PRE: 2.09 L/s
FEF 25-75 Post: 2.03 L/sec
FEF2575-%Change-Post: -2 %
FEF2575-%Pred-Post: 76 %
FEF2575-%Pred-Pre: 79 %
FEV1-%Change-Post: 0 %
FEV1-%Pred-Post: 92 %
FEV1-%Pred-Pre: 92 %
FEV1-POST: 2.78 L
FEV1-Pre: 2.78 L
FEV1FVC-%Change-Post: 6 %
FEV1FVC-%Pred-Pre: 96 %
FEV6-%Change-Post: -6 %
FEV6-%Pred-Post: 93 %
FEV6-%Pred-Pre: 99 %
FEV6-Post: 3.5 L
FEV6-Pre: 3.74 L
FEV6FVC-%Change-Post: 0 %
FEV6FVC-%PRED-PRE: 104 %
FEV6FVC-%Pred-Post: 104 %
FVC-%Change-Post: -6 %
FVC-%Pred-Post: 89 %
FVC-%Pred-Pre: 95 %
FVC-Post: 3.51 L
FVC-Pre: 3.74 L
POST FEV1/FVC RATIO: 79 %
Post FEV6/FVC ratio: 100 %
Pre FEV1/FVC ratio: 74 %
Pre FEV6/FVC Ratio: 100 %
RV % pred: 61 %
RV: 1.12 L
TLC % pred: 83 %
TLC: 4.83 L

## 2018-12-02 NOTE — Progress Notes (Signed)
[facility]  Chief Complaint  Patient presents with  . Follow-up    Breathing has been a little better since starting cpap machine.    Constitutional:  BP 114/60 (BP Location: Left Arm, Patient Position: Sitting, Cuff Size: Normal)   Pulse 64   Ht 5\' 4"  (1.626 m)   Wt (!) 303 lb (137.4 kg)   SpO2 96%   BMI 52.01 kg/m   Past Medical History:  SVT, Gallstone pancreatitis, HTN, HLD, Hep C, GERD  Brief Summary:  IVERY Ball is a 58 y.o. male smoker with OSA and dyspnea.  Since his last visit he was seen by cardiology.  Had Echo and heart cath.  Both unrevealing.  He had PFTs today. Normal.  Specifically, no COPD.  Hasn't noticed benefit from inhalers.  Still smokes, but gradually quitting.  Still gets winded with activity.  Not having cough, wheeze, chest pain.  Uses CPAP machine.  Had to buy machine on his own due to insurance issues.  Still has same mask from sleep lab.  Gets dry mouth and air leak.  CXR 09/15/18 - LUL granuloma (reviewed by me).  Physical Exam:   Appearance - well kempt   ENMT - clear nasal mucosa, midline nasal  septum, no oral exudates, no LAN, trachea midline  Respiratory - normal chest wall, normal respiratory effort, no accessory muscle use, no wheeze/rales  CV - s1s2 regular rate and rhythm, no murmurs, no peripheral edema, radial pulses symmetric  GI - soft, non tender, no masses  Lymph - no adenopathy noted in neck and axillary areas  MSK - normal gait  Ext - no cyanosis, clubbing, or joint inflammation noted  Skin - no rashes, lesions, or ulcers  Neuro - normal strength, oriented x 3  Psych - normal mood and affect  BMP Latest Ref Rng & Units 09/17/2018 09/16/2018 09/15/2018  Glucose 70 - 99 mg/dL 153(H) 260(H) 131(H)  BUN 6 - 20 mg/dL 27(H) 20 16  Creatinine 0.61 - 1.24 mg/dL 0.90 0.86 0.73  BUN/Creat Ratio 9 - 20 - - -  Sodium 135 - 145 mmol/L 137 135 135  Potassium 3.5 - 5.1  mmol/L 4.2 4.6 4.1  Chloride 98 - 111 mmol/L 102 101 105  CO2 22 - 32 mmol/L 29 25 26   Calcium 8.9 - 10.3 mg/dL 8.6(L) 9.0 8.8(L)    CBC Latest Ref Rng & Units 09/16/2018 09/15/2018 09/15/2018  WBC 4.0 - 10.5 K/uL 8.1 6.8 6.0  Hemoglobin 13.0 - 17.0 g/dL 14.8 13.9 14.1  Hematocrit 39.0 - 52.0 % 44.4 42.9 42.0  Platelets 150 - 400 K/uL 163 150 162    Assessment/Plan:   Obstructive sleep apnea. - he is compliant with CPAP and reports benefit from therapy - continue auto CPAP - he buys supplies on his own - he will try changing to full face mask  Dyspnea on exertion. - likely related to obesity and deconditioning - discussed options to assist with diet modification - discussed starting a gradual exercise regimen  Tobacco abuse. - encouraged him to keep up with gradual smoking cessation   Patient Instructions  Can go to CPAP.com to look up CPAP mask options  Keep up the good work on stopping cigarettes  Follow through with your plan to adjust your diet and exercise more  Can stop using spiriva and symbicort  Follow up in 1 year  A total of  27 minutes were spent face to face with the patient and  more than half of that time involved counseling or coordination of care.   Chesley Mires, MD [facility] Pager: [phone] 12/02/2018, 11:37 AM  Flow Sheet     Pulmonary tests:  PFT 12/02/18 >> FEV1 2.78 (92%), FEV1% 79, TLC 4.83 (83%), DLCO 97%  Chest imaging:  CT angio chest 04/28/17 >> calcified granuloma LUL  Sleep tests:  PSG 08/01/17 >> AHI 40.4, SpO2 low 74% Auto CPAP 11/02/18 to 12/01/18 >> used on 27 of 30 nights with average 4 hrs 34 min.  Average AHI 20.3 with median CPAP 7 and 95 th percentile CPAP 13 cm H2O.  Air leak  Cardiac tests:  Echo 09/16/18 >> EF 60 to 65%, mild LVH, grade 1 DD RHC/LHC 10/08/18 >> no CAD, mild pulmonary HTN   Medications:   Allergies as of 12/02/2018   No Known Allergies     Medication List       Accurate  as of December 02, 2018 11:37 AM. Always use your most recent med list.        acetaminophen 500 MG tablet Commonly known as:  TYLENOL Take 1 tablet (500 mg total) by mouth every 6 (six) hours as needed.   albuterol (2.5 MG/3ML) 0.083% nebulizer solution Commonly known as:  PROVENTIL Take 3 mLs (2.5 mg total) by nebulization every 6 (six) hours as needed for wheezing or shortness of breath.   aspirin EC 81 MG tablet Take 1 tablet (81 mg total) by mouth daily.   atorvastatin 40 MG tablet Commonly known as:  LIPITOR Take 1 tablet (40 mg total) by mouth daily at 6 PM.   cyclobenzaprine 10 MG tablet Commonly known as:  FLEXERIL Take 1 tablet (10 mg total) by mouth 2 (two) times daily as needed for muscle spasms.   doxazosin 1 MG tablet Commonly known as:  Cardura Take 1 tablet (1 mg total) by mouth daily.   NYQUIL PO Take 1 Dose by mouth at bedtime as needed (sleep).   omeprazole 40 MG capsule Commonly known as:  PRILOSEC Take 1 capsule (40 mg total) by mouth daily. TAKE ONE (1) CAPSULE EACH DAY       Past Surgical History:  He  has a past surgical history that includes JAW BROKEN; Cholecystectomy; LIPOMA REMOVALS; Colonoscopy (10/04/2012); Ablation (06-22-14); supraventricular tachycardia ablation (N/A, 06/22/2014); Esophagogastroduodenoscopy (N/A, 09/04/2014); Gallbladder surgery; and RIGHT/LEFT HEART CATH AND CORONARY ANGIOGRAPHY (N/A, 10/08/2018).  Family History:  His family history includes COPD in his mother; Diabetes in his father; Early death in his mother; Emphysema in his mother; High Cholesterol in his father; Hyperlipidemia in his father; Stroke in his father.  Social History:  He  reports that he has been smoking cigarettes. He has a 35.00 pack-year smoking history. He has never used smokeless tobacco. He reports that he does not drink alcohol or use drugs.

## 2018-12-02 NOTE — Patient Instructions (Signed)
Can go to CPAP.com to look up CPAP mask options  Keep up the good work on stopping cigarettes  Follow through with your plan to adjust your diet and exercise more  Can stop using spiriva and symbicort  Follow up in 1 year

## 2018-12-02 NOTE — Progress Notes (Signed)
PFT done today. 

## 2018-12-06 ENCOUNTER — Ambulatory Visit: Payer: Self-pay | Admitting: Family Medicine

## 2018-12-10 ENCOUNTER — Other Ambulatory Visit: Payer: Self-pay

## 2018-12-10 ENCOUNTER — Ambulatory Visit (INDEPENDENT_AMBULATORY_CARE_PROVIDER_SITE_OTHER): Payer: Self-pay | Admitting: Family Medicine

## 2018-12-10 ENCOUNTER — Encounter: Payer: Self-pay | Admitting: Family Medicine

## 2018-12-10 VITALS — BP 127/79 | HR 54 | Temp 97.8°F | Ht 64.0 in | Wt 296.0 lb

## 2018-12-10 DIAGNOSIS — G4733 Obstructive sleep apnea (adult) (pediatric): Secondary | ICD-10-CM

## 2018-12-10 DIAGNOSIS — I10 Essential (primary) hypertension: Secondary | ICD-10-CM

## 2018-12-10 DIAGNOSIS — K219 Gastro-esophageal reflux disease without esophagitis: Secondary | ICD-10-CM

## 2018-12-10 MED ORDER — OMEPRAZOLE 40 MG PO CPDR: 40.0000 mg | DELAYED_RELEASE_CAPSULE | Freq: Every day | ORAL | 3 refills | Status: DC

## 2018-12-10 NOTE — Progress Notes (Signed)
Subjective: CC: HTN, HLD, OSA, obesity PCP: Thomas Norlander, DO LKG:[id number] Thomas Ball is a 58 y.o. male presenting to clinic today for:  1. Hypertension with hyperlipidemia/obesity Patient reports compliance with his Cardura and Lipitor. No chest pain, shortness of breath, lightheadedness or visual disturbance.  He notes that he had a cardiac catheterization recently which was noted to be normal with mild pulmonary hypertension.  He has been following up with pulmonology had pulmonary function testing which she reports was normal and now has a CPAP machine.  He reports compliance and notes that the CPAP machine has definitely improved his sleep quality and energy.  He started to feel back to his normal self again.  He does note that the mask is poorly fitting at this point but he is saving up to buy a new one.  Additionally, he is working on weight loss with a successful 7 pound weight loss over the last week or so.  He has been reducing portion sizes and increasing frequency of eating.  He is trying to remain physically active but notes that the toes that he broke a while back was aggravated when he went back to work.  This was relieved by Motrin.  2. GERD Patient reports compliance with PPI.  He notes excellent control of acid reflux symptoms with the medication.  No melena, hematochezia or abdominal pain.  ROS: Per HPI  No Known Allergies Past Medical History:  Diagnosis Date  . GERD (gastroesophageal reflux disease)   . HCV (hepatitis C virus) DR. ZACKS   AUG 2013 VIRAL LOAD UNDETECTABLE  . History of cardiac catheterization    a. normal cors by cath in 09/2018 with mild pulmonary HTN  . Hypercholesteremia   . Hypertension   . Pancreatitis, gallstone   . 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 SVT (supraventricular tachycardia) (HCC)     Current Outpatient Medications:  .  albuterol (PROVENTIL) (2.5 MG/3ML) 0.083% nebulizer solution, Take 3 mLs (2.5 mg total) by  nebulization every 6 (six) hours as needed for wheezing or shortness of breath., Disp: 150 mL, Rfl: 0 .  aspirin EC 81 MG tablet, Take 1 tablet (81 mg total) by mouth daily. (Patient taking differently: Take 81 mg by mouth every evening. ), Disp: , Rfl:  .  atorvastatin (LIPITOR) 40 MG tablet, Take 1 tablet (40 mg total) by mouth daily at 6 PM., Disp: 90 tablet, Rfl: 3 .  doxazosin (CARDURA) 1 MG tablet, Take 1 tablet (1 mg total) by mouth daily., Disp: 90 tablet, Rfl: 2 .  omeprazole (PRILOSEC) 40 MG capsule, Take 1 capsule (40 mg total) by mouth daily. TAKE ONE (1) CAPSULE EACH DAY, Disp: 30 capsule, Rfl: 0 .  Pseudoeph-Doxylamine-DM-APAP (NYQUIL PO), Take 1 Dose by mouth at bedtime as needed (sleep)., Disp: , Rfl:  Social History   Socioeconomic History  . Marital status: Married    Spouse name: Not on file  . Number of children: Not on file  . Years of education: Not on file  . Highest education level: Not on file  Occupational History  . Occupation: concrete work    Fish farm manager: SELF-EMPLOYED  Social Needs  . Financial resource strain: Not on file  . Food insecurity:    Worry: Not on file    Inability: Not on file  . Transportation needs:    Medical: Not on file    Non-medical: Not on file  Tobacco Use  . Smoking status: Current Every  Day Smoker    Packs/day: 1.00    Years: 35.00    Pack years: 35.00    Types: Cigarettes  . Smokeless tobacco: Never Used  Substance and Sexual Activity  . Alcohol use: No  . Drug use: No    Comment: in the 1980s, smoke crack. None since Dec 2011.   Marland Kitchen Sexual activity: Not on file  Lifestyle  . Physical activity:    Days per week: Not on file    Minutes per session: Not on file  . Stress: Not on file  Relationships  . Social connections:    Talks on phone: Not on file    Gets together: Not on file    Attends religious service: Not on file    Active member of club or organization: Not on file    Attends meetings of clubs or organizations:  Not on file    Relationship status: Not on file  . Intimate partner violence:    Fear of current or ex partner: Not on file    Emotionally abused: Not on file    Physically abused: Not on file    Forced sexual activity: Not on file  Other Topics Concern  . Not on file  Social History Narrative  . Not on file   Family History  Problem Relation Age of Onset  . Emphysema Mother   . COPD Mother   . Early death Mother   . Hyperlipidemia Father   . Stroke Father   . Diabetes Father   . High Cholesterol Father   . Colon cancer Neg Hx     Objective: Office vital signs reviewed. BP 127/79   Pulse (!) 54   Temp 97.8 F (36.6 C) (Oral)   Ht 5\' 4"  (1.626 m)   Wt 296 lb (134.3 kg)   BMI 50.81 kg/m   Physical Examination:  General: Awake, alert, morbidly obese, No acute distress HEENT: Normal    Eyes: sclera white    Throat: moist mucus membranes, no erythema Cardio: regular rate and rhythm, S1S2 heard, no murmurs appreciated Pulm: clear to auscultation bilaterally, no wheezes, rhonchi or rales; normal work of breathing on room air Extremities: warm, well perfused, No edema, cyanosis or clubbing; +2 pulses bilaterally Psych: He seems much more alert and happier than previous visits.  Assessment/ Plan: 58 y.o. male   1. Essential hypertension Under excellent control.  No changes.  2. OSA (obstructive sleep apnea) Continue CPAP machine nightly.  Agree with obtaining a better fitting mask as I think this will improve his sleep.  3. Morbid obesity ([place]) Working on lifestyle modification with successful 7 pound weight loss.  Continue diet modification and increase physical activity.  Handouts provided with more instructions and resources.  4. Gastroesophageal reflux disease without esophagitis Refill prilosec.  Controlled.   Okay to follow-up in 6 months, sooner if needed.   No orders of the defined types were placed in this encounter.  Meds ordered this encounter   Medications  . omeprazole (PRILOSEC) 40 MG capsule    Sig: Take 1 capsule (40 mg total) by mouth daily. TAKE ONE (1) CAPSULE EACH DAY    Dispense:  90 capsule    Refill:  [facility] [place], [place] [phone]

## 2018-12-10 NOTE — Patient Instructions (Signed)
Exercising to Lose Weight Exercise is structured, repetitive physical activity to improve fitness and health. Getting regular exercise is important for everyone. It is especially important if you are overweight. Being overweight increases your risk of heart disease, stroke, diabetes, high blood pressure, and several types of cancer. Reducing your calorie intake and exercising can help you lose weight. Exercise is usually categorized as moderate or vigorous intensity. To lose weight, most people need to do a certain amount of moderate-intensity or vigorous-intensity exercise each week. Moderate-intensity exercise  Moderate-intensity exercise is any activity that gets you moving enough to burn at least three times more energy (calories) than if you were sitting. Examples of moderate exercise include:  Walking a mile in 15 minutes.  Doing light yard work.  Biking at an easy pace. Most people should get at least 150 minutes (2 hours and 30 minutes) a week of moderate-intensity exercise to maintain their body weight. Vigorous-intensity exercise Vigorous-intensity exercise is any activity that gets you moving enough to burn at least six times more calories than if you were sitting. When you exercise at this intensity, you should be working hard enough that you are not able to carry on a conversation. Examples of vigorous exercise include:  Running.  Playing a team sport, such as football, basketball, and soccer.  Jumping rope. Most people should get at least 75 minutes (1 hour and 15 minutes) a week of vigorous-intensity exercise to maintain their body weight. How can exercise affect me? When you exercise enough to burn more calories than you eat, you lose weight. Exercise also reduces body fat and builds muscle. The more muscle you have, the more calories you burn. Exercise also:  Improves mood.  Reduces stress and tension.  Improves your overall fitness, flexibility, and endurance.   Increases bone strength. The amount of exercise you need to lose weight depends on:  Your age.  The type of exercise.  Any health conditions you have.  Your overall physical ability. Talk to your health care provider about how much exercise you need and what types of activities are safe for you. What actions can I take to lose weight? Nutrition   Make changes to your diet as told by your health care provider or diet and nutrition specialist (dietitian). This may include: ? Eating fewer calories. ? Eating more protein. ? Eating less unhealthy fats. ? Eating a diet that includes fresh fruits and vegetables, whole grains, low-fat dairy products, and lean protein. ? Avoiding foods with added fat, salt, and sugar.  Drink plenty of water while you exercise to prevent dehydration or heat stroke. Activity  Choose an activity that you enjoy and set realistic goals. Your health care provider can help you make an exercise plan that works for you.  Exercise at a moderate or vigorous intensity most days of the week. ? The intensity of exercise may vary from person to person. You can tell how intense a workout is for you by paying attention to your breathing and heartbeat. Most people will notice their breathing and heartbeat get faster with more intense exercise.  Do resistance training twice each week, such as: ? Push-ups. ? Sit-ups. ? Lifting weights. ? Using resistance bands.  Getting short amounts of exercise can be just as helpful as long structured periods of exercise. If you have trouble finding time to exercise, try to include exercise in your daily routine. ? Get up, stretch, and walk around every 30 minutes throughout the day. ? Go for a   Getting short amounts of exercise can be just as helpful as long structured periods of exercise. If you have trouble finding time to exercise, try to include exercise in your daily routine.  ? Get up, stretch, and walk around every 30 minutes throughout the day.  ? Go for a walk during your lunch break.  ? Park your car farther away from your destination.  ? If you take public transportation, get off one stop early and walk the rest of the way.  ? Make phone calls while standing up and walking  around.  ? Take the stairs instead of elevators or escalators.   Wear comfortable clothes and shoes with good support.   Do not exercise so much that you hurt yourself, feel dizzy, or get very short of breath.  Where to find more information   [place] Department of Health and Human Services: www.hhs.gov   Centers for Disease Control and Prevention (CDC): www.cdc.gov  Contact a health care provider:   Before starting a new exercise program.   If you have questions or concerns about your weight.   If you have a medical problem that keeps you from exercising.  Get help right away if you have any of the following while exercising:   Injury.   Dizziness.   Difficulty breathing or shortness of breath that does not go away when you stop exercising.   Chest pain.   Rapid heartbeat.  Summary   Being overweight increases your risk of heart disease, stroke, diabetes, high blood pressure, and several types of cancer.   Losing weight happens when you burn more calories than you eat.   Reducing the amount of calories you eat in addition to getting regular moderate or vigorous exercise each week helps you lose weight.  This information is not intended to replace advice given to you by your health care provider. Make sure you discuss any questions you have with your health care provider.  Document Released: 10/14/2010 Document Revised: 09/24/2017 Document Reviewed: 09/24/2017  Elsevier Interactive Patient Education  2019 [employer]  Calorie Counting for Weight Loss  Calories are units of energy. Your body needs a certain amount of calories from food to keep you going throughout the day. When you eat more calories than your body needs, your body stores the extra calories as fat. When you eat fewer calories than your body needs, your body burns fat to get the energy it needs.  Calorie counting means keeping track of how many calories you eat and drink each day. Calorie counting can be helpful if you need to lose  weight. If you make sure to eat fewer calories than your body needs, you should lose weight. Ask your health care provider what a healthy weight is for you.  For calorie counting to work, you will need to eat the right number of calories in a day in order to lose a healthy amount of weight per week. A dietitian can help you determine how many calories you need in a day and will give you suggestions on how to reach your calorie goal.   A healthy amount of weight to lose per week is usually 1-2 lb (0.5-0.9 kg). This usually means that your daily calorie intake should be reduced by 500-750 calories.   Eating 1,200 - 1,500 calories per day can help most women lose weight.   Eating 1,500 - 1,800 calories per day can help most men lose weight.  What is my plan?  My goal is   to have __________ calories per day.  If I have this many calories per day, I should lose around __________ pounds per week.  What do I need to know about calorie counting?  In order to meet your daily calorie goal, you will need to:   Find out how many calories are in each food you would like to eat. Try to do this before you eat.   Decide how much of the food you plan to eat.   Write down what you ate and how many calories it had. Doing this is called keeping a food log.  To successfully lose weight, it is important to balance calorie counting with a healthy lifestyle that includes regular activity. Aim for 150 minutes of moderate exercise (such as walking) or 75 minutes of vigorous exercise (such as running) each week.  Where do I find calorie information?    The number of calories in a food can be found on a Nutrition Facts label. If a food does not have a Nutrition Facts label, try to look up the calories online or ask your dietitian for help.  Remember that calories are listed per serving. If you choose to have more than one serving of a food, you will have to multiply the calories per serving by the amount of servings you plan to eat. For  example, the label on a package of bread might say that a serving size is 1 slice and that there are 90 calories in a serving. If you eat 1 slice, you will have eaten 90 calories. If you eat 2 slices, you will have eaten 180 calories.  How do I keep a food log?  Immediately after each meal, record the following information in your food log:   What you ate. Don't forget to include toppings, sauces, and other extras on the food.   How much you ate. This can be measured in cups, ounces, or number of items.   How many calories each food and drink had.   The total number of calories in the meal.  Keep your food log near you, such as in a small notebook in your pocket, or use a mobile app or website. Some programs will calculate calories for you and show you how many calories you have left for the day to meet your goal.  What are some calorie counting tips?     Use your calories on foods and drinks that will fill you up and not leave you hungry:  ? Some examples of foods that fill you up are nuts and nut butters, vegetables, lean proteins, and high-fiber foods like whole grains. High-fiber foods are foods with more than 5 g fiber per serving.  ? Drinks such as sodas, specialty coffee drinks, alcohol, and juices have a lot of calories, yet do not fill you up.   Eat nutritious foods and avoid empty calories. Empty calories are calories you get from foods or beverages that do not have many vitamins or protein, such as candy, sweets, and soda. It is better to have a nutritious high-calorie food (such as an avocado) than a food with few nutrients (such as a bag of chips).   Know how many calories are in the foods you eat most often. This will help you calculate calorie counts faster.   Pay attention to calories in drinks. Low-calorie drinks include water and unsweetened drinks.   Pay attention to nutrition labels for "low fat" or "fat free" foods. These foods sometimes have   the same amount of calories or more calories  than the full fat versions. They also often have added sugar, starch, or salt, to make up for flavor that was removed with the fat.   Find a way of tracking calories that works for you. Get creative. Try different apps or programs if writing down calories does not work for you.  What are some portion control tips?   Know how many calories are in a serving. This will help you know how many servings of a certain food you can have.   Use a measuring cup to measure serving sizes. You could also try weighing out portions on a kitchen scale. With time, you will be able to estimate serving sizes for some foods.   Take some time to put servings of different foods on your favorite plates, bowls, and cups so you know what a serving looks like.   Try not to eat straight from a bag or box. Doing this can lead to overeating. Put the amount you would like to eat in a cup or on a plate to make sure you are eating the right portion.   Use smaller plates, glasses, and bowls to prevent overeating.   Try not to multitask (for example, watch TV or use your computer) while eating. If it is time to eat, sit down at a table and enjoy your food. This will help you to know when you are full. It will also help you to be aware of what you are eating and how much you are eating.  What are tips for following this plan?  Reading food labels   Check the calorie count compared to the serving size. The serving size may be smaller than what you are used to eating.   Check the source of the calories. Make sure the food you are eating is high in vitamins and protein and low in saturated and trans fats.  Shopping   Read nutrition labels while you shop. This will help you make healthy decisions before you decide to purchase your food.   Make a grocery list and stick to it.  Cooking   Try to cook your favorite foods in a healthier way. For example, try baking instead of frying.   Use low-fat dairy products.  Meal planning   Use more fruits  and vegetables. Half of your plate should be fruits and vegetables.   Include lean proteins like poultry and fish.  How do I count calories when eating out?   Ask for smaller portion sizes.   Consider sharing an entree and sides instead of getting your own entree.   If you get your own entree, eat only half. Ask for a box at the beginning of your meal and put the rest of your entree in it so you are not tempted to eat it.   If calories are listed on the menu, choose the lower calorie options.   Choose dishes that include vegetables, fruits, whole grains, low-fat dairy products, and lean protein.   Choose items that are boiled, broiled, grilled, or steamed. Stay away from items that are buttered, battered, fried, or served with cream sauce. Items labeled "crispy" are usually fried, unless stated otherwise.   Choose water, low-fat milk, unsweetened iced tea, or other drinks without added sugar. If you want an alcoholic beverage, choose a lower calorie option such as a glass of wine or light beer.   Ask for dressings, sauces, and syrups on the side. These   are usually high in calories, so you should limit the amount you eat.   If you want a salad, choose a garden salad and ask for grilled meats. Avoid extra toppings like bacon, cheese, or fried items. Ask for the dressing on the side, or ask for olive oil and vinegar or lemon to use as dressing.   Estimate how many servings of a food you are given. For example, a serving of cooked rice is  cup or about the size of half a baseball. Knowing serving sizes will help you be aware of how much food you are eating at restaurants. The list below tells you how big or small some common portion sizes are based on everyday objects:  ? 1 oz-4 stacked dice.  ? 3 oz-1 deck of cards.  ? 1 tsp-1 die.  ? 1 Tbsp- a ping-pong ball.  ? 2 Tbsp-1 ping-pong ball.  ?  cup- baseball.  ? 1 cup-1 baseball.  Summary   Calorie counting means keeping track of how many calories you eat  and drink each day. If you eat fewer calories than your body needs, you should lose weight.   A healthy amount of weight to lose per week is usually 1-2 lb (0.5-0.9 kg). This usually means reducing your daily calorie intake by 500-750 calories.   The number of calories in a food can be found on a Nutrition Facts label. If a food does not have a Nutrition Facts label, try to look up the calories online or ask your dietitian for help.   Use your calories on foods and drinks that will fill you up, and not on foods and drinks that will leave you hungry.   Use smaller plates, glasses, and bowls to prevent overeating.  This information is not intended to replace advice given to you by your health care provider. Make sure you discuss any questions you have with your health care provider.  Document Released: 09/11/2005 Document Revised: 05/31/2018 Document Reviewed: 08/11/2016  [employer] Interactive Patient Education  2019 [employer]

## 2018-12-31 ENCOUNTER — Ambulatory Visit: Payer: Self-pay | Admitting: Internal Medicine

## 2019-02-06 ENCOUNTER — Telehealth: Payer: Self-pay | Admitting: Family Medicine

## 2019-06-09 ENCOUNTER — Other Ambulatory Visit: Payer: Self-pay | Admitting: Family Medicine

## 2019-10-01 ENCOUNTER — Other Ambulatory Visit: Payer: Self-pay | Admitting: Family Medicine

## 2020-01-06 ENCOUNTER — Ambulatory Visit: Payer: Self-pay | Admitting: Family Medicine

## 2020-01-12 ENCOUNTER — Encounter: Payer: Self-pay | Admitting: Family Medicine

## 2020-01-12 ENCOUNTER — Telehealth (INDEPENDENT_AMBULATORY_CARE_PROVIDER_SITE_OTHER): Payer: Self-pay | Admitting: Family Medicine

## 2020-01-12 VITALS — BP 130/82

## 2020-01-12 DIAGNOSIS — I10 Essential (primary) hypertension: Secondary | ICD-10-CM

## 2020-01-12 DIAGNOSIS — R351 Nocturia: Secondary | ICD-10-CM

## 2020-01-12 DIAGNOSIS — K7469 Other cirrhosis of liver: Secondary | ICD-10-CM

## 2020-01-12 DIAGNOSIS — N401 Enlarged prostate with lower urinary tract symptoms: Secondary | ICD-10-CM

## 2020-01-12 DIAGNOSIS — E7800 Pure hypercholesterolemia, unspecified: Secondary | ICD-10-CM | POA: Insufficient documentation

## 2020-01-12 MED ORDER — OMEPRAZOLE 40 MG PO CPDR: 40.0000 mg | DELAYED_RELEASE_CAPSULE | Freq: Every day | ORAL | 1 refills | Status: DC

## 2020-01-12 MED ORDER — ATORVASTATIN CALCIUM 40 MG PO TABS: 40.0000 mg | ORAL_TABLET | Freq: Every day | ORAL | 1 refills | Status: DC

## 2020-01-12 MED ORDER — DOXAZOSIN MESYLATE 1 MG PO TABS: 1.0000 mg | ORAL_TABLET | Freq: Every day | ORAL | 1 refills | Status: DC

## 2020-01-12 NOTE — Progress Notes (Signed)
Telephone visit  Subjective: CC: med refills PCP: [name], DO Thomas Ball is a 59 y.o. male. Patient provides verbal consent for consult held via phone.  Video visit was attempted but patient was having technical difficulty so it was switched to telephone visit.  Due to COVID-19 pandemic this visit was conducted virtually. This visit type was conducted due to national recommendations for restrictions regarding the COVID-19 Pandemic (e.g. social distancing, sheltering in place) in an effort to limit this patient's exposure and mitigate transmission in our community. All issues noted in this document were discussed and addressed.  A physical exam was not performed with this format.   Location of patient: home Location of provider: [facility] Others present for call: none  1. HLD/ HTN Patient reports compliance with cardura 39m, atorvastatin 40 mg daily.  He does not report any chest pain, shortness of breath.  He has been monitoring his blood pressure and he did have 1 outlier at 155/95.  The remainders of his blood pressures have been normal with last blood pressure of 130/82.  2.  BPH Patient reports compliance with doxazosin.  He needs refills on this.  Does not report any nocturia.  ROS: Per HPI  No Known Allergies Past Medical History:  Diagnosis Date  . GERD (gastroesophageal reflux disease)   . HCV (hepatitis C virus) DR. ZACKS   AUG 2013 VIRAL LOAD UNDETECTABLE  . History of cardiac catheterization    a. normal cors by cath in 09/2018 with mild pulmonary HTN  . Hypercholesteremia   . Hypertension   . Pancreatitis, gallstone   . 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 SVT (supraventricular tachycardia) (HCC)     Current Outpatient Medications:  .  albuterol (PROVENTIL) (2.5 MG/3ML) 0.083% nebulizer solution, Take 3 mLs (2.5 mg total) by nebulization every 6 (six) hours as needed for wheezing or shortness of breath., Disp: 150 mL, Rfl: 0 .  aspirin EC  81 MG tablet, Take 1 tablet (81 mg total) by mouth daily. (Patient taking differently: Take 81 mg by mouth every evening. ), Disp: , Rfl:  .  atorvastatin (LIPITOR) 40 MG tablet, Take 1 tablet (40 mg total) by mouth daily at 6 PM. (Needs to be seen before next refill), Disp: 30 tablet, Rfl: 0 .  doxazosin (CARDURA) 1 MG tablet, Take 1 tablet (1 mg total) by mouth daily., Disp: 90 tablet, Rfl: 2 .  omeprazole (PRILOSEC) 40 MG capsule, Take 1 capsule (40 mg total) by mouth daily. TAKE ONE (1) CAPSULE EACH DAY, Disp: 90 capsule, Rfl: 3 .  Pseudoeph-Doxylamine-DM-APAP (NYQUIL PO), Take 1 Dose by mouth at bedtime as needed (sleep)., Disp: , Rfl:   Assessment/ Plan: 59y.o. male   1. Essential hypertension Controlled.  I suspect the outlier was likely due to not taking BP appropriately.  We discussed monitoring this closely and if he continues to have elevations above goal he is to contact me immediately.  Otherwise okay to follow-up in 6 months - CMP14+EGFR; Future  2. Pure hypercholesterolemia He will come in for fasting lipid.  Med sent - CMP14+EGFR; Future - Lipid panel; Future - TSH; Future - atorvastatin (LIPITOR) 40 MG tablet; Take 1 tablet (40 mg total) by mouth daily at 6 PM.  Dispense: 90 tablet; Refill: 1  3. Other cirrhosis of liver ([facility] - CMP14+EGFR; Future  4. Benign prostatic hyperplasia with nocturia Stable.  Continue Cardura - doxazosin (CARDURA) 1 MG tablet; Take 1 tablet (1  mg total) by mouth daily.  Dispense: 90 tablet; Refill: 1 - PSA; Future   Start time: 8:57am (text sent); 9:02 (called> second text sent); 9:03am (phone called again; patient having tech difficulties with video) End time: 9:09am  Total time spent on patient care (including video visit/ documentation): 14 minutes  [place], [place] [phone]

## 2020-01-22 ENCOUNTER — Ambulatory Visit: Payer: Self-pay | Admitting: Gastroenterology

## 2020-01-22 ENCOUNTER — Other Ambulatory Visit: Payer: Self-pay

## 2020-01-22 DIAGNOSIS — E7800 Pure hypercholesterolemia, unspecified: Secondary | ICD-10-CM

## 2020-01-22 DIAGNOSIS — I10 Essential (primary) hypertension: Secondary | ICD-10-CM

## 2020-01-22 DIAGNOSIS — N401 Enlarged prostate with lower urinary tract symptoms: Secondary | ICD-10-CM

## 2020-01-22 DIAGNOSIS — K7469 Other cirrhosis of liver: Secondary | ICD-10-CM

## 2020-01-23 LAB — CMP14+EGFR
ALT: 22 IU/L (ref 0–44)
AST: 19 IU/L (ref 0–40)
Albumin/Globulin Ratio: 2 (ref 1.2–2.2)
Albumin: 4.4 g/dL (ref 3.8–4.9)
Alkaline Phosphatase: 34 IU/L — ABNORMAL LOW (ref 39–117)
BUN/Creatinine Ratio: 17 (ref 9–20)
BUN: 16 mg/dL (ref 6–24)
Bilirubin Total: 1 mg/dL (ref 0.0–1.2)
CO2: 24 mmol/L (ref 20–29)
Calcium: 9.1 mg/dL (ref 8.7–10.2)
Chloride: 102 mmol/L (ref 96–106)
Creatinine, Ser: 0.96 mg/dL (ref 0.76–1.27)
GFR calc Af Amer: 101 mL/min/{1.73_m2} (ref >59)
GFR calc non Af Amer: 87 mL/min/{1.73_m2} (ref >59)
Globulin, Total: 2.2 g/dL (ref 1.5–4.5)
Glucose: 131 mg/dL — ABNORMAL HIGH (ref 65–99)
Potassium: 4.2 mmol/L (ref 3.5–5.2)
Sodium: 138 mmol/L (ref 134–144)
Total Protein: 6.6 g/dL (ref 6.0–8.5)

## 2020-01-23 LAB — LIPID PANEL
Chol/HDL Ratio: 2.8 ratio (ref 0.0–5.0)
Cholesterol, Total: 94 mg/dL — ABNORMAL LOW (ref 100–199)
HDL: 33 mg/dL — ABNORMAL LOW (ref >39)
LDL Chol Calc (NIH): 45 mg/dL (ref 0–99)
Triglycerides: 73 mg/dL (ref 0–149)
VLDL Cholesterol Cal: 16 mg/dL (ref 5–40)

## 2020-01-23 LAB — PSA: Prostate Specific Ag, Serum: 1.3 ng/mL (ref 0.0–4.0)

## 2020-01-23 LAB — TSH: TSH: 1.02 u[IU]/mL (ref 0.450–4.500)

## 2020-01-29 ENCOUNTER — Ambulatory Visit (INDEPENDENT_AMBULATORY_CARE_PROVIDER_SITE_OTHER): Payer: Self-pay | Admitting: Internal Medicine

## 2020-01-29 ENCOUNTER — Other Ambulatory Visit: Payer: Self-pay

## 2020-01-29 ENCOUNTER — Encounter: Payer: Self-pay | Admitting: Internal Medicine

## 2020-01-29 VITALS — BP 134/78 | HR 66 | Temp 97.3°F | Ht 66.0 in | Wt 301.0 lb

## 2020-01-29 DIAGNOSIS — R0609 Other forms of dyspnea: Secondary | ICD-10-CM

## 2020-01-29 DIAGNOSIS — R0600 Dyspnea, unspecified: Secondary | ICD-10-CM

## 2020-01-29 NOTE — Patient Instructions (Signed)
Medication Instructions: Your physician recommends that you continue on your current medications as directed. Please refer to the Current Medication list given to you today.   Labwork: None today  Procedures/Testing: None today  Follow-Up: 1 year office visit with Dr.Taylor  Any Additional Special Instructions Will Be Listed Below (If Applicable).     If you need a refill on your cardiac medications before your next appointment, please call your pharmacy.      Thank you for choosing [facility] !

## 2020-01-29 NOTE — Progress Notes (Signed)
HPI [name] returns today for followup. He is a pleasant morbidly obese 59 yo man with SVT and preserved LV function s/p catheterization. In the interim he has had dyspnea. He has no obstructive CAD by heart cath. He has been unable to lose weight. He has had PFTs which were described as normal. He has mild pulmonary HTN. He is frustrated by his inability to lose weight.  No Known Allergies   Current Outpatient Medications  Medication Sig Dispense Refill  . albuterol (PROVENTIL) (2.5 MG/3ML) 0.083% nebulizer solution Take 3 mLs (2.5 mg total) by nebulization every 6 (six) hours as needed for wheezing or shortness of breath. 150 mL 0  . aspirin EC 81 MG tablet Take 1 tablet (81 mg total) by mouth daily. (Patient taking differently: Take 81 mg by mouth every evening. )    . atorvastatin (LIPITOR) 40 MG tablet Take 1 tablet (40 mg total) by mouth daily at 6 PM. 90 tablet 1  . doxazosin (CARDURA) 1 MG tablet Take 1 tablet (1 mg total) by mouth daily. 90 tablet 1  . omeprazole (PRILOSEC) 40 MG capsule Take 1 capsule (40 mg total) by mouth daily. TAKE ONE (1) CAPSULE EACH DAY 90 capsule 1   No current facility-administered medications for this visit.     Past Medical History:  Diagnosis Date  . GERD (gastroesophageal reflux disease)   . HCV (hepatitis C virus) DR. ZACKS   AUG 2013 VIRAL LOAD UNDETECTABLE  . History of cardiac catheterization    a. normal cors by cath in 09/2018 with mild pulmonary HTN  . Hypercholesteremia   . Hypertension   . Pancreatitis, gallstone   . 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 SVT (supraventricular tachycardia) (HCC)     ROS:   All systems reviewed and negative except as noted in the HPI.   Past Surgical History:  Procedure Laterality Date  . 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 CHOLECYSTECTOMY    . COLONOSCOPY  10/04/2012   SLF:ONE/8 SIMPLE ADENOMA, TICS, SML IH  . ESOPHAGOGASTRODUODENOSCOPY N/A 09/04/2014   Procedure: ESOPHAGOGASTRODUODENOSCOPY (EGD);  Surgeon: Danie Binder, MD;  Location: [facility];  Service: Endoscopy;  Laterality: N/A;  145  . GALLBLADDER SURGERY    . JAW BROKEN    . LIPOMA REMOVALS     SEVERAL  . RIGHT/LEFT HEART CATH AND CORONARY ANGIOGRAPHY N/A 10/08/2018   Procedure: RIGHT/LEFT HEART CATH AND CORONARY ANGIOGRAPHY;  Surgeon: Martinique, Peter M, MD;  Location: [facility];  Service: Cardiovascular;  Laterality: N/A;  . SUPRAVENTRICULAR TACHYCARDIA ABLATION N/A 06/22/2014   Procedure: SUPRAVENTRICULAR TACHYCARDIA ABLATION;  Surgeon: Evans Lance, MD;  Location: [facility] LAB;  Service: Cardiovascular;  Laterality: N/A;     Family History  Problem Relation Age of Onset  . Emphysema Mother   . COPD Mother   . Early death Mother   . Hyperlipidemia Father   . Stroke Father   . Diabetes Father   . High Cholesterol Father   . Colon cancer Neg Hx      Social History   Socioeconomic History  . Marital status: Married    Spouse name: Not on file  . Number of children: Not on file  . Years of education: Not on file  . Highest education level: Not on file  Occupational History  . Occupation: concrete work    Fish farm manager: SELF-EMPLOYED  Tobacco Use  .  Smoking status: Current Every Day Smoker    Packs/day: 1.00    Years: 35.00    Pack years: 35.00    Types: Cigarettes  . Smokeless tobacco: Never Used  Substance and Sexual Activity  . Alcohol use: No  . Drug use: No    Comment: in the 1980s, smoke crack. None since Dec 2011.   Marland Kitchen Sexual activity: Not on file  Other Topics Concern  . Not on file  Social History Narrative  . Not on file   Social Determinants of Health   Financial Resource Strain:   . Difficulty of Paying Living Expenses:   Food Insecurity:   . Worried About Charity fundraiser in the Last Year:   . Arboriculturist in the Last Year:   Transportation Needs:   . Film/video editor (Medical):   Marland Kitchen Lack of Transportation  (Non-Medical):   Physical Activity:   . Days of Exercise per Week:   . Minutes of Exercise per Session:   Stress:   . Feeling of Stress :   Social Connections:   . Frequency of Communication with Friends and Family:   . Frequency of Social Gatherings with Friends and Family:   . Attends Religious Services:   . Active Member of Clubs or Organizations:   . Attends Archivist Meetings:   Marland Kitchen Marital Status:   Intimate Partner Violence:   . Fear of Current or Ex-Partner:   . Emotionally Abused:   Marland Kitchen Physically Abused:   . Sexually Abused:      BP 134/78   Pulse 66   Temp (!) 97.3 F (36.3 C)   Ht 5\' 6"  (1.676 m)   Wt (!) 301 lb (136.5 kg)   SpO2 94%   BMI 48.58 kg/m   Physical Exam:  Well appearing NAD HEENT: Unremarkable Neck:  No JVD, no thyromegally Lymphatics:  No adenopathy Back:  No CVA tenderness Lungs:  Clear HEART:  Regular rate rhythm, no murmurs, no rubs, no clicks Abd:  soft, positive bowel sounds, no organomegally, no rebound, no guarding Ext:  2 plus pulses, no edema, no cyanosis, no clubbing Skin:  No rashes no nodules Neuro:  CN II through XII intact, motor grossly intact  Assess/Plan: 1. Dyspnea - I suspect that this is a consequence of morbid obesity and he is encouraged to lose weight. 2. Palpitations - these are short lived and not at all like his SVT. If they continue he will need to wear a cardiac monitor.  3. Chest pain - he has no obstructive CAD by heart cath. 4. mobid obesity - he is encouraged to lose weight.   Mikle Bosworth.D.

## 2020-02-09 ENCOUNTER — Encounter: Payer: Self-pay | Admitting: Family Medicine

## 2020-02-09 ENCOUNTER — Ambulatory Visit (INDEPENDENT_AMBULATORY_CARE_PROVIDER_SITE_OTHER): Payer: Self-pay | Admitting: Family Medicine

## 2020-02-09 DIAGNOSIS — J40 Bronchitis, not specified as acute or chronic: Secondary | ICD-10-CM

## 2020-02-09 MED ORDER — PREDNISONE 20 MG PO TABS: ORAL_TABLET | ORAL | 0 refills | Status: DC

## 2020-02-09 MED ORDER — AZITHROMYCIN 250 MG PO TABS: ORAL_TABLET | ORAL | 0 refills | Status: DC

## 2020-02-09 NOTE — Progress Notes (Signed)
Virtual Visit via telephone Note  I connected with Thomas Ball on 02/09/20 at 1109 by telephone and verified that I am speaking with the correct person using two identifiers. Thomas Ball is currently located at home and no other people are currently with her during visit. The provider, Fransisca Kaufmann Sameen Leas, MD is located in their office at time of visit.  Call ended at 1116  I discussed the limitations, risks, security and privacy concerns of performing an evaluation and management service by telephone and the availability of in person appointments. I also discussed with the patient that there may be a patient responsible charge related to this service. The patient expressed understanding and agreed to proceed.   History and Present Illness: Patient is calling in for chest congestion and tightness and feels like he has bronchitis.  He has been having for 2 weeks.  It is worse at night.  He has a lot of coughing and some wheezing.  He denies fevers or chills.  His wife is not ill. He has been using nebulizer and it is helping some.  He does smoke but is looking to quit since this started.   1. Bronchitis     Outpatient Encounter Medications as of 02/09/2020  Medication Sig  . albuterol (PROVENTIL) (2.5 MG/3ML) 0.083% nebulizer solution Take 3 mLs (2.5 mg total) by nebulization every 6 (six) hours as needed for wheezing or shortness of breath.  Marland Kitchen aspirin EC 81 MG tablet Take 1 tablet (81 mg total) by mouth daily. (Patient taking differently: Take 81 mg by mouth every evening. )  . atorvastatin (LIPITOR) 40 MG tablet Take 1 tablet (40 mg total) by mouth daily at 6 PM.  . azithromycin (ZITHROMAX) 250 MG tablet Take 2 the first day and then one each day after.  . doxazosin (CARDURA) 1 MG tablet Take 1 tablet (1 mg total) by mouth daily.  Marland Kitchen omeprazole (PRILOSEC) 40 MG capsule Take 1 capsule (40 mg total) by mouth daily. TAKE ONE (1) CAPSULE EACH DAY  . predniSONE (DELTASONE) 20 MG tablet 2  po at same time daily for 5 days   No facility-administered encounter medications on file as of 02/09/2020.    Review of Systems  Constitutional: Negative for chills and fever.  HENT: Positive for congestion. Negative for ear discharge, ear pain, postnasal drip, rhinorrhea, sinus pressure, sneezing, sore throat and voice change.   Eyes: Negative for pain, discharge, redness and visual disturbance.  Respiratory: Positive for cough, chest tightness and wheezing. Negative for shortness of breath.   Cardiovascular: Negative for chest pain and leg swelling.  Musculoskeletal: Negative for back pain, gait problem and myalgias.  Skin: Negative for rash.  All other systems reviewed and are negative.   Observations/Objective: Patient has a cough on the phone but otherwise sounds comfortable  Assessment and Plan: Problem List Items Addressed This Visit    None    Visit Diagnoses    Bronchitis    -  Primary   Relevant Medications   azithromycin (ZITHROMAX) 250 MG tablet   predniSONE (DELTASONE) 20 MG tablet      Likely some early or mild chronic bronchitis that he gets these recurrent flares, although he did have lung testing last year was told that he did not have full-blown COPD at this point.  We will treat with azithromycin and prednisone and discussed smoking cessation and he will try. Follow up plan: Return if symptoms worsen or fail to improve.  I discussed the assessment and treatment plan with the patient. The patient was provided an opportunity to ask questions and all were answered. The patient agreed with the plan and demonstrated an understanding of the instructions.   The patient was advised to call back or seek an in-person evaluation if the symptoms worsen or if the condition fails to improve as anticipated.  The above assessment and management plan was discussed with the patient. The patient verbalized understanding of and has agreed to the management plan. Patient is  aware to call the clinic if symptoms persist or worsen. Patient is aware when to return to the clinic for a follow-up visit. Patient educated on when it is appropriate to go to the emergency department.    I provided 7 minutes of non-face-to-face time during this encounter.    Worthy Rancher, MD

## 2020-02-16 ENCOUNTER — Telehealth: Payer: Self-pay | Admitting: Family Medicine

## 2020-02-16 NOTE — Telephone Encounter (Signed)
Pt advised that even though he has completed the antibiotic it is still in his system so he should treat the symptoms with OTC medications, hydrate  and monitor symptoms. If he gets worse or doesn't get better in the next few days to call back. Pt voiced understanding.

## 2020-04-05 ENCOUNTER — Other Ambulatory Visit: Payer: Self-pay

## 2020-04-05 ENCOUNTER — Encounter: Payer: Self-pay | Admitting: Physician Assistant

## 2020-04-05 ENCOUNTER — Ambulatory Visit (INDEPENDENT_AMBULATORY_CARE_PROVIDER_SITE_OTHER): Payer: Self-pay | Admitting: Physician Assistant

## 2020-04-05 VITALS — BP 139/74 | HR 49 | Temp 97.5°F | Ht 66.0 in | Wt 310.0 lb

## 2020-04-05 DIAGNOSIS — K047 Periapical abscess without sinus: Secondary | ICD-10-CM

## 2020-04-05 MED ORDER — CEPHALEXIN 500 MG PO CAPS: 500.0000 mg | ORAL_CAPSULE | Freq: Two times a day (BID) | ORAL | 0 refills | Status: DC

## 2020-04-05 NOTE — Progress Notes (Signed)
  Subjective:     Patient ID: Thomas Ball, male   DOB: Sep 24, 1961, 59 y.o.   MRN: [id number]  HPI Pt with pain to the R lower jaw area + Hx of tooth abscess Pt has not been to the dentist Using OTC meds for sx  Review of Systems  Constitutional: Negative.   HENT: Positive for dental problem. Negative for drooling, ear pain and facial swelling.        Objective:   Physical Exam Vitals and nursing note reviewed.  Constitutional:      General: He is not in acute distress.    Appearance: Normal appearance. He is not ill-appearing or toxic-appearing.  Cardiovascular:     Rate and Rhythm: Normal rate and regular rhythm.     Heart sounds: Normal heart sounds.  Pulmonary:     Effort: Pulmonary effort is normal.     Breath sounds: Normal breath sounds.  Musculoskeletal:     Cervical back: No rigidity.  Lymphadenopathy:     Cervical: No cervical adenopathy.  Neurological:     Mental Status: He is alert.   Oral- multiple broken teeth with sig caries to the R lower jaw and L upper jaw area     Assessment:     1. Tooth abscess        Plan:     Warm salt water gargles Keflex 500mg  bid x 10 days rx OTC meds for sx F/U with dentist

## 2020-04-05 NOTE — Patient Instructions (Signed)
Dental Abscess  A dental abscess is an area of pus in or around a tooth. It comes from an infection. It can cause pain and other symptoms. Treatment will help with symptoms and prevent the infection from spreading. Follow these instructions at home: Medicines  Take over-the-counter and prescription medicines only as told by your dentist.  If you were prescribed an antibiotic medicine, take it as told by your dentist. Do not stop taking it even if you start to feel better.  If you were prescribed a gel that has numbing medicine in it, use it exactly as told.  Do not drive or use heavy machinery (like a lawn mower) while taking prescription pain medicine. General instructions  Rinse out your mouth often with salt water. ? To make salt water, dissolve -1 tsp of salt in 1 cup of warm water.  Eat a soft diet while your mouth is healing.  Drink enough fluid to keep your urine pale yellow.  Do not apply heat to the outside of your mouth.  Do not use any products that contain nicotine or tobacco. These include cigarettes and e-cigarettes. If you need help quitting, ask your doctor.  Keep all follow-up visits as told by your dentist. This is important. Prevent an abscess  Brush your teeth every morning and every night. Use fluoride toothpaste.  Floss your teeth each day.  Get dental cleanings as often as told by your dentist.  Think about getting dental sealant put on teeth that have deep holes (decay).  Drink water that has fluoride in it. ? Most tap water has fluoride. ? Check the label on bottled water to see if it has fluoride in it.  Drink water instead of sugary drinks.  Eat healthy meals and snacks.  Wear a mouth guard or face shield when you play sports. Contact a doctor if:  Your pain is worse, and medicine does not help. Get help right away if:  You have a fever or chills.  Your symptoms suddenly get worse.  You have a very bad headache.  You have problems  breathing or swallowing.  You have trouble opening your mouth.  You have swelling in your neck or close to your eye. Summary  A dental abscess is an area of pus in or around a tooth. It is caused by an infection.  Treatment will help with symptoms and prevent the infection from spreading.  Take over-the-counter and prescription medicines only as told by your dentist.  To prevent an abscess, take good care of your teeth. Brush your teeth every morning and night. Use floss every day.  Get dental cleanings as often as told by your dentist. This information is not intended to replace advice given to you by your health care provider. Make sure you discuss any questions you have with your health care provider. Document Revised: 01/01/2019 Document Reviewed: 05/14/2017 Elsevier Patient Education  2020 Elsevier Inc.  

## 2020-05-13 ENCOUNTER — Other Ambulatory Visit: Payer: Self-pay

## 2020-05-13 ENCOUNTER — Encounter: Payer: Self-pay | Admitting: Family Medicine

## 2020-05-13 ENCOUNTER — Ambulatory Visit (INDEPENDENT_AMBULATORY_CARE_PROVIDER_SITE_OTHER): Payer: Self-pay | Admitting: Family Medicine

## 2020-05-13 VITALS — BP 120/79 | HR 69 | Temp 98.6°F | Ht 66.0 in | Wt 300.0 lb

## 2020-05-13 DIAGNOSIS — R399 Unspecified symptoms and signs involving the genitourinary system: Secondary | ICD-10-CM

## 2020-05-13 DIAGNOSIS — R339 Retention of urine, unspecified: Secondary | ICD-10-CM

## 2020-05-13 LAB — MICROSCOPIC EXAMINATION: Epithelial Cells (non renal): NONE SEEN /hpf (ref 0–10)

## 2020-05-13 LAB — URINALYSIS, COMPLETE
Bilirubin, UA: NEGATIVE
Glucose, UA: NEGATIVE
Nitrite, UA: NEGATIVE
Specific Gravity, UA: >1.03 — ABNORMAL HIGH (ref 1.005–1.030)
Urobilinogen, Ur: 1 mg/dL (ref 0.2–1.0)
pH, UA: 7 (ref 5.0–7.5)

## 2020-05-13 MED ORDER — CEPHALEXIN 500 MG PO CAPS: 500.0000 mg | ORAL_CAPSULE | Freq: Four times a day (QID) | ORAL | 0 refills | Status: DC

## 2020-05-13 NOTE — Progress Notes (Signed)
BP 120/79   Pulse 69   Temp 98.6 F (37 C)   Ht 5\' 6"  (1.676 m)   Wt 300 lb (136.1 kg)   SpO2 93%   BMI 48.42 kg/m    Subjective:   Patient ID: Thomas Ball, male    DOB: 07-Jun-1961, 59 y.o.   MRN: [id number]  HPI: Thomas Ball is a 59 y.o. male presenting on 05/13/2020 for Urinary Tract Infection (dysuria)   HPI UTI symptoms and dysuria and urinary retention. Patient is coming in today with urinary symptoms, he says he has difficulty starting his stream and difficulty emptying his stream and he feels like he is retaining and he has to go multiple times a day and he just cannot get it to come out.  He denies any burning or blood but just has a lot of issues trying to get it out.  He has been taking the doxazosin which was prescribed to try and help with this but it does not seem to be helping any further.  Patient denies any fevers or chills or flank pain or abdominal pain.  Relevant past medical, surgical, family and social history reviewed and updated as indicated. Interim medical history since our last visit reviewed. Allergies and medications reviewed and updated.  Review of Systems  Constitutional: Negative for chills and fever.  Gastrointestinal: Negative for abdominal pain, nausea and vomiting.  Genitourinary: Positive for dysuria, frequency and urgency. Negative for decreased urine volume and hematuria.  Musculoskeletal: Negative for back pain and gait problem.  Skin: Negative for rash.  All other systems reviewed and are negative.   Per HPI unless specifically indicated above   Allergies as of 05/13/2020   No Known Allergies     Medication List       Accurate as of May 13, 2020  2:52 PM. If you have any questions, ask your nurse or doctor.        STOP taking these medications   azithromycin 250 MG tablet Commonly known as: ZITHROMAX Stopped by: Fransisca Kaufmann Calea Hribar, MD   cephALEXin 500 MG capsule Commonly known as: KEFLEX Stopped by: Fransisca Kaufmann  Aeisha Minarik, MD   predniSONE 20 MG tablet Commonly known as: DELTASONE Stopped by: Fransisca Kaufmann Pius Byrom, MD     TAKE these medications   albuterol (2.5 MG/3ML) 0.083% nebulizer solution Commonly known as: PROVENTIL Take 3 mLs (2.5 mg total) by nebulization every 6 (six) hours as needed for wheezing or shortness of breath.   aspirin EC 81 MG tablet Take 1 tablet (81 mg total) by mouth daily. What changed: when to take this   atorvastatin 40 MG tablet Commonly known as: LIPITOR Take 1 tablet (40 mg total) by mouth daily at 6 PM.   doxazosin 1 MG tablet Commonly known as: Cardura Take 1 tablet (1 mg total) by mouth daily.   omeprazole 40 MG capsule Commonly known as: PRILOSEC Take 1 capsule (40 mg total) by mouth daily. TAKE ONE (1) CAPSULE EACH DAY        Objective:   BP 120/79   Pulse 69   Temp 98.6 F (37 C)   Ht 5\' 6"  (1.676 m)   Wt 300 lb (136.1 kg)   SpO2 93%   BMI 48.42 kg/m   Wt Readings from Last 3 Encounters:  05/13/20 300 lb (136.1 kg)  04/05/20 (!) 310 lb (140.6 kg)  01/29/20 (!) 301 lb (136.5 kg)    Physical Exam Vitals and nursing note reviewed.  Constitutional:  General: He is not in acute distress.    Appearance: He is well-developed. He is not diaphoretic.  Abdominal:     General: Abdomen is flat. Bowel sounds are normal. There is no distension.     Tenderness: There is no abdominal tenderness. There is no right CVA tenderness, left CVA tenderness, guarding or rebound.  Neurological:     Mental Status: He is alert and oriented to person, place, and time.     Coordination: Coordination normal.  Psychiatric:        Behavior: Behavior normal.     Urinalysis: 11-30 WBCs, 0-2 RBCs, no epithelial cells, moderate bacteria, 2+ leukocytes and 1+ blood and 2+ protein and trace ketones.  Assessment & Plan:   Problem List Items Addressed This Visit    None    Visit Diagnoses    UTI symptoms    -  Primary   Relevant Medications   cephALEXin  (KEFLEX) 500 MG capsule   Other Relevant Orders   Urine Culture   Urinalysis, Complete   Urinary retention       Relevant Orders   Ambulatory referral to Urology       Follow up plan: Return if symptoms worsen or fail to improve.  Counseling provided for all of the vaccine components Orders Placed This Encounter  Procedures  . Urine Culture  . Urinalysis, Complete    Caryl Pina, MD [facility] 05/13/2020, 2:52 PM

## 2020-05-17 ENCOUNTER — Telehealth: Payer: Self-pay | Admitting: Family Medicine

## 2020-05-17 NOTE — Telephone Encounter (Signed)
Patient's urine culture did not grow anything significant to look like infection, the urine does show a little inflammation but no significant bacteria.

## 2020-05-18 NOTE — Telephone Encounter (Signed)
Pt aware of provider results and recommendations and voiced understanding.

## 2020-05-19 LAB — URINE CULTURE

## 2020-06-07 ENCOUNTER — Ambulatory Visit (INDEPENDENT_AMBULATORY_CARE_PROVIDER_SITE_OTHER): Payer: Self-pay | Admitting: Pulmonary Disease

## 2020-06-07 ENCOUNTER — Other Ambulatory Visit: Payer: Self-pay

## 2020-06-07 ENCOUNTER — Encounter: Payer: Self-pay | Admitting: Pulmonary Disease

## 2020-06-07 VITALS — BP 148/82 | HR 66 | Temp 97.0°F | Ht 65.0 in | Wt 306.2 lb

## 2020-06-07 DIAGNOSIS — R0600 Dyspnea, unspecified: Secondary | ICD-10-CM

## 2020-06-07 DIAGNOSIS — Z720 Tobacco use: Secondary | ICD-10-CM

## 2020-06-07 DIAGNOSIS — R5381 Other malaise: Secondary | ICD-10-CM

## 2020-06-07 DIAGNOSIS — Z598 Other problems related to housing and economic circumstances: Secondary | ICD-10-CM

## 2020-06-07 DIAGNOSIS — R0609 Other forms of dyspnea: Secondary | ICD-10-CM

## 2020-06-07 DIAGNOSIS — G4733 Obstructive sleep apnea (adult) (pediatric): Secondary | ICD-10-CM

## 2020-06-07 DIAGNOSIS — Z6841 Body Mass Index (BMI) 40.0 and over, adult: Secondary | ICD-10-CM

## 2020-06-07 DIAGNOSIS — Z5989 Other problems related to housing and economic circumstances: Secondary | ICD-10-CM

## 2020-06-07 NOTE — Progress Notes (Signed)
[facility]  Chief Complaint  Patient presents with  . Follow-up    shortness of breath with activity    Constitutional:  BP (!) 148/82 (BP Location: Left Arm, Cuff Size: Normal)   Pulse 66   Temp (!) 97 F (36.1 C) (Other (Comment)) Comment (Src): wrist  Ht 5\' 5"  (1.651 m)   Wt (!) 306 lb 3.2 oz (138.9 kg)   SpO2 95% Comment: Room air  BMI 50.95 kg/m   Past Medical History:  SVT, Gallstone pancreatitis, HTN, HLD, Hep C, GERD  Past Surgical History:  His  has a past surgical history that includes JAW BROKEN; Cholecystectomy; LIPOMA REMOVALS; Colonoscopy (10/04/2012); Ablation (06-22-14); supraventricular tachycardia ablation (N/A, 06/22/2014); Esophagogastroduodenoscopy (N/A, 09/04/2014); Gallbladder surgery; and RIGHT/LEFT HEART CATH AND CORONARY ANGIOGRAPHY (N/A, 10/08/2018).  Brief Summary:  Thomas Ball is a 59 y.o. male with OSA and dyspnea.      Subjective:  He continues to smoke cigarettes.  Knows he has to quit.  Is going to use nicotine patch.  He gets intermittent cough with clear sputum and chest congestion.  Takes deep breaths and then can cough up sputum.  Doesn't feel like inhalers have helped before.  Not having wheeze.  Intermittently gets short of breath with activity, but then recovers after resting for a minute or two.  Tries using CPAP nightly.  Still has too pay for supplies on his own.  His mask leaks frequently.  He feels better when he can use CPAP more.  Physical Exam:   Appearance - well kempt   ENMT - no sinus tenderness, no oral exudate, no LAN, Mallampati 4 airway, no stridor, poor dentition  Respiratory - equal breath sounds bilaterally, no wheezing or rales  CV - s1s2 regular rate and rhythm, no murmurs  Ext - no clubbing, no edema  Skin - no rashes  Psych - normal mood and affect   Pulmonary testing:   PFT 12/02/18 >> FEV1 2.78 (92%), FEV1% 79, TLC 4.83 (83%), DLCO 97%  Chest  Imaging:   CT angio chest 04/28/17 >> calcified granuloma LUL  Sleep Tests:   PSG 08/01/17 >> AHI 40.4, SpO2 low 74%  Auto CPAP 05/05/20 to 06/03/20 >> used on 21 of 30 nights with average 1 hrs 21 min.  Average AHI 39.3 with median CPAP 11 and 95 th percentile CPAP 13 cm H2O  Cardiac Tests:   Echo 09/16/18 >> EF 60 to 65%, mild LVH, grade 1 DD  RHC/LHC 10/08/18 >> no CAD, mild pulmonary HTN  Social History:  He  reports that he has been smoking cigarettes. He has a 35.00 pack-year smoking history. He has never used smokeless tobacco. He reports that he does not drink alcohol and does not use drugs.  Family History:  His family history includes COPD in his mother; Diabetes in his father; Early death in his mother; Emphysema in his mother; High Cholesterol in his father; Hyperlipidemia in his father; Stroke in his father.     Assessment/Plan:   Obstructive sleep apnea. - insufficient financial resources are significant restraint for optimal CPAP therapy - he will continue to purchase supplies on his own  - continue auto CPAP  Dyspnea on exertion. - likely related to obesity and deconditioning - discussed options to assist with weight loss regarding dietary modifications - encouraged him to maintain a regular exercise regimen  Tobacco abuse. - he will try using nicotine patch  Lack of health insurance. - advised him  to check if he might be eligible for [place] Medicaid  Time Spent Involved in Patient Care on Day of Examination:  22 minutes  Follow up:  Patient Instructions  Follow up in 1 year   Medication List:   Allergies as of 06/07/2020   No Known Allergies     Medication List       Accurate as of June 07, 2020 10:23 AM. If you have any questions, ask your nurse or doctor.        STOP taking these medications   cephALEXin 500 MG capsule Commonly known as: KEFLEX Stopped by: Chesley Mires, MD     TAKE these medications   albuterol (2.5 MG/3ML) 0.083%  nebulizer solution Commonly known as: PROVENTIL Take 3 mLs (2.5 mg total) by nebulization every 6 (six) hours as needed for wheezing or shortness of breath.   aspirin EC 81 MG tablet Take 1 tablet (81 mg total) by mouth daily.   atorvastatin 40 MG tablet Commonly known as: LIPITOR Take 1 tablet (40 mg total) by mouth daily at 6 PM.   doxazosin 1 MG tablet Commonly known as: Cardura Take 1 tablet (1 mg total) by mouth daily.   omeprazole 40 MG capsule Commonly known as: PRILOSEC Take 1 capsule (40 mg total) by mouth daily. TAKE ONE (1) CAPSULE EACH DAY       Signature:  Chesley Mires, MD [place] Pager - [phone] 06/07/2020, 10:23 AM

## 2020-06-07 NOTE — Patient Instructions (Signed)
Follow up in 1 year.

## 2020-06-18 ENCOUNTER — Ambulatory Visit: Payer: Self-pay | Admitting: Urology

## 2020-07-30 ENCOUNTER — Ambulatory Visit: Payer: Self-pay | Admitting: Urology

## 2020-07-30 DIAGNOSIS — N401 Enlarged prostate with lower urinary tract symptoms: Secondary | ICD-10-CM

## 2020-08-16 ENCOUNTER — Other Ambulatory Visit: Payer: Self-pay

## 2020-08-16 ENCOUNTER — Encounter: Payer: Self-pay | Admitting: Family Medicine

## 2020-08-16 ENCOUNTER — Other Ambulatory Visit: Payer: Self-pay | Admitting: Family Medicine

## 2020-08-16 ENCOUNTER — Ambulatory Visit (INDEPENDENT_AMBULATORY_CARE_PROVIDER_SITE_OTHER): Payer: Self-pay | Admitting: Family Medicine

## 2020-08-16 VITALS — BP 120/72 | HR 64 | Temp 97.1°F | Ht 65.0 in | Wt 311.0 lb

## 2020-08-16 DIAGNOSIS — I10 Essential (primary) hypertension: Secondary | ICD-10-CM

## 2020-08-16 DIAGNOSIS — N401 Enlarged prostate with lower urinary tract symptoms: Secondary | ICD-10-CM

## 2020-08-16 DIAGNOSIS — E65 Localized adiposity: Secondary | ICD-10-CM

## 2020-08-16 DIAGNOSIS — L304 Erythema intertrigo: Secondary | ICD-10-CM

## 2020-08-16 DIAGNOSIS — R351 Nocturia: Secondary | ICD-10-CM

## 2020-08-16 DIAGNOSIS — G4733 Obstructive sleep apnea (adult) (pediatric): Secondary | ICD-10-CM

## 2020-08-16 DIAGNOSIS — Z0001 Encounter for general adult medical examination with abnormal findings: Secondary | ICD-10-CM

## 2020-08-16 DIAGNOSIS — Z0000 Encounter for general adult medical examination without abnormal findings: Secondary | ICD-10-CM

## 2020-08-16 DIAGNOSIS — E7800 Pure hypercholesterolemia, unspecified: Secondary | ICD-10-CM

## 2020-08-16 DIAGNOSIS — E119 Type 2 diabetes mellitus without complications: Secondary | ICD-10-CM

## 2020-08-16 LAB — BAYER DCA HB A1C WAIVED: HB A1C (BAYER DCA - WAIVED): 8.3 % — ABNORMAL HIGH (ref <7.0)

## 2020-08-16 LAB — CMP14+EGFR
Bilirubin Total: 1.1 mg/dL (ref 0.0–1.2)
CO2: 23 mmol/L (ref 20–29)

## 2020-08-16 MED ORDER — NYSTATIN 100000 UNIT/GM EX POWD: 1.0000 "application " | Freq: Three times a day (TID) | CUTANEOUS | 3 refills | Status: DC

## 2020-08-16 MED ORDER — ATORVASTATIN CALCIUM 40 MG PO TABS: 40.0000 mg | ORAL_TABLET | Freq: Every day | ORAL | 1 refills | Status: DC

## 2020-08-16 MED ORDER — OMEPRAZOLE 40 MG PO CPDR: 40.0000 mg | DELAYED_RELEASE_CAPSULE | Freq: Every day | ORAL | 1 refills | Status: DC

## 2020-08-16 MED ORDER — METFORMIN HCL 500 MG PO TABS: ORAL_TABLET | ORAL | 0 refills | Status: DC

## 2020-08-16 MED ORDER — DOXAZOSIN MESYLATE 1 MG PO TABS: 1.0000 mg | ORAL_TABLET | Freq: Every day | ORAL | 1 refills | Status: DC

## 2020-08-16 NOTE — Progress Notes (Signed)
Thomas Ball is a 59 y.o. male presents to office today for annual physical exam examination.    Concerns today include: 1.  Rash under abdomen Patient reports an abdominal rash that has been present for several months now.  He is used a baby powder and lotion but symptoms seem to be getting worse.  He also reports hardness at the inferior aspect of his belly.  He admits to about a 50 pound weight gain since the start of Covid.  He reports increased stress at home secondary to his wife's medical problems.  He has been trying to care for her.  He admits that he does not eat well.  Sometimes he will go the whole day without eating so that he can eat a 1400-calorie supper.  He admits to drinking sodas but is trying to transition over to diet sodas.  He has constipation.  He does not exercise  Marital status: Married, Diet: Poor, Exercise: No structured Last colonoscopy: UTD Refills needed today: none Immunizations needed:  Immunization History  Administered Date(s) Administered   Influenza,inj,Quad PF,6+ Mos 07/02/2015, 09/09/2018   Past Medical History:  Diagnosis Date   GERD (gastroesophageal reflux disease)    HCV (hepatitis C virus) DR. ZACKS   AUG 2013 VIRAL LOAD UNDETECTABLE   History of cardiac catheterization    a. 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   SVT (supraventricular tachycardia) ([place])    Social History   Socioeconomic History   Marital status: Married    Spouse name: Not on file   Number of children: Not on file   Years of education: Not on file   Highest education level: Not on file  Occupational History   Occupation: concrete work    Fish farm manager: SELF-EMPLOYED  Tobacco Use   Smoking status: Current Every Day Smoker    Packs/day: 1.00    Years: 35.00    Pack years: 35.00    Types: Cigarettes   Smokeless  tobacco: Never Used   Tobacco comment: smokes 1-1.5 packs per day  Vaping Use   Vaping Use: Never used  Substance and Sexual Activity   Alcohol use: No   Drug use: No    Comment: in the 1980s, smoke crack. None since Dec 2011.    Sexual activity: Not on file  Other Topics Concern   Not on file  Social History Narrative   Not on file   Social Determinants of Health   Financial Resource Strain:    Difficulty of Paying Living Expenses: Not on file  Food Insecurity:    Worried About [place] in the Last Year: Not on file   Ran Out of Food in the Last Year: Not on file  Transportation Needs:    Lack of Transportation (Medical): Not on file   Lack of Transportation (Non-Medical): Not on file  Physical Activity:    Days of Exercise per Week: Not on file   Minutes of Exercise per Session: Not on file  Stress:    Feeling of Stress : Not on file  Social Connections:    Frequency of Communication with Friends and Family: Not on file   Frequency of Social Gatherings with Friends and Family: Not on file   Attends Religious Services: Not on file   Active Member of Clubs or Organizations: Not on  file   Attends Archivist Meetings: Not on file   Marital Status: Not on file  Intimate Partner Violence:    Fear of Current or Ex-Partner: Not on file   Emotionally Abused: Not on file   Physically Abused: Not on file   Sexually Abused: Not on file   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   CHOLECYSTECTOMY     COLONOSCOPY  10/04/2012   SLF:ONE/8 SIMPLE ADENOMA, TICS, SML IH   ESOPHAGOGASTRODUODENOSCOPY N/A 09/04/2014   Procedure: ESOPHAGOGASTRODUODENOSCOPY (EGD);  Surgeon: Danie Binder, MD;  Location: [facility];  Service: Endoscopy;  Laterality: N/A;  145   GALLBLADDER SURGERY     JAW BROKEN     LIPOMA REMOVALS     SEVERAL   RIGHT/LEFT HEART CATH AND CORONARY  ANGIOGRAPHY N/A 10/08/2018   Procedure: RIGHT/LEFT HEART CATH AND CORONARY ANGIOGRAPHY;  Surgeon: Martinique, Peter M, MD;  Location: [facility];  Service: Cardiovascular;  Laterality: N/A;   SUPRAVENTRICULAR TACHYCARDIA ABLATION N/A 06/22/2014   Procedure: SUPRAVENTRICULAR TACHYCARDIA ABLATION;  Surgeon: Evans Lance, MD;  Location: [facility];  Service: Cardiovascular;  Laterality: N/A;   Family History  Problem Relation Age of Onset   Emphysema Mother    COPD Mother    Early death Mother    Hyperlipidemia Father    Stroke Father    Diabetes Father    High Cholesterol Father    Colon cancer Neg Hx     Current Outpatient Medications:    albuterol (PROVENTIL) (2.5 MG/3ML) 0.083% nebulizer solution, Take 3 mLs (2.5 mg total) by nebulization every 6 (six) hours as needed for wheezing or shortness of breath., Disp: 150 mL, Rfl: 0   atorvastatin (LIPITOR) 40 MG tablet, Take 1 tablet (40 mg total) by mouth daily at 6 PM., Disp: 90 tablet, Rfl: 1   doxazosin (CARDURA) 1 MG tablet, Take 1 tablet (1 mg total) by mouth daily., Disp: 90 tablet, Rfl: 1   omeprazole (PRILOSEC) 40 MG capsule, Take 1 capsule (40 mg total) by mouth daily. TAKE ONE (1) CAPSULE EACH DAY, Disp: 90 capsule, Rfl: 1   aspirin EC 81 MG tablet, Take 1 tablet (81 mg total) by mouth daily. (Patient not taking: Reported on 08/16/2020), Disp: , Rfl:   No Known Allergies   ROS: Review of Systems Pertinent items noted in HPI and remainder of comprehensive ROS otherwise negative.    Physical exam BP 120/72    Pulse 64    Temp (!) 97.1 F (36.2 C)    Ht '5\' 5"'  (1.651 m)    Wt (!) 311 lb (141.1 kg)    SpO2 95%    BMI 51.75 kg/m  General appearance: alert, cooperative, appears stated age and morbidly obese Head: Normocephalic, without obvious abnormality, atraumatic Eyes: negative findings: lids and lashes normal, conjunctivae and sclerae normal and corneas clear Ears: Normal externally Nose: no  discharge Throat: MMM Neck: no adenopathy and supple, symmetrical, trachea midline Back: symmetric, no curvature. ROM normal. No CVA tenderness. Lungs: clear to auscultation bilaterally Chest wall: no tenderness Heart: regular rate and rhythm, S1, S2 normal, no murmur, click, rub or gallop Abdomen: Protuberant, morbidly obese.'s postsurgical scars noted in the right upper quadrant.  He has soft tissue swelling of the inferior aspect of the pannus.  No skin breakdown or associated erythema.  No significant tenderness palpation Extremities: extremities normal, atraumatic, no cyanosis or edema  Pulses: 2+ and symmetric Skin: Soft tissue swelling of pannus as above.  He has a mildly macerated, erythematous rash underneath his pannus Lymph nodes: Cervical, supraclavicular, and axillary nodes normal. Neurologic: Grossly normal Psych: Mood stable, speech normal  Body mass index is 51.75 kg/m.   Assessment/ Plan: Thomas Ball here for annual physical exam.   Annual physical exam  Essential hypertension - Plan: CMP14+EGFR  Pure hypercholesterolemia - Plan: atorvastatin (LIPITOR) 40 MG tablet, CMP14+EGFR, TSH  Benign prostatic hyperplasia with nocturia - Plan: doxazosin (CARDURA) 1 MG tablet  OSA (obstructive sleep apnea)  Morbid obesity ([place]) - Plan: CMP14+EGFR, TSH, Bayer DCA Hb A1c Waived  Intertrigo - Plan: nystatin (MYCOSTATIN/NYSTOP) powder  Abdominal panniculus - Plan: nystatin (MYCOSTATIN/NYSTOP) powder  Blood pressure is controlled.  Continue current regimen.  Continue statin.  Not due for fasting labs. Continue CPAP machine Rash clinically consistent with a candidal intertrigo.  Start nystatin applied to the affected areas.  Discussed moisture wicking.   He had visible panniculus morbid as noted with soft tissue swelling of the lower abdominal cutaneous tissue.  No evidence of secondary bacterial infection or complication at this time. Weight loss will be essential in  preventing ongoing friction and moisture collection  Counseled on healthy lifestyle choices, including diet (rich in fruits, vegetables and lean meats and low in salt and simple carbohydrates) and exercise (at least 30 minutes of moderate physical activity daily).  Patient to follow up in 2 months for recheck  Saabir Blyth M. Lajuana Ripple, DO

## 2020-08-16 NOTE — Patient Instructions (Signed)
Moisture wicking material would be helpful.  Ice to the swollen part of the abdomen.  Use the powder I gave you 3 times daily under the belly.  Intertrigo Intertrigo is skin irritation (inflammation) that happens in warm, moist areas of the body. The irritation can cause a rash and make skin raw and itchy. The rash is usually pink or red. It happens mostly between folds of skin or where skin rubs together, such as:  Between the toes.  In the armpits.  In the groin area.  Under the belly.  Under the breasts.  Around the butt area. This condition is not passed from person to person (is not contagious). What are the causes?  Heat, moisture, rubbing, and not enough air movement.  The condition can be made worse by: ? Sweat. ? Bacteria. ? A fungus, such as yeast. What increases the risk?  Moisture in your skin folds.  You are more likely to develop this condition if you: ? Have diabetes. ? Are overweight. ? Are not able to move around. ? Live in a warm and moist climate. ? Wear splints, braces, or other medical devices. ? Are not able to control your pee (urine) or poop (stool). What are the signs or symptoms?  A pink or red skin rash in the skin fold or near the skin fold.  Raw or scaly skin.  Itching.  A burning feeling.  Bleeding.  Leaking fluid.  A bad smell. How is this treated?  Cleaning and drying your skin.  Taking an antibiotic medicine or using an antibiotic skin cream for a bacterial infection.  Using an antifungal cream on your skin or taking pills for an infection that was caused by a fungus, such as yeast.  Using a steroid ointment to stop the itching and irritation.  Separating the skin fold with a clean cotton cloth to absorb moisture and allow air to flow into the area. Follow these instructions at home:  Keep the affected area clean and dry.  Do not scratch your skin.  Stay cool as much as you can. Use an air conditioner or a fan, if  you have one.  Apply over-the-counter and prescription medicines only as told by your doctor.  If you were prescribed an antibiotic medicine, use it as told by your doctor. Do not stop using the antibiotic even if your condition starts to get better.  Keep all follow-up visits as told by your doctor. This is important. How is this prevented?   Stay at a healthy weight.  Take care of your feet. This is very important if you have diabetes. You should: ? Wear shoes that fit well. ? Keep your feet dry. ? Wear clean cotton or wool socks.  Protect the skin in your groin and butt area as told by your doctor. To do this: ? Follow a regular cleaning routine. ? Use creams, powders, or ointments that protect your skin. ? Change protection pads often.  Do not wear tight clothes. Wear clothes that: ? Are loose. ? Take moisture away from your body. ? Are made of cotton.  Wear a bra that gives good support, if needed.  Shower and dry yourself well after being active. Use a hair dryer on a cool setting to dry between skin folds.  Keep your blood sugar under control if you have diabetes. Contact a doctor if:  Your symptoms do not get better with treatment.  Your symptoms get worse or they spread.  You notice more redness  and warmth.  You have a fever. Summary  Intertrigo is skin irritation that occurs when folds of skin rub together.  This condition is caused by heat, moisture, and rubbing.  This condition may be treated by cleaning and drying your skin and with medicines.  Apply over-the-counter and prescription medicines only as told by your doctor.  Keep all follow-up visits as told by your doctor. This is important. This information is not intended to replace advice given to you by your health care provider. Make sure you discuss any questions you have with your health care provider. Document Revised: 06/20/2018 Document Reviewed: 06/20/2018 Elsevier Patient Education  2020  [employer].

## 2020-08-17 LAB — CMP14+EGFR
ALT: 27 IU/L (ref 0–44)
AST: 20 IU/L (ref 0–40)
Albumin/Globulin Ratio: 2 (ref 1.2–2.2)
Albumin: 4.2 g/dL (ref 3.8–4.9)
Alkaline Phosphatase: 30 IU/L — ABNORMAL LOW (ref 44–121)
BUN/Creatinine Ratio: 14 (ref 9–20)
BUN: 13 mg/dL (ref 6–24)
Calcium: 8.7 mg/dL (ref 8.7–10.2)
Chloride: 98 mmol/L (ref 96–106)
Creatinine, Ser: 0.92 mg/dL (ref 0.76–1.27)
GFR calc Af Amer: 106 mL/min/{1.73_m2} (ref >59)
GFR calc non Af Amer: 92 mL/min/{1.73_m2} (ref >59)
Globulin, Total: 2.1 g/dL (ref 1.5–4.5)
Glucose: 198 mg/dL — ABNORMAL HIGH (ref 65–99)
Potassium: 4.4 mmol/L (ref 3.5–5.2)
Sodium: 135 mmol/L (ref 134–144)
Total Protein: 6.3 g/dL (ref 6.0–8.5)

## 2020-08-17 LAB — TSH: TSH: 0.893 u[IU]/mL (ref 0.450–4.500)

## 2020-08-26 ENCOUNTER — Telehealth: Payer: Self-pay

## 2020-08-26 NOTE — Telephone Encounter (Signed)
Patient called complaining of shortness of breath. It started yesterday. He believes it may be from his new medication, Metformin. That is the only thing that is new. Pt denies chest pain. He is looking for his O2 stat but could not locate it while we were on the phone. Pt does have a O2 tank and he will start that at 2L.  Will send to [place] since Lajuana Ripple is out of the office.

## 2020-08-26 NOTE — Telephone Encounter (Signed)
Patient aware and verbalized understanding. °

## 2020-08-26 NOTE — Telephone Encounter (Signed)
Metformin does not cause shortness of breath, if he is having shortness of breath and it is new, he needs to be seen either here or in the emergency department.  He can stop the Metformin I am fine with that but it does not cause shortness of breath.

## 2020-08-27 ENCOUNTER — Emergency Department (HOSPITAL_COMMUNITY): Payer: Self-pay

## 2020-08-27 ENCOUNTER — Encounter (HOSPITAL_COMMUNITY): Payer: Self-pay | Admitting: Emergency Medicine

## 2020-08-27 ENCOUNTER — Emergency Department (HOSPITAL_COMMUNITY)
Admission: EM | Admit: 2020-08-27 | Discharge: 2020-08-27 | Disposition: A | Discharge status: Home / Self Care | Payer: Self-pay | Attending: Emergency Medicine | Admitting: Emergency Medicine

## 2020-08-27 ENCOUNTER — Other Ambulatory Visit: Payer: Self-pay

## 2020-08-27 DIAGNOSIS — Z955 Presence of coronary angioplasty implant and graft: Secondary | ICD-10-CM | POA: Insufficient documentation

## 2020-08-27 DIAGNOSIS — Z7982 Long term (current) use of aspirin: Secondary | ICD-10-CM | POA: Insufficient documentation

## 2020-08-27 DIAGNOSIS — R0602 Shortness of breath: Secondary | ICD-10-CM | POA: Insufficient documentation

## 2020-08-27 DIAGNOSIS — Z20822 Contact with and (suspected) exposure to covid-19: Secondary | ICD-10-CM | POA: Insufficient documentation

## 2020-08-27 DIAGNOSIS — I10 Essential (primary) hypertension: Secondary | ICD-10-CM | POA: Insufficient documentation

## 2020-08-27 DIAGNOSIS — F17210 Nicotine dependence, cigarettes, uncomplicated: Secondary | ICD-10-CM | POA: Insufficient documentation

## 2020-08-27 DIAGNOSIS — Z85828 Personal history of other malignant neoplasm of skin: Secondary | ICD-10-CM | POA: Insufficient documentation

## 2020-08-27 DIAGNOSIS — R0600 Dyspnea, unspecified: Secondary | ICD-10-CM

## 2020-08-27 LAB — COMPREHENSIVE METABOLIC PANEL
ALT: 22 U/L (ref 0–44)
AST: 16 U/L (ref 15–41)
Albumin: 3.8 g/dL (ref 3.5–5.0)
Alkaline Phosphatase: 19 U/L — ABNORMAL LOW (ref 38–126)
Anion gap: 5 (ref 5–15)
BUN: 15 mg/dL (ref 6–20)
CO2: 28 mmol/L (ref 22–32)
Calcium: 8.6 mg/dL — ABNORMAL LOW (ref 8.9–10.3)
Chloride: 101 mmol/L (ref 98–111)
Creatinine, Ser: 0.73 mg/dL (ref 0.61–1.24)
GFR, Estimated: >60 mL/min (ref >60)
Glucose, Bld: 185 mg/dL — ABNORMAL HIGH (ref 70–99)
Potassium: 3.8 mmol/L (ref 3.5–5.1)
Sodium: 134 mmol/L — ABNORMAL LOW (ref 135–145)
Total Bilirubin: 1.5 mg/dL — ABNORMAL HIGH (ref 0.3–1.2)
Total Protein: 6.8 g/dL (ref 6.5–8.1)

## 2020-08-27 LAB — CBC WITH DIFFERENTIAL/PLATELET
Abs Immature Granulocytes: 0.02 10*3/uL (ref 0.00–0.07)
Basophils Absolute: 0 10*3/uL (ref 0.0–0.1)
Basophils Relative: 1 %
Eosinophils Absolute: 0.1 10*3/uL (ref 0.0–0.5)
Eosinophils Relative: 1 %
HCT: 40.4 % (ref 39.0–52.0)
Hemoglobin: 13.5 g/dL (ref 13.0–17.0)
Immature Granulocytes: 0 %
Lymphocytes Relative: 17 %
Lymphs Abs: 1.1 10*3/uL (ref 0.7–4.0)
MCH: 31.9 pg (ref 26.0–34.0)
MCHC: 33.4 g/dL (ref 30.0–36.0)
MCV: 95.5 fL (ref 80.0–100.0)
Monocytes Absolute: 0.4 10*3/uL (ref 0.1–1.0)
Monocytes Relative: 7 %
Neutro Abs: 4.6 10*3/uL (ref 1.7–7.7)
Neutrophils Relative %: 74 %
Platelets: 133 10*3/uL — ABNORMAL LOW (ref 150–400)
RBC: 4.23 MIL/uL (ref 4.22–5.81)
RDW: 13.4 % (ref 11.5–15.5)
WBC: 6.2 10*3/uL (ref 4.0–10.5)
nRBC: 0 % (ref 0.0–0.2)

## 2020-08-27 LAB — RESP PANEL BY RT-PCR (FLU A&B, COVID) ARPGX2
Influenza A by PCR: NEGATIVE
Influenza B by PCR: NEGATIVE
SARS Coronavirus 2 by RT PCR: NEGATIVE

## 2020-08-27 LAB — TROPONIN I (HIGH SENSITIVITY)
Troponin I (High Sensitivity): 6 ng/L (ref <18)
Troponin I (High Sensitivity): 5 ng/L (ref <18)

## 2020-08-27 MED ORDER — PREDNISONE 50 MG PO TABS
60.0000 mg | ORAL_TABLET | Freq: Once | ORAL | Status: AC
  Administered 2020-08-27: 60 mg via ORAL
  Filled 2020-08-27: qty 1

## 2020-08-27 MED ORDER — IPRATROPIUM-ALBUTEROL 0.5-2.5 (3) MG/3ML IN SOLN
3.0000 mL | Freq: Once | RESPIRATORY_TRACT | Status: AC
  Administered 2020-08-27: 3 mL via RESPIRATORY_TRACT
  Filled 2020-08-27: qty 3

## 2020-08-27 MED ORDER — ALBUTEROL SULFATE (2.5 MG/3ML) 0.083% IN NEBU
2.5000 mg | INHALATION_SOLUTION | Freq: Once | RESPIRATORY_TRACT | Status: AC
  Administered 2020-08-27: 2.5 mg via RESPIRATORY_TRACT
  Filled 2020-08-27: qty 3

## 2020-08-27 MED ORDER — ALBUTEROL SULFATE HFA 108 (90 BASE) MCG/ACT IN AERS: 4.0000 | INHALATION_SPRAY | Freq: Once | RESPIRATORY_TRACT | Status: DC

## 2020-08-27 NOTE — ED Triage Notes (Signed)
Pt c/o SOB that has been going on since yesterday.

## 2020-08-27 NOTE — ED Notes (Signed)
Pt left AMA °

## 2020-08-27 NOTE — ED Provider Notes (Signed)
[facility] Provider Note   CSN: [id number] Arrival date & time: 08/27/20  7322     History Chief Complaint  Patient presents with  . Shortness of Breath    Thomas Ball is a 59 y.o. male.  Patient complains of some shortness of breath.  He has a history of sleep apnea  The history is provided by the patient and medical records. No language interpreter was used.  Shortness of Breath Severity:  Moderate Onset quality:  Sudden Timing:  Constant Progression:  Waxing and waning Chronicity:  New Context: activity   Relieved by:  Nothing Worsened by:  Nothing Ineffective treatments:  None tried Associated symptoms: no abdominal pain, no chest pain, no cough, no headaches and no rash        Past Medical History:  Diagnosis Date  . GERD (gastroesophageal reflux disease)   . HCV (hepatitis C virus) DR. ZACKS   AUG 2013 VIRAL LOAD UNDETECTABLE  . History of cardiac catheterization    a. normal cors by cath in 09/2018 with mild pulmonary HTN  . Hypercholesteremia   . Hypertension   . Pancreatitis, gallstone   . 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 SVT (supraventricular tachycardia) [facility])     Patient Active Problem List   Diagnosis Date Noted  . Pure hypercholesterolemia 01/12/2020  . Dyspnea   . SOB (shortness of breath) 09/15/2018  . Essential hypertension 04/26/2018  . OSA (obstructive sleep apnea) 03/04/2018  . BPH (benign prostatic hyperplasia) 10/07/2015  . EIC (epidermal inclusion cyst) 07/23/2015  . Tobacco abuse 07/02/2015  . SVT (supraventricular tachycardia) ([place])   . Intermittent palpitations 04/21/2014  . Bradycardia 04/21/2014  . Morbid obesity ([place]) 04/21/2014  . Chest pain 04/20/2014  . Cirrhosis of liver ([place]) 11/05/2013  . GERD (gastroesophageal reflux disease) 07/18/2012  . Screening for colorectal cancer 07/18/2012  . Pulmonary nodule 02/14/2012    Past Surgical History:  Procedure Laterality Date  .  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 CHOLECYSTECTOMY    . COLONOSCOPY  10/04/2012   SLF:ONE/8 SIMPLE ADENOMA, TICS, SML IH  . ESOPHAGOGASTRODUODENOSCOPY N/A 09/04/2014   Procedure: ESOPHAGOGASTRODUODENOSCOPY (EGD);  Surgeon: Danie Binder, MD;  Location[facility];  Service: Endoscopy;  Laterality: N/A;  145  . GALLBLADDER SURGERY    . JAW BROKEN    . LIPOMA REMOVALS     SEVERAL  . RIGHT/LEFT HEART CATH AND CORONARY ANGIOGRAPHY N/A 10/08/2018   Procedure: RIGHT/LEFT HEART CATH AND CORONARY ANGIOGRAPHY;  Surgeon: Martinique, Peter M, MD;  Location: [place] CV LAB;  Service: Cardiovascular;  Laterality: N/A;  . SUPRAVENTRICULAR TACHYCARDIA ABLATION N/A 06/22/2014   Procedure: SUPRAVENTRICULAR TACHYCARDIA ABLATION;  Surgeon: Evans Lance, MD;  Location: [facility] CATH LAB;  Service: Cardiovascular;  Laterality: N/A;       Family History  Problem Relation Age of Onset  . Emphysema Mother   . COPD Mother   . Early death Mother   . Hyperlipidemia Father   . Stroke Father   . Diabetes Father   . High Cholesterol Father   . Colon cancer Neg Hx     Social History   Tobacco Use  . Smoking status: Current Every Day Smoker    Packs/day: 1.00    Years: 35.00    Pack years: 35.00    Types: Cigarettes  . Smokeless tobacco: Never Used  . Tobacco comment: smokes 1-1.5 packs per day  Vaping  Use  . Vaping Use: Never used  Substance Use Topics  . Alcohol use: No  . Drug use: No    Comment: in the 1980s, smoke crack. None since Dec 2011.     Home Medications Prior to Admission medications   Medication Sig Start Date End Date Taking? Authorizing Provider  albuterol (PROVENTIL) (2.5 MG/3ML) 0.083% nebulizer solution Take 3 mLs (2.5 mg total) by nebulization every 6 (six) hours as needed for wheezing or shortness of breath. 07/26/18  Yes Eustaquio Maize, MD  atorvastatin (LIPITOR) 40 MG tablet Take 1 tablet (40 mg total) by mouth daily at 6 PM. 08/16/20  Yes Gottschalk, Ashly  M, DO  metFORMIN (GLUCOPHAGE) 500 MG tablet Take 1 tablet (500 mg total) by mouth daily with breakfast for 30 days, THEN 1 tablet (500 mg total) 2 (two) times daily with a meal. 08/16/20 11/14/20 Yes Gottschalk, Ashly M, DO  nystatin (MYCOSTATIN/NYSTOP) powder Apply 1 application topically 3 (three) times daily. x14 days per flare 08/16/20  Yes Gottschalk, Ashly M, DO  omeprazole (PRILOSEC) 40 MG capsule Take 1 capsule (40 mg total) by mouth daily. TAKE ONE (1) CAPSULE EACH DAY Patient taking differently: Take 40 mg by mouth daily.  08/16/20  Yes Ronnie Doss M, DO  aspirin EC 81 MG tablet Take 1 tablet (81 mg total) by mouth daily. Patient not taking: Reported on 08/16/2020 09/18/18   Eugenie Filler, MD  doxazosin (CARDURA) 1 MG tablet Take 1 tablet (1 mg total) by mouth daily. 08/16/20   [name], DO    Allergies    Patient has no known allergies.  Review of Systems   Review of Systems  Constitutional: Negative for appetite change and fatigue.  HENT: Negative for congestion, ear discharge and sinus pressure.   Eyes: Negative for discharge.  Respiratory: Positive for shortness of breath. Negative for cough.   Cardiovascular: Negative for chest pain.  Gastrointestinal: Negative for abdominal pain and diarrhea.  Genitourinary: Negative for frequency and hematuria.  Musculoskeletal: Negative for back pain.  Skin: Negative for rash.  Neurological: Negative for seizures and headaches.  Psychiatric/Behavioral: Negative for hallucinations.    Physical Exam Updated Vital Signs BP (!) 135/95   Pulse 71   Temp 97.9 F (36.6 C) (Oral)   Resp 16   Ht 5\' 5"  (1.651 m)   Wt (!) 141.1 kg   SpO2 95%   BMI 51.75 kg/m   Physical Exam Vitals and nursing note reviewed.  Constitutional:      Appearance: He is well-developed.  HENT:     Head: Normocephalic.     Nose: Nose normal.  Eyes:     General: No scleral icterus.    Conjunctiva/sclera: Conjunctivae normal.  Neck:      Thyroid: No thyromegaly.  Cardiovascular:     Rate and Rhythm: Normal rate and regular rhythm.     Heart sounds: No murmur heard.  No friction rub. No gallop.   Pulmonary:     Breath sounds: No stridor. No wheezing or rales.  Chest:     Chest wall: No tenderness.  Abdominal:     General: There is no distension.     Tenderness: There is no abdominal tenderness. There is no rebound.  Musculoskeletal:        General: Normal range of motion.     Cervical back: Neck supple.  Lymphadenopathy:     Cervical: No cervical adenopathy.  Skin:    Findings: No erythema or rash.  Neurological:  Mental Status: He is alert and oriented to person, place, and time.     Motor: No abnormal muscle tone.     Coordination: Coordination normal.  Psychiatric:        Behavior: Behavior normal.     ED Results / Procedures / Treatments   Labs (all labs ordered are listed, but only abnormal results are displayed) Labs Reviewed  CBC WITH DIFFERENTIAL/PLATELET - Abnormal; Notable for the following components:      Result Value   Platelets 133 (*)    All other components within normal limits  COMPREHENSIVE METABOLIC PANEL - Abnormal; Notable for the following components:   Sodium 134 (*)    Glucose, Bld 185 (*)    Calcium 8.6 (*)    Alkaline Phosphatase 19 (*)    Total Bilirubin 1.5 (*)    All other components within normal limits  RESP PANEL BY RT-PCR (FLU A&B, COVID) ARPGX2  TROPONIN I (HIGH SENSITIVITY)  TROPONIN I (HIGH SENSITIVITY)    EKG None  Radiology DG Chest Port 1 View  Result Date: 08/27/2020 CLINICAL DATA:  Shortness of breath. EXAM: PORTABLE CHEST 1 VIEW COMPARISON:  September 15, 2018. FINDINGS: Stable cardiomegaly. No pneumothorax or pleural effusion is noted. Stable calcified granuloma seen in left lung. No acute pulmonary disease is noted. Bony thorax is unremarkable. IMPRESSION: No acute cardiopulmonary abnormality seen. Electronically Signed   By: Marijo Conception M.D.    On: 08/27/2020 09:22    Procedures Procedures (including critical care time)  Medications Ordered in ED Medications  predniSONE (DELTASONE) tablet 60 mg (60 mg Oral Given 08/27/20 0858)  ipratropium-albuterol (DUONEB) 0.5-2.5 (3) MG/3ML nebulizer solution 3 mL (3 mLs Nebulization Given 08/27/20 1100)  albuterol (PROVENTIL) (2.5 MG/3ML) 0.083% nebulizer solution 2.5 mg (2.5 mg Nebulization Given 08/27/20 1100)    ED Course  I have reviewed the triage vital signs and the nursing notes.  Pertinent labs & imaging results that were available during my care of the patient were reviewed by me and considered in my medical decision making (see chart for details).    MDM Rules/Calculators/A&P                          Patient left the hospital before I was able to discuss his results with him Final Clinical Impression(s) / ED Diagnoses Final diagnoses:  None    Rx / DC Orders ED Discharge Orders    None       Milton Ferguson, MD 08/30/20 1012

## 2020-08-27 NOTE — ED Notes (Addendum)
Pt placed on 1L [place] per request. Pt states, "If I don't have any oxygen and I fall asleep, I'll stop breathing."  Pt is 97% on room air and 99% on 1L. Will continue to monitor.

## 2020-09-06 ENCOUNTER — Telehealth: Payer: Self-pay | Admitting: Family Medicine

## 2020-09-06 NOTE — Telephone Encounter (Signed)
Patient aware and verbalizes understanding. 

## 2020-09-06 NOTE — Telephone Encounter (Signed)
I do not see any emergency department visit from the 6 but I do see a visit from the third.  I reviewed those labs and he had a relative low sodium but corrected for his high sugar this was normal.  His sugar was only and I would say mild to moderately elevated in the 180s.  The remainder of his labs look relatively within normal range.  I reviewed the EKG which showed no acute evidence of ischemia and chest x-ray was also unremarkable.

## 2020-09-30 ENCOUNTER — Encounter: Payer: Self-pay | Admitting: Nurse Practitioner

## 2020-09-30 ENCOUNTER — Ambulatory Visit (INDEPENDENT_AMBULATORY_CARE_PROVIDER_SITE_OTHER): Payer: Self-pay | Admitting: Nurse Practitioner

## 2020-09-30 DIAGNOSIS — R059 Cough, unspecified: Secondary | ICD-10-CM

## 2020-09-30 MED ORDER — PREDNISONE 20 MG PO TABS: ORAL_TABLET | ORAL | 0 refills | Status: DC

## 2020-09-30 MED ORDER — PROMETHAZINE-DM 6.25-15 MG/5ML PO SYRP: 5.0000 mL | ORAL_SOLUTION | Freq: Four times a day (QID) | ORAL | 0 refills | Status: DC | PRN

## 2020-09-30 NOTE — Progress Notes (Signed)
Virtual Visit via telephone Note Due to COVID-19 pandemic this visit was conducted virtually. This visit type was conducted due to national recommendations for restrictions regarding the COVID-19 Pandemic (e.g. social distancing, sheltering in place) in an effort to limit this patient's exposure and mitigate transmission in our community. All issues noted in this document were discussed and addressed.  A physical exam was not performed with this format.  I connected with Thomas Ball on 09/30/20 at 1:00 by telephone and verified that I am speaking with the correct person using two identifiers. Thomas Ball is currently located at home and his wife is currently with him during visit. The provider, Mary-Margaret Daphine Deutscher, FNP is located in their office at time of visit.  I discussed the limitations, risks, security and privacy concerns of performing an evaluation and management service by telephone and the availability of in person appointments. I also discussed with the patient that there may be a patient responsible charge related to this service. The patient expressed understanding and agreed to proceed.   History and Present Illness:   Chief Complaint: Cough (/)   HPI Patient calls in c/o cough with chest tightness. Cough is productive and is a yellowish color. Started a couple of days ago. He denies covid exposure. He has had both vaccines. Wife has caner so they do  Not go out of house.   Review of Systems  Constitutional: Negative for chills and fever.  HENT: Positive for congestion. Negative for ear pain, sinus pain and sore throat.   Respiratory: Positive for cough, sputum production and shortness of breath.   Musculoskeletal: Negative for myalgias.  Neurological: Negative for dizziness and headaches.  Psychiatric/Behavioral: Negative.   All other systems reviewed and are negative.    Observations/Objective: Alert and oriented- answers all questions appropriately No  distress Voice hoarse Dry cough  Assessment and Plan: LONALD TROIANI in today with chief complaint of Cough (/)   1. Cough 1. Take meds as prescribed 2. Use a cool mist humidifier especially during the winter months and when heat has been humid. 3. Use saline nose sprays frequently 4. Saline irrigations of the nose can be very helpful if done frequently.  * 4X daily for 1 week*  * Use of a nettie pot can be helpful with this. Follow directions with this* 5. Drink plenty of fluids 6. Keep thermostat turn down low 7.For any cough or congestion  Use plain Mucinex- regular strength or max strength is fine   * Children- consult with Pharmacist for dosing 8. For fever or aces or pains- take tylenol or ibuprofen appropriate for age and weight.  * for fevers greater than 101 orally you may alternate ibuprofen and tylenol every  3 hours.   Meds ordered this encounter  Medications  . predniSONE (DELTASONE) 20 MG tablet    Sig: 2 po at sametime daily for 5 days    Dispense:  10 tablet    Refill:  0    Order Specific Question:   Supervising Provider    Answer:   Arville Care A [id number]  . promethazine-dextromethorphan (PROMETHAZINE-DM) 6.25-15 MG/5ML syrup    Sig: Take 5 mLs by mouth 4 (four) times daily as needed for cough.    Dispense:  180 mL    Refill:  0    Order Specific Question:   Supervising Provider    Answer:   Arville Care A [[id number]]       Follow Up Instructions: prn  I discussed the assessment and treatment plan with the patient. The patient was provided an opportunity to ask questions and all were answered. The patient agreed with the plan and demonstrated an understanding of the instructions.   The patient was advised to call back or seek an in-person evaluation if the symptoms worsen or if the condition fails to improve as anticipated.  The above assessment and management plan was discussed with the patient. The patient verbalized understanding of  and has agreed to the management plan. Patient is aware to call the clinic if symptoms persist or worsen. Patient is aware when to return to the clinic for a follow-up visit. Patient educated on when it is appropriate to go to the emergency department.   Time call ended:  1:13  I provided 13 minutes of non-face-to-face time during this encounter.    Mary-Margaret Hassell Done, FNP

## 2020-10-06 ENCOUNTER — Telehealth: Payer: Self-pay

## 2020-10-06 NOTE — Telephone Encounter (Signed)
Appointment with MMM - please review and advise

## 2020-10-06 NOTE — Telephone Encounter (Signed)
Patient aware and verbalizes understanding. 

## 2020-10-06 NOTE — Telephone Encounter (Signed)
He was given cough syrup and steroids. Is he running a fever or have any new symptoms. If not it will just take away for the cough to resolve. It can take 10-14 days to resolve.

## 2020-10-18 ENCOUNTER — Ambulatory Visit (INDEPENDENT_AMBULATORY_CARE_PROVIDER_SITE_OTHER): Payer: Self-pay | Admitting: Family Medicine

## 2020-10-18 VITALS — BP 133/75 | HR 61 | Temp 97.6°F | Ht 65.0 in | Wt 313.0 lb

## 2020-10-18 DIAGNOSIS — E65 Localized adiposity: Secondary | ICD-10-CM

## 2020-10-18 DIAGNOSIS — F4320 Adjustment disorder, unspecified: Secondary | ICD-10-CM

## 2020-10-18 DIAGNOSIS — L304 Erythema intertrigo: Secondary | ICD-10-CM

## 2020-10-18 NOTE — Progress Notes (Signed)
Subjective: CC: abdominal panniculus/ intertrigo PCP: [name], DO Thomas Ball is a 60 y.o. male presenting to clinic today for:  1.  Panniculus Patient reports that he still has hard spots on the bottom of his belly but that the redness and irritation seems to have resolved with the use of nystatin.  He uses this intermittently.  He admits that he had lost about 10 pounds with diet modification but he has since gained it back.  His wife was diagnosed with recurrent cancer and it apparently is very aggressive.  He is not seeking counseling and often just tries to forget about this issue.  She was given about a month or so to live and is currently being treated with a new treatment by her oncologist to try and prolong her life.  He never did see the dietitian but he admits that he has been eating unrestrictedly, meals often consisting of to go or restaurant meals   ROS: Per HPI  No Known Allergies Past Medical History:  Diagnosis Date  . GERD (gastroesophageal reflux disease)   . HCV (hepatitis C virus) DR. ZACKS   AUG 2013 VIRAL LOAD UNDETECTABLE  . History of cardiac catheterization    a. normal cors by cath in 09/2018 with mild pulmonary HTN  . Hypercholesteremia   . Hypertension   . Pancreatitis, gallstone   . 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 SVT (supraventricular tachycardia) ([facility])     Current Outpatient Medications:  .  albuterol (PROVENTIL) (2.5 MG/3ML) 0.083% nebulizer solution, Take 3 mLs (2.5 mg total) by nebulization every 6 (six) hours as needed for wheezing or shortness of breath., Disp: 150 mL, Rfl: 0 .  aspirin EC 81 MG tablet, Take 1 tablet (81 mg total) by mouth daily. (Patient not taking: Reported on 08/16/2020), Disp: , Rfl:  .  atorvastatin (LIPITOR) 40 MG tablet, Take 1 tablet (40 mg total) by mouth daily at 6 PM., Disp: 90 tablet, Rfl: 1 .  doxazosin (CARDURA) 1 MG tablet, Take 1 tablet (1 mg total) by mouth daily., Disp: 90  tablet, Rfl: 1 .  metFORMIN (GLUCOPHAGE) 500 MG tablet, Take 1 tablet (500 mg total) by mouth daily with breakfast for 30 days, THEN 1 tablet (500 mg total) 2 (two) times daily with a meal., Disp: 150 tablet, Rfl: 0 .  nystatin (MYCOSTATIN/NYSTOP) powder, Apply 1 application topically 3 (three) times daily. x14 days per flare, Disp: 45 g, Rfl: 3 .  omeprazole (PRILOSEC) 40 MG capsule, Take 1 capsule (40 mg total) by mouth daily. TAKE ONE (1) CAPSULE EACH DAY (Patient taking differently: Take 40 mg by mouth daily. ), Disp: 90 capsule, Rfl: 1 .  predniSONE (DELTASONE) 20 MG tablet, 2 po at sametime daily for 5 days, Disp: 10 tablet, Rfl: 0 .  promethazine-dextromethorphan (PROMETHAZINE-DM) 6.25-15 MG/5ML syrup, Take 5 mLs by mouth 4 (four) times daily as needed for cough., Disp: 180 mL, Rfl: 0 Social History   Socioeconomic History  . Marital status: Married    Spouse name: Not on file  . Number of children: Not on file  . Years of education: Not on file  . Highest education level: Not on file  Occupational History  . Occupation: concrete work    Fish farm manager: SELF-EMPLOYED  Tobacco Use  . Smoking status: Current Every Day Smoker    Packs/day: 1.00    Years: 35.00    Pack years: 35.00    Types: Cigarettes  .  Smokeless tobacco: Never Used  . Tobacco comment: smokes 1-1.5 packs per day  Vaping Use  . Vaping Use: Never used  Substance and Sexual Activity  . Alcohol use: No  . Drug use: No    Comment: in the 1980s, smoke crack. None since Dec 2011.   Marland Kitchen Sexual activity: Not on file  Other Topics Concern  . Not on file  Social History Narrative  . Not on file   Social Determinants of Health   Financial Resource Strain: Not on file  Food Insecurity: Not on file  Transportation Needs: Not on file  Physical Activity: Not on file  Stress: Not on file  Social Connections: Not on file  Intimate Partner Violence: Not on file   Family History  Problem Relation Age of Onset  . Emphysema  Mother   . COPD Mother   . Early death Mother   . Hyperlipidemia Father   . Stroke Father   . Diabetes Father   . High Cholesterol Father   . Colon cancer Neg Hx     Objective: Office vital signs reviewed. BP 133/75   Pulse 61   Temp 97.6 F (36.4 C) (Temporal)   Ht 5\' 5"  (1.651 m)   Wt (!) 313 lb (142 kg)   SpO2 95%   BMI 52.09 kg/m   Physical Examination:  General: Awake, alert, morbidly obese, No acute distress Cardio: regular rate and rhythm, S1S2 heard, no murmurs appreciated Pulm: clear to auscultation bilaterally, no wheezes, rhonchi or rales; normal work of breathing on room air Skin: He still has quite a bit of thickening of the inferior aspect of the pannus.  There is no appreciable secondary fungal infection at this time or skin breakdown.  There is associated hyperpigmentation however. MSK wide-based gait and station Psych: Patient is intermittently tearful when discussing his wife.  Assessment/ Plan: 60 y.o. male   Intertrigo  Abdominal panniculus  Morbid obesity ([place])  Anticipatory grief  Intertrigo has totally resolved.  He continues to have quite a bit of skin thickening and hyperpigmentation along the pannus.  We discussed weight loss is essential to improve this.  I discussed gentle massage of this area also to mobilize fluid I do think that he would benefit from weight loss counseling plus or minus dietitian.  However, he seems to not be in a good position to focus on himself at this point. I did offer counseling services but it sounds like he does not really want to see anybody at this time.  He is certainly using food as an emotional crutch at this time.  I am going to try and reconvene with him in the next 2 to 3 weeks to check in since I anticipate his wife will be going through a lot given her recent diagnosis  No orders of the defined types were placed in this encounter.  No orders of the defined types were placed in this encounter.  [name], DO [place] [phone]

## 2020-12-06 ENCOUNTER — Other Ambulatory Visit: Payer: Self-pay | Admitting: Family Medicine

## 2020-12-06 DIAGNOSIS — E119 Type 2 diabetes mellitus without complications: Secondary | ICD-10-CM

## 2020-12-07 ENCOUNTER — Other Ambulatory Visit: Payer: Self-pay

## 2020-12-07 ENCOUNTER — Ambulatory Visit (INDEPENDENT_AMBULATORY_CARE_PROVIDER_SITE_OTHER): Payer: Self-pay | Admitting: Nurse Practitioner

## 2020-12-07 ENCOUNTER — Encounter: Payer: Self-pay | Admitting: Nurse Practitioner

## 2020-12-07 VITALS — BP 137/81 | HR 61 | Temp 98.4°F | Ht 65.0 in | Wt 299.4 lb

## 2020-12-07 DIAGNOSIS — F410 Panic disorder [episodic paroxysmal anxiety] without agoraphobia: Secondary | ICD-10-CM

## 2020-12-07 DIAGNOSIS — R519 Headache, unspecified: Secondary | ICD-10-CM | POA: Insufficient documentation

## 2020-12-07 DIAGNOSIS — R0989 Other specified symptoms and signs involving the circulatory and respiratory systems: Secondary | ICD-10-CM | POA: Insufficient documentation

## 2020-12-07 MED ORDER — FLUTICASONE PROPIONATE 50 MCG/ACT NA SUSP: 2.0000 | Freq: Every day | NASAL | 6 refills | Status: DC

## 2020-12-07 MED ORDER — HYDROXYZINE HCL 10 MG PO TABS: 10.0000 mg | ORAL_TABLET | Freq: Three times a day (TID) | ORAL | 0 refills | Status: DC | PRN

## 2020-12-07 MED ORDER — EXCEDRIN MIGRAINE 250-250-65 MG PO TABS: 1.0000 | ORAL_TABLET | Freq: Four times a day (QID) | ORAL | 0 refills | Status: DC | PRN

## 2020-12-07 NOTE — Assessment & Plan Note (Signed)
Patient is reporting new panic attacks.  Onset of symptoms in the last few weeks due to end of life illness of spouse.  Education provided to patient, printed handouts given.  Hydroxyzine 10 mg 1 tablet 3 times daily as needed.  Rx sent to pharmacy.  Follow-up with worsening symptoms

## 2020-12-07 NOTE — Progress Notes (Signed)
Acute Office Visit  Subjective:    Patient ID: Thomas Ball, male    DOB: 04/12/1961, 60 y.o.   MRN: [id number]  Chief Complaint  Patient presents with  . Panic Attack  . Headache  . Nasal Congestion    Headache  This is a recurrent problem. The current episode started more than 1 year ago. The problem occurs intermittently. The problem has been unchanged. The pain is located in the frontal region. The pain does not radiate. The quality of the pain is described as aching and throbbing. The pain is moderate. Associated symptoms include sinus pressure. Pertinent negatives include no abdominal pain, muscle aches, nausea or photophobia. He has tried acetaminophen for the symptoms. The treatment provided no relief. His past medical history is significant for obesity.  Anxiety Presents for initial visit. Onset was in the past 7 days. The problem has been gradually worsening. Symptoms include feeling of choking, nervous/anxious behavior and panic. Patient reports no depressed mood or nausea. Symptoms occur occasionally. The severity of symptoms is moderate. The symptoms are aggravated by family issues.       Past Medical History:  Diagnosis Date  . GERD (gastroesophageal reflux disease)   . HCV (hepatitis C virus) DR. ZACKS   AUG 2013 VIRAL LOAD UNDETECTABLE  . History of cardiac catheterization    a. normal cors by cath in 09/2018 with mild pulmonary HTN  . Hypercholesteremia   . Hypertension   . Pancreatitis, gallstone   . 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 SVT (supraventricular tachycardia) (HCC)     Past Surgical History:  Procedure Laterality Date  . 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 CHOLECYSTECTOMY    . COLONOSCOPY  10/04/2012   SLF:ONE/8 SIMPLE ADENOMA, TICS, SML IH  . ESOPHAGOGASTRODUODENOSCOPY N/A 09/04/2014   Procedure: ESOPHAGOGASTRODUODENOSCOPY (EGD);  Surgeon: Danie Binder, MD;  Location: [facility];  Service: Endoscopy;   Laterality: N/A;  145  . GALLBLADDER SURGERY    . JAW BROKEN    . LIPOMA REMOVALS     SEVERAL  . RIGHT/LEFT HEART CATH AND CORONARY ANGIOGRAPHY N/A 10/08/2018   Procedure: RIGHT/LEFT HEART CATH AND CORONARY ANGIOGRAPHY;  Surgeon: Martinique, Peter M, MD;  Location: [facility];  Service: Cardiovascular;  Laterality: N/A;  . SUPRAVENTRICULAR TACHYCARDIA ABLATION N/A 06/22/2014   Procedure: SUPRAVENTRICULAR TACHYCARDIA ABLATION;  Surgeon: Evans Lance, MD;  Location: [facility];  Service: Cardiovascular;  Laterality: N/A;    Family History  Problem Relation Age of Onset  . Emphysema Mother   . COPD Mother   . Early death Mother   . Hyperlipidemia Father   . Stroke Father   . Diabetes Father   . High Cholesterol Father   . Colon cancer Neg Hx     Social History   Socioeconomic History  . Marital status: Married    Spouse name: Not on file  . Number of children: Not on file  . Years of education: Not on file  . Highest education level: Not on file  Occupational History  . Occupation: concrete work    Fish farm manager: SELF-EMPLOYED  Tobacco Use  . Smoking status: Current Every Day Smoker    Packs/day: 1.00    Years: 35.00    Pack years: 35.00    Types: Cigarettes  . Smokeless tobacco: Never Used  . Tobacco comment: smokes 1-1.5 packs per day  Vaping Use  . Vaping Use: Never  used  Substance and Sexual Activity  . Alcohol use: No  . Drug use: No    Comment: in the 1980s, smoke crack. None since Dec 2011.   Marland Kitchen Sexual activity: Not on file  Other Topics Concern  . Not on file  Social History Narrative  . Not on file   Social Determinants of Health   Financial Resource Strain: Not on file  Food Insecurity: Not on file  Transportation Needs: Not on file  Physical Activity: Not on file  Stress: Not on file  Social Connections: Not on file  Intimate Partner Violence: Not on file    Outpatient Medications Prior to Visit  Medication Sig Dispense Refill  . albuterol  (PROVENTIL) (2.5 MG/3ML) 0.083% nebulizer solution Take 3 mLs (2.5 mg total) by nebulization every 6 (six) hours as needed for wheezing or shortness of breath. 150 mL 0  . aspirin EC 81 MG tablet Take 1 tablet (81 mg total) by mouth daily.    Marland Kitchen atorvastatin (LIPITOR) 40 MG tablet Take 1 tablet (40 mg total) by mouth daily at 6 PM. 90 tablet 1  . doxazosin (CARDURA) 1 MG tablet Take 1 tablet (1 mg total) by mouth daily. 90 tablet 1  . metFORMIN (GLUCOPHAGE) 500 MG tablet TAKE 1 TABLET WITH BREAKFAST FOR 30 DAYSTHEN 1 TABLET TWICE DAILY WITH MEALS 150 tablet 0  . nystatin (MYCOSTATIN/NYSTOP) powder Apply 1 application topically 3 (three) times daily. x14 days per flare 45 g 3  . omeprazole (PRILOSEC) 40 MG capsule Take 1 capsule (40 mg total) by mouth daily. TAKE ONE (1) CAPSULE EACH DAY (Patient taking differently: Take 40 mg by mouth daily.) 90 capsule 1  . predniSONE (DELTASONE) 20 MG tablet 2 po at sametime daily for 5 days (Patient not taking: Reported on 12/07/2020) 10 tablet 0  . promethazine-dextromethorphan (PROMETHAZINE-DM) 6.25-15 MG/5ML syrup Take 5 mLs by mouth 4 (four) times daily as needed for cough. (Patient not taking: Reported on 12/07/2020) 180 mL 0   No facility-administered medications prior to visit.    No Known Allergies  Review of Systems  Constitutional: Negative.   HENT: Positive for sinus pressure.   Eyes: Negative for photophobia.  Gastrointestinal: Negative for abdominal pain and nausea.  Neurological: Positive for headaches.  Psychiatric/Behavioral: The patient is nervous/anxious.   All other systems reviewed and are negative.      Objective:    Physical Exam Vitals reviewed.  Constitutional:      Appearance: He is well-developed. He is obese.  HENT:     Head: Normocephalic.     Comments: Headache    Nose: Congestion present.  Eyes:     Conjunctiva/sclera: Conjunctivae normal.  Cardiovascular:     Rate and Rhythm: Normal rate and regular rhythm.      Pulses: Normal pulses.     Heart sounds: Normal heart sounds.  Pulmonary:     Effort: Pulmonary effort is normal.     Breath sounds: Normal breath sounds.  Abdominal:     General: Bowel sounds are normal.  Skin:    General: Skin is dry.  Neurological:     Mental Status: He is alert and oriented to person, place, and time.  Psychiatric:     Comments: Panic attack     BP 137/81   Pulse 61   Temp 98.4 F (36.9 C) (Temporal)   Ht 5\' 5"  (1.651 m)   Wt 299 lb 6.4 oz (135.8 kg)   SpO2 95%   BMI 49.82 kg/m  Wt Readings from Last 3 Encounters:  12/07/20 299 lb 6.4 oz (135.8 kg)  10/18/20 (!) 313 lb (142 kg)  08/27/20 (!) 311 lb (141.1 kg)    Health Maintenance Due  Topic Date Due  . COVID-19 Vaccine (1) Never done  . HIV Screening  Never done    There are no preventive care reminders to display for this patient.   Lab Results  Component Value Date   TSH 0.893 08/16/2020   Lab Results  Component Value Date   WBC 6.2 08/27/2020   HGB 13.5 08/27/2020   HCT 40.4 08/27/2020   MCV 95.5 08/27/2020   PLT 133 (L) 08/27/2020   Lab Results  Component Value Date   NA 134 (L) 08/27/2020   K 3.8 08/27/2020   CO2 28 08/27/2020   GLUCOSE 185 (H) 08/27/2020   BUN 15 08/27/2020   CREATININE 0.73 08/27/2020   BILITOT 1.5 (H) 08/27/2020   ALKPHOS 19 (L) 08/27/2020   AST 16 08/27/2020   ALT 22 08/27/2020   PROT 6.8 08/27/2020   ALBUMIN 3.8 08/27/2020   CALCIUM 8.6 (L) 08/27/2020   ANIONGAP 5 08/27/2020   Lab Results  Component Value Date   CHOL 94 (L) 01/22/2020   Lab Results  Component Value Date   HDL 33 (L) 01/22/2020   Lab Results  Component Value Date   LDLCALC 45 01/22/2020   Lab Results  Component Value Date   TRIG 73 01/22/2020   Lab Results  Component Value Date   CHOLHDL 2.8 01/22/2020   Lab Results  Component Value Date   HGBA1C 8.3 (H) 08/16/2020       Assessment & Plan:   Problem List Items Addressed This Visit      Respiratory    Chest congestion    Cough and congestion symptoms not well controlled.  At home Covid test negative.  Guaifenesin for cough and congestion ordered.  Medication sent to pharmacy.  Patient knows to follow-up with worsening unresolved symptoms.      Relevant Medications   fluticasone (FLONASE) 50 MCG/ACT nasal spray     Other   Headache in front of head - Primary    Ongoing headache.  Patient reports headache has been ongoing in the last 6 to 8 months but he has not had time to address it due to much time caring for his wife.  Patient reports headache is frontal and rates it a moderate pain.  Headache is intermittent and has not responded well to Tylenol. Started patient on Excedrin, patient knows to follow-up with unresolved symptoms.  Rx sent to pharmacy.      Relevant Medications   aspirin-acetaminophen-caffeine (EXCEDRIN MIGRAINE) 250-250-65 MG tablet   Panic attacks    Patient is reporting new panic attacks.  Onset of symptoms in the last few weeks due to end of life illness of spouse.  Education provided to patient, printed handouts given.  Hydroxyzine 10 mg 1 tablet 3 times daily as needed.  Rx sent to pharmacy.  Follow-up with worsening symptoms         Relevant Medications   hydrOXYzine (ATARAX/VISTARIL) 10 MG tablet       Meds ordered this encounter  Medications  . fluticasone (FLONASE) 50 MCG/ACT nasal spray    Sig: Place 2 sprays into both nostrils daily.    Dispense:  16 g    Refill:  6    Order Specific Question:   Supervising Provider    Answer:   [name] [  [id number]]  . hydrOXYzine (ATARAX/VISTARIL) 10 MG tablet    Sig: Take 1 tablet (10 mg total) by mouth 3 (three) times daily as needed.    Dispense:  30 tablet    Refill:  0    Order Specific Question:   Supervising Provider    Answer:   [name] [[id number]]  . aspirin-acetaminophen-caffeine (EXCEDRIN MIGRAINE) 250-250-65 MG tablet    Sig: Take 1 tablet by mouth every 6 (six) hours as  needed for headache.    Dispense:  30 tablet    Refill:  0    Order Specific Question:   Supervising Provider    Answer:   [name] [[id number]]     Ivy Lynn, NP

## 2020-12-07 NOTE — Patient Instructions (Signed)
[email or web address]">  Panic Attack A panic attack is when you suddenly feel very afraid, uncomfortable, or nervous (anxious). A panic attack can happen when you are scared or for no reason. A panic attack can feel like a serious problem. It can even feel like a heart attack or stroke. See your doctor when you have a panic attack to make sure you do not have a serious problem. Follow these instructions at home:  Take medicines only as told by your doctor.  If you feel worried or nervous, try not to have caffeine.  Take good care of your health. To do this: ? Eat healthy. Make sure to eat fresh fruits and vegetables, whole grains, lean meats, and low-fat dairy. ? Get enough sleep. Try to sleep for 7-8 hours each night. ? Exercise. Try to be active for 30 minutes 5 or more days a week. ? Do not smoke. Talk to your doctor if you need help quitting. ? Limit how much alcohol you drink:  If you are a woman who is not pregnant: try not to have more than 1 drink a day.  If you are a man: try not to have more than 2 drinks a day.  One drink equals 12 oz of beer, 5 oz of wine, or 1 oz of hard liquor.  Keep all follow-up visits as told by your doctor. This is important.   Contact a doctor if:  Your symptoms do not get better.  Your symptoms get worse.  You are not able to take your medicines as told. Get help right away if:  You have thoughts of hurting yourself or others.  You have symptoms of a panic attack. Do not drive yourself to the hospital. Have someone else drive you or call an ambulance. If you feel like you may hurt yourself or others, or have thoughts about taking your own life, get help right away. You can go to your nearest emergency department or call:  Your local emergency services (911 in the [place]  A suicide crisis helpline, such as the [facility] at 318 029 4116. This is open 24 hours a  day. Summary  A panic attack is when you suddenly feel very afraid, uncomfortable, or nervous (anxious).  See your doctor when you have a panic attack to make sure that you do not have another serious problem.  If you feel like you may hurt yourself or others, get help right away by calling 911. This information is not intended to replace advice given to you by your health care provider. Make sure you discuss any questions you have with your health care provider. Document Revised: 03/11/2020 Document Reviewed: 03/11/2020 Elsevier Patient Education  [place]. Cluster Headache A cluster headache is a type of primary headache that causes deep, intense head pain. Cluster headaches can last from 15 minutes to 3 hours. They usually occur on one side of the head or face. They may occur on the other side of the head when a new cluster of headaches begins. Cluster headaches occur repeatedly over weeks to months. They may happen several times a day. They often occur at the same time of day, often at night. They may happen more often in the fall and springtime. What are the causes? The cause of this condition is not known. Unlike migraine and tension headache, a cluster headache generally is not associated with triggers, such as foods, hormonal changes, or stress. What increases the risk? The following factors may make you likely  to develop this condition:  Being a male between the ages of 13-58 years old.  Smoking or using products that contain nicotine or tobacco.  Having elevated levels of histamine. This can happen in people who have allergies.  Taking medicines that cause blood vessels to expand, such as nitroglycerin.  Having a parent or sibling who has cluster headaches. What are the signs or symptoms? Symptoms of this condition include:  Extremely bad pain on one side of the head that begins behind or around your eye or temple but may radiate to other areas of your face, head,  and neck.  Nausea.  Sensitivity to light.  Runny nose and nasal stuffiness.  Forehead or facial sweating on the affected side.  Droopy or swollen eyelid, eye redness, or tearing on the affected side.  Restlessness and agitation.  Pale skin (pallor) or flushing on your face. How is this diagnosed? This condition may be diagnosed based on:  Your description of the attacks, including your pain, the location and severity of your headaches, and symptoms. Your health care provider may also ask how often your headaches occur and how long they last.  A neurological examination to detect physical signs of a neurological disorder. Your health care provider will test your senses, reflexes, and nerves. The exam is usually normal in people who have cluster headaches.  Tests. Your health care provider may order additional tests to see if your headaches are caused by another medical condition. These tests may show that you do not have cluster headaches. Tests may include: ? MRI. ? CT scan. ? Blood tests. How is this treated? This condition may be treated with:  Medicines to relieve pain and to prevent repeated attacks. Some people may need a combination of medicines.  Oxygen that is breathed in through a mask. This helps to relieve pain of an attack in 15-20 minutes. Follow these instructions at home: Headache diary Keep a headache diary as told by your health care provider. Doing this can help you and your health care provider figure out what triggers your headaches. In your headache diary, include information about:  The time of day that your headache started and what you were doing when it began.  How long your headache lasted.  Where your pain started and whether it moved to other areas.  The type of pain, such as burning, stabbing, throbbing, or cramping.  Your level of pain. Use a pain scale and rate the pain with a number from 1 (mild) up to 10 (severe).  The treatment that you  used, and any change in symptoms after treatment.   Medicines  Take over-the-counter and prescription medicines only as told by your health care provider.  Ask your health care provider if the medicine prescribed to you: ? Requires you to avoid driving or using heavy machinery. ? Can cause constipation. You may need to take these actions to prevent or treat constipation:  Drink enough fluid to keep your urine pale yellow.  Take over-the-counter or prescription medicines.  Eat foods that are high in fiber, such as beans, whole grains, and fresh fruits and vegetables.  Limit foods that are high in fat and processed sugars, such as fried or sweet foods. Lifestyle  Follow a regular sleep schedule. Do not vary the time that you go to bed or the amount that you sleep from day to day. It is important to stay on the same schedule during a cluster period to help prevent headaches. Get 7-9 hours  of sleep each night, or the amount recommended by your health care provider.  Limit or manage stress. Consider stress relief options such as acupuncture, counseling, biofeedback, and massage. Talk with your health care provider about which methods might be good for you.  Exercise regularly. Exercise for at least 30 minutes, 5 times each week. Moderate exercise may be best.  Eat a healthy diet and avoid any specific foods that may trigger your headaches.  Do not drink alcohol. Drinking alcohol may quickly trigger a severe headache.  Do not use any products that contain nicotine or tobacco, such as cigarettes, e-cigarettes, and chewing tobacco. If you need help quitting, ask your health care provider.   General instructions  Use oxygen as told by your health care provider.  Keep all follow-up visits as told by your health care provider. This is important. Contact a health care provider if:  Your headaches change, become more severe, or occur more often.  The medicine or oxygen that your health care  provider recommended does not help. Get help right away if you:  Faint.  Have weakness or numbness, especially on one side of your body or face.  Have double vision.  Have nausea or vomiting that does not go away within several hours.  Have trouble talking, walking, or keeping your balance.  Have pain or stiffness in your neck and you have a fever. Summary  A cluster headache is a type of primary headache that causes deep, intense head pain, usually on one side of the head.  Keep a headache diary to help discover what triggers your headaches.  Avoiding alcohol and certain medications may help prevent cluster headaches.  There are many treatments for cluster headaches including oxygen, medications to stop a headache, and medications to prevent the headaches. Talk to your doctor about treatment options. This information is not intended to replace advice given to you by your health care provider. Make sure you discuss any questions you have with your health care provider. Document Revised: 10/16/2019 Document Reviewed: 10/16/2019 Elsevier Patient Education  2021 [employer].

## 2020-12-07 NOTE — Assessment & Plan Note (Signed)
Cough and congestion symptoms not well controlled.  At home Covid test negative.  Guaifenesin for cough and congestion ordered.  Medication sent to pharmacy.  Patient knows to follow-up with worsening unresolved symptoms.

## 2020-12-07 NOTE — Assessment & Plan Note (Signed)
Ongoing headache.  Patient reports headache has been ongoing in the last 6 to 8 months but he has not had time to address it due to much time caring for his wife.  Patient reports headache is frontal and rates it a moderate pain.  Headache is intermittent and has not responded well to Tylenol. Started patient on Excedrin, patient knows to follow-up with unresolved symptoms.  Rx sent to pharmacy.

## 2020-12-23 ENCOUNTER — Other Ambulatory Visit: Payer: Self-pay

## 2020-12-23 ENCOUNTER — Encounter: Payer: Self-pay | Admitting: Nurse Practitioner

## 2020-12-23 ENCOUNTER — Ambulatory Visit (INDEPENDENT_AMBULATORY_CARE_PROVIDER_SITE_OTHER): Payer: Self-pay | Admitting: Nurse Practitioner

## 2020-12-23 VITALS — BP 108/72 | HR 73 | Temp 99.3°F | Ht 65.0 in | Wt 295.0 lb

## 2020-12-23 DIAGNOSIS — R079 Chest pain, unspecified: Secondary | ICD-10-CM

## 2020-12-23 DIAGNOSIS — R059 Cough, unspecified: Secondary | ICD-10-CM

## 2020-12-23 DIAGNOSIS — J40 Bronchitis, not specified as acute or chronic: Secondary | ICD-10-CM

## 2020-12-23 MED ORDER — BUDESONIDE-FORMOTEROL FUMARATE 80-4.5 MCG/ACT IN AERO: 2.0000 | INHALATION_SPRAY | Freq: Two times a day (BID) | RESPIRATORY_TRACT | 3 refills | Status: DC

## 2020-12-23 NOTE — Patient Instructions (Signed)
Steps to Quit Smoking Smoking tobacco is the leading cause of preventable death. It can affect almost every organ in the body. Smoking puts you and people around you at risk for many serious, long-lasting (chronic) diseases. Quitting smoking can be hard, but it is one of the best things that you can do for your health. It is never too late to quit. How do I get ready to quit? When you decide to quit smoking, make a plan to help you succeed. Before you quit:  Pick a date to quit. Set a date within the next 2 weeks to give you time to prepare.  Write down the reasons why you are quitting. Keep this list in places where you will see it often.  Tell your family, friends, and co-workers that you are quitting. Their support is important.  Talk with your doctor about the choices that may help you quit.  Find out if your health insurance will pay for these treatments.  Know the people, places, things, and activities that make you want to smoke (triggers). Avoid them. What first steps can I take to quit smoking?  Throw away all cigarettes at home, at work, and in your car.  Throw away the things that you use when you smoke, such as ashtrays and lighters.  Clean your car. Make sure to empty the ashtray.  Clean your home, including curtains and carpets. What can I do to help me quit smoking? Talk with your doctor about taking medicines and seeing a counselor at the same time. You are more likely to succeed when you do both.  If you are pregnant or breastfeeding, talk with your doctor about counseling or other ways to quit smoking. Do not take medicine to help you quit smoking unless your doctor tells you to do so. To quit smoking: Quit right away  Quit smoking totally, instead of slowly cutting back on how much you smoke over a period of time.  Go to counseling. You are more likely to quit if you go to counseling sessions regularly. Take medicine You may take medicines to help you quit. Some  medicines need a prescription, and some you can buy over-the-counter. Some medicines may contain a drug called nicotine to replace the nicotine in cigarettes. Medicines may:  Help you to stop having the desire to smoke (cravings).  Help to stop the problems that come when you stop smoking (withdrawal symptoms). Your doctor may ask you to use:  Nicotine patches, gum, or lozenges.  Nicotine inhalers or sprays.  Non-nicotine medicine that is taken by mouth. Find resources Find resources and other ways to help you quit smoking and remain smoke-free after you quit. These resources are most helpful when you use them often. They include:  Online chats with a counselor.  Phone quitlines.  Printed self-help materials.  Support groups or group counseling.  Text messaging programs.  Mobile phone apps. Use apps on your mobile phone or tablet that can help you stick to your quit plan. There are many free apps for mobile phones and tablets as well as websites. Examples include Quit Guide from the CDC and smokefree.gov   What things can I do to make it easier to quit?  Talk to your family and friends. Ask them to support and encourage you.  Call a phone quitline (1-800-QUIT-NOW), reach out to support groups, or work with a counselor.  Ask people who smoke to not smoke around you.  Avoid places that make you want to smoke,   such as: ? Bars. ? Parties. ? Smoke-break areas at work.  Spend time with people who do not smoke.  Lower the stress in your life. Stress can make you want to smoke. Try these things to help your stress: ? Getting regular exercise. ? Doing deep-breathing exercises. ? Doing yoga. ? Meditating. ? Doing a body scan. To do this, close your eyes, focus on one area of your body at a time from head to toe. Notice which parts of your body are tense. Try to relax the muscles in those areas.   How will I feel when I quit smoking? Day 1 to 3 weeks Within the first 24 hours,  you may start to have some problems that come from quitting tobacco. These problems are very bad 2-3 days after you quit, but they do not often last for more than 2-3 weeks. You may get these symptoms:  Mood swings.  Feeling restless, nervous, angry, or annoyed.  Trouble concentrating.  Dizziness.  Strong desire for high-sugar foods and nicotine.  Weight gain.  Trouble pooping (constipation).  Feeling like you may vomit (nausea).  Coughing or a sore throat.  Changes in how the medicines that you take for other issues work in your body.  Depression.  Trouble sleeping (insomnia). Week 3 and afterward After the first 2-3 weeks of quitting, you may start to notice more positive results, such as:  Better sense of smell and taste.  Less coughing and sore throat.  Slower heart rate.  Lower blood pressure.  Clearer skin.  Better breathing.  Fewer sick days. Quitting smoking can be hard. Do not give up if you fail the first time. Some people need to try a few times before they succeed. Do your best to stick to your quit plan, and talk with your doctor if you have any questions or concerns. Summary  Smoking tobacco is the leading cause of preventable death. Quitting smoking can be hard, but it is one of the best things that you can do for your health.  When you decide to quit smoking, make a plan to help you succeed.  Quit smoking right away, not slowly over a period of time.  When you start quitting, seek help from your doctor, family, or friends. This information is not intended to replace advice given to you by your health care provider. Make sure you discuss any questions you have with your health care provider. Document Revised: 06/06/2019 Document Reviewed: 11/30/2018 Elsevier Patient Education  2021 Elsevier Inc.  

## 2020-12-23 NOTE — Progress Notes (Signed)
   Subjective:    Patient ID: Thomas Ball, male    DOB: [birth year], 60 y.o.   MRN: [id number]   Chief Complaint: Hypertension Pt presents today with c/o "chest discomfort" and high blood pressure reading with his cuff at home. His CP discomfort began a couple of months now. His BP's at home has been in the 130s and 140s in the AM then drops after he takes his medicine.   He does not know if his CP is exacerbated by activity. He states it is present in his mid chest across his sternum. He has not taken anything to relieve it. He had  LHC in 2020 that showed normal coronary anatomy. He is dealing with added stress with his wife's illness.   HPI    Review of Systems  Respiratory: Positive for cough and wheezing. Negative for shortness of breath.   Cardiovascular: Positive for chest pain. Negative for palpitations.  Genitourinary: Negative for difficulty urinating, dysuria and hematuria.  Neurological: Negative for dizziness, syncope and light-headedness.  Psychiatric/Behavioral: Negative for confusion. The patient is nervous/anxious.        Objective:   Physical Exam Constitutional:      Appearance: He is obese.  Cardiovascular:     Rate and Rhythm: Normal rate and regular rhythm.     Pulses: Normal pulses.     Heart sounds: Normal heart sounds.  Pulmonary:     Breath sounds: Decreased breath sounds present.  Abdominal:     General: Bowel sounds are normal.  Musculoskeletal:        General: Normal range of motion.     Cervical back: Normal range of motion and neck supple.  Skin:    General: Skin is warm and dry.     Capillary Refill: Capillary refill takes less than 2 seconds.  Neurological:     Mental Status: He is alert.    Vitals:   12/23/20 1024  BP: 108/72  Pulse: 73  Temp: 99.3 F (37.4 C)  SpO2: 95%   EKG- NSR-Mary-Margaret Hassell Done, FNP     Assessment & Plan:   Thomas Ball comes in today with chief complaint of Hypertension   Diagnosis and  orders addressed:  1. Chest pain, unspecified type If develops chest pa that will not resolve- go to the ED - EKG 12-Lead  2. Cough and bronchitis Begin symbicort 2 puffs 2 times daily.  handout on smoking cessation  Labs pending Health Maintenance reviewed Diet and exercise encouraged  Follow up plan: PRN  Dollene Primrose, RN, BSN, FNP-Student Mary-Margaret Hassell Done, FNP

## 2021-01-11 NOTE — Progress Notes (Deleted)
Subjective: CC: DM/ HTN, HLD PCP: [name], DO Thomas Ball is a 60 y.o. male presenting to clinic today for:  1. Type 2 Diabetes with hypertension, hyperlipidemia:  Glucometer:***.   High at home: ***; Low at home: ***, Taking medication(s): ***,.  Last eye exam: *** Last foot exam: *** Last A1c:  Lab Results  Component Value Date   HGBA1C 8.3 (H) 08/16/2020   Nephropathy screen indicated?: *** Last flu, zoster and/or pneumovax:  Immunization History  Administered Date(s) Administered  . Influenza Split 11/22/2012  . Influenza,inj,Quad PF,6+ Mos 07/02/2015, 09/09/2018  . Pneumococcal Polysaccharide-23 11/22/2012  . Tdap 06/11/2012    ROS: ***dizziness, LOC, polyuria, polydipsia, unintended weight loss/gain, foot ulcerations, numbness or tingling in extremities, shortness of breath or chest pain.    ROS: Per HPI  No Known Allergies Past Medical History:  Diagnosis Date  . GERD (gastroesophageal reflux disease)   . HCV (hepatitis C virus) DR. ZACKS   AUG 2013 VIRAL LOAD UNDETECTABLE  . History of cardiac catheterization    a. normal cors by cath in 09/2018 with mild pulmonary HTN  . Hypercholesteremia   . Hypertension   . Pancreatitis, gallstone   . 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 SVT (supraventricular tachycardia) (HCC)     Current Outpatient Medications:  .  albuterol (PROVENTIL) (2.5 MG/3ML) 0.083% nebulizer solution, Take 3 mLs (2.5 mg total) by nebulization every 6 (six) hours as needed for wheezing or shortness of breath., Disp: 150 mL, Rfl: 0 .  aspirin EC 81 MG tablet, Take 1 tablet (81 mg total) by mouth daily., Disp: , Rfl:  .  aspirin-acetaminophen-caffeine (EXCEDRIN MIGRAINE) 250-250-65 MG tablet, Take 1 tablet by mouth every 6 (six) hours as needed for headache., Disp: 30 tablet, Rfl: 0 .  atorvastatin (LIPITOR) 40 MG tablet, Take 1 tablet (40 mg total) by mouth daily at 6 PM., Disp: 90 tablet, Rfl: 1 .   budesonide-formoterol (SYMBICORT) 80-4.5 MCG/ACT inhaler, Inhale 2 puffs into the lungs 2 (two) times daily., Disp: 1 each, Rfl: 3 .  doxazosin (CARDURA) 1 MG tablet, Take 1 tablet (1 mg total) by mouth daily., Disp: 90 tablet, Rfl: 1 .  fluticasone (FLONASE) 50 MCG/ACT nasal spray, Place 2 sprays into both nostrils daily., Disp: 16 g, Rfl: 6 .  hydrOXYzine (ATARAX/VISTARIL) 10 MG tablet, Take 1 tablet (10 mg total) by mouth 3 (three) times daily as needed., Disp: 30 tablet, Rfl: 0 .  metFORMIN (GLUCOPHAGE) 500 MG tablet, TAKE 1 TABLET WITH BREAKFAST FOR 30 DAYSTHEN 1 TABLET TWICE DAILY WITH MEALS, Disp: 150 tablet, Rfl: 0 .  nystatin (MYCOSTATIN/NYSTOP) powder, Apply 1 application topically 3 (three) times daily. x14 days per flare, Disp: 45 g, Rfl: 3 .  omeprazole (PRILOSEC) 40 MG capsule, Take 1 capsule (40 mg total) by mouth daily. TAKE ONE (1) CAPSULE EACH DAY (Patient taking differently: Take 40 mg by mouth daily.), Disp: 90 capsule, Rfl: 1 Social History   Socioeconomic History  . Marital status: Married    Spouse name: Not on file  . Number of children: Not on file  . Years of education: Not on file  . Highest education level: Not on file  Occupational History  . Occupation: concrete work    Fish farm manager: SELF-EMPLOYED  Tobacco Use  . Smoking status: Current Every Day Smoker    Packs/day: 1.00    Years: 35.00    Pack years: 35.00    Types: Cigarettes  . Smokeless  tobacco: Never Used  . Tobacco comment: smokes 1-1.5 packs per day  Vaping Use  . Vaping Use: Never used  Substance and Sexual Activity  . Alcohol use: No  . Drug use: No    Comment: in the 1980s, smoke crack. None since Dec 2011.   Marland Kitchen Sexual activity: Not on file  Other Topics Concern  . Not on file  Social History Narrative  . Not on file   Social Determinants of Health   Financial Resource Strain: Not on file  Food Insecurity: Not on file  Transportation Needs: Not on file  Physical Activity: Not on file   Stress: Not on file  Social Connections: Not on file  Intimate Partner Violence: Not on file   Family History  Problem Relation Age of Onset  . Emphysema Mother   . COPD Mother   . Early death Mother   . Hyperlipidemia Father   . Stroke Father   . Diabetes Father   . High Cholesterol Father   . Colon cancer Neg Hx     Objective: Office vital signs reviewed. There were no vitals taken for this visit.  Physical Examination:  General: Awake, alert, *** nourished, No acute distress HEENT: Normal    Neck: No masses palpated. No lymphadenopathy    Ears: Tympanic membranes intact, normal light reflex, no erythema, no bulging    Eyes: PERRLA, extraocular membranes intact, sclera ***    Nose: nasal turbinates moist, *** nasal discharge    Throat: moist mucus membranes, no erythema, *** tonsillar exudate.  Airway is patent Cardio: regular rate and rhythm, S1S2 heard, no murmurs appreciated Pulm: clear to auscultation bilaterally, no wheezes, rhonchi or rales; normal work of breathing on room air GI: soft, non-tender, non-distended, bowel sounds present x4, no hepatomegaly, no splenomegaly, no masses GU: external vaginal tissue ***, cervix ***, *** punctate lesions on cervix appreciated, *** discharge from cervical os, *** bleeding, *** cervical motion tenderness, *** abdominal/ adnexal masses Extremities: warm, well perfused, No edema, cyanosis or clubbing; +*** pulses bilaterally MSK: *** gait and *** station Skin: dry; intact; no rashes or lesions Neuro: *** Strength and light touch sensation grossly intact, *** DTRs ***/4  Assessment/ Plan: 60 y.o. male   ***  No orders of the defined types were placed in this encounter.  No orders of the defined types were placed in this encounter.    [name], DO [place] [phone]

## 2021-01-12 ENCOUNTER — Ambulatory Visit: Payer: Self-pay | Admitting: Family Medicine

## 2021-01-12 DIAGNOSIS — N401 Enlarged prostate with lower urinary tract symptoms: Secondary | ICD-10-CM

## 2021-01-12 DIAGNOSIS — E1159 Type 2 diabetes mellitus with other circulatory complications: Secondary | ICD-10-CM

## 2021-01-12 DIAGNOSIS — E1169 Type 2 diabetes mellitus with other specified complication: Secondary | ICD-10-CM

## 2021-01-13 ENCOUNTER — Encounter: Payer: Self-pay | Admitting: Family Medicine

## 2021-01-17 ENCOUNTER — Other Ambulatory Visit: Payer: Self-pay | Admitting: Family Medicine

## 2021-01-17 DIAGNOSIS — E7800 Pure hypercholesterolemia, unspecified: Secondary | ICD-10-CM

## 2021-01-18 ENCOUNTER — Other Ambulatory Visit: Payer: Self-pay | Admitting: Family Medicine

## 2021-01-18 DIAGNOSIS — R351 Nocturia: Secondary | ICD-10-CM

## 2021-01-18 DIAGNOSIS — N401 Enlarged prostate with lower urinary tract symptoms: Secondary | ICD-10-CM

## 2021-01-18 MED ORDER — DOXAZOSIN MESYLATE 1 MG PO TABS: 1.0000 mg | ORAL_TABLET | Freq: Every day | ORAL | 1 refills | Status: DC

## 2021-02-09 ENCOUNTER — Other Ambulatory Visit: Payer: Self-pay

## 2021-02-09 ENCOUNTER — Ambulatory Visit (INDEPENDENT_AMBULATORY_CARE_PROVIDER_SITE_OTHER): Payer: Self-pay | Admitting: Internal Medicine

## 2021-02-09 ENCOUNTER — Other Ambulatory Visit: Payer: Self-pay | Admitting: *Deleted

## 2021-02-09 ENCOUNTER — Encounter: Payer: Self-pay | Admitting: *Deleted

## 2021-02-09 ENCOUNTER — Encounter: Payer: Self-pay | Admitting: Internal Medicine

## 2021-02-09 VITALS — BP 121/78 | HR 61 | Temp 96.9°F | Ht 65.0 in | Wt 293.4 lb

## 2021-02-09 DIAGNOSIS — K7469 Other cirrhosis of liver: Secondary | ICD-10-CM

## 2021-02-09 DIAGNOSIS — D369 Benign neoplasm, unspecified site: Secondary | ICD-10-CM

## 2021-02-09 DIAGNOSIS — Z8619 Personal history of other infectious and parasitic diseases: Secondary | ICD-10-CM

## 2021-02-09 MED ORDER — PEG 3350-KCL-NA BICARB-NACL 420 G PO SOLR: ORAL | 0 refills | Status: DC

## 2021-02-09 NOTE — Progress Notes (Signed)
Primary Care Physician:  [name], DO Primary Gastroenterologist:  Dr. Abbey Chatters  Chief Complaint  Patient presents with  . Cirrhosis    HPI:   Thomas Ball is a 60 y.o. male who presents to clinic today as a new patient.  Last seen 2016 for cirrhosis.  History of well compensated cirrhosis likely due to hepatitis C and fatty liver disease.  He is status post treatment for his hepatitis C in 2012 with PEG interferon as well as ribavirin with negative HCVRNA in 2013.  Patient states that he has not seen a gastroenterologist in many years.  States that with COVID and taking care of his wife who unfortunately died of cancer a few weeks ago took him the majority of time.  He would like to reestablish care going forward.  Last EGD 09/04/2014 did not show esophageal varices.  Last ultrasound that I can find was 2015 which did not show any liver lesions.  Last colonoscopy 2014 with adenomas removed.  No family history of colorectal malignancy.  Overall today patient states he is doing well.  Denies any swelling in his legs or abdomen.  No confusion or fatigue.  No melena hematochezia.  Past Medical History:  Diagnosis Date  . GERD (gastroesophageal reflux disease)   . HCV (hepatitis C virus) DR. ZACKS   AUG 2013 VIRAL LOAD UNDETECTABLE  . History of cardiac catheterization    a. normal cors by cath in 09/2018 with mild pulmonary HTN  . Hypercholesteremia   . Hypertension   . Pancreatitis, gallstone   . 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 SVT (supraventricular tachycardia) (HCC)     Past Surgical History:  Procedure Laterality Date  . 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 CHOLECYSTECTOMY    . COLONOSCOPY  10/04/2012   SLF:ONE/8 SIMPLE ADENOMA, TICS, SML IH  . ESOPHAGOGASTRODUODENOSCOPY N/A 09/04/2014   Procedure: ESOPHAGOGASTRODUODENOSCOPY (EGD);  Surgeon: Danie Binder, MD;  Location: [facility];  Service: Endoscopy;  Laterality: N/A;   145  . GALLBLADDER SURGERY    . JAW BROKEN    . LIPOMA REMOVALS     SEVERAL  . RIGHT/LEFT HEART CATH AND CORONARY ANGIOGRAPHY N/A 10/08/2018   Procedure: RIGHT/LEFT HEART CATH AND CORONARY ANGIOGRAPHY;  Surgeon: Martinique, Peter M, MD;  Location: [facility];  Service: Cardiovascular;  Laterality: N/A;  . SUPRAVENTRICULAR TACHYCARDIA ABLATION N/A 06/22/2014   Procedure: SUPRAVENTRICULAR TACHYCARDIA ABLATION;  Surgeon: Evans Lance, MD;  Location: [facility] C[facility];  Service: Cardiovascular;  Laterality: N/A;    Current Outpatient Medications  Medication Sig Dispense Refill  . albuterol (PROVENTIL) (2.5 MG/3ML) 0.083% nebulizer solution Take 3 mLs (2.5 mg total) by nebulization every 6 (six) hours as needed for wheezing or shortness of breath. 150 mL 0  . aspirin EC 81 MG tablet Take 1 tablet (81 mg total) by mouth daily.    Marland Kitchen aspirin-acetaminophen-caffeine (EXCEDRIN MIGRAINE) 250-250-65 MG tablet Take 1 tablet by mouth every 6 (six) hours as needed for headache. 30 tablet 0  . atorvastatin (LIPITOR) 40 MG tablet TAKE 1 TABLET DAILY AT 6PM 90 tablet 0  . doxazosin (CARDURA) 1 MG tablet Take 1 tablet (1 mg total) by mouth daily. 90 tablet 1  . metFORMIN (GLUCOPHAGE) 500 MG tablet TAKE 1 TABLET WITH BREAKFAST FOR 30 DAYSTHEN 1 TABLET TWICE DAILY WITH MEALS 150 tablet 0  . omeprazole (PRILOSEC) 40 MG capsule TAKE ONE (1)  CAPSULE EACH DAY 90 capsule 0  . budesonide-formoterol (SYMBICORT) 80-4.5 MCG/ACT inhaler Inhale 2 puffs into the lungs 2 (two) times daily. (Patient not taking: Reported on 02/09/2021) 1 each 3  . fluticasone (FLONASE) 50 MCG/ACT nasal spray Place 2 sprays into both nostrils daily. (Patient not taking: Reported on 02/09/2021) 16 g 6  . hydrOXYzine (ATARAX/VISTARIL) 10 MG tablet Take 1 tablet (10 mg total) by mouth 3 (three) times daily as needed. (Patient not taking: Reported on 02/09/2021) 30 tablet 0  . nystatin (MYCOSTATIN/NYSTOP) powder Apply 1 application topically 3 (three)  times daily. x14 days per flare (Patient not taking: Reported on 02/09/2021) 45 g 3   No current facility-administered medications for this visit.    Allergies as of 02/09/2021  . (No Known Allergies)    Family History  Problem Relation Age of Onset  . Emphysema Mother   . COPD Mother   . Early death Mother   . Hyperlipidemia Father   . Stroke Father   . Diabetes Father   . High Cholesterol Father   . Colon cancer Neg Hx     Social History   Socioeconomic History  . Marital status: Married    Spouse name: Not on file  . Number of children: Not on file  . Years of education: Not on file  . Highest education level: Not on file  Occupational History  . Occupation: concrete work    Fish farm manager: SELF-EMPLOYED  Tobacco Use  . Smoking status: Current Every Day Smoker    Packs/day: 1.00    Years: 35.00    Pack years: 35.00    Types: Cigarettes  . Smokeless tobacco: Never Used  . Tobacco comment: smokes 1-1.5 packs per day  Vaping Use  . Vaping Use: Never used  Substance and Sexual Activity  . Alcohol use: No  . Drug use: No    Comment: in the 1980s, smoke crack. None since Dec 2011.   Marland Kitchen Sexual activity: Not on file  Other Topics Concern  . Not on file  Social History Narrative  . Not on file   Social Determinants of Health   Financial Resource Strain: Not on file  Food Insecurity: Not on file  Transportation Needs: Not on file  Physical Activity: Not on file  Stress: Not on file  Social Connections: Not on file  Intimate Partner Violence: Not on file    Subjective: Review of Systems  Constitutional: Negative for chills and fever.  HENT: Negative for congestion and hearing loss.   Eyes: Negative for blurred vision and double vision.  Respiratory: Negative for cough and shortness of breath.   Cardiovascular: Negative for chest pain and palpitations.  Gastrointestinal: Negative for abdominal pain, blood in stool, constipation, diarrhea, heartburn, melena and  vomiting.  Genitourinary: Negative for dysuria and urgency.  Musculoskeletal: Negative for joint pain and myalgias.  Skin: Negative for itching and rash.  Neurological: Negative for dizziness and headaches.  Psychiatric/Behavioral: Negative for depression. The patient is not nervous/anxious.        Objective: BP 121/78   Pulse 61   Temp (!) 96.9 F (36.1 C) (Temporal)   Ht 5\' 5"  (1.651 m)   Wt 293 lb 6.4 oz (133.1 kg)   BMI 48.82 kg/m  Physical Exam Constitutional:      Appearance: Normal appearance. He is obese.  HENT:     Head: Normocephalic and atraumatic.  Eyes:     Extraocular Movements: Extraocular movements intact.     Conjunctiva/sclera: Conjunctivae normal.  Cardiovascular:     Rate and Rhythm: Normal rate and regular rhythm.  Pulmonary:     Effort: Pulmonary effort is normal.     Breath sounds: Normal breath sounds.  Abdominal:     General: Bowel sounds are normal.     Palpations: Abdomen is soft.  Musculoskeletal:        General: Normal range of motion.     Cervical back: Normal range of motion and neck supple.  Skin:    General: Skin is warm.  Neurological:     General: No focal deficit present.     Mental Status: He is alert and oriented to person, place, and time.  Psychiatric:        Mood and Affect: Mood normal.        Behavior: Behavior normal.      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  Counseled him that we need to at least see him every 6 months and he understands.  He would like to reestablish care with [place] today.  We will check ultrasound and AFP today for [facility] screening  We will schedule for EGD for variceal screening.  At the same time we will perform colonoscopy for surveillance purposes due to personal history of adenomatous colon  polyps.  I will check meld labs today as well as HCVRNA   Counseled on 2 g low-sodium diet  Counseled on weight loss  Patient follow-up after procedures.  02/09/2021 9:08 AM   Disclaimer: This note was dictated with voice recognition software. Similar sounding words can inadvertently be transcribed and may not be corrected upon review.

## 2021-02-09 NOTE — Patient Instructions (Signed)
We will schedule you for upper endoscopy to evaluate for esophageal varices (blood vessels in your esophagus).  At the same time we will perform colonoscopy due to history of colon polyps.  I am going to check blood work today in regards to your liver.  We will also order an ultrasound to evaluate your liver as you are at a slightly increased risk of developing liver cancer.  We will do this every 6 months.  At [facility] we value your feedback. You may receive a survey about your visit today. Please share your experience as we strive to create trusting relationships with our patients to provide genuine, compassionate, quality care.  We appreciate your understanding and patience as we review any laboratory studies, imaging, and other diagnostic tests that are ordered as we care for you. Our office policy is 5 business days for review of these results, and any emergent or urgent results are addressed in a timely manner for your best interest. If you do not hear from our office in 1 week, please contact us.   We also encourage the use of MyChart, which contains your medical information for your review as well. If you are not enrolled in this feature, an access code is on this after visit summary for your convenience. Thank you for allowing [place] to be involved in your care.  It was great to see you today!  I hope you have a great rest of your spring!!    Elon Alas. Abbey Chatters, D.O. Gastroenterology and Hepatology [facility]

## 2021-02-14 ENCOUNTER — Ambulatory Visit (HOSPITAL_COMMUNITY): Payer: Self-pay

## 2021-02-22 ENCOUNTER — Telehealth: Payer: Self-pay | Admitting: Internal Medicine

## 2021-02-22 NOTE — Telephone Encounter (Signed)
Noted put up front for the pt

## 2021-02-22 NOTE — Telephone Encounter (Signed)
Pt lost his lab orders and wants another copy. He is going to come by office to pick up.

## 2021-02-23 ENCOUNTER — Ambulatory Visit (INDEPENDENT_AMBULATORY_CARE_PROVIDER_SITE_OTHER): Payer: Self-pay | Admitting: Internal Medicine

## 2021-02-23 ENCOUNTER — Encounter: Payer: Self-pay | Admitting: Internal Medicine

## 2021-02-23 ENCOUNTER — Other Ambulatory Visit: Payer: Self-pay

## 2021-02-23 VITALS — BP 112/74 | HR 69 | Ht 65.0 in | Wt 291.0 lb

## 2021-02-23 DIAGNOSIS — R0609 Other forms of dyspnea: Secondary | ICD-10-CM

## 2021-02-23 DIAGNOSIS — R0600 Dyspnea, unspecified: Secondary | ICD-10-CM

## 2021-02-23 NOTE — Progress Notes (Signed)
HPI Thomas Ball returns today for followup. He is a pleasant morbidly obese 60 yo man with SVT s/p ablation and preserved LV function s/p catheterization. In the interim his dyspnea has improved somewhat. He has no obstructive CAD by heart cath. He has lost 12 pounds since his last visit. He notes that he lost his wife after a long battle with cancer. He has had PFTs which were described as normal. He has mild pulmonary HTN. He thinks that his palpitations have improved in the past few weeks after the stress of his wife's illness is resolving.  No Known Allergies   Current Outpatient Medications  Medication Sig Dispense Refill  . albuterol (PROVENTIL) (2.5 MG/3ML) 0.083% nebulizer solution Take 3 mLs (2.5 mg total) by nebulization every 6 (six) hours as needed for wheezing or shortness of breath. 150 mL 0  . aspirin EC 81 MG tablet Take 1 tablet (81 mg total) by mouth daily.    Marland Kitchen aspirin-acetaminophen-caffeine (EXCEDRIN MIGRAINE) 250-250-65 MG tablet Take 1 tablet by mouth every 6 (six) hours as needed for headache. 30 tablet 0  . atorvastatin (LIPITOR) 40 MG tablet TAKE 1 TABLET DAILY AT 6PM 90 tablet 0  . budesonide-formoterol (SYMBICORT) 80-4.5 MCG/ACT inhaler Inhale 2 puffs into the lungs 2 (two) times daily. 1 each 3  . doxazosin (CARDURA) 1 MG tablet Take 1 tablet (1 mg total) by mouth daily. 90 tablet 1  . fluticasone (FLONASE) 50 MCG/ACT nasal spray Place 2 sprays into both nostrils daily. 16 g 6  . hydrOXYzine (ATARAX/VISTARIL) 10 MG tablet Take 1 tablet (10 mg total) by mouth 3 (three) times daily as needed. 30 tablet 0  . metFORMIN (GLUCOPHAGE) 500 MG tablet TAKE 1 TABLET WITH BREAKFAST FOR 30 DAYSTHEN 1 TABLET TWICE DAILY WITH MEALS 150 tablet 0  . nystatin (MYCOSTATIN/NYSTOP) powder Apply 1 application topically 3 (three) times daily. x14 days per flare 45 g 3  . omeprazole (PRILOSEC) 40 MG capsule TAKE ONE (1) CAPSULE EACH DAY 90 capsule 0  . polyethylene glycol-electrolytes  (NULYTELY) 420 g solution As directed 4000 mL 0   No current facility-administered medications for this visit.     Past Medical History:  Diagnosis Date  . GERD (gastroesophageal reflux disease)   . HCV (hepatitis C virus) DR. ZACKS   AUG 2013 VIRAL LOAD UNDETECTABLE  . History of cardiac catheterization    a. normal cors by cath in 09/2018 with mild pulmonary HTN  . Hypercholesteremia   . Hypertension   . Pancreatitis, gallstone   . 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 SVT (supraventricular tachycardia) (HCC)     ROS:   All systems reviewed and negative except as noted in the HPI.   Past Surgical History:  Procedure Laterality Date  . 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 CHOLECYSTECTOMY    . COLONOSCOPY  10/04/2012   SLF:ONE/8 SIMPLE ADENOMA, TICS, SML IH  . ESOPHAGOGASTRODUODENOSCOPY N/A 09/04/2014   Procedure: ESOPHAGOGASTRODUODENOSCOPY (EGD);  Surgeon: Danie Binder, MD;  Location: [facility];  Service: Endoscopy;  Laterality: N/A;  145  . GALLBLADDER SURGERY    . JAW BROKEN    . LIPOMA REMOVALS     SEVERAL  . RIGHT/LEFT HEART CATH AND CORONARY ANGIOGRAPHY N/A 10/08/2018   Procedure: RIGHT/LEFT HEART CATH AND CORONARY ANGIOGRAPHY;  Surgeon: Martinique, Peter M, MD;  Location: [place] [facility];  Service: Cardiovascular;  Laterality: N/A;  .  SUPRAVENTRICULAR TACHYCARDIA ABLATION N/A 06/22/2014   Procedure: SUPRAVENTRICULAR TACHYCARDIA ABLATION;  Surgeon: Evans Lance, MD;  Location: [facility] C[facility];  Service: Cardiovascular;  Laterality: N/A;     Family History  Problem Relation Age of Onset  . Emphysema Mother   . COPD Mother   . Early death Mother   . Hyperlipidemia Father   . Stroke Father   . Diabetes Father   . High Cholesterol Father   . Colon cancer Neg Hx      Social History   Socioeconomic History  . Marital status: Widowed    Spouse name: Not on file  . Number of children: Not on file  . Years of education: Not on file   . Highest education level: Not on file  Occupational History  . Occupation: concrete work    Fish farm manager: SELF-EMPLOYED  Tobacco Use  . Smoking status: Current Every Day Smoker    Packs/day: 1.00    Years: 35.00    Pack years: 35.00    Types: Cigarettes  . Smokeless tobacco: Never Used  . Tobacco comment: smokes 1-1.5 packs per day  Vaping Use  . Vaping Use: Never used  Substance and Sexual Activity  . Alcohol use: No  . Drug use: No    Comment: in the 1980s, smoke crack. None since Dec 2011.   Marland Kitchen Sexual activity: Not on file  Other Topics Concern  . Not on file  Social History Narrative  . Not on file   Social Determinants of Health   Financial Resource Strain: Not on file  Food Insecurity: Not on file  Transportation Needs: Not on file  Physical Activity: Not on file  Stress: Not on file  Social Connections: Not on file  Intimate Partner Violence: Not on file     BP 112/74   Pulse 69   Ht 5\' 5"  (1.651 m)   Wt 291 lb (132 kg)   SpO2 93%   BMI 48.42 kg/m   Physical Exam:  obese appearing NAD HEENT: Unremarkable Neck:  No JVD, no thyromegally Lymphatics:  No adenopathy Back:  No CVA tenderness Lungs:  Clear with no wheezes HEART:  Regular rate rhythm, no murmurs, no rubs, no clicks Abd:  soft, morbidly obese, positive bowel sounds, no organomegally, no rebound, no guarding Ext:  2 plus pulses, no edema, no cyanosis, no clubbing Skin:  No rashes no nodules Neuro:  CN II through XII intact, motor grossly intact  DEVICE  Normal device function.  See PaceArt for details.   Assess/Plan: 1. Obesity - this is really his biggest problem. His BMI is 48! I have strongly encouraged him to lose weight. We discussed fasting and increasing his activity.  2. SVT - he is s/p catheter ablation and has had no evidence of more SVT. He is certainly at risk for atrial fib though has no documentation of this. 3. Dyspnea - this has improved some. See above. He is encouraged to  avoid salty foods. 4. Chest pain - no obstructive disease. His symptoms are currently quiet. He thinks that it may have been related to his stress level.  Carleene Overlie Hiroki Wint,MD

## 2021-02-23 NOTE — Patient Instructions (Signed)
Medication Instructions:  Your physician recommends that you continue on your current medications as directed. Please refer to the Current Medication list given to you today.  *If you need a refill on your cardiac medications before your next appointment, please call your pharmacy*   Lab Work: None If you have labs (blood work) drawn today and your tests are completely normal, you will receive your results only by: Marland Kitchen MyChart Message (if you have MyChart) OR . A paper copy in the mail If you have any lab test that is abnormal or we need to change your treatment, we will call you to review the results.   Testing/Procedures: None   Follow-Up: At [facility], you and your health needs are our priority.  As part of our continuing mission to provide you with exceptional heart care, we have created designated Provider Care Teams.  These Care Teams include your primary Cardiologist (physician) and Advanced Practice Providers (APPs -  Physician Assistants and Nurse Practitioners) who all work together to provide you with the care you need, when you need it.  We recommend signing up for the patient portal called "MyChart".  Sign up information is provided on this After Visit Summary.  MyChart is used to connect with patients for Virtual Visits (Telemedicine).  Patients are able to view lab/test results, encounter notes, upcoming appointments, etc.  Non-urgent messages can be sent to your provider as well.   To learn more about what you can do with MyChart, go to [email or web address].    Your next appointment:   12 month(s)  The format for your next appointment:   In Person  Provider:   Cristopher Peru, MD   Other Instructions

## 2021-02-28 ENCOUNTER — Other Ambulatory Visit: Payer: Self-pay

## 2021-02-28 ENCOUNTER — Other Ambulatory Visit: Payer: Self-pay | Admitting: Family Medicine

## 2021-02-28 ENCOUNTER — Ambulatory Visit (HOSPITAL_COMMUNITY)
Admission: RE | Admit: 2021-02-28 | Discharge: 2021-02-28 | Disposition: A | Discharge status: Home / Self Care | Payer: Self-pay | Source: Ambulatory Visit | Attending: Internal Medicine | Admitting: Internal Medicine

## 2021-02-28 ENCOUNTER — Other Ambulatory Visit (HOSPITAL_COMMUNITY)
Admission: RE | Admit: 2021-02-28 | Discharge: 2021-02-28 | Disposition: A | Discharge status: Home / Self Care | Payer: Self-pay | Source: Ambulatory Visit | Attending: Internal Medicine | Admitting: Internal Medicine

## 2021-02-28 DIAGNOSIS — K7469 Other cirrhosis of liver: Secondary | ICD-10-CM | POA: Insufficient documentation

## 2021-02-28 DIAGNOSIS — E119 Type 2 diabetes mellitus without complications: Secondary | ICD-10-CM

## 2021-02-28 DIAGNOSIS — Z8619 Personal history of other infectious and parasitic diseases: Secondary | ICD-10-CM | POA: Insufficient documentation

## 2021-02-28 LAB — COMPREHENSIVE METABOLIC PANEL
ALT: 32 U/L (ref 0–44)
AST: 20 U/L (ref 15–41)
Albumin: 4.1 g/dL (ref 3.5–5.0)
Alkaline Phosphatase: 21 U/L — ABNORMAL LOW (ref 38–126)
Anion gap: 8 (ref 5–15)
BUN: 17 mg/dL (ref 6–20)
CO2: 25 mmol/L (ref 22–32)
Calcium: 8.5 mg/dL — ABNORMAL LOW (ref 8.9–10.3)
Chloride: 103 mmol/L (ref 98–111)
Creatinine, Ser: 0.83 mg/dL (ref 0.61–1.24)
GFR, Estimated: >60 mL/min (ref >60)
Glucose, Bld: 161 mg/dL — ABNORMAL HIGH (ref 70–99)
Potassium: 4.2 mmol/L (ref 3.5–5.1)
Sodium: 136 mmol/L (ref 135–145)
Total Bilirubin: 1 mg/dL (ref 0.3–1.2)
Total Protein: 6.9 g/dL (ref 6.5–8.1)

## 2021-02-28 LAB — PROTIME-INR
INR: 1 (ref 0.8–1.2)
Prothrombin Time: 13.6 seconds (ref 11.4–15.2)

## 2021-03-01 LAB — HCV RNA QUANT: HCV Quantitative: NOT DETECTED IU/mL (ref >50)

## 2021-03-01 LAB — AFP TUMOR MARKER: AFP, Serum, Tumor Marker: 2.7 ng/mL (ref 0.0–8.4)

## 2021-03-08 ENCOUNTER — Ambulatory Visit (HOSPITAL_COMMUNITY)
Admission: RE | Admit: 2021-03-08 | Discharge: 2021-03-08 | Disposition: A | Discharge status: Home / Self Care | Payer: Self-pay | Source: Ambulatory Visit | Attending: Family Medicine | Admitting: Family Medicine

## 2021-03-08 ENCOUNTER — Ambulatory Visit (INDEPENDENT_AMBULATORY_CARE_PROVIDER_SITE_OTHER): Payer: Self-pay | Admitting: Family Medicine

## 2021-03-08 ENCOUNTER — Encounter: Payer: Self-pay | Admitting: Family Medicine

## 2021-03-08 ENCOUNTER — Ambulatory Visit: Payer: Self-pay | Admitting: Family Medicine

## 2021-03-08 ENCOUNTER — Other Ambulatory Visit: Payer: Self-pay

## 2021-03-08 VITALS — BP 115/74 | HR 66 | Temp 97.2°F | Ht 65.0 in | Wt 289.2 lb

## 2021-03-08 DIAGNOSIS — M79662 Pain in left lower leg: Secondary | ICD-10-CM

## 2021-03-08 NOTE — Progress Notes (Signed)
Assessment & Plan:  1. Pain of left calf Ibuprofen for pain. U/S to rule in or out DVT.  - US Venous Img Lower Unilateral Left; Future   Follow up plan: Return if symptoms worsen or fail to improve.  Hendricks Limes, MSN, APRN, FNP-C [facility]  Subjective:   Patient ID: Thomas Ball, male    DOB: 01-13-1961, 60 y.o.   MRN: [id number]  HPI: Thomas Ball is a 60 y.o. male presenting on 03/08/2021 for Leg Pain (Left calf x 1 day)  Patient reports left calf pain that started suddenly yesterday at work.  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  Denies any injury or strenuous activity.  Pain is somewhat better today after resting it last night.  He is not taking any medications to treat the pain.   ROS: Negative unless specifically indicated above in HPI.   Relevant past medical history reviewed and updated as indicated.   Allergies and medications reviewed and updated.   Current Outpatient Medications:    albuterol (PROVENTIL) (2.5 MG/3ML) 0.083% nebulizer solution, Take 3 mLs (2.5 mg total) by nebulization every 6 (six) hours as needed for wheezing or shortness of breath., Disp: 150 mL, Rfl: 0   aspirin EC 81 MG tablet, Take 1 tablet (81 mg total) by mouth daily., Disp: , Rfl:    aspirin-acetaminophen-caffeine (EXCEDRIN MIGRAINE) 250-250-65 MG tablet, Take 1 tablet by mouth every 6 (six) hours as needed for headache., Disp: 30 tablet, Rfl: 0   atorvastatin (LIPITOR) 40 MG tablet, TAKE 1 TABLET DAILY AT 6PM, Disp: 90 tablet, Rfl: 0   budesonide-formoterol (SYMBICORT) 80-4.5 MCG/ACT inhaler, Inhale 2 puffs into the lungs 2 (two) times daily., Disp: 1 each, Rfl: 3   doxazosin (CARDURA) 1 MG tablet, Take 1 tablet (1 mg total) by mouth daily., Disp: 90 tablet, Rfl: 1   fluticasone (FLONASE) 50 MCG/ACT nasal spray, Place 2 sprays into both nostrils daily., Disp: 16 g, Rfl: 6   hydrOXYzine (ATARAX/VISTARIL) 10 MG  tablet, Take 1 tablet (10 mg total) by mouth 3 (three) times daily as needed., Disp: 30 tablet, Rfl: 0   metFORMIN (GLUCOPHAGE) 500 MG tablet, Take 1 tablet (500 mg total) by mouth 2 (two) times daily with a meal., Disp: 180 tablet, Rfl: 3   nystatin (MYCOSTATIN/NYSTOP) powder, Apply 1 application topically 3 (three) times daily. x14 days per flare, Disp: 45 g, Rfl: 3   omeprazole (PRILOSEC) 40 MG capsule, TAKE ONE (1) CAPSULE EACH DAY, Disp: 90 capsule, Rfl: 0   polyethylene glycol-electrolytes (NULYTELY) 420 g solution, As directed, Disp: 4000 mL, Rfl: 0  No Known Allergies  Objective:   BP 115/74   Pulse 66   Temp (!) 97.2 F (36.2 C) (Temporal)   Ht 5\' 5"  (1.651 m)   Wt 289 lb 3.2 oz (131.2 kg)   SpO2 95%   BMI 48.13 kg/m    Physical Exam Vitals reviewed.  Constitutional:      General: He is not in acute distress.    Appearance: Normal appearance. He is not ill-appearing, toxic-appearing or diaphoretic.  HENT:     Head: Normocephalic and atraumatic.  Eyes:     General: No scleral icterus.       Right eye: No discharge.        Left eye: No discharge.     Conjunctiva/sclera: Conjunctivae normal.  Cardiovascular:     Rate and Rhythm: Normal rate.  Pulmonary:     Effort: Pulmonary effort is normal. No respiratory distress.  Musculoskeletal:        General: Normal range of motion.     Cervical back: Normal range of motion.     Left lower leg: Tenderness present. No swelling, deformity, lacerations or bony tenderness. No edema.  Skin:    General: Skin is warm and dry.  Neurological:     Mental Status: He is alert and oriented to person, place, and time. Mental status is at baseline.  Psychiatric:        Mood and Affect: Mood normal.        Behavior: Behavior normal.        Thought Content: Thought content normal.        Judgment: Judgment normal.

## 2021-03-09 ENCOUNTER — Encounter: Payer: Self-pay | Admitting: Family Medicine

## 2021-03-09 ENCOUNTER — Ambulatory Visit: Payer: Self-pay | Admitting: Family Medicine

## 2021-03-14 ENCOUNTER — Encounter: Payer: Self-pay | Admitting: *Deleted

## 2021-03-14 ENCOUNTER — Telehealth: Payer: Self-pay | Admitting: Internal Medicine

## 2021-03-14 NOTE — Telephone Encounter (Signed)
Spoke with pt and has been rescheduled. New appt info scheduled

## 2021-03-14 NOTE — Telephone Encounter (Signed)
PATIENT CALLED AND SAID THEY HE HAS HAD A LOT OF ISSUES COME UP AND WANTS TO RESCHEDULE HIS PROCEDURE

## 2021-03-18 ENCOUNTER — Encounter (HOSPITAL_COMMUNITY): Payer: Self-pay

## 2021-03-18 ENCOUNTER — Other Ambulatory Visit (HOSPITAL_COMMUNITY): Payer: Self-pay

## 2021-04-06 ENCOUNTER — Telehealth: Payer: Self-pay

## 2021-04-06 NOTE — Telephone Encounter (Signed)
Pt called office and LMOVM.  Called pt back, TCS/EGD rescheduled to 05/09/21 at 7:30am. Endo scheduler informed.

## 2021-04-06 NOTE — Telephone Encounter (Signed)
Pre-op appt 05/05/21. Appt letter mailed with new procedure instructions.

## 2021-04-11 ENCOUNTER — Encounter: Payer: Self-pay | Admitting: Family

## 2021-04-11 ENCOUNTER — Ambulatory Visit (INDEPENDENT_AMBULATORY_CARE_PROVIDER_SITE_OTHER): Payer: Self-pay | Admitting: Family

## 2021-04-11 DIAGNOSIS — U071 COVID-19: Secondary | ICD-10-CM

## 2021-04-11 DIAGNOSIS — R059 Cough, unspecified: Secondary | ICD-10-CM

## 2021-04-11 MED ORDER — BENZONATATE 200 MG PO CAPS: 200.0000 mg | ORAL_CAPSULE | Freq: Three times a day (TID) | ORAL | 1 refills | Status: DC | PRN

## 2021-04-11 MED ORDER — ALBUTEROL SULFATE HFA 108 (90 BASE) MCG/ACT IN AERS: 2.0000 | INHALATION_SPRAY | Freq: Four times a day (QID) | RESPIRATORY_TRACT | 0 refills | Status: DC | PRN

## 2021-04-11 MED ORDER — MOLNUPIRAVIR EUA 200MG CAPSULE: 4.0000 | ORAL_CAPSULE | Freq: Two times a day (BID) | ORAL | 0 refills | Status: AC

## 2021-04-11 NOTE — Progress Notes (Addendum)
Virtual Visit  Note Due to COVID-19 pandemic this visit was conducted virtually. This visit type was conducted due to national recommendations for restrictions regarding the COVID-19 Pandemic (e.g. social distancing, sheltering in place) in an effort to limit this patient's exposure and mitigate transmission in our community. All issues noted in this document were discussed and addressed.  A physical exam was not performed with this format.  I connected with Thomas Ball on 04/11/21 at 11:34 AM by telephone and verified that I am speaking with the correct person using two identifiers. Thomas Ball is currently located at home and no one is currently with him during visit. The provider, Evelina Dun, FNP is located in their office at time of visit.  I discussed the limitations, risks, security and privacy concerns of performing an evaluation and management service by telephone and the availability of in person appointments. I also discussed with the patient that there may be a patient responsible charge related to this service. The patient expressed understanding and agreed to proceed.  [name], Ball are scheduled for a virtual visit with your provider today.    Just as we do with appointments in the office, we must obtain your consent to participate.  Your consent will be active for this visit and any virtual visit you may have with one of our providers in the next 365 days.    If you have a MyChart account, I can also send a copy of this consent to you electronically.  All virtual visits are billed to your insurance company just like a traditional visit in the office.  As this is a virtual visit, video technology does not allow for your provider to perform a traditional examination.  This may limit your provider's ability to fully assess your condition.  If your provider identifies any concerns that need to be evaluated in person or the need to arrange testing such as labs, EKG, etc, we will  make arrangements to do so.    Although advances in technology are sophisticated, we cannot ensure that it will always work on either your end or our end.  If the connection with a video visit is poor, we may have to switch to a telephone visit.  With either a video or telephone visit, we are not always able to ensure that we have a secure connection.   I need to obtain your verbal consent now.   Are you willing to proceed with your visit today?   [facility] has provided verbal consent on 04/11/2021 for a virtual visit (video or telephone).   Evelina Dun, [facility] 04/11/2021  11:35 AM     History and Present Illness:  Pt calls the office today with complaints of cough that started Friday. States he bought a COVID test for home, but "messed it up".  Cough This is a new problem. The current episode started in the past 7 days. The cough is Non-productive. Associated symptoms include chills, headaches, myalgias, nasal congestion, postnasal drip and shortness of breath. Pertinent negatives include no ear congestion, ear pain, fever, sore throat or wheezing. Risk factors for lung disease include smoking/tobacco exposure. He has tried rest for the symptoms. The treatment provided mild relief.     Review of Systems  Constitutional:  Positive for chills. Negative for fever.  HENT:  Positive for postnasal drip. Negative for ear pain and sore throat.   Respiratory:  Positive for cough and shortness of breath. Negative for wheezing.   Musculoskeletal:  Positive for myalgias.  Neurological:  Positive for headaches.  All other systems reviewed and are negative.   Observations/Objective: Coarse cough, no SOB or wheezing noted.   Assessment and Plan: 1. Cough Pt will go get home COVID test and let us know results. If negative, will treat like COPD/bronchitis. If positive, will send in molnupiravir.  Rest Force fluids  Tylenol     Addendum: PT calls back and his home test is positive.     2. COVID-19 virus detected COVID positive, rest, force fluids, tylenol as needed, Quarantine for at least 5 days and fever free, report any worsening symptoms such as increased shortness of breath, swelling, or continued high fevers.  Adverse effects discussed  - molnupiravir EUA 200 mg CAPS; Take 4 capsules (800 mg total) by mouth 2 (two) times daily for 5 days.  Dispense: 40 capsule; Refill: 0 - benzonatate (TESSALON) 200 MG capsule; Take 1 capsule (200 mg total) by mouth 3 (three) times daily as needed.  Dispense: 30 capsule; Refill: 1 - albuterol (VENTOLIN HFA) 108 (90 Base) MCG/ACT inhaler; Inhale 2 puffs into the lungs every 6 (six) hours as needed for wheezing or shortness of breath.  Dispense: 8 g; Refill: 0  I discussed the assessment and treatment plan with the patient. The patient was provided an opportunity to ask questions and all were answered. The patient agreed with the plan and demonstrated an understanding of the instructions.   The patient was advised to call back or seek an in-person evaluation if the symptoms worsen or if the condition fails to improve as anticipated.  The above assessment and management plan was discussed with the patient. The patient verbalized understanding of and has agreed to the management plan. Patient is aware to call the clinic if symptoms persist or worsen. Patient is aware when to return to the clinic for a follow-up visit. Patient educated on when it is appropriate to go to the emergency department.   Time call ended: 11:47 pm    I provided 13 minutes of  non face-to-face time during this encounter.    Evelina Dun, FNP

## 2021-04-11 NOTE — Addendum Note (Signed)
Addended by: Evelina Dun A on: 04/11/2021 02:42 PM   Modules accepted: Orders

## 2021-04-14 ENCOUNTER — Encounter (HOSPITAL_COMMUNITY): Payer: Self-pay

## 2021-04-14 ENCOUNTER — Ambulatory Visit (INDEPENDENT_AMBULATORY_CARE_PROVIDER_SITE_OTHER): Payer: Self-pay | Admitting: Family Medicine

## 2021-04-14 ENCOUNTER — Encounter: Payer: Self-pay | Admitting: Family Medicine

## 2021-04-14 DIAGNOSIS — J988 Other specified respiratory disorders: Secondary | ICD-10-CM

## 2021-04-14 DIAGNOSIS — U071 COVID-19: Secondary | ICD-10-CM

## 2021-04-14 MED ORDER — GUAIFENESIN-CODEINE 100-10 MG/5ML PO SYRP: 5.0000 mL | ORAL_SOLUTION | Freq: Two times a day (BID) | ORAL | 0 refills | Status: DC | PRN

## 2021-04-14 MED ORDER — AMOXICILLIN-POT CLAVULANATE 875-125 MG PO TABS: 1.0000 | ORAL_TABLET | Freq: Two times a day (BID) | ORAL | 0 refills | Status: AC

## 2021-04-14 NOTE — Progress Notes (Signed)
   Virtual Visit  Note Due to COVID-19 pandemic this visit was conducted virtually. This visit type was conducted due to national recommendations for restrictions regarding the COVID-19 Pandemic (e.g. social distancing, sheltering in place) in an effort to limit this patient's exposure and mitigate transmission in our community. All issues noted in this document were discussed and addressed.  A physical exam was not performed with this format.  I connected with Thomas Ball on 04/14/21 at [phone] by telephone and verified that I am speaking with the correct person using two identifiers. Thomas Ball is currently located in his car and no one is currently with him during the visit. The provider, Gwenlyn Perking, FNP is located in their office at time of visit.  I discussed the limitations, risks, security and privacy concerns of performing an evaluation and management service by telephone and the availability of in person appointments. I also discussed with the patient that there may be a patient responsible charge related to this service. The patient expressed understanding and agreed to proceed.  CC: cough  History and Present Illness:  HPI Thomas Ball reports a productive cough x 1 day. He has had a dry cough and congestion for 6 days. He tested positive for Covid 3 days ago. He was started on molnupiravir with the positive test. He reports that his has had a speckle of blood in his sputum since yesterday. Reports that congestion is improving. He reports shortness of breath at baseline, but that has worsened a little since having covid. Denies fever or chest pain. He is unsure any bleeding from nasal mucus. He has been using mucinex. He has been taking tessalon perles without improvement. He is a smoker.     ROS As per HPI.   Observations/Objective: Alert and oriented x 3. Able to speak in full sentences without difficulty.   Assessment and Plan: Thomas Ball was seen today for cough.  Diagnoses  and all orders for this visit:  Respiratory infection COVID-19 virus detected Augmentin as below. Cough syrup as needed. Discussed nasal saline. Discussed return precautions and where to see emergency care.  -     amoxicillin-clavulanate (AUGMENTIN) 875-125 MG tablet; Take 1 tablet by mouth 2 (two) times daily for 10 days. -     guaiFENesin-codeine (ROBITUSSIN AC) 100-10 MG/5ML syrup; Take 5 mLs by mouth 2 (two) times daily as needed for cough.  Follow Up Instructions: Return to office for new or worsening symptoms, or if symptoms persist.     I discussed the assessment and treatment plan with the patient. The patient was provided an opportunity to ask questions and all were answered. The patient agreed with the plan and demonstrated an understanding of the instructions.   The patient was advised to call back or seek an in-person evaluation if the symptoms worsen or if the condition fails to improve as anticipated.  The above assessment and management plan was discussed with the patient. The patient verbalized understanding of and has agreed to the management plan. Patient is aware to call the clinic if symptoms persist or worsen. Patient is aware when to return to the clinic for a follow-up visit. Patient educated on when it is appropriate to go to the emergency department.   Time call ended:  0823  I provided 12 minutes of  non face-to-face time during this encounter.    Gwenlyn Perking, FNP

## 2021-04-19 ENCOUNTER — Other Ambulatory Visit: Payer: Self-pay | Admitting: Family Medicine

## 2021-04-19 DIAGNOSIS — E7800 Pure hypercholesterolemia, unspecified: Secondary | ICD-10-CM

## 2021-04-28 NOTE — Patient Instructions (Signed)
Thomas Ball  04/28/2021     '@PREFPERIOPPHARMACY'$ @   Your procedure is scheduled on 05/09/2021.   Report to [name] at  [phone]  A.M.   Call this number if you have problems the morning of surgery:  [phone]   Remember:  Follow the diet and prep instructions given to you by the office.    Take these medicines the morning of surgery with A SIP OF WATER                      cardura, hydroxyzine, prilosec.     Do not wear jewelry, make-up or nail polish.  Do not wear lotions, powders, or perfumes, or deodorant.  Do not shave 48 hours prior to surgery.  Men may shave face and neck.  Do not bring valuables to the hospital.  [facility] is not responsible for any belongings or valuables.  Contacts, dentures or bridgework may not be worn into surgery.  Leave your suitcase in the car.  After surgery it may be brought to your room.  For patients admitted to the hospital, discharge time will be determined by your treatment team.  Patients discharged the day of surgery will not be allowed to drive home and must have someone with them for 24 hours.    Special instructions:   DO NOT smoke tobacco or vape for 24 hours.  Please read over the following fact sheets that you were given. Anesthesia Post-op Instructions and Care and Recovery After Surgery      Colonoscopy, Adult, Care After This sheet gives you information about how to care for yourself after your procedure. Your health care provider may also give you more specific instructions. If you have problems or questions, contact your health careprovider. What can I expect after the procedure? After the procedure, it is common to have: A small amount of blood in your stool for 24 hours after the procedure. Some gas. Mild cramping or bloating of your abdomen. Follow these instructions at home: Eating and drinking  Drink enough fluid to keep your urine pale yellow. Follow instructions from your health care  provider about eating or drinking restrictions. Resume your normal diet as instructed by your health care provider. Avoid heavy or fried foods that are hard to digest.  Activity Rest as told by your health care provider. Avoid sitting for a long time without moving. Get up to take short walks every 1-2 hours. This is important to improve blood flow and breathing. Ask for help if you feel weak or unsteady. Return to your normal activities as told by your health care provider. Ask your health care provider what activities are safe for you. Managing cramping and bloating  Try walking around when you have cramps or feel bloated. Apply heat to your abdomen as told by your health care provider. Use the heat source that your health care provider recommends, such as a moist heat pack or a heating pad. Place a towel between your skin and the heat source. Leave the heat on for 20-30 minutes. Remove the heat if your skin turns bright red. This is especially important if you are unable to feel pain, heat, or cold. You may have a greater risk of getting burned.  General instructions If you were given a sedative during the procedure, it can affect you for several hours. Do not drive or operate machinery until your health care provider says that it is safe. For  the first 24 hours after the procedure: Do not sign important documents. Do not drink alcohol. Do your regular daily activities at a slower pace than normal. Eat soft foods that are easy to digest. Take over-the-counter and prescription medicines only as told by your health care provider. Keep all follow-up visits as told by your health care provider. This is important. Contact a health care provider if: You have blood in your stool 2-3 days after the procedure. Get help right away if you have: More than a small spotting of blood in your stool. Large blood clots in your stool. Swelling of your abdomen. Nausea or vomiting. A fever. Increasing  pain in your abdomen that is not relieved with medicine. Summary After the procedure, it is common to have a small amount of blood in your stool. You may also have mild cramping and bloating of your abdomen. If you were given a sedative during the procedure, it can affect you for several hours. Do not drive or operate machinery until your health care provider says that it is safe. Get help right away if you have a lot of blood in your stool, nausea or vomiting, a fever, or increased pain in your abdomen. This information is not intended to replace advice given to you by your health care provider. Make sure you discuss any questions you have with your healthcare provider. Document Revised: 09/05/2019 Document Reviewed: 04/07/2019 Elsevier Patient Education  [place] After This sheet gives you information about how to care for yourself after your procedure. Your health care provider may also give you more specific instructions. If you have problems or questions, contact your health careprovider. What can I expect after the procedure? After the procedure, it is common to have: Tiredness. Forgetfulness about what happened after the procedure. Impaired judgment for important decisions. Nausea or vomiting. Some difficulty with balance. Follow these instructions at home: For the time period you were told by your health care provider:     Rest as needed. Do not participate in activities where you could fall or become injured. Do not drive or use machinery. Do not drink alcohol. Do not take sleeping pills or medicines that cause drowsiness. Do not make important decisions or sign legal documents. Do not take care of children on your own. Eating and drinking Follow the diet that is recommended by your health care provider. Drink enough fluid to keep your urine pale yellow. If you vomit: Drink water, juice, or soup when you can drink without  vomiting. Make sure you have little or no nausea before eating solid foods. General instructions Have a responsible adult stay with you for the time you are told. It is important to have someone help care for you until you are awake and alert. Take over-the-counter and prescription medicines only as told by your health care provider. If you have sleep apnea, surgery and certain medicines can increase your risk for breathing problems. Follow instructions from your health care provider about wearing your sleep device: Anytime you are sleeping, including during daytime naps. While taking prescription pain medicines, sleeping medicines, or medicines that make you drowsy. Avoid smoking. Keep all follow-up visits as told by your health care provider. This is important. Contact a health care provider if: You keep feeling nauseous or you keep vomiting. You feel light-headed. You are still sleepy or having trouble with balance after 24 hours. You develop a rash. You have a fever. You have redness or swelling around  the IV site. Get help right away if: You have trouble breathing. You have new-onset confusion at home. Summary For several hours after your procedure, you may feel tired. You may also be forgetful and have poor judgment. Have a responsible adult stay with you for the time you are told. It is important to have someone help care for you until you are awake and alert. Rest as told. Do not drive or operate machinery. Do not drink alcohol or take sleeping pills. Get help right away if you have trouble breathing, or if you suddenly become confused. This information is not intended to replace advice given to you by your health care provider. Make sure you discuss any questions you have with your healthcare provider. Document Revised: 05/27/2020 Document Reviewed: 08/14/2019 Elsevier Patient Education  2022 [employer].

## 2021-04-29 ENCOUNTER — Ambulatory Visit (INDEPENDENT_AMBULATORY_CARE_PROVIDER_SITE_OTHER): Payer: Self-pay | Admitting: Family Medicine

## 2021-04-29 ENCOUNTER — Encounter: Payer: Self-pay | Admitting: Family Medicine

## 2021-04-29 ENCOUNTER — Other Ambulatory Visit: Payer: Self-pay

## 2021-04-29 VITALS — BP 107/71 | HR 62 | Temp 97.7°F | Ht 65.0 in | Wt 289.2 lb

## 2021-04-29 DIAGNOSIS — M7989 Other specified soft tissue disorders: Secondary | ICD-10-CM

## 2021-04-29 DIAGNOSIS — R0981 Nasal congestion: Secondary | ICD-10-CM

## 2021-04-29 DIAGNOSIS — E1165 Type 2 diabetes mellitus with hyperglycemia: Secondary | ICD-10-CM | POA: Insufficient documentation

## 2021-04-29 DIAGNOSIS — G4733 Obstructive sleep apnea (adult) (pediatric): Secondary | ICD-10-CM

## 2021-04-29 DIAGNOSIS — E1169 Type 2 diabetes mellitus with other specified complication: Secondary | ICD-10-CM

## 2021-04-29 DIAGNOSIS — E785 Hyperlipidemia, unspecified: Secondary | ICD-10-CM

## 2021-04-29 LAB — BAYER DCA HB A1C WAIVED: HB A1C (BAYER DCA - WAIVED): 6.3 % (ref <7.0)

## 2021-04-29 MED ORDER — FLUTICASONE PROPIONATE 50 MCG/ACT NA SUSP: 2.0000 | Freq: Every day | NASAL | 6 refills | Status: DC

## 2021-04-29 NOTE — Progress Notes (Signed)
Subjective: CC: DM PCP: [name], DO [id number] Thomas Ball is a 60 y.o. male presenting to clinic today for:  1. Type 2 Diabetes with hyperlipidemia:  Reports that he monitors his blood sugar typically is running 90s to low 100s.  When he sees it in the 90s he goes and gets ice cream as a treat.  Overall though he really has tried to cut back and change his diet.  He is increased water, replaced Pepsi with Coke 0 and reduce the amount of bread and Posta that he is eating.  He has lost a little bit of weight as a result and has been working part-time now.  Trying to get a little bit more physically active and continue with weight loss.  He has not been able to be compliant with his CPAP because it comes off due to the straps coughing.  He has not reached out to his pulmonologist about this but wonders if he should be on supplemental oxygen at nighttime.  He was using his wife's oxygen, who is now passed, and he felt a little bit better using that.  Last eye exam: Up-to-date Last foot exam: Up-to-date Last A1c:  Lab Results  Component Value Date   HGBA1C 8.3 (H) 08/16/2020   Nephropathy screen indicated?:  Up-to-date Last flu, zoster and/or pneumovax:  Immunization History  Administered Date(s) Administered   Influenza Split 11/22/2012   Influenza,inj,Quad PF,6+ Mos 07/02/2015, 09/09/2018   Pneumococcal Polysaccharide-23 11/22/2012   Tdap 06/11/2012    ROS: No chest pain, blurred vision.  2.  Soft tissue mass Patient thinks that he has a lipoma or cyst on his left hip and wants to know if he can be referred to dermatology to have this removed.  Has had several cysts in the past that have required removal.  He is willing to pay out-of-pocket for this as he is bothered by it.   ROS: Per HPI  No Known Allergies Past Medical History:  Diagnosis Date   GERD (gastroesophageal reflux disease)    HCV (hepatitis C virus) DR. ZACKS   AUG 2013 VIRAL LOAD UNDETECTABLE    History of cardiac catheterization    a. 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   SVT (supraventricular tachycardia) (HCC)     Current Outpatient Medications:    albuterol (PROVENTIL) (2.5 MG/3ML) 0.083% nebulizer solution, Take 3 mLs (2.5 mg total) by nebulization every 6 (six) hours as needed for wheezing or shortness of breath., Disp: 150 mL, Rfl: 0   albuterol (VENTOLIN HFA) 108 (90 Base) MCG/ACT inhaler, Inhale 2 puffs into the lungs every 6 (six) hours as needed for wheezing or shortness of breath., Disp: 8 g, Rfl: 0   aspirin EC 81 MG tablet, Take 1 tablet (81 mg total) by mouth daily., Disp: , Rfl:    aspirin-acetaminophen-caffeine (EXCEDRIN MIGRAINE) 250-250-65 MG tablet, Take 1 tablet by mouth every 6 (six) hours as needed for headache., Disp: 30 tablet, Rfl: 0   atorvastatin (LIPITOR) 40 MG tablet, TAKE 1 TABLET DAILY AT 6PM, Disp: 90 tablet, Rfl: 0   benzonatate (TESSALON) 200 MG capsule, Take 1 capsule (200 mg total) by mouth 3 (three) times daily as needed., Disp: 30 capsule, Rfl: 1   budesonide-formoterol (SYMBICORT) 80-4.5 MCG/ACT inhaler, Inhale 2 puffs into the lungs 2 (two) times daily., Disp: 1  each, Rfl: 3   doxazosin (CARDURA) 1 MG tablet, Take 1 tablet (1 mg total) by mouth daily., Disp: 90 tablet, Rfl: 1   fluticasone (FLONASE) 50 MCG/ACT nasal spray, Place 2 sprays into both nostrils daily., Disp: 16 g, Rfl: 6   guaiFENesin-codeine (ROBITUSSIN AC) 100-10 MG/5ML syrup, Take 5 mLs by mouth 2 (two) times daily as needed for cough., Disp: 50 mL, Rfl: 0   hydrOXYzine (ATARAX/VISTARIL) 10 MG tablet, Take 1 tablet (10 mg total) by mouth 3 (three) times daily as needed., Disp: 30 tablet, Rfl: 0   metFORMIN (GLUCOPHAGE) 500 MG tablet, Take 1 tablet (500 mg total) by mouth 2 (two) times daily with a meal., Disp: 180 tablet, Rfl: 3   nystatin  (MYCOSTATIN/NYSTOP) powder, Apply 1 application topically 3 (three) times daily. x14 days per flare, Disp: 45 g, Rfl: 3   omeprazole (PRILOSEC) 40 MG capsule, TAKE ONE (1) CAPSULE EACH DAY, Disp: 90 capsule, Rfl: 0   polyethylene glycol-electrolytes (NULYTELY) 420 g solution, As directed, Disp: 4000 mL, Rfl: 0 Social History   Socioeconomic History   Marital status: Widowed    Spouse name: Not on file   Number of children: Not on file   Years of education: Not on file   Highest education level: Not on file  Occupational History   Occupation: concrete work    Fish farm manager: SELF-EMPLOYED  Tobacco Use   Smoking status: Every Day    Packs/day: 1.00    Years: 35.00    Pack years: 35.00    Types: Cigarettes   Smokeless tobacco: Never   Tobacco comments:    smokes 1-1.5 packs per day  Vaping Use   Vaping Use: Never used  Substance and Sexual Activity   Alcohol use: No   Drug use: No    Comment: in the 1980s, smoke crack. None since Dec 2011.    Sexual activity: Not on file  Other Topics Concern   Not on file  Social History Narrative   Not on file   Social Determinants of Health   Financial Resource Strain: Not on file  Food Insecurity: Not on file  Transportation Needs: Not on file  Physical Activity: Not on file  Stress: Not on file  Social Connections: Not on file  Intimate Partner Violence: Not on file   Family History  Problem Relation Age of Onset   Emphysema Mother    COPD Mother    Early death Mother    Hyperlipidemia Father    Stroke Father    Diabetes Father    High Cholesterol Father    Colon cancer Neg Hx     Objective: Office vital signs reviewed. BP 107/71   Pulse 62   Temp 97.7 F (36.5 C)   Ht '5\' 5"'$  (1.651 m)   Wt 289 lb 3.2 oz (131.2 kg)   SpO2 92%   BMI 48.13 kg/m   Physical Examination:  General: Awake, alert, morbidly obese, No acute distress HEENT: Normal sclera white.  No drainage from the nose Cardio: regular rate and rhythm, S1S2  heard, no murmurs appreciated Pulm: Mild expiratory wheezing noted.  Normal work of breathing on room air Skin: Fatty, irregularly shaped soft tissue mass noted along the left anterior thigh  Assessment/ Plan: 60 y.o. male   Controlled type 2 diabetes mellitus with other specified complication, without long-term current use of insulin ([place]) - Plan: Bayer DCA Hb A1c Waived  Hyperlipidemia associated with type 2 diabetes mellitus ([place])  Morbid obesity ([place])  OSA (obstructive sleep apnea)  Nasal congestion - Plan: fluticasone (FLONASE) 50 MCG/ACT nasal spray  Soft tissue mass - Plan: Ambulatory referral to Dermatology  Sugar now controlled with A1c down to 6.3.  Continue current regimen. I think that he would be a good candidate for [facility] but since he is not insured I would find this to be extremely financially taxing on him.  Should he regain insurance, we should strongly consider this as I think it would help with weight loss goals  Not yet due for fasting lipid.  Continue statin  Continue lifestyle modification for ongoing weight loss.  Also having issues with his CPAP fit.  Will CC his pulmonologist but I did advise him he may need to go back to the place he purchased the CPAP machine from for refit.   Renewal of Flonase for nasal congestion which was noted today after discussing his CPAP  Unsure what the soft tissue mass is.  I favor lipoma.  It does not appear to be cystic.  Referral to dermatology placed in [place] as per his request  No orders of the defined types were placed in this encounter.  No orders of the defined types were placed in this encounter.    [name], DO [place] [phone]

## 2021-05-04 ENCOUNTER — Ambulatory Visit (INDEPENDENT_AMBULATORY_CARE_PROVIDER_SITE_OTHER): Payer: Self-pay | Admitting: Family Medicine

## 2021-05-04 ENCOUNTER — Encounter: Payer: Self-pay | Admitting: Family Medicine

## 2021-05-04 ENCOUNTER — Other Ambulatory Visit: Payer: Self-pay

## 2021-05-04 VITALS — BP 104/71 | HR 61 | Temp 97.6°F | Ht 65.0 in | Wt 291.2 lb

## 2021-05-04 DIAGNOSIS — R202 Paresthesia of skin: Secondary | ICD-10-CM

## 2021-05-04 DIAGNOSIS — M79604 Pain in right leg: Secondary | ICD-10-CM

## 2021-05-04 MED ORDER — KETOROLAC TROMETHAMINE 30 MG/ML IJ SOLN
30.0000 mg | Freq: Once | INTRAMUSCULAR | Status: AC
  Administered 2021-05-04: 30 mg via INTRAMUSCULAR

## 2021-05-04 NOTE — Progress Notes (Signed)
Assessment & Plan:  1. Right leg pain Toradol IM in office for pain relief. - ketorolac (TORADOL) 30 MG/ML injection 30 mg  2. Tingling in extremities Advised to keep a log of when he has this sensation, what he has done, and what he has eaten. Patient to bring log with him to his next appointment with his PCP.    Follow up plan: Return if symptoms worsen or fail to improve.  Hendricks Limes, MSN, APRN, FNP-C [facility]  Subjective:   Patient ID: Thomas Ball, male    DOB: [birth year], 60 y.o.   MRN: [id number]  HPI: Thomas Ball is a 60 y.o. male presenting on 05/04/2021 for Leg Pain (Patient states he has been having right calf pain and tingling in right foot that started yesterday. )  Patient reports right calf pain that he describes as an irritation that started yesterday. Denies any change in activity or injury. His right foot is tingling. He states it is normally only his toes that tingle. The tingling has been going on for a couple of months now intermittently and usually occurs when he doesn't eat right by his diabetes (when he treats himself to ice cream).    ROS: Negative unless specifically indicated above in HPI.   Relevant past medical history reviewed and updated as indicated.   Allergies and medications reviewed and updated.   Current Outpatient Medications:    albuterol (PROVENTIL) (2.5 MG/3ML) 0.083% nebulizer solution, Take 3 mLs (2.5 mg total) by nebulization every 6 (six) hours as needed for wheezing or shortness of breath., Disp: 150 mL, Rfl: 0   albuterol (VENTOLIN HFA) 108 (90 Base) MCG/ACT inhaler, Inhale 2 puffs into the lungs every 6 (six) hours as needed for wheezing or shortness of breath., Disp: 8 g, Rfl: 0   aspirin EC 81 MG tablet, Take 1 tablet (81 mg total) by mouth daily., Disp: , Rfl:    aspirin-acetaminophen-caffeine (EXCEDRIN MIGRAINE) 250-250-65 MG tablet, Take 1 tablet by mouth every 6 (six) hours as needed for  headache., Disp: 30 tablet, Rfl: 0   atorvastatin (LIPITOR) 40 MG tablet, TAKE 1 TABLET DAILY AT 6PM, Disp: 90 tablet, Rfl: 0   budesonide-formoterol (SYMBICORT) 80-4.5 MCG/ACT inhaler, Inhale 2 puffs into the lungs 2 (two) times daily., Disp: 1 each, Rfl: 3   doxazosin (CARDURA) 1 MG tablet, Take 1 tablet (1 mg total) by mouth daily., Disp: 90 tablet, Rfl: 1   fluticasone (FLONASE) 50 MCG/ACT nasal spray, Place 2 sprays into both nostrils daily., Disp: 16 g, Rfl: 6   hydrOXYzine (ATARAX/VISTARIL) 10 MG tablet, Take 1 tablet (10 mg total) by mouth 3 (three) times daily as needed., Disp: 30 tablet, Rfl: 0   metFORMIN (GLUCOPHAGE) 500 MG tablet, Take 1 tablet (500 mg total) by mouth 2 (two) times daily with a meal., Disp: 180 tablet, Rfl: 3   nystatin (MYCOSTATIN/NYSTOP) powder, Apply 1 application topically 3 (three) times daily. x14 days per flare, Disp: 45 g, Rfl: 3   omeprazole (PRILOSEC) 40 MG capsule, TAKE ONE (1) CAPSULE EACH DAY, Disp: 90 capsule, Rfl: 0   polyethylene glycol-electrolytes (NULYTELY) 420 g solution, As directed, Disp: 4000 mL, Rfl: 0  No Known Allergies  Objective:   BP 104/71   Pulse 61   Temp 97.6 F (36.4 C) (Temporal)   Ht '5\' 5"'$  (1.651 m)   Wt 291 lb 3.2 oz (132.1 kg)   SpO2 94%   BMI 48.46 kg/m    Physical Exam  Vitals reviewed.  Constitutional:      General: He is not in acute distress.    Appearance: Normal appearance. He is not ill-appearing, toxic-appearing or diaphoretic.  HENT:     Head: Normocephalic and atraumatic.  Eyes:     General: No scleral icterus.       Right eye: No discharge.        Left eye: No discharge.     Conjunctiva/sclera: Conjunctivae normal.  Cardiovascular:     Rate and Rhythm: Normal rate.  Pulmonary:     Effort: Pulmonary effort is normal. No respiratory distress.  Musculoskeletal:        General: Normal range of motion.     Cervical back: Normal range of motion.     Right lower leg: Normal.     Right foot: Normal.   Skin:    General: Skin is warm and dry.  Neurological:     Mental Status: He is alert and oriented to person, place, and time. Mental status is at baseline.  Psychiatric:        Mood and Affect: Mood normal.        Behavior: Behavior normal.        Thought Content: Thought content normal.        Judgment: Judgment normal.

## 2021-05-05 ENCOUNTER — Encounter (HOSPITAL_COMMUNITY)
Admission: RE | Admit: 2021-05-05 | Discharge: 2021-05-05 | Disposition: A | Discharge status: Home / Self Care | Payer: Self-pay | Source: Ambulatory Visit | Attending: Internal Medicine | Admitting: Internal Medicine

## 2021-05-05 ENCOUNTER — Other Ambulatory Visit: Payer: Self-pay

## 2021-05-05 ENCOUNTER — Encounter (HOSPITAL_COMMUNITY): Payer: Self-pay

## 2021-05-05 NOTE — Pre-Procedure Instructions (Signed)
In for PAT and patient states, "I need you to tell the guy that's putting me to sleep that I take suboxone. How doe you get off of that stuff". I told him to contact the ordering provider and they could make a plan for him to do that. He states, " I don't have anyone who orders it for me. I get it off the street. I am not proud of that but I was trying to get off opiods a few years ago and it works, but I cannot afford it so I get it off the street." I spoke with Dr Rick Duff, anesthesiologist and he states to tell the patient that he needs an ordering provider, but not to try and come off suboxone on his own. I did this and patient voiced understanding of importance of this. I will forward this note to Dr Abbey Chatters so he is aware. Dr Rick Duff feels it is okay to proceed if Dr Abbey Chatters is comfortable doing so.

## 2021-05-06 ENCOUNTER — Telehealth: Payer: Self-pay | Admitting: *Deleted

## 2021-05-06 NOTE — Telephone Encounter (Signed)
Patient called in. He is scheduled for procedure on Monday with Dr. Abbey Chatters and needs to r/s. Patient now scheduled for 9/12 at 9:45am. Aware will mail new instructions with pre-op appt.

## 2021-06-01 NOTE — Patient Instructions (Addendum)
Thomas Ball  06/01/2021     '@PREFPERIOPPHARMACY'$ @   Your procedure is scheduled on  06/06/2021.   Report to [name] at  [facility]   Call this number if you have problems the morning of surgery:  [phone]   Remember:  Follow the diet and prep instructions given to you by the office.    Take these medicines the morning of surgery with A SIP OF WATER                               cardura, prilosec.     Do not wear jewelry, make-up or nail polish.  Do not wear lotions, powders, or perfumes, or deodorant.  Do not shave 48 hours prior to surgery.  Men may shave face and neck.  Do not bring valuables to the hospital.  [facility] is not responsible for any belongings or valuables.  Contacts, dentures or bridgework may not be worn into surgery.  Leave your suitcase in the car.  After surgery it may be brought to your room.  For patients admitted to the hospital, discharge time will be determined by your treatment team.  Patients discharged the day of surgery will not be allowed to drive home and must have someone with them for 24 hours.    Special instructions:   DO NOT smoke tobacco or vape for 24 hours before your procedure.  Please read over the following fact sheets that you were given. Anesthesia Post-op Instructions and Care and Recovery After Surgery      Upper Endoscopy, Adult, Care After This sheet gives you information about how to care for yourself after your procedure. Your health care provider may also give you more specific instructions. If you have problems or questions, contact your health care provider. What can I expect after the procedure? After the procedure, it is common to have: A sore throat. Mild stomach pain or discomfort. Bloating. Nausea. Follow these instructions at home:  Follow instructions from your health care provider about what to eat or drink after your procedure. Return to your normal activities as told by your  health care provider. Ask your health care provider what activities are safe for you. Take over-the-counter and prescription medicines only as told by your health care provider. If you were given a sedative during the procedure, it can affect you for several hours. Do not drive or operate machinery until your health care provider says that it is safe. Keep all follow-up visits as told by your health care provider. This is important. Contact a health care provider if you have: A sore throat that lasts longer than one day. Trouble swallowing. Get help right away if: You vomit blood or your vomit looks like coffee grounds. You have: A fever. Bloody, black, or tarry stools. A severe sore throat or you cannot swallow. Difficulty breathing. Severe pain in your chest or abdomen. Summary After the procedure, it is common to have a sore throat, mild stomach discomfort, bloating, and nausea. If you were given a sedative during the procedure, it can affect you for several hours. Do not drive or operate machinery until your health care provider says that it is safe. Follow instructions from your health care provider about what to eat or drink after your procedure. Return to your normal activities as told by your health care provider. This information is not intended to replace  advice given to you by your health care provider. Make sure you discuss any questions you have with your health care provider. Document Revised: 09/09/2019 Document Reviewed: 02/11/2018 Elsevier Patient Education  2022 [place]. Colonoscopy, Adult, Care After This sheet gives you information about how to care for yourself after your procedure. Your health care provider may also give you more specific instructions. If you have problems or questions, contact your health care provider. What can I expect after the procedure? After the procedure, it is common to have: A small amount of blood in your stool for 24 hours after the  procedure. Some gas. Mild cramping or bloating of your abdomen. Follow these instructions at home: Eating and drinking  Drink enough fluid to keep your urine pale yellow. Follow instructions from your health care provider about eating or drinking restrictions. Resume your normal diet as instructed by your health care provider. Avoid heavy or fried foods that are hard to digest. Activity Rest as told by your health care provider. Avoid sitting for a long time without moving. Get up to take short walks every 1-2 hours. This is important to improve blood flow and breathing. Ask for help if you feel weak or unsteady. Return to your normal activities as told by your health care provider. Ask your health care provider what activities are safe for you. Managing cramping and bloating  Try walking around when you have cramps or feel bloated. Apply heat to your abdomen as told by your health care provider. Use the heat source that your health care provider recommends, such as a moist heat pack or a heating pad. Place a towel between your skin and the heat source. Leave the heat on for 20-30 minutes. Remove the heat if your skin turns bright red. This is especially important if you are unable to feel pain, heat, or cold. You may have a greater risk of getting burned. General instructions If you were given a sedative during the procedure, it can affect you for several hours. Do not drive or operate machinery until your health care provider says that it is safe. For the first 24 hours after the procedure: Do not sign important documents. Do not drink alcohol. Do your regular daily activities at a slower pace than normal. Eat soft foods that are easy to digest. Take over-the-counter and prescription medicines only as told by your health care provider. Keep all follow-up visits as told by your health care provider. This is important. Contact a health care provider if: You have blood in your stool 2-3  days after the procedure. Get help right away if you have: More than a small spotting of blood in your stool. Large blood clots in your stool. Swelling of your abdomen. Nausea or vomiting. A fever. Increasing pain in your abdomen that is not relieved with medicine. Summary After the procedure, it is common to have a small amount of blood in your stool. You may also have mild cramping and bloating of your abdomen. If you were given a sedative during the procedure, it can affect you for several hours. Do not drive or operate machinery until your health care provider says that it is safe. Get help right away if you have a lot of blood in your stool, nausea or vomiting, a fever, or increased pain in your abdomen. This information is not intended to replace advice given to you by your health care provider. Make sure you discuss any questions you have with your health care provider.  Document Revised: 09/05/2019 Document Reviewed: 04/07/2019 Elsevier Patient Education  [place] After This sheet gives you information about how to care for yourself after your procedure. Your health care provider may also give you more specific instructions. If you have problems or questions, contact your health care provider. What can I expect after the procedure? After the procedure, it is common to have: Tiredness. Forgetfulness about what happened after the procedure. Impaired judgment for important decisions. Nausea or vomiting. Some difficulty with balance. Follow these instructions at home: For the time period you were told by your health care provider:   Rest as needed. Do not participate in activities where you could fall or become injured. Do not drive or use machinery. Do not drink alcohol. Do not take sleeping pills or medicines that cause drowsiness. Do not make important decisions or sign legal documents. Do not take care of children on your own. Eating  and drinking Follow the diet that is recommended by your health care provider. Drink enough fluid to keep your urine pale yellow. If you vomit: Drink water, juice, or soup when you can drink without vomiting. Make sure you have little or no nausea before eating solid foods. General instructions Have a responsible adult stay with you for the time you are told. It is important to have someone help care for you until you are awake and alert. Take over-the-counter and prescription medicines only as told by your health care provider. If you have sleep apnea, surgery and certain medicines can increase your risk for breathing problems. Follow instructions from your health care provider about wearing your sleep device: Anytime you are sleeping, including during daytime naps. While taking prescription pain medicines, sleeping medicines, or medicines that make you drowsy. Avoid smoking. Keep all follow-up visits as told by your health care provider. This is important. Contact a health care provider if: You keep feeling nauseous or you keep vomiting. You feel light-headed. You are still sleepy or having trouble with balance after 24 hours. You develop a rash. You have a fever. You have redness or swelling around the IV site. Get help right away if: You have trouble breathing. You have new-onset confusion at home. Summary For several hours after your procedure, you may feel tired. You may also be forgetful and have poor judgment. Have a responsible adult stay with you for the time you are told. It is important to have someone help care for you until you are awake and alert. Rest as told. Do not drive or operate machinery. Do not drink alcohol or take sleeping pills. Get help right away if you have trouble breathing, or if you suddenly become confused. This information is not intended to replace advice given to you by your health care provider. Make sure you discuss any questions you have with your  health care provider. Document Revised: 05/27/2020 Document Reviewed: 08/14/2019 Elsevier Patient Education  2022 [employer].

## 2021-06-03 ENCOUNTER — Other Ambulatory Visit: Payer: Self-pay

## 2021-06-03 ENCOUNTER — Encounter (HOSPITAL_COMMUNITY)
Admission: RE | Admit: 2021-06-03 | Discharge: 2021-06-03 | Disposition: A | Discharge status: Home / Self Care | Payer: Self-pay | Source: Ambulatory Visit | Attending: Internal Medicine | Admitting: Internal Medicine

## 2021-06-03 ENCOUNTER — Encounter (HOSPITAL_COMMUNITY): Payer: Self-pay

## 2021-06-03 HISTORY — DX: Sleep apnea, unspecified: G47.30

## 2021-06-06 ENCOUNTER — Telehealth: Payer: Self-pay | Admitting: Internal Medicine

## 2021-06-06 NOTE — OR Nursing (Signed)
Patient came this morning and said that his prep did not clean him out. He is scheduled to have egd and tcs, but wanted to reschedule due to the fact the he was not cleanned out.  He is calling the office to reschedule and to see if he needs to do anything different in regards to his prep.  Dr. Abbey Chatters notified

## 2021-06-06 NOTE — Telephone Encounter (Signed)
Tried to call pt, LMOVM for return call. 

## 2021-06-06 NOTE — Telephone Encounter (Signed)
Pt called to say that his ride didn't show up for him this morning for his procedure so he drove himself. He said he was still passing solid stool and wasn't cleaned out good and the hospital sent him home and told him to call us. [phone]

## 2021-06-07 NOTE — Telephone Encounter (Signed)
Letter mailed

## 2021-06-07 NOTE — Telephone Encounter (Signed)
Spoke to pt, he started prep at 12:00pm the day before TCS/EGD. Has 1st BM approx 4 hours after starting prep. Drank most of prep that day and finished remaining prep at 4:00am morning of procedure. Was still having formed stool morning of procedure. TCS/EGD rescheduled to 07/04/21 at 8:00am. Advised him I would ask Dr. Abbey Chatters about extended prep. Reminded pt he needs a driver with him or procedure will be cancelled. Stated he knew but wanted to show up. Endo scheduler informed procedure was rescheduled.  Dr. Abbey Chatters, would you like for him to do 2 days of clear liquids and 1 1/2 preps prior to next procedure?

## 2021-06-08 NOTE — Telephone Encounter (Signed)
Yes that sounds good.  Thank you

## 2021-06-09 MED ORDER — PEG 3350-KCL-NA BICARB-NACL 420 G PO SOLR: 4000.0000 mL | ORAL | 0 refills | Status: DC

## 2021-06-09 NOTE — Telephone Encounter (Signed)
Rx for 2 Tri-lytes sent to pharmacy. Instructions and pre-op letter mailed.  Tried to call pt, LMOVM to inform him Dr. Abbey Chatters is ok with extended prep as we had previously discussed and rx was sent.

## 2021-06-09 NOTE — Addendum Note (Signed)
Addended by: Hassan Rowan on: 06/09/2021 07:51 AM   Modules accepted: Orders

## 2021-06-14 ENCOUNTER — Ambulatory Visit (INDEPENDENT_AMBULATORY_CARE_PROVIDER_SITE_OTHER): Payer: Self-pay | Admitting: Family Medicine

## 2021-06-14 ENCOUNTER — Encounter: Payer: Self-pay | Admitting: Family Medicine

## 2021-06-14 DIAGNOSIS — R059 Cough, unspecified: Secondary | ICD-10-CM

## 2021-06-14 DIAGNOSIS — Z720 Tobacco use: Secondary | ICD-10-CM

## 2021-06-14 DIAGNOSIS — J988 Other specified respiratory disorders: Secondary | ICD-10-CM

## 2021-06-14 MED ORDER — PREDNISONE 10 MG (21) PO TBPK: ORAL_TABLET | ORAL | 0 refills | Status: DC

## 2021-06-14 MED ORDER — AZITHROMYCIN 250 MG PO TABS: ORAL_TABLET | ORAL | 0 refills | Status: DC

## 2021-06-14 MED ORDER — BENZONATATE 100 MG PO CAPS: 100.0000 mg | ORAL_CAPSULE | Freq: Three times a day (TID) | ORAL | 0 refills | Status: DC | PRN

## 2021-06-14 NOTE — Progress Notes (Signed)
Virtual Visit via Telephone Note  I connected with Thomas Ball on 06/14/21 at 10:57 PM by telephone and verified that I am speaking with the correct person using two identifiers. Thomas Ball is currently located at home and nobody is currently with him during this visit. The provider, Loman Brooklyn, FNP is located in their office at time of visit.  I discussed the limitations, risks, security and privacy concerns of performing an evaluation and management service by telephone and the availability of in person appointments. I also discussed with the patient that there may be a patient responsible charge related to this service. The patient expressed understanding and agreed to proceed.  Subjective: PCP: [name], DO  Chief Complaint  Patient presents with   Cough   Patient complains of a productive cough, head/chest congestion, sore throat, shortness of breath, and wheezing. Onset of symptoms was 1 day ago, unchanged since that time. He is drinking moderate amounts of fluids. Evaluation to date: none. Treatment to date: cough suppressants. He does smoke.    ROS: Per HPI  Current Outpatient Medications:    albuterol (PROVENTIL) (2.5 MG/3ML) 0.083% nebulizer solution, Take 3 mLs (2.5 mg total) by nebulization every 6 (six) hours as needed for wheezing or shortness of breath., Disp: 150 mL, Rfl: 0   albuterol (VENTOLIN HFA) 108 (90 Base) MCG/ACT inhaler, Inhale 2 puffs into the lungs every 6 (six) hours as needed for wheezing or shortness of breath., Disp: 8 g, Rfl: 0   aspirin EC 81 MG tablet, Take 1 tablet (81 mg total) by mouth daily., Disp: , Rfl:    aspirin-acetaminophen-caffeine (EXCEDRIN MIGRAINE) 250-250-65 MG tablet, Take 1 tablet by mouth every 6 (six) hours as needed for headache. (Patient not taking: Reported on 05/04/2021), Disp: 30 tablet, Rfl: 0   atorvastatin (LIPITOR) 40 MG tablet, TAKE 1 TABLET DAILY AT 6PM, Disp: 90 tablet, Rfl: 0   budesonide-formoterol  (SYMBICORT) 80-4.5 MCG/ACT inhaler, Inhale 2 puffs into the lungs 2 (two) times daily. (Patient taking differently: Inhale 2 puffs into the lungs daily as needed (Shortness of breath).), Disp: 1 each, Rfl: 3   doxazosin (CARDURA) 1 MG tablet, Take 1 tablet (1 mg total) by mouth daily., Disp: 90 tablet, Rfl: 1   fluticasone (FLONASE) 50 MCG/ACT nasal spray, Place 2 sprays into both nostrils daily., Disp: 16 g, Rfl: 6   hydrOXYzine (ATARAX/VISTARIL) 10 MG tablet, Take 1 tablet (10 mg total) by mouth 3 (three) times daily as needed. (Patient not taking: Reported on 05/04/2021), Disp: 30 tablet, Rfl: 0   metFORMIN (GLUCOPHAGE) 500 MG tablet, Take 1 tablet (500 mg total) by mouth 2 (two) times daily with a meal., Disp: 180 tablet, Rfl: 3   nystatin (MYCOSTATIN/NYSTOP) powder, Apply 1 application topically 3 (three) times daily. x14 days per flare (Patient taking differently: Apply 1 application topically daily as needed (rash).), Disp: 45 g, Rfl: 3   omeprazole (PRILOSEC) 40 MG capsule, TAKE ONE (1) CAPSULE EACH DAY, Disp: 90 capsule, Rfl: 0   polyethylene glycol-electrolytes (NULYTELY) 420 g solution, As directed, Disp: 4000 mL, Rfl: 0   polyethylene glycol-electrolytes (TRILYTE) 420 g solution, Take 4,000 mLs by mouth as directed., Disp: 8000 mL, Rfl: 0  No Known Allergies Past Medical History:  Diagnosis Date   GERD (gastroesophageal reflux disease)    HCV (hepatitis C virus) DR. ZACKS   AUG 2013 VIRAL LOAD UNDETECTABLE   History of cardiac catheterization    a. normal cors by cath in  09/2018 with mild pulmonary HTN   Hypercholesteremia    Hypertension    Pancreatitis, gallstone    Skin cancer 1992   Removed from side of head   Sleep apnea    SVT (supraventricular tachycardia) (HCC)     Observations/Objective: A&O  No respiratory distress or wheezing audible over the phone Mood, judgement, and thought processes all WNL  Assessment and Plan: 1. Respiratory infection I suspect patient  has undiagnosed COPD given his history and am therefore treating as such. He will come for influenza and COVID testing as well.  - azithromycin (ZITHROMAX Z-PAK) 250 MG tablet; Take 2 tablets (500 mg) PO today, then 1 tablet (250 mg) PO daily x4 days.  Dispense: 6 tablet; Refill: 0 - predniSONE (STERAPRED UNI-PAK 21 TAB) 10 MG (21) TBPK tablet; As directed x 6 days  Dispense: 21 tablet; Refill: 0  2. Cough - Novel Coronavirus, NAA ([facility]); Future - Veritor Flu A/B Waived; Future - benzonatate (TESSALON PERLES) 100 MG capsule; Take 1 capsule (100 mg total) by mouth 3 (three) times daily as needed for cough.  Dispense: 30 capsule; Refill: 0  3. Current tobacco use - azithromycin (ZITHROMAX Z-PAK) 250 MG tablet; Take 2 tablets (500 mg) PO today, then 1 tablet (250 mg) PO daily x4 days.  Dispense: 6 tablet; Refill: 0 - predniSONE (STERAPRED UNI-PAK 21 TAB) 10 MG (21) TBPK tablet; As directed x 6 days  Dispense: 21 tablet; Refill: 0   Follow Up Instructions:  I discussed the assessment and treatment plan with the patient. The patient was provided an opportunity to ask questions and all were answered. The patient agreed with the plan and demonstrated an understanding of the instructions.   The patient was advised to call back or seek an in-person evaluation if the symptoms worsen or if the condition fails to improve as anticipated.  The above assessment and management plan was discussed with the patient. The patient verbalized understanding of and has agreed to the management plan. Patient is aware to call the clinic if symptoms persist or worsen. Patient is aware when to return to the clinic for a follow-up visit. Patient educated on when it is appropriate to go to the emergency department.   Time call ended: 11:08 AM  I provided 11 minutes of non-face-to-face time during this encounter.  Hendricks Limes, MSN, APRN, FNP-C [facility] 06/14/21

## 2021-06-17 ENCOUNTER — Telehealth: Payer: Self-pay | Admitting: *Deleted

## 2021-06-17 DIAGNOSIS — R059 Cough, unspecified: Secondary | ICD-10-CM

## 2021-06-17 MED ORDER — PROMETHAZINE-DM 6.25-15 MG/5ML PO SYRP: 5.0000 mL | ORAL_SOLUTION | Freq: Four times a day (QID) | ORAL | 1 refills | Status: DC | PRN

## 2021-06-17 MED ORDER — BENZONATATE 100 MG PO CAPS: 100.0000 mg | ORAL_CAPSULE | Freq: Three times a day (TID) | ORAL | 0 refills | Status: DC | PRN

## 2021-06-17 NOTE — Telephone Encounter (Signed)
Please let the patient know that I sent in Promethazine DM it is a cough syrup

## 2021-06-17 NOTE — Telephone Encounter (Signed)
PT AWARE OF COUGH SYRUP

## 2021-06-17 NOTE — Telephone Encounter (Signed)
Patient called nurse line complaining of high blood sugar reading of 353.  He states he is constantly sucking on cough drops to help soothe his throat from excessive coughing.  He says that the prednisone has helped his breathing but the coughing is out of control.  He states he needs someone to send something into the pharmacy to help with his coughing.  [place] patient

## 2021-06-17 NOTE — Telephone Encounter (Signed)
I sent in [facility] for the patient, with his blood sugar being up its likely because of the prednisone have him take an extra metformin every morning that he is on the prednisone

## 2021-06-17 NOTE — Telephone Encounter (Signed)
PT IS REQUESTING COUGH SYRUP

## 2021-06-17 NOTE — Addendum Note (Signed)
Addended by: Caryl Pina on: 06/17/2021 03:30 PM   Modules accepted: Orders

## 2021-06-19 ENCOUNTER — Other Ambulatory Visit: Payer: Self-pay

## 2021-06-19 ENCOUNTER — Emergency Department (HOSPITAL_COMMUNITY): Payer: Self-pay

## 2021-06-19 ENCOUNTER — Encounter (HOSPITAL_COMMUNITY): Payer: Self-pay

## 2021-06-19 ENCOUNTER — Emergency Department (HOSPITAL_COMMUNITY)
Admission: EM | Admit: 2021-06-19 | Discharge: 2021-06-19 | Disposition: A | Discharge status: Home / Self Care | Payer: Self-pay | Attending: Emergency Medicine | Admitting: Emergency Medicine

## 2021-06-19 DIAGNOSIS — I10 Essential (primary) hypertension: Secondary | ICD-10-CM | POA: Insufficient documentation

## 2021-06-19 DIAGNOSIS — Z7984 Long term (current) use of oral hypoglycemic drugs: Secondary | ICD-10-CM | POA: Insufficient documentation

## 2021-06-19 DIAGNOSIS — J441 Chronic obstructive pulmonary disease with (acute) exacerbation: Secondary | ICD-10-CM | POA: Insufficient documentation

## 2021-06-19 DIAGNOSIS — Z7982 Long term (current) use of aspirin: Secondary | ICD-10-CM | POA: Insufficient documentation

## 2021-06-19 DIAGNOSIS — Z20822 Contact with and (suspected) exposure to covid-19: Secondary | ICD-10-CM | POA: Insufficient documentation

## 2021-06-19 DIAGNOSIS — F17210 Nicotine dependence, cigarettes, uncomplicated: Secondary | ICD-10-CM | POA: Insufficient documentation

## 2021-06-19 DIAGNOSIS — Z85828 Personal history of other malignant neoplasm of skin: Secondary | ICD-10-CM | POA: Insufficient documentation

## 2021-06-19 DIAGNOSIS — E1169 Type 2 diabetes mellitus with other specified complication: Secondary | ICD-10-CM | POA: Insufficient documentation

## 2021-06-19 LAB — CBC WITH DIFFERENTIAL/PLATELET
Abs Immature Granulocytes: 0.03 10*3/uL (ref 0.00–0.07)
Basophils Absolute: 0 10*3/uL (ref 0.0–0.1)
Basophils Relative: 1 %
Eosinophils Absolute: 0 10*3/uL (ref 0.0–0.5)
Eosinophils Relative: 0 %
HCT: 43.1 % (ref 39.0–52.0)
Hemoglobin: 14.7 g/dL (ref 13.0–17.0)
Immature Granulocytes: 0 %
Lymphocytes Relative: 24 %
Lymphs Abs: 1.8 10*3/uL (ref 0.7–4.0)
MCH: 33.2 pg (ref 26.0–34.0)
MCHC: 34.1 g/dL (ref 30.0–36.0)
MCV: 97.3 fL (ref 80.0–100.0)
Monocytes Absolute: 0.5 10*3/uL (ref 0.1–1.0)
Monocytes Relative: 7 %
Neutro Abs: 5.2 10*3/uL (ref 1.7–7.7)
Neutrophils Relative %: 68 %
Platelets: 133 10*3/uL — ABNORMAL LOW (ref 150–400)
RBC: 4.43 MIL/uL (ref 4.22–5.81)
RDW: 13.2 % (ref 11.5–15.5)
WBC: 7.7 10*3/uL (ref 4.0–10.5)
nRBC: 0 % (ref 0.0–0.2)

## 2021-06-19 LAB — COMPREHENSIVE METABOLIC PANEL
ALT: 31 U/L (ref 0–44)
AST: 22 U/L (ref 15–41)
Albumin: 4 g/dL (ref 3.5–5.0)
Alkaline Phosphatase: 21 U/L — ABNORMAL LOW (ref 38–126)
Anion gap: 7 (ref 5–15)
BUN: 20 mg/dL (ref 6–20)
CO2: 26 mmol/L (ref 22–32)
Calcium: 9 mg/dL (ref 8.9–10.3)
Chloride: 105 mmol/L (ref 98–111)
Creatinine, Ser: 0.8 mg/dL (ref 0.61–1.24)
GFR, Estimated: >60 mL/min (ref >60)
Glucose, Bld: 140 mg/dL — ABNORMAL HIGH (ref 70–99)
Potassium: 4.4 mmol/L (ref 3.5–5.1)
Sodium: 138 mmol/L (ref 135–145)
Total Bilirubin: 0.9 mg/dL (ref 0.3–1.2)
Total Protein: 6.6 g/dL (ref 6.5–8.1)

## 2021-06-19 LAB — RESP PANEL BY RT-PCR (FLU A&B, COVID) ARPGX2
Influenza A by PCR: NEGATIVE
Influenza B by PCR: NEGATIVE
SARS Coronavirus 2 by RT PCR: NEGATIVE

## 2021-06-19 MED ORDER — ALBUTEROL SULFATE HFA 108 (90 BASE) MCG/ACT IN AERS
2.0000 | INHALATION_SPRAY | Freq: Once | RESPIRATORY_TRACT | Status: AC
  Administered 2021-06-19: 2 via RESPIRATORY_TRACT
  Filled 2021-06-19: qty 6.7

## 2021-06-19 MED ORDER — DM-GUAIFENESIN ER 30-600 MG PO TB12
1.0000 | ORAL_TABLET | Freq: Two times a day (BID) | ORAL | 0 refills | Status: DC
Start: 2021-06-19 — End: 2021-06-28

## 2021-06-19 MED ORDER — PREDNISONE 10 MG PO TABS: 20.0000 mg | ORAL_TABLET | Freq: Every day | ORAL | 0 refills | Status: DC

## 2021-06-19 MED ORDER — ALBUTEROL SULFATE HFA 108 (90 BASE) MCG/ACT IN AERS: 2.0000 | INHALATION_SPRAY | Freq: Four times a day (QID) | RESPIRATORY_TRACT | 0 refills | Status: DC | PRN

## 2021-06-19 MED ORDER — GUAIFENESIN ER 600 MG PO TB12
600.0000 mg | ORAL_TABLET | Freq: Once | ORAL | Status: AC
  Administered 2021-06-19: 600 mg via ORAL
  Filled 2021-06-19: qty 1

## 2021-06-19 NOTE — ED Provider Notes (Signed)
[facility] EMERGENCY DEPARTMENT Provider Note   CSN: [id number] Arrival date & time: 06/19/21  1536     History Chief Complaint  Patient presents with   Shortness of Thomas Ball is a 60 y.o. male.  Patient complains of cough and shortness of breath.  He has history of COPD.  The history is provided by the patient and medical records. No language interpreter was used.  Shortness of Breath Severity:  Moderate Onset quality:  Unable to specify Timing:  Constant Progression:  Worsening Chronicity:  New Context: activity   Relieved by:  Nothing Worsened by:  Nothing Ineffective treatments:  None tried Associated symptoms: wheezing   Associated symptoms: no abdominal pain, no chest pain, no cough, no headaches and no rash   Risk factors: no recent alcohol use       Past Medical History:  Diagnosis Date   GERD (gastroesophageal reflux disease)    HCV (hepatitis C virus) DR. ZACKS   AUG 2013 VIRAL LOAD UNDETECTABLE   History of cardiac catheterization    a. 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   Sleep apnea    SVT (supraventricular tachycardia) [facility])     Patient Active Problem List   Diagnosis Date Noted   Hyperlipidemia associated with type 2 diabetes mellitus ([place]) 04/29/2021   Uncontrolled type 2 diabetes mellitus with hyperglycemia ([place]) 04/29/2021   Headache in front of head 12/07/2020   Panic attacks 12/07/2020   Chest congestion 12/07/2020   Pure hypercholesterolemia 01/12/2020   Dyspnea    SOB (shortness of breath) 09/15/2018   Essential hypertension 04/26/2018   OSA (obstructive sleep apnea) 03/04/2018   BPH (benign prostatic hyperplasia) 10/07/2015   EIC (epidermal inclusion cyst) 07/23/2015   Tobacco abuse 07/02/2015   SVT (supraventricular tachycardia) ([facility])    Intermittent palpitations 04/21/2014    Bradycardia 04/21/2014   Morbid obesity ([place]) 04/21/2014   Chest pain 04/20/2014   Cirrhosis of liver ([place]) 11/05/2013   GERD (gastroesophageal reflux disease) 07/18/2012   Screening for colorectal cancer 07/18/2012   Pulmonary nodule 02/14/2012    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   CHOLECYSTECTOMY     COLONOSCOPY  10/04/2012   SLF:ONE/8 SIMPLE ADENOMA, TICS, SML IH   ESOPHAGOGASTRODUODENOSCOPY N/A 09/04/2014   Procedure: ESOPHAGOGASTRODUODENOSCOPY (EGD);  Surgeon: Danie Binder, MD;  Location: [facility];  Service: Endoscopy;  Laterality: N/A;  145   GALLBLADDER SURGERY     JAW BROKEN     LIPOMA REMOVALS     SEVERAL   RIGHT/LEFT HEART CATH AND CORONARY ANGIOGRAPHY N/A 10/08/2018   Procedure: RIGHT/LEFT HEART CATH AND CORONARY ANGIOGRAPHY;  Surgeon: Martinique, Peter M, MD;  Location: [facility] LAB;  Service: Cardiovascular;  Laterality: N/A;   SUPRAVENTRICULAR TACHYCARDIA ABLATION N/A 06/22/2014   Procedure: SUPRAVENTRICULAR TACHYCARDIA ABLATION;  Surgeon: Evans Lance, MD;  Location: [facility] CATH LAB;  Service: Cardiovascular;  Laterality: N/A;       Family History  Problem Relation Age of Onset   Emphysema Mother    COPD Mother    Early death Mother    Hyperlipidemia Father    Stroke Father    Diabetes Father    High Cholesterol Father    Colon cancer Neg Hx  Social History   Tobacco Use   Smoking status: Every Day    Packs/day: 1.00    Years: 35.00    Pack years: 35.00    Types: Cigarettes   Smokeless tobacco: Never   Tobacco comments:    smokes 1-1.5 packs per day  Vaping Use   Vaping Use: Never used  Substance Use Topics   Alcohol use: No   Drug use: No    Comment: in the 1980s, smoke crack. None since Dec 2011.     Home Medications Prior to Admission medications   Medication Sig Start Date End Date Taking? Authorizing Provider  albuterol (VENTOLIN HFA) 108 (90 Base) MCG/ACT  inhaler Inhale 2 puffs into the lungs every 6 (six) hours as needed for wheezing or shortness of breath. 06/19/21  Yes Milton Ferguson, MD  dextromethorphan-guaiFENesin [facility] DM) 30-600 MG 12hr tablet Take 1 tablet by mouth 2 (two) times daily. 06/19/21  Yes Milton Ferguson, MD  predniSONE (DELTASONE) 10 MG tablet Take 2 tablets (20 mg total) by mouth daily. 06/19/21  Yes Milton Ferguson, MD  albuterol (PROVENTIL) (2.5 MG/3ML) 0.083% nebulizer solution Take 3 mLs (2.5 mg total) by nebulization every 6 (six) hours as needed for wheezing or shortness of breath. 07/26/18   Eustaquio Maize, MD  albuterol (VENTOLIN HFA) 108 (90 Base) MCG/ACT inhaler Inhale 2 puffs into the lungs every 6 (six) hours as needed for wheezing or shortness of breath. 04/11/21   Evelina Dun A, FNP  aspirin EC 81 MG tablet Take 1 tablet (81 mg total) by mouth daily. 09/18/18   Eugenie Filler, MD  aspirin-acetaminophen-caffeine (EXCEDRIN MIGRAINE) [phone] MG tablet Take 1 tablet by mouth every 6 (six) hours as needed for headache. Patient not taking: Reported on 05/04/2021 12/07/20   Ivy Lynn, NP  atorvastatin (LIPITOR) 40 MG tablet TAKE 1 TABLET DAILY AT 6PM 04/19/21   Ronnie Doss M, DO  azithromycin (ZITHROMAX Z-PAK) 250 MG tablet Take 2 tablets (500 mg) PO today, then 1 tablet (250 mg) PO daily x4 days. 06/14/21   Loman Brooklyn, FNP  benzonatate (TESSALON PERLES) 100 MG capsule Take 1 capsule (100 mg total) by mouth 3 (three) times daily as needed for cough. 06/17/21   Dettinger, Fransisca Kaufmann, MD  budesonide-formoterol (SYMBICORT) 80-4.5 MCG/ACT inhaler Inhale 2 puffs into the lungs 2 (two) times daily. Patient taking differently: Inhale 2 puffs into the lungs daily as needed (Shortness of breath). 12/23/20   Hassell Done Mary-Margaret, FNP  doxazosin (CARDURA) 1 MG tablet Take 1 tablet (1 mg total) by mouth daily. 01/18/21   [name], DO  fluticasone (FLONASE) 50 MCG/ACT nasal spray Place 2 sprays into both  nostrils daily. 04/29/21   [name], DO  hydrOXYzine (ATARAX/VISTARIL) 10 MG tablet Take 1 tablet (10 mg total) by mouth 3 (three) times daily as needed. Patient not taking: Reported on 05/04/2021 12/07/20   Ivy Lynn, NP  metFORMIN (GLUCOPHAGE) 500 MG tablet Take 1 tablet (500 mg total) by mouth 2 (two) times daily with a meal. 03/01/21   Ronnie Doss M, DO  nystatin (MYCOSTATIN/NYSTOP) powder Apply 1 application topically 3 (three) times daily. x14 days per flare Patient taking differently: Apply 1 application topically daily as needed (rash). 08/16/20   Ronnie Doss M, DO  omeprazole (PRILOSEC) 40 MG capsule TAKE ONE (1) CAPSULE EACH DAY 04/19/21   Ronnie Doss M, DO  polyethylene glycol-electrolytes (NULYTELY) 420 g solution As directed 02/09/21   Eloise Harman, DO  polyethylene glycol-electrolytes (TRILYTE) 420 g solution Take 4,000 mLs by mouth as directed. 06/09/21   Eloise Harman, DO  predniSONE (STERAPRED UNI-PAK 21 TAB) 10 MG (21) TBPK tablet As directed x 6 days 06/14/21   Loman Brooklyn, FNP  promethazine-dextromethorphan (PROMETHAZINE-DM) 6.25-15 MG/5ML syrup Take 5 mLs by mouth 4 (four) times daily as needed for cough. 06/17/21   Dettinger, Fransisca Kaufmann, MD    Allergies    Patient has no known allergies.  Review of Systems   Review of Systems  Constitutional:  Negative for appetite change and fatigue.  HENT:  Negative for congestion, ear discharge and sinus pressure.   Eyes:  Negative for discharge.  Respiratory:  Positive for shortness of breath and wheezing. Negative for cough.   Cardiovascular:  Negative for chest pain.  Gastrointestinal:  Negative for abdominal pain and diarrhea.  Genitourinary:  Negative for frequency and hematuria.  Musculoskeletal:  Negative for back pain.  Skin:  Negative for rash.  Neurological:  Negative for seizures and headaches.  Psychiatric/Behavioral:  Negative for hallucinations.    Physical Exam Updated Vital  Signs BP 118/71 (BP Location: Left Arm)   Pulse (!) 52   Temp 98.7 F (37.1 C) (Oral)   Resp 18   Ht 5\' 5"  (1.651 m)   Wt 128.4 kg   SpO2 96%   BMI 47.09 kg/m   Physical Exam Vitals and nursing note reviewed.  Constitutional:      Appearance: He is well-developed.  HENT:     Head: Normocephalic.     Nose: Nose normal.  Eyes:     General: No scleral icterus.    Conjunctiva/sclera: Conjunctivae normal.  Neck:     Thyroid: No thyromegaly.  Cardiovascular:     Rate and Rhythm: Normal rate and regular rhythm.     Heart sounds: No murmur heard.   No friction rub. No gallop.  Pulmonary:     Breath sounds: No stridor. Wheezing present. No rales.  Chest:     Chest wall: No tenderness.  Abdominal:     General: There is no distension.     Tenderness: There is no abdominal tenderness. There is no rebound.  Musculoskeletal:        General: Normal range of motion.     Cervical back: Neck supple.  Lymphadenopathy:     Cervical: No cervical adenopathy.  Skin:    Findings: No erythema or rash.  Neurological:     Mental Status: He is alert and oriented to person, place, and time.     Motor: No abnormal muscle tone.     Coordination: Coordination normal.  Psychiatric:        Behavior: Behavior normal.    ED Results / Procedures / Treatments   Labs (all labs ordered are listed, but only abnormal results are displayed) Labs Reviewed  CBC WITH DIFFERENTIAL/PLATELET - Abnormal; Notable for the following components:      Result Value   Platelets 133 (*)    All other components within normal limits  COMPREHENSIVE METABOLIC PANEL - Abnormal; Notable for the following components:   Glucose, Bld 140 (*)    Alkaline Phosphatase 21 (*)    All other components within normal limits  RESP PANEL BY RT-PCR (FLU A&B, COVID) [id number]    EKG None  Radiology DG Chest Port 1 View  Result Date: 06/19/2021 CLINICAL DATA:  Cough EXAM: PORTABLE CHEST 1 VIEW COMPARISON:  08/27/2020, CT  04/28/2017 FINDINGS: Calcified granuloma in the left upper lung.  No acute consolidation or effusion. Borderline to mild cardiomegaly. No pneumothorax IMPRESSION: No active disease. Electronically Signed   By: Donavan Foil M.D.   On: 06/19/2021 17:10    Procedures Procedures   Medications Ordered in ED Medications  albuterol (VENTOLIN HFA) 108 (90 Base) MCG/ACT inhaler 2 puff (2 puffs Inhalation Given 06/19/21 1637)  guaiFENesin (MUCINEX) 12 hr tablet 600 mg (600 mg Oral Given 06/19/21 1636)    ED Course  I have reviewed the triage vital signs and the nursing notes.  Pertinent labs & imaging results that were available during my care of the patient were reviewed by me and considered in my medical decision making (see chart for details).    MDM Rules/Calculators/A&P                          Patient with cough and COPD exacerbation.  Labs and x-rays unremarkable.  Patient is put on prednisone and will continue the Zithromax along with albuterol inhaler and an expectorant  Final Clinical Impression(s) / ED Diagnoses Final diagnoses:  COPD exacerbation ([place])    Rx / DC Orders ED Discharge Orders          Ordered    predniSONE (DELTASONE) 10 MG tablet  Daily        06/19/21 1812    dextromethorphan-guaiFENesin (MUCINEX DM) 30-600 MG 12hr tablet  2 times daily        06/19/21 1812    albuterol (VENTOLIN HFA) 108 (90 Base) MCG/ACT inhaler  Every 6 hours PRN        06/19/21 1812             Milton Ferguson, MD 06/19/21 1821

## 2021-06-19 NOTE — ED Triage Notes (Signed)
Pt presents to ED with complaints of SOB and cough for "quiet a while" states he just doesn't like coming to the hospital. Thought he just had a chest cold and thought it would go away in a couple of days. Pt refused wheelchair and ambulated to RM 7, O2 sats upon arrival to RM 7 was 98% on RA

## 2021-06-19 NOTE — Discharge Instructions (Addendum)
Follow-up with your doctor next week for recheck 

## 2021-06-21 ENCOUNTER — Other Ambulatory Visit: Payer: Self-pay | Admitting: Family Medicine

## 2021-06-28 ENCOUNTER — Ambulatory Visit (INDEPENDENT_AMBULATORY_CARE_PROVIDER_SITE_OTHER): Payer: Self-pay | Admitting: Pulmonary Disease

## 2021-06-28 ENCOUNTER — Other Ambulatory Visit: Payer: Self-pay

## 2021-06-28 ENCOUNTER — Encounter: Payer: Self-pay | Admitting: Pulmonary Disease

## 2021-06-28 VITALS — BP 136/80 | HR 64 | Temp 98.2°F | Ht 65.0 in | Wt 285.1 lb

## 2021-06-28 DIAGNOSIS — Z720 Tobacco use: Secondary | ICD-10-CM

## 2021-06-28 DIAGNOSIS — Z6841 Body Mass Index (BMI) 40.0 and over, adult: Secondary | ICD-10-CM

## 2021-06-28 DIAGNOSIS — G4733 Obstructive sleep apnea (adult) (pediatric): Secondary | ICD-10-CM

## 2021-06-28 DIAGNOSIS — Z23 Encounter for immunization: Secondary | ICD-10-CM

## 2021-06-28 DIAGNOSIS — R0609 Other forms of dyspnea: Secondary | ICD-10-CM

## 2021-06-28 DIAGNOSIS — R062 Wheezing: Secondary | ICD-10-CM

## 2021-06-28 DIAGNOSIS — R0982 Postnasal drip: Secondary | ICD-10-CM

## 2021-06-28 DIAGNOSIS — J411 Mucopurulent chronic bronchitis: Secondary | ICD-10-CM

## 2021-06-28 MED ORDER — AZELASTINE HCL 0.15 % NA SOLN: 1.0000 | Freq: Two times a day (BID) | NASAL | Status: DC

## 2021-06-28 MED ORDER — GUAIFENESIN ER 600 MG PO TB12: 1200.0000 mg | ORAL_TABLET | Freq: Two times a day (BID) | ORAL | Status: DC | PRN

## 2021-06-28 MED ORDER — ALBUTEROL SULFATE (2.5 MG/3ML) 0.083% IN NEBU: 2.5000 mg | INHALATION_SOLUTION | Freq: Four times a day (QID) | RESPIRATORY_TRACT | 3 refills | Status: DC | PRN

## 2021-06-28 NOTE — Progress Notes (Signed)
[facility]  Chief Complaint  Patient presents with   Follow-up    SOB and cough have been the same since OV last year. Recently, feels like cough has been more frequent and productive the last few weeks. Coughing up clear mucus.     Constitutional:  BP 136/80 (BP Location: Left Arm, Patient Position: Sitting)   Pulse 64   Temp 98.2 F (36.8 C) (Temporal)   Ht 5\' 5"  (1.651 m)   Wt 285 lb 1.9 oz (129.3 kg)   SpO2 97%   BMI 47.45 kg/m   Past Medical History:  SVT, Gallstone pancreatitis, HTN, HLD, Hep C, GERD  Past Surgical History:  His  has a past surgical history that includes JAW BROKEN; Cholecystectomy; LIPOMA REMOVALS; Colonoscopy (10/04/2012); Ablation (06-22-14); supraventricular tachycardia ablation (N/A, 06/22/2014); Esophagogastroduodenoscopy (N/A, 09/04/2014); Gallbladder surgery; and RIGHT/LEFT HEART CATH AND CORONARY ANGIOGRAPHY (N/A, 10/08/2018).  Brief Summary:  Thomas Ball is a 60 y.o. male smoker with OSA and dyspnea.      Subjective:   He was caring for his wife who had cancer.  She passed away about a month ago.  He wasn't able to keep up with his health then.  He was smoking more and gained weight.  He was getting more short of breath with walking.  He has sinus congestion and post nasal drip. Has been getting cough.  Feels like he has phlegm, but has trouble coughing it up.  Went to ER in September.  CXR from 06/19/21 calcified granuloma in Lt lung.  He feels CPAP helps, but has been difficult to use due to coughing spells.  His wife had home oxygen.  He used it intermittently and feels this helped.  His SpO2 has gotten into mid 80's at home.  He maintained SpO2 > 90% on room air with walking today.  Physical Exam:   Appearance - well kempt   ENMT - no sinus tenderness, no oral exudate, no LAN, Mallampati 4 airway, no stridor, poor dentition, deviated septum  Respiratory - equal breath sounds bilaterally, no  wheezing or rales  CV - s1s2 regular rate and rhythm, no murmurs  Ext - no clubbing, no edema  Skin - no rashes  Psych - normal mood and affect    Pulmonary testing:  PFT 12/02/18 >> FEV1 2.78 (92%), FEV1% 79, TLC 4.83 (83%), DLCO 97%  Chest Imaging:  CT angio chest 04/28/17 >> calcified granuloma LUL  Sleep Tests:  PSG 08/01/17 >> AHI 40.4, SpO2 low 74% Auto CPAP 05/05/20 to 06/03/20 >> used on 21 of 30 nights with average 1 hrs 21 min.  Average AHI 39.3 with median CPAP 11 and 95 th percentile CPAP 13 cm H2O  Cardiac Tests:  Echo 09/16/18 >> EF 60 to 65%, mild LVH, grade 1 DD RHC/LHC 10/08/18 >> no CAD, mild pulmonary HTN  Social History:  He  reports that he has been smoking cigarettes. He has a 35.00 pack-year smoking history. He has never used smokeless tobacco. He reports that he does not drink alcohol and does not use drugs.  Family History:  His family history includes COPD in his mother; Diabetes in his father; Early death in his mother; Emphysema in his mother; High Cholesterol in his father; Hyperlipidemia in his father; Stroke in his father.     Assessment/Plan:   Obstructive sleep apnea. - insufficient financial resources are significant restraint for optimal CPAP therapy - he will continue to purchase supplies on his  own  - continue auto CPAP   Dyspnea on exertion. - likely related to obesity and deconditioning - discussed options to assist with weight loss regarding dietary modifications - encouraged him to keep up weight loss efforts   Tobacco abuse. - he will continue process of gradually quitting  Chronic bronchitis. - advised him to use symbicort bid - prn albuterol; refilled nebulizer medication - flu shot today - prn mucinex  Post nasal drip with deviated septum. - nasal irrigation, flonase, astepro  Lack of health insurance. - advised him to check if he might be eligible for [place] Medicaid  Time Spent Involved in Patient Care on Day of  Examination:  34 minutes  Follow up:   Patient Instructions  Use symbicort two puffs in the morning and two puffs in the evening, and rinse your mouth after each use  Use mucinex 1200 mg twice per day as needed to help with cough and chest congestion  Albuterol every 6 hours as  needed for cough, wheeze, chest congestion or shortness of breath  You can look up [employer] online to see about getting a portable oxygen concentrator  Use  flonase 1 spray in each nostril daily  Use astepro 1 spray in each nostril in the morning and in the evening  Use saline nasal spray daily  Flu shot today  Follow up in 3 months  Medication List:   Allergies as of 06/28/2021   No Known Allergies      Medication List        Accurate as of June 28, 2021 10:33 AM. If you have any questions, ask your nurse or doctor.          STOP taking these medications    aspirin EC 81 MG tablet Stopped by: Chesley Mires, MD   atorvastatin 40 MG tablet Commonly known as: LIPITOR Stopped by: Chesley Mires, MD   azithromycin 250 MG tablet Commonly known as: Zithromax Z-Pak Stopped by: Chesley Mires, MD   dextromethorphan-guaiFENesin 30-600 MG 12hr tablet Commonly known as: [place] DM Stopped by: Chesley Mires, MD   Excedrin Migraine [phone] MG tablet Generic drug: aspirin-acetaminophen-caffeine Stopped by: Chesley Mires, MD   nystatin powder Commonly known as: MYCOSTATIN/NYSTOP Stopped by: Chesley Mires, MD   predniSONE 10 MG (21) Tbpk tablet Commonly known as: STERAPRED UNI-PAK 21 TAB Stopped by: Chesley Mires, MD   predniSONE 10 MG tablet Commonly known as: DELTASONE Stopped by: Chesley Mires, MD       TAKE these medications    albuterol 108 (90 Base) MCG/ACT inhaler Commonly known as: VENTOLIN HFA Inhale 2 puffs into the lungs every 6 (six) hours as needed for wheezing or shortness of breath. What changed: Another medication with the same name was removed. Continue taking this  medication, and follow the directions you see here. Changed by: Chesley Mires, MD   albuterol (2.5 MG/3ML) 0.083% nebulizer solution Commonly known as: PROVENTIL Take 3 mLs (2.5 mg total) by nebulization every 6 (six) hours as needed for wheezing or shortness of breath. What changed: Another medication with the same name was removed. Continue taking this medication, and follow the directions you see here. Changed by: Chesley Mires, MD   Azelastine HCl 0.15 % Soln Commonly known as: AstePro [address] 1 spray into the nose in the morning and at bedtime. Started by: Chesley Mires, MD   benzonatate 100 MG capsule Commonly known as: Tessalon Perles Take 1 capsule (100 mg total) by mouth 3 (three) times daily as needed for  cough.   budesonide-formoterol 80-4.5 MCG/ACT inhaler Commonly known as: SYMBICORT Inhale 2 puffs into the lungs 2 (two) times daily. What changed:  when to take this reasons to take this   doxazosin 1 MG tablet Commonly known as: Cardura Take 1 tablet (1 mg total) by mouth daily.   fluticasone 50 MCG/ACT nasal spray Commonly known as: FLONASE Place 2 sprays into both nostrils daily.   guaiFENesin 600 MG 12 hr tablet Commonly known as: Mucinex Take 2 tablets (1,200 mg total) by mouth 2 (two) times daily as needed for cough or to loosen phlegm. Started by: Chesley Mires, MD   hydrOXYzine 10 MG tablet Commonly known as: ATARAX/VISTARIL Take 1 tablet (10 mg total) by mouth 3 (three) times daily as needed.   metFORMIN 500 MG tablet Commonly known as: GLUCOPHAGE Take 1 tablet (500 mg total) by mouth 2 (two) times daily with a meal.   omeprazole 40 MG capsule Commonly known as: PRILOSEC TAKE ONE (1) CAPSULE EACH DAY   polyethylene glycol-electrolytes 420 g solution Commonly known as: NuLYTELY As directed   polyethylene glycol-electrolytes 420 g solution Commonly known as: TriLyte Take 4,000 mLs by mouth as directed.   promethazine-dextromethorphan  6.25-15 MG/5ML syrup Commonly known as: PROMETHAZINE-DM Take 5 mLs by mouth 4 (four) times daily as needed for cough.        Signature:  Chesley Mires, MD [place] Pager - [phone] 06/28/2021, 10:33 AM

## 2021-06-28 NOTE — Patient Instructions (Signed)
Use symbicort two puffs in the morning and two puffs in the evening, and rinse your mouth after each use  Use mucinex 1200 mg twice per day as needed to help with cough and chest congestion  Albuterol every 6 hours as  needed for cough, wheeze, chest congestion or shortness of breath  You can look up [employer] online to see about getting a portable oxygen concentrator  Use  flonase 1 spray in each nostril daily  Use astepro 1 spray in each nostril in the morning and in the evening  Use saline nasal spray daily  Flu shot today  Follow up in 3 months

## 2021-06-29 NOTE — Patient Instructions (Signed)
Thomas Ball  06/29/2021     @PREFPERIOPPHARMACY @   Your procedure is scheduled on 07/04/2021.   Report to [name] at  [phone] A.M.   Call this number if you have problems the morning of surgery:  [phone]   Remember:   Follow the diet and prep instructions given to you by the office.   DO NOT take any medication for diabetes the morning of your procedure.    Use your nebulizer and your inhalers before you come and bring your rescue inhaler with you.   Take these medicines the morning of surgery with A SIP OF WATER        cardura, hydroxyzine, prilosec.    Do not wear jewelry, make-up or nail polish.  Do not wear lotions, powders, or perfumes, or deodorant.  Do not shave 48 hours prior to surgery.  Men may shave face and neck.  Do not bring valuables to the hospital.  [facility] is not responsible for any belongings or valuables.  Contacts, dentures or bridgework may not be worn into surgery.  Leave your suitcase in the car.  After surgery it may be brought to your room.  For patients admitted to the hospital, discharge time will be determined by your treatment team.  Patients discharged the day of surgery will not be allowed to drive home and must have someone with them for 24 hours.   Special instructions:   DO NOT smoke tobacco or vape for 24 hours before your procedure.  Please read over the following fact sheets that you were given. Anesthesia Post-op Instructions and Care and Recovery After Surgery      Upper Endoscopy, Adult, Care After This sheet gives you information about how to care for yourself after your procedure. Your health care provider may also give you more specific instructions. If you have problems or questions, contact your health care provider. What can I expect after the procedure? After the procedure, it is common to have: A sore throat. Mild stomach pain or discomfort. Bloating. Nausea. Follow these instructions at  home:  Follow instructions from your health care provider about what to eat or drink after your procedure. Return to your normal activities as told by your health care provider. Ask your health care provider what activities are safe for you. Take over-the-counter and prescription medicines only as told by your health care provider. If you were given a sedative during the procedure, it can affect you for several hours. Do not drive or operate machinery until your health care provider says that it is safe. Keep all follow-up visits as told by your health care provider. This is important. Contact a health care provider if you have: A sore throat that lasts longer than one day. Trouble swallowing. Get help right away if: You vomit blood or your vomit looks like coffee grounds. You have: A fever. Bloody, black, or tarry stools. A severe sore throat or you cannot swallow. Difficulty breathing. Severe pain in your chest or abdomen. Summary After the procedure, it is common to have a sore throat, mild stomach discomfort, bloating, and nausea. If you were given a sedative during the procedure, it can affect you for several hours. Do not drive or operate machinery until your health care provider says that it is safe. Follow instructions from your health care provider about what to eat or drink after your procedure. Return to your normal activities as told by your health care  provider. This information is not intended to replace advice given to you by your health care provider. Make sure you discuss any questions you have with your health care provider. Document Revised: 09/09/2019 Document Reviewed: 02/11/2018 Elsevier Patient Education  2022 [place]. Colonoscopy, Adult, Care After This sheet gives you information about how to care for yourself after your procedure. Your health care provider may also give you more specific instructions. If you have problems or questions, contact your health  care provider. What can I expect after the procedure? After the procedure, it is common to have: A small amount of blood in your stool for 24 hours after the procedure. Some gas. Mild cramping or bloating of your abdomen. Follow these instructions at home: Eating and drinking  Drink enough fluid to keep your urine pale yellow. Follow instructions from your health care provider about eating or drinking restrictions. Resume your normal diet as instructed by your health care provider. Avoid heavy or fried foods that are hard to digest. Activity Rest as told by your health care provider. Avoid sitting for a long time without moving. Get up to take short walks every 1-2 hours. This is important to improve blood flow and breathing. Ask for help if you feel weak or unsteady. Return to your normal activities as told by your health care provider. Ask your health care provider what activities are safe for you. Managing cramping and bloating  Try walking around when you have cramps or feel bloated. Apply heat to your abdomen as told by your health care provider. Use the heat source that your health care provider recommends, such as a moist heat pack or a heating pad. Place a towel between your skin and the heat source. Leave the heat on for 20-30 minutes. Remove the heat if your skin turns bright red. This is especially important if you are unable to feel pain, heat, or cold. You may have a greater risk of getting burned. General instructions If you were given a sedative during the procedure, it can affect you for several hours. Do not drive or operate machinery until your health care provider says that it is safe. For the first 24 hours after the procedure: Do not sign important documents. Do not drink alcohol. Do your regular daily activities at a slower pace than normal. Eat soft foods that are easy to digest. Take over-the-counter and prescription medicines only as told by your health care  provider. Keep all follow-up visits as told by your health care provider. This is important. Contact a health care provider if: You have blood in your stool 2-3 days after the procedure. Get help right away if you have: More than a small spotting of blood in your stool. Large blood clots in your stool. Swelling of your abdomen. Nausea or vomiting. A fever. Increasing pain in your abdomen that is not relieved with medicine. Summary After the procedure, it is common to have a small amount of blood in your stool. You may also have mild cramping and bloating of your abdomen. If you were given a sedative during the procedure, it can affect you for several hours. Do not drive or operate machinery until your health care provider says that it is safe. Get help right away if you have a lot of blood in your stool, nausea or vomiting, a fever, or increased pain in your abdomen. This information is not intended to replace advice given to you by your health care provider. Make sure you discuss any  questions you have with your health care provider. Document Revised: 09/05/2019 Document Reviewed: 04/07/2019 Elsevier Patient Education  [place] After This sheet gives you information about how to care for yourself after your procedure. Your health care provider may also give you more specific instructions. If you have problems or questions, contact your health care provider. What can I expect after the procedure? After the procedure, it is common to have: Tiredness. Forgetfulness about what happened after the procedure. Impaired judgment for important decisions. Nausea or vomiting. Some difficulty with balance. Follow these instructions at home: For the time period you were told by your health care provider:   Rest as needed. Do not participate in activities where you could fall or become injured. Do not drive or use machinery. Do not drink alcohol. Do not  take sleeping pills or medicines that cause drowsiness. Do not make important decisions or sign legal documents. Do not take care of children on your own. Eating and drinking Follow the diet that is recommended by your health care provider. Drink enough fluid to keep your urine pale yellow. If you vomit: Drink water, juice, or soup when you can drink without vomiting. Make sure you have little or no nausea before eating solid foods. General instructions Have a responsible adult stay with you for the time you are told. It is important to have someone help care for you until you are awake and alert. Take over-the-counter and prescription medicines only as told by your health care provider. If you have sleep apnea, surgery and certain medicines can increase your risk for breathing problems. Follow instructions from your health care provider about wearing your sleep device: Anytime you are sleeping, including during daytime naps. While taking prescription pain medicines, sleeping medicines, or medicines that make you drowsy. Avoid smoking. Keep all follow-up visits as told by your health care provider. This is important. Contact a health care provider if: You keep feeling nauseous or you keep vomiting. You feel light-headed. You are still sleepy or having trouble with balance after 24 hours. You develop a rash. You have a fever. You have redness or swelling around the IV site. Get help right away if: You have trouble breathing. You have new-onset confusion at home. Summary For several hours after your procedure, you may feel tired. You may also be forgetful and have poor judgment. Have a responsible adult stay with you for the time you are told. It is important to have someone help care for you until you are awake and alert. Rest as told. Do not drive or operate machinery. Do not drink alcohol or take sleeping pills. Get help right away if you have trouble breathing, or if you suddenly  become confused. This information is not intended to replace advice given to you by your health care provider. Make sure you discuss any questions you have with your health care provider. Document Revised: 05/27/2020 Document Reviewed: 08/14/2019 Elsevier Patient Education  2022 [employer].

## 2021-06-30 ENCOUNTER — Encounter (HOSPITAL_COMMUNITY)
Admission: RE | Admit: 2021-06-30 | Discharge: 2021-06-30 | Disposition: A | Discharge status: Home / Self Care | Payer: Self-pay | Source: Ambulatory Visit | Attending: Gastroenterology | Admitting: Gastroenterology

## 2021-07-04 ENCOUNTER — Ambulatory Visit (HOSPITAL_COMMUNITY): Admission: RE | Admit: 2021-07-04 | Payer: Self-pay | Source: Home / Self Care

## 2021-07-04 ENCOUNTER — Encounter (HOSPITAL_COMMUNITY): Admission: RE | Payer: Self-pay | Source: Home / Self Care

## 2021-07-04 SURGERY — COLONOSCOPY WITH PROPOFOL
Anesthesia: Monitor Anesthesia Care

## 2021-07-13 ENCOUNTER — Other Ambulatory Visit: Payer: Self-pay | Admitting: Family Medicine

## 2021-07-15 ENCOUNTER — Ambulatory Visit: Payer: Self-pay | Admitting: Family Medicine

## 2021-07-21 ENCOUNTER — Ambulatory Visit: Payer: Self-pay | Admitting: Pulmonary Disease

## 2021-08-04 ENCOUNTER — Encounter: Payer: Self-pay | Admitting: Family Medicine

## 2021-08-04 ENCOUNTER — Ambulatory Visit (INDEPENDENT_AMBULATORY_CARE_PROVIDER_SITE_OTHER): Payer: Self-pay | Admitting: Family Medicine

## 2021-08-04 DIAGNOSIS — J209 Acute bronchitis, unspecified: Secondary | ICD-10-CM

## 2021-08-04 MED ORDER — BENZONATATE 200 MG PO CAPS
200.0000 mg | ORAL_CAPSULE | Freq: Two times a day (BID) | ORAL | 0 refills | Status: DC | PRN
Start: 2021-08-04 — End: 2021-09-01

## 2021-08-04 MED ORDER — DOXYCYCLINE HYCLATE 100 MG PO TABS: 100.0000 mg | ORAL_TABLET | Freq: Two times a day (BID) | ORAL | 0 refills | Status: AC

## 2021-08-04 MED ORDER — ALBUTEROL SULFATE HFA 108 (90 BASE) MCG/ACT IN AERS
2.0000 | INHALATION_SPRAY | Freq: Four times a day (QID) | RESPIRATORY_TRACT | 2 refills | Status: DC | PRN
Start: 2021-08-04 — End: 2022-01-10

## 2021-08-04 MED ORDER — PREDNISONE 20 MG PO TABS
40.0000 mg | ORAL_TABLET | Freq: Every day | ORAL | 0 refills | Status: AC
Start: 2021-08-04 — End: 2021-08-09

## 2021-08-04 MED ORDER — ALBUTEROL SULFATE (2.5 MG/3ML) 0.083% IN NEBU
2.5000 mg | INHALATION_SOLUTION | Freq: Four times a day (QID) | RESPIRATORY_TRACT | 1 refills | Status: DC | PRN
Start: 2021-08-04 — End: 2023-12-11

## 2021-08-04 NOTE — Progress Notes (Signed)
Virtual Visit via telephone Note Due to COVID-19 pandemic this visit was conducted virtually. This visit type was conducted due to national recommendations for restrictions regarding the COVID-19 Pandemic (e.g. social distancing, sheltering in place) in an effort to limit this patient's exposure and mitigate transmission in our community. All issues noted in this document were discussed and addressed.  A physical exam was not performed with this format.   I connected with Thomas Ball on 08/04/2021 at 1450 by telephone and verified that I am speaking with the correct person using two identifiers. Thomas Ball is currently located at home and patient is currently with them during visit. The provider, Monia Pouch, FNP is located in their office at time of visit.  I discussed the limitations, risks, security and privacy concerns of performing an evaluation and management service by telephone and the availability of in person appointments. I also discussed with the patient that there may be a patient responsible charge related to this service. The patient expressed understanding and agreed to proceed.  Subjective:  Patient ID: Thomas Ball, male    DOB: [birth year], 60 y.o.   MRN: [id number]  Chief Complaint:  No chief complaint on file.   HPI: Thomas Ball is a 60 y.o. male presenting on 08/04/2021 for No chief complaint on file.   Patient presents today with ongoing cough over 3 weeks with chest congestion and sputum production.  States he has been taking Mucinex without relief of symptoms.  He denies fever, chest pain, palpitations, shortness of breath, weakness, or fatigue.  No known sick exposures.    Relevant past medical, surgical, family, and social history reviewed and updated as indicated.  Allergies and medications reviewed and updated.   Past Medical History:  Diagnosis Date  . GERD (gastroesophageal reflux disease)   . HCV (hepatitis C virus) DR. ZACKS   AUG  2013 VIRAL LOAD UNDETECTABLE  . History of cardiac catheterization    a. normal cors by cath in 09/2018 with mild pulmonary HTN  . Hypercholesteremia   . Hypertension   . Pancreatitis, gallstone   . 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 Sleep apnea   . SVT (supraventricular tachycardia) (HCC)     Past Surgical History:  Procedure Laterality Date  . 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 CHOLECYSTECTOMY    . COLONOSCOPY  10/04/2012   SLF:ONE/8 SIMPLE ADENOMA, TICS, SML IH  . ESOPHAGOGASTRODUODENOSCOPY N/A 09/04/2014   Procedure: ESOPHAGOGASTRODUODENOSCOPY (EGD);  Surgeon: Danie Binder, MD;  Location: [facility];  Service: Endoscopy;  Laterality: N/A;  145  . GALLBLADDER SURGERY    . JAW BROKEN    . LIPOMA REMOVALS     SEVERAL  . RIGHT/LEFT HEART CATH AND CORONARY ANGIOGRAPHY N/A 10/08/2018   Procedure: RIGHT/LEFT HEART CATH AND CORONARY ANGIOGRAPHY;  Surgeon: Martinique, Peter M, MD;  Location: [facility];  Service: Cardiovascular;  Laterality: N/A;  . SUPRAVENTRICULAR TACHYCARDIA ABLATION N/A 06/22/2014   Procedure: SUPRAVENTRICULAR TACHYCARDIA ABLATION;  Surgeon: Evans Lance, MD;  Location: [facility] CATH LAB;  Service: Cardiovascular;  Laterality: N/A;    Social History   Socioeconomic History  . Marital status: Widowed    Spouse name: Not on file  . Number of children: Not on file  . Years of education: Not on file  . Highest education level: Not on file  Occupational History  . Occupation: concrete work    Fish farm manager:  SELF-EMPLOYED  Tobacco Use  . Smoking status: Every Day    Packs/day: 1.00    Years: 35.00    Pack years: 35.00    Types: Cigarettes  . Smokeless tobacco: Never  . Tobacco comments:    smokes 1-1.5 packs per day  Vaping Use  . Vaping Use: Never used  Substance and Sexual Activity  . Alcohol use: No  . Drug use: No    Comment: in the 1980s, smoke crack. None since Dec 2011.   Marland Kitchen Sexual activity: Not Currently  Other  Topics Concern  . Not on file  Social History Narrative  . Not on file   Social Determinants of Health   Financial Resource Strain: Not on file  Food Insecurity: Not on file  Transportation Needs: Not on file  Physical Activity: Not on file  Stress: Not on file  Social Connections: Not on file  Intimate Partner Violence: Not on file    Outpatient Encounter Medications as of 08/04/2021  Medication Sig  . albuterol (PROVENTIL) (2.5 MG/3ML) 0.083% nebulizer solution Take 3 mLs (2.5 mg total) by nebulization every 6 (six) hours as needed for wheezing or shortness of breath.  Marland Kitchen albuterol (VENTOLIN HFA) 108 (90 Base) MCG/ACT inhaler Inhale 2 puffs into the lungs every 6 (six) hours as needed for wheezing or shortness of breath.  . benzonatate (TESSALON) 200 MG capsule Take 1 capsule (200 mg total) by mouth 2 (two) times daily as needed for cough.  . doxycycline (VIBRA-TABS) 100 MG tablet Take 1 tablet (100 mg total) by mouth 2 (two) times daily for 10 days. 1 po bid  . predniSONE (DELTASONE) 20 MG tablet Take 2 tablets (40 mg total) by mouth daily with breakfast for 5 days.  Marland Kitchen albuterol (PROVENTIL) (2.5 MG/3ML) 0.083% nebulizer solution Take 3 mLs (2.5 mg total) by nebulization every 6 (six) hours as needed for wheezing or shortness of breath.  Marland Kitchen albuterol (VENTOLIN HFA) 108 (90 Base) MCG/ACT inhaler Inhale 2 puffs into the lungs every 6 (six) hours as needed for wheezing or shortness of breath.  Marland Kitchen atorvastatin (LIPITOR) 40 MG tablet TAKE 1 TABLET DAILY AT 6PM  . Azelastine HCl (ASTEPRO CHILDRENS) 0.15 % SOLN Place 1 spray into the nose in the morning and at bedtime.  . budesonide-formoterol (SYMBICORT) 80-4.5 MCG/ACT inhaler Inhale 2 puffs into the lungs 2 (two) times daily. (Patient taking differently: Inhale 2 puffs into the lungs daily as needed (Shortness of breath).)  . doxazosin (CARDURA) 1 MG tablet Take 1 tablet (1 mg total) by mouth daily.  . fluticasone (FLONASE) 50 MCG/ACT nasal  spray Place 2 sprays into both nostrils daily.  Marland Kitchen guaiFENesin (MUCINEX) 600 MG 12 hr tablet Take 2 tablets (1,200 mg total) by mouth 2 (two) times daily as needed for cough or to loosen phlegm.  . hydrOXYzine (ATARAX/VISTARIL) 10 MG tablet Take 1 tablet (10 mg total) by mouth 3 (three) times daily as needed.  . metFORMIN (GLUCOPHAGE) 500 MG tablet Take 1 tablet (500 mg total) by mouth 2 (two) times daily with a meal.  . omeprazole (PRILOSEC) 40 MG capsule TAKE ONE (1) CAPSULE EACH DAY  . polyethylene glycol-electrolytes (NULYTELY) 420 g solution As directed  . polyethylene glycol-electrolytes (TRILYTE) 420 g solution Take 4,000 mLs by mouth as directed.  . [DISCONTINUED] benzonatate (TESSALON PERLES) 100 MG capsule Take 1 capsule (100 mg total) by mouth 3 (three) times daily as needed for cough.  . [DISCONTINUED] promethazine-dextromethorphan (PROMETHAZINE-DM) 6.25-15 MG/5ML syrup Take 5 mLs  by mouth 4 (four) times daily as needed for cough.   No facility-administered encounter medications on file as of 08/04/2021.    No Known Allergies  Review of Systems  Constitutional:  Positive for activity change. Negative for appetite change, chills, diaphoresis, fatigue, fever and unexpected weight change.  HENT:  Positive for congestion and postnasal drip.   Respiratory:  Positive for cough and wheezing. Negative for apnea, choking, chest tightness, shortness of breath and stridor.   Cardiovascular:  Negative for chest pain, palpitations and leg swelling.  Genitourinary:  Negative for decreased urine volume and difficulty urinating.  Skin:  Negative for color change and pallor.  Neurological:  Negative for weakness.  Psychiatric/Behavioral:  Negative for confusion.   All other systems reviewed and are negative.       Observations/Objective: No vital signs or physical exam, this was a telephone or virtual health encounter.  Pt alert and oriented, answers all questions appropriately, and able to  speak in full sentences.    Assessment and Plan: Diagnoses and all orders for this visit:  Bronchitis with bronchospasm Patient with ongoing bronchitis symptoms for over 3 weeks.  Will initiate doxycycline along with prednisone and Tessalon.  Will refill albuterol inhaler and albuterol nebulizer solution as patient reports he has helped.  Patient aware to follow-up for any new, worsening, or persistent symptoms.  Patient aware to complete full course of antibiotics.  Reevaluation in 2 weeks. -     predniSONE (DELTASONE) 20 MG tablet; Take 2 tablets (40 mg total) by mouth daily with breakfast for 5 days. -     doxycycline (VIBRA-TABS) 100 MG tablet; Take 1 tablet (100 mg total) by mouth 2 (two) times daily for 10 days. 1 po bid -     albuterol (VENTOLIN HFA) 108 (90 Base) MCG/ACT inhaler; Inhale 2 puffs into the lungs every 6 (six) hours as needed for wheezing or shortness of breath. -     albuterol (PROVENTIL) (2.5 MG/3ML) 0.083% nebulizer solution; Take 3 mLs (2.5 mg total) by nebulization every 6 (six) hours as needed for wheezing or shortness of breath. -     benzonatate (TESSALON) 200 MG capsule; Take 1 capsule (200 mg total) by mouth 2 (two) times daily as needed for cough.    Follow Up Instructions: Return in about 2 weeks (around 08/18/2021), or if symptoms worsen or fail to improve.    I discussed the assessment and treatment plan with the patient. The patient was provided an opportunity to ask questions and all were answered. The patient agreed with the plan and demonstrated an understanding of the instructions.   The patient was advised to call back or seek an in-person evaluation if the symptoms worsen or if the condition fails to improve as anticipated.  The above assessment and management plan was discussed with the patient. The patient verbalized understanding of and has agreed to the management plan. Patient is aware to call the clinic if they develop any new symptoms or if  symptoms persist or worsen. Patient is aware when to return to the clinic for a follow-up visit. Patient educated on when it is appropriate to go to the emergency department.    I provided 12 minutes of non-face-to-face time during this encounter. The call started at 1450. The call ended at 1502. The other time was used for coordination of care.    Monia Pouch, FNP-C [facility] [address] [place], [place] [address] [phone] 08/04/2021

## 2021-08-30 ENCOUNTER — Telehealth: Payer: Self-pay | Admitting: Pulmonary Disease

## 2021-08-30 NOTE — Telephone Encounter (Signed)
Form placed in Dr. Collie Siad folder.

## 2021-09-01 ENCOUNTER — Ambulatory Visit (INDEPENDENT_AMBULATORY_CARE_PROVIDER_SITE_OTHER): Payer: Self-pay | Admitting: Family Medicine

## 2021-09-01 ENCOUNTER — Encounter: Payer: Self-pay | Admitting: Family Medicine

## 2021-09-01 VITALS — BP 101/63 | HR 60 | Temp 98.0°F | Ht 65.0 in | Wt 293.1 lb

## 2021-09-01 DIAGNOSIS — B353 Tinea pedis: Secondary | ICD-10-CM

## 2021-09-01 MED ORDER — CLOTRIMAZOLE-BETAMETHASONE 1-0.05 % EX CREA: 1.0000 "application " | TOPICAL_CREAM | Freq: Every day | CUTANEOUS | 0 refills | Status: DC

## 2021-09-01 NOTE — Patient Instructions (Signed)
Athlete's Foot Athlete's foot (tinea pedis) is a fungal infection of the skin on your feet. It often occurs on the skin that is between or underneath the toes. It can also occur on the soles of your feet. The infection can spread from person to person (is contagious). It can also spread when a person's bare feet come in contact with the fungus on shower floors or on items such as shoes. What are the causes? This condition is caused by a fungus that grows in warm, moist places. You can get athlete's foot by sharing shoes, shower stalls, towels, and wet floors with someone who is infected. Not washing your feet or changing your socks often enough can also lead to athlete's foot. What increases the risk? This condition is more likely to develop in: Men. People who have a weak body defense system (immune system). People who have diabetes. People who use public showers, such as at a gym. People who wear heavy-duty shoes, such as industrial or military shoes. Seasons with warm, humid weather. What are the signs or symptoms? Symptoms of this condition include: Itchy areas between your toes or on the soles of your feet. White, flaky, or scaly areas between your toes or on the soles of your feet. Very itchy small blisters between your toes or on the soles of your feet. Small cuts in your skin. These cuts can become infected. Thick or discolored toenails. How is this diagnosed? This condition may be diagnosed with a physical exam and a review of your medical history. Your health care provider may also take a skin or toenail sample to examine under a microscope. How is this treated? This condition is treated with antifungal medicines. These may be applied as powders, ointments, or creams. In severe cases, an oral antifungal medicine may be given. Follow these instructions at home: Medicines Apply or take over-the-counter and prescription medicines only as told by your health care provider. Apply your  antifungal medicine as told by your health care provider. Do not stop using the antifungal even if your condition improves. Foot care Do not scratch your feet. Keep your feet dry: Wear cotton or wool socks. Change your socks every day or if they become wet. Wear shoes that allow air to flow, such as sandals or canvas tennis shoes. Wash and dry your feet, including the area between your toes. Also, wash and dry your feet: Every day or as told by your health care provider. After exercising. General instructions Do not let others use towels, shoes, nail clippers, or other personal items that touch your feet. Protect your feet by wearing sandals in wet areas, such as locker rooms and shared showers. Keep all follow-up visits. This is important. If you have diabetes, keep your blood sugar under control. Contact a health care provider if: You have a fever. You have swelling, soreness, warmth, or redness in your foot. Your feet are not getting better with treatment. Your symptoms get worse. You have new symptoms. You have severe pain. Summary Athlete's foot (tinea pedis) is a fungal infection of the skin on your feet. It often occurs on skin that is between or underneath the toes. This condition is caused by a fungus that grows in warm, moist places. Symptoms include white, flaky, or scaly areas between your toes or on the soles of your feet. This condition is treated with antifungal medicines. Keep your feet clean. Always dry them thoroughly. This information is not intended to replace advice given to you by   your health care provider. Make sure you discuss any questions you have with your health care provider. Document Revised: 01/02/2021 Document Reviewed: 01/02/2021 Elsevier Patient Education  2022 [employer]    

## 2021-09-01 NOTE — Progress Notes (Signed)
Acute Office Visit  Subjective:    Patient ID: Thomas Ball, male    DOB: 1960-10-10, 60 y.o.   MRN: [id number]  Chief Complaint  Patient presents with   feet itching    HPI Patient is in today for itchy feet. This is bilaterally and has been present for 1 month. He reports dry, scaly skin. There is a rash around his heels. He denies wounds or skin breakdown. He has tried calamine lotion with some improvement.   Past Medical History:  Diagnosis Date   GERD (gastroesophageal reflux disease)    HCV (hepatitis C virus) DR. ZACKS   AUG 2013 VIRAL LOAD UNDETECTABLE   History of cardiac catheterization    a. 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   CHOLECYSTECTOMY     COLONOSCOPY  10/04/2012   SLF:ONE/8 SIMPLE ADENOMA, TICS, SML IH   ESOPHAGOGASTRODUODENOSCOPY N/A 09/04/2014   Procedure: ESOPHAGOGASTRODUODENOSCOPY (EGD);  Surgeon: Danie Binder, MD;  Location: [facility];  Service: Endoscopy;  Laterality: N/A;  145   GALLBLADDER SURGERY     JAW BROKEN     LIPOMA REMOVALS     SEVERAL   RIGHT/LEFT HEART CATH AND CORONARY ANGIOGRAPHY N/A 10/08/2018   Procedure: RIGHT/LEFT HEART CATH AND CORONARY ANGIOGRAPHY;  Surgeon: Martinique, Peter M, MD;  Location: [place] [facility];  Service: Cardiovascular;  Laterality: N/A;   SUPRAVENTRICULAR TACHYCARDIA ABLATION N/A 06/22/2014   Procedure: SUPRAVENTRICULAR TACHYCARDIA ABLATION;  Surgeon: Evans Lance, MD;  Location: [facility] CATH LAB;  Service: Cardiovascular;  Laterality: N/A;    Family History  Problem Relation Age of Onset   Emphysema Mother    COPD Mother    Early death Mother    Hyperlipidemia Father    Stroke Father    Diabetes Father     High Cholesterol Father    Colon cancer Neg Hx     Social History   Socioeconomic History   Marital status: Widowed    Spouse name: Not on file   Number of children: Not on file   Years of education: Not on file   Highest education level: Not on file  Occupational History   Occupation: concrete work    Fish farm manager: SELF-EMPLOYED  Tobacco Use   Smoking status: Every Day    Packs/day: 1.00    Years: 35.00    Pack years: 35.00    Types: Cigarettes   Smokeless tobacco: Never   Tobacco comments:    smokes 1-1.5 packs per day  Vaping Use   Vaping Use: Never used  Substance and Sexual Activity   Alcohol use: No   Drug use: No    Comment: in the 1980s, smoke crack. None since Dec 2011.    Sexual activity: Not Currently  Other Topics Concern   Not on file  Social History Narrative   Not on file   Social Determinants of Health   Financial Resource Strain: Not on file  Food Insecurity: Not on file  Transportation Needs: Not on file  Physical Activity: Not on file  Stress: Not on file  Social Connections: Not on file  Intimate Partner Violence: Not  on file    Outpatient Medications Prior to Visit  Medication Sig Dispense Refill   albuterol (PROVENTIL) (2.5 MG/3ML) 0.083% nebulizer solution Take 3 mLs (2.5 mg total) by nebulization every 6 (six) hours as needed for wheezing or shortness of breath. 150 mL 1   albuterol (VENTOLIN HFA) 108 (90 Base) MCG/ACT inhaler Inhale 2 puffs into the lungs every 6 (six) hours as needed for wheezing or shortness of breath. 8 g 2   atorvastatin (LIPITOR) 40 MG tablet TAKE 1 TABLET DAILY AT 6PM 90 tablet 3   Azelastine HCl (ASTEPRO CHILDRENS) 0.15 % SOLN Place 1 spray into the nose in the morning and at bedtime.     doxazosin (CARDURA) 1 MG tablet Take 1 tablet (1 mg total) by mouth daily. 90 tablet 1   fluticasone (FLONASE) 50 MCG/ACT nasal spray Place 2 sprays into both nostrils daily. 16 g 6   metFORMIN (GLUCOPHAGE) 500 MG tablet Take 1  tablet (500 mg total) by mouth 2 (two) times daily with a meal. 180 tablet 3   omeprazole (PRILOSEC) 40 MG capsule TAKE ONE (1) CAPSULE EACH DAY 90 capsule 0   budesonide-formoterol (SYMBICORT) 80-4.5 MCG/ACT inhaler Inhale 2 puffs into the lungs 2 (two) times daily. (Patient not taking: Reported on 09/01/2021) 1 each 3   hydrOXYzine (ATARAX/VISTARIL) 10 MG tablet Take 1 tablet (10 mg total) by mouth 3 (three) times daily as needed. (Patient not taking: Reported on 09/01/2021) 30 tablet 0   albuterol (PROVENTIL) (2.5 MG/3ML) 0.083% nebulizer solution Take 3 mLs (2.5 mg total) by nebulization every 6 (six) hours as needed for wheezing or shortness of breath. 360 mL 3   albuterol (VENTOLIN HFA) 108 (90 Base) MCG/ACT inhaler Inhale 2 puffs into the lungs every 6 (six) hours as needed for wheezing or shortness of breath. 8 g 0   benzonatate (TESSALON) 200 MG capsule Take 1 capsule (200 mg total) by mouth 2 (two) times daily as needed for cough. 20 capsule 0   guaiFENesin (MUCINEX) 600 MG 12 hr tablet Take 2 tablets (1,200 mg total) by mouth 2 (two) times daily as needed for cough or to loosen phlegm.     polyethylene glycol-electrolytes (NULYTELY) 420 g solution As directed 4000 mL 0   polyethylene glycol-electrolytes (TRILYTE) 420 g solution Take 4,000 mLs by mouth as directed. 8000 mL 0   No facility-administered medications prior to visit.    No Known Allergies  Review of Systems As per HPI.    Objective:    Physical Exam Vitals and nursing note reviewed.  Constitutional:      General: He is not in acute distress.    Appearance: He is obese. He is not ill-appearing, toxic-appearing or diaphoretic.  Pulmonary:     Effort: Pulmonary effort is normal. No respiratory distress.  Feet:     Right foot:     Skin integrity: Dry skin present. No ulcer, blister or warmth.     Left foot:     Skin integrity: Dry skin present. No ulcer, blister, skin breakdown or warmth.     Comments: Tinea pedis  present bilaterally Neurological:     General: No focal deficit present.     Mental Status: He is alert and oriented to person, place, and time.  Psychiatric:        Mood and Affect: Mood normal.        Behavior: Behavior normal.    BP 101/63   Pulse 60   Temp 98 F (36.7 C) (  Temporal)   Ht 5\' 5"  (1.651 m)   Wt 293 lb 2 oz (133 kg)   BMI 48.78 kg/m  Wt Readings from Last 3 Encounters:  09/01/21 293 lb 2 oz (133 kg)  06/28/21 285 lb 1.9 oz (129.3 kg)  06/19/21 283 lb (128.4 kg)    Health Maintenance Due  Topic Date Due   COVID-19 Vaccine (1) Never done   OPHTHALMOLOGY EXAM  Never done   Zoster Vaccines- Shingrix (1 of 2) Never done    There are no preventive care reminders to display for this patient.   Lab Results  Component Value Date   TSH 0.893 08/16/2020   Lab Results  Component Value Date   WBC 7.7 06/19/2021   HGB 14.7 06/19/2021   HCT 43.1 06/19/2021   MCV 97.3 06/19/2021   PLT 133 (L) 06/19/2021   Lab Results  Component Value Date   NA 138 06/19/2021   K 4.4 06/19/2021   CO2 26 06/19/2021   GLUCOSE 140 (H) 06/19/2021   BUN 20 06/19/2021   CREATININE 0.80 06/19/2021   BILITOT 0.9 06/19/2021   ALKPHOS 21 (L) 06/19/2021   AST 22 06/19/2021   ALT 31 06/19/2021   PROT 6.6 06/19/2021   ALBUMIN 4.0 06/19/2021   CALCIUM 9.0 06/19/2021   ANIONGAP 7 06/19/2021   Lab Results  Component Value Date   CHOL 94 (L) 01/22/2020   Lab Results  Component Value Date   HDL 33 (L) 01/22/2020   Lab Results  Component Value Date   LDLCALC 45 01/22/2020   Lab Results  Component Value Date   TRIG 73 01/22/2020   Lab Results  Component Value Date   CHOLHDL 2.8 01/22/2020   Lab Results  Component Value Date   HGBA1C 6.3 04/29/2021       Assessment & Plan:   Greysen was seen today for feet itching.  Diagnoses and all orders for this visit:  Tinea pedis of both feet Lotrisone as below. Handout given. Return to office for new or worsening  symptoms, or if symptoms persist.  -     clotrimazole-betamethasone (LOTRISONE) cream; Apply 1 application topically daily.  The patient indicates understanding of these issues and agrees with the plan.  Gwenlyn Perking, FNP

## 2021-09-06 ENCOUNTER — Other Ambulatory Visit: Payer: Self-pay | Admitting: Family Medicine

## 2021-09-06 ENCOUNTER — Telehealth: Payer: Self-pay | Admitting: Family Medicine

## 2021-09-06 DIAGNOSIS — E65 Localized adiposity: Secondary | ICD-10-CM

## 2021-09-06 DIAGNOSIS — L304 Erythema intertrigo: Secondary | ICD-10-CM

## 2021-09-06 MED ORDER — NYSTATIN 100000 UNIT/GM EX POWD: 1.0000 "application " | Freq: Three times a day (TID) | CUTANEOUS | 3 refills | Status: DC

## 2021-09-06 NOTE — Telephone Encounter (Signed)
°  Prescription Request  09/06/2021  Is this a "Controlled Substance" medicine? NO  Have you seen your PCP in the last 2 weeks? NO  If YES, route message to pool  -  If NO, patient needs to be scheduled for appointment.  What is the name of the medication or equipment? 30 Nystop 10000 Unit/GM Top Pow GM  Have you contacted your pharmacy to request a refill? NO   Which pharmacy would you like [facility]   Patient notified that their request is being sent to the clinical staff for review and that they should receive a response within 2 business days.

## 2021-09-09 ENCOUNTER — Ambulatory Visit (INDEPENDENT_AMBULATORY_CARE_PROVIDER_SITE_OTHER): Payer: Self-pay | Admitting: Family Medicine

## 2021-09-09 ENCOUNTER — Other Ambulatory Visit: Payer: Self-pay

## 2021-09-09 VITALS — BP 116/69 | HR 53 | Temp 98.2°F | Ht 65.0 in | Wt 298.0 lb

## 2021-09-09 DIAGNOSIS — M7661 Achilles tendinitis, right leg: Secondary | ICD-10-CM

## 2021-09-09 MED ORDER — DICLOFENAC SODIUM 75 MG PO TBEC: 75.0000 mg | DELAYED_RELEASE_TABLET | Freq: Two times a day (BID) | ORAL | 2 refills | Status: DC

## 2021-09-09 MED ORDER — SPENCO GEL HEEL CUP MED/LG MISC: 0 refills | Status: DC

## 2021-09-09 NOTE — Progress Notes (Signed)
Chief Complaint  Patient presents with   Leg Pain    HPI  Patient presents today for  Chief Complaint  Patient presents with   Leg Pain    HPI  Patient presents today for  Chief Complaint  Patient presents with   Leg Pain    HPI  Patient presents today for pain at the right posterior leg behind the ankle extending upward into the calf. Onset 1-2 weeks ago increasing in severity.   PMH: Smoking status noted ROS: Per HPI  Objective: BP 116/69    Pulse (!) 53    Temp 98.2 F (36.8 C) (Temporal)    Ht 5\' 5"  (1.651 m)    Wt 298 lb (135.2 kg)    SpO2 94%    BMI 49.59 kg/m  Gen: NAD, alert, cooperative with exam HEENT: NCAT, EOMI, PERRL CV: RRR, good S1/S2, no murmur Resp: CTABL, no wheezes, non-labored Ext: No edema, warm. Moderate tenderness for palpation of right achilles tendon Neuro: Alert and oriented, No gross deficits  Assessment and plan:  1. Tendonitis, Achilles, right     Meds ordered this encounter  Medications   Foot Care Products [facility] GEL HEEL CUP MED/LG) MISC    Sig: Wear daily until pain completely resolves    Dispense:  1 each    Refill:  0   diclofenac (VOLTAREN) 75 MG EC tablet    Sig: Take 1 tablet (75 mg total) by mouth 2 (two) times daily. For muscle and  Joint pain    Dispense:  60 tablet    Refill:  2    No orders of the defined types were placed in this encounter.   Follow up as needed.  Claretta Fraise, MD       PMH: Smoking status noted ROS: Per HPI  Objective: BP 116/69    Pulse (!) 53    Temp 98.2 F (36.8 C) (Temporal)    Ht 5\' 5"  (1.651 m)    Wt 298 lb (135.2 kg)    SpO2 94%    BMI 49.59 kg/m  Gen: NAD, alert, cooperative with exam HEENT: NCAT, EOMI, PERRL CV: RRR, good S1/S2, no murmur Resp: CTABL, no wheezes, non-labored Abd: SNTND, BS present, no guarding or organomegaly Ext: No edema, warm Neuro: Alert and oriented, No gross deficits  Assessment and plan:  1. Tendonitis, Achilles, right     Meds  ordered this encounter  Medications   Foot Care Products [facility] GEL HEEL CUP MED/LG) MISC    Sig: Wear daily until pain completely resolves    Dispense:  1 each    Refill:  0   diclofenac (VOLTAREN) 75 MG EC tablet    Sig: Take 1 tablet (75 mg total) by mouth 2 (two) times daily. For muscle and  Joint pain    Dispense:  60 tablet    Refill:  2    No orders of the defined types were placed in this encounter.   Follow up as needed.  Claretta Fraise, MD     Chief Complaint  Patient presents with   Leg Pain    HPI  Patient presents today for right leg pain behind ankle, and at calf. Points to achilles.   PMH: Smoking status noted ROS: Per HPI  Objective: BP 116/69    Pulse (!) 53    Temp 98.2 F (36.8 C) (Temporal)    Ht 5\' 5"  (1.651 m)    Wt 298 lb (135.2 kg)    SpO2 94%  BMI 49.59 kg/m  Gen: NAD, alert, cooperative with exam HEENT: NCAT, EOMI, PERRL CV: RRR, good S1/S2, no murmur Resp: CTABL, no wheezes, non-labored Abd: SNTND, BS present, no guarding or organomegaly Ext: No edema, warm Neuro: Alert and oriented, No gross deficits  Assessment and plan:  1. Tendonitis, Achilles, right     Meds ordered this encounter  Medications   Foot Care Products [facility] GEL HEEL CUP MED/LG) MISC    Sig: Wear daily until pain completely resolves    Dispense:  1 each    Refill:  0   diclofenac (VOLTAREN) 75 MG EC tablet    Sig: Take 1 tablet (75 mg total) by mouth 2 (two) times daily. For muscle and  Joint pain    Dispense:  60 tablet    Refill:  2    No orders of the defined types were placed in this encounter.   Follow up as needed.  Claretta Fraise, MD       PMH: Smoking status noted ROS: Per HPI  Objective: BP 116/69    Pulse (!) 53    Temp 98.2 F (36.8 C) (Temporal)    Ht 5\' 5"  (1.651 m)    Wt 298 lb (135.2 kg)    SpO2 94%    BMI 49.59 kg/m  Gen: NAD, alert, cooperative with exam HEENT: NCAT, EOMI, PERRL CV: RRR, good S1/S2, no murmur Resp:  CTABL, no wheezes, non-labored Abd: SNTND, BS present, no guarding or organomegaly Ext: No edema, warm Neuro: Alert and oriented, No gross deficits  Assessment and plan:  1. Tendonitis, Achilles, right     Meds ordered this encounter  Medications   Foot Care Products [facility] GEL HEEL CUP MED/LG) MISC    Sig: Wear daily until pain completely resolves    Dispense:  1 each    Refill:  0   diclofenac (VOLTAREN) 75 MG EC tablet    Sig: Take 1 tablet (75 mg total) by mouth 2 (two) times daily. For muscle and  Joint pain    Dispense:  60 tablet    Refill:  2    No orders of the defined types were placed in this encounter.   Follow up as needed.  Claretta Fraise, MD

## 2021-09-11 ENCOUNTER — Encounter: Payer: Self-pay | Admitting: Family Medicine

## 2021-09-12 NOTE — Telephone Encounter (Signed)
Dr. Halford Chessman signed form. Called patient and informed him. Faxed over to [employer] and copy mailed to patient. Per pt request.

## 2021-09-14 ENCOUNTER — Telehealth: Payer: Self-pay | Admitting: Family Medicine

## 2021-09-14 ENCOUNTER — Telehealth: Payer: Self-pay | Admitting: Internal Medicine

## 2021-09-14 NOTE — Telephone Encounter (Signed)
Pt called statin that he bought a Clear Nicotine Transdermal System Patch that his pharmacist recommended but wants to make sure Dr Lajuana Ripple thinks its ok for him to use with him being diabetic.  Please advise and call patient.

## 2021-09-14 NOTE — Telephone Encounter (Signed)
Recall mail

## 2021-09-14 NOTE — Telephone Encounter (Signed)
That is fine. I hope he is able to stop smoking. He cannot smoke while wearing these patches.

## 2021-09-14 NOTE — Telephone Encounter (Signed)
Covering PCP- please advise  

## 2021-09-14 NOTE — Telephone Encounter (Signed)
RECALL FOR ULTRASOUND 

## 2021-09-15 NOTE — Telephone Encounter (Signed)
Patient aware.

## 2021-09-20 ENCOUNTER — Ambulatory Visit: Payer: Self-pay | Admitting: Family Medicine

## 2021-09-29 ENCOUNTER — Telehealth: Payer: Self-pay | Admitting: Family Medicine

## 2021-09-29 NOTE — Telephone Encounter (Signed)
Pt aware.

## 2021-09-29 NOTE — Telephone Encounter (Signed)
Yes, continue the metformin as prescribed.  It won't "bring his sugar down", it improves his body's ability to manage his sugars.  So in other words, it's working as it should.

## 2021-10-05 ENCOUNTER — Telehealth: Payer: Self-pay | Admitting: Internal Medicine

## 2021-10-05 DIAGNOSIS — K7469 Other cirrhosis of liver: Secondary | ICD-10-CM

## 2021-10-05 NOTE — Telephone Encounter (Signed)
Pt received letter that it was time to schedule his abdominal ultrasound, please call [phone]

## 2021-10-05 NOTE — Telephone Encounter (Signed)
[place] scheduled for 1/16 at 8:30am, arrival 8:00am, npo midnight  Called pt, LMOVM to call back

## 2021-10-06 NOTE — Telephone Encounter (Signed)
Pt called back and is aware of appt details 

## 2021-10-10 ENCOUNTER — Ambulatory Visit (HOSPITAL_COMMUNITY): Payer: Self-pay

## 2021-10-12 ENCOUNTER — Other Ambulatory Visit: Payer: Self-pay

## 2021-10-12 ENCOUNTER — Ambulatory Visit (HOSPITAL_COMMUNITY)
Admission: RE | Admit: 2021-10-12 | Discharge: 2021-10-12 | Disposition: A | Discharge status: Home / Self Care | Payer: Self-pay | Source: Ambulatory Visit | Attending: Internal Medicine | Admitting: Internal Medicine

## 2021-10-12 DIAGNOSIS — K7469 Other cirrhosis of liver: Secondary | ICD-10-CM | POA: Insufficient documentation

## 2021-10-17 ENCOUNTER — Encounter: Payer: Self-pay | Admitting: Pulmonary Disease

## 2021-10-17 ENCOUNTER — Ambulatory Visit (INDEPENDENT_AMBULATORY_CARE_PROVIDER_SITE_OTHER): Payer: Self-pay | Admitting: Pulmonary Disease

## 2021-10-17 ENCOUNTER — Ambulatory Visit (HOSPITAL_COMMUNITY)
Admission: RE | Admit: 2021-10-17 | Discharge: 2021-10-17 | Disposition: A | Discharge status: Home / Self Care | Payer: Self-pay | Source: Ambulatory Visit | Attending: Pulmonary Disease | Admitting: Pulmonary Disease

## 2021-10-17 ENCOUNTER — Other Ambulatory Visit: Payer: Self-pay

## 2021-10-17 VITALS — BP 136/78 | HR 68 | Temp 98.4°F | Ht 67.0 in | Wt 299.2 lb

## 2021-10-17 DIAGNOSIS — R0609 Other forms of dyspnea: Secondary | ICD-10-CM | POA: Insufficient documentation

## 2021-10-17 DIAGNOSIS — J411 Mucopurulent chronic bronchitis: Secondary | ICD-10-CM

## 2021-10-17 DIAGNOSIS — Z6841 Body Mass Index (BMI) 40.0 and over, adult: Secondary | ICD-10-CM

## 2021-10-17 DIAGNOSIS — Z720 Tobacco use: Secondary | ICD-10-CM

## 2021-10-17 NOTE — Patient Instructions (Signed)
Will arrange for chest xray and pulmonary function test and call with results  Follow up in 6 months

## 2021-10-17 NOTE — Progress Notes (Signed)
[facility]  Chief Complaint  Patient presents with   Follow-up    Cpap working well. Got a new mask recently and has improved. Chest tightness and soreness     Constitutional:  BP 136/78 (BP Location: Left Arm, Patient Position: Sitting)    Pulse 68    Temp 98.4 F (36.9 C)    Ht 5\' 7"  (1.702 m)    Wt 299 lb 3.2 oz (135.7 kg)    SpO2 96% Comment: ra   BMI 46.86 kg/m   Past Medical History:  SVT, Gallstone pancreatitis, HTN, HLD, Hep C, GERD  Past Surgical History:  His  has a past surgical history that includes JAW BROKEN; Cholecystectomy; LIPOMA REMOVALS; Colonoscopy (10/04/2012); Ablation (06-22-14); supraventricular tachycardia ablation (N/A, 06/22/2014); Esophagogastroduodenoscopy (N/A, 09/04/2014); Gallbladder surgery; and RIGHT/LEFT HEART CATH AND CORONARY ANGIOGRAPHY (N/A, 10/08/2018).  Brief Summary:  Thomas Ball is a 61 y.o. male smoker with OSA and dyspnea.      Subjective:   His wife passed away from lung cancer last year.  While caring for her he wasn't able to be active.  His weight ballooned up to 320 lbs.    He gets winded while walking for a while or doing more strenuous activity.  He gets discomfort in his chest intermittently.  He feels this sometimes when he takes a deep breath.  He has cough with clear sputum.  Still gets sinus congestion sometimes.  Uses CPAP for a few hours per night.  He looked into insurance options, but all were too expensive.  Physical Exam:   Appearance - well kempt   ENMT - no sinus tenderness, no oral exudate, no LAN, Mallampati 4 airway, no stridor  Respiratory - equal breath sounds bilaterally, no wheezing or rales  CV - s1s2 regular rate and rhythm, no murmurs  Ext - no clubbing, no edema  Skin - no rashes  Psych - normal mood and affect     Pulmonary testing:  PFT 12/02/18 >> FEV1 2.78 (92%), FEV1% 79, TLC 4.83 (83%), DLCO 97%  Chest Imaging:  CT angio chest 04/28/17  >> calcified granuloma LUL  Sleep Tests:  PSG 08/01/17 >> AHI 40.4, SpO2 low 74% Auto CPAP 05/05/20 to 06/03/20 >> used on 21 of 30 nights with average 1 hrs 21 min.  Average AHI 39.3 with median CPAP 11 and 95 th percentile CPAP 13 cm H2O  Cardiac Tests:  Echo 09/16/18 >> EF 60 to 65%, mild LVH, grade 1 DD RHC/LHC 10/08/18 >> no CAD, mild pulmonary HTN  Social History:  He  reports that he has been smoking cigarettes. He has a 35.00 pack-year smoking history. He has never used smokeless tobacco. He reports that he does not drink alcohol and does not use drugs.  Family History:  His family history includes COPD in his mother; Diabetes in his father; Early death in his mother; Emphysema in his mother; High Cholesterol in his father; Hyperlipidemia in his father; Stroke in his father.     Assessment/Plan:   Obstructive sleep apnea. - insufficient financial resources are significant restraint for optimal CPAP therapy - he will continue to purchase supplies on his own  - continue auto CPAP   Dyspnea on exertion. - will repeat chest xray and PFT - he will check with his PCP and cardiologist - likely related to obesity and deconditioning   Tobacco abuse. - he is working to gradually quit  Chronic bronchitis. - symbicort 80 two puffs  bid - prn albuterol - prn mucinex  Post nasal drip with deviated septum. - nasal irrigation, flonase, astepro  Lack of health insurance. - this continues to be an issue with providing his health care  Time Spent Involved in Patient Care on Day of Examination:  37 minutes  Follow up:   Patient Instructions  Will arrange for chest xray and pulmonary function test and call with results  Follow up in 6 months  Medication List:   Allergies as of 10/17/2021   No Known Allergies      Medication List        Accurate as of October 17, 2021 10:56 AM. If you have any questions, ask your nurse or doctor.          albuterol 108 (90 Base)  MCG/ACT inhaler Commonly known as: VENTOLIN HFA Inhale 2 puffs into the lungs every 6 (six) hours as needed for wheezing or shortness of breath.   albuterol (2.5 MG/3ML) 0.083% nebulizer solution Commonly known as: PROVENTIL Take 3 mLs (2.5 mg total) by nebulization every 6 (six) hours as needed for wheezing or shortness of breath.   atorvastatin 40 MG tablet Commonly known as: LIPITOR TAKE 1 TABLET DAILY AT 6PM   Azelastine HCl 0.15 % Soln Commonly known as: AstePro Childrens Place 1 spray into the nose in the morning and at bedtime.   budesonide-formoterol 80-4.5 MCG/ACT inhaler Commonly known as: SYMBICORT Inhale 2 puffs into the lungs 2 (two) times daily.   clotrimazole-betamethasone cream Commonly known as: LOTRISONE Apply 1 application topically daily.   diclofenac 75 MG EC tablet Commonly known as: VOLTAREN Take 1 tablet (75 mg total) by mouth 2 (two) times daily. For muscle and  Joint pain   doxazosin 1 MG tablet Commonly known as: Cardura Take 1 tablet (1 mg total) by mouth daily.   fluticasone 50 MCG/ACT nasal spray Commonly known as: FLONASE Place 2 sprays into both nostrils daily.   hydrOXYzine 10 MG tablet Commonly known as: ATARAX Take 1 tablet (10 mg total) by mouth 3 (three) times daily as needed.   metFORMIN 500 MG tablet Commonly known as: GLUCOPHAGE Take 1 tablet (500 mg total) by mouth 2 (two) times daily with a meal.   nystatin powder Commonly known as: MYCOSTATIN/NYSTOP Apply 1 application topically 3 (three) times daily. x14 days per flare   omeprazole 40 MG capsule Commonly known as: PRILOSEC TAKE ONE (1) CAPSULE EACH DAY   [place] Gel Heel Cup Med/Lg Misc Wear daily until pain completely resolves        Signature:  Chesley Mires, MD [place] Pager - [phone] 10/17/2021, 10:56 AM

## 2021-10-24 ENCOUNTER — Encounter: Payer: Self-pay | Admitting: Family Medicine

## 2021-10-24 ENCOUNTER — Ambulatory Visit (INDEPENDENT_AMBULATORY_CARE_PROVIDER_SITE_OTHER): Payer: Self-pay | Admitting: Family Medicine

## 2021-10-24 VITALS — BP 114/68 | HR 55 | Temp 97.9°F | Ht 67.0 in | Wt 295.8 lb

## 2021-10-24 DIAGNOSIS — M7661 Achilles tendinitis, right leg: Secondary | ICD-10-CM

## 2021-10-24 DIAGNOSIS — R0789 Other chest pain: Secondary | ICD-10-CM

## 2021-10-24 DIAGNOSIS — E1169 Type 2 diabetes mellitus with other specified complication: Secondary | ICD-10-CM

## 2021-10-24 DIAGNOSIS — M7989 Other specified soft tissue disorders: Secondary | ICD-10-CM

## 2021-10-24 DIAGNOSIS — F17200 Nicotine dependence, unspecified, uncomplicated: Secondary | ICD-10-CM

## 2021-10-24 LAB — BAYER DCA HB A1C WAIVED: HB A1C (BAYER DCA - WAIVED): 6.1 % — ABNORMAL HIGH (ref 4.8–5.6)

## 2021-10-24 NOTE — Progress Notes (Signed)
Subjective: BR:AXEN PCP: [name], DO [facility] Thomas Ball is a 61 y.o. male presenting to clinic today for:  1. Cyst Patient is actually referring to the soft tissue mass noted in the left groin several months ago.  He was referred to specialty for this but notes that he was not able to make an appointment due to various illness etc.  He does feel that the lesion is getting bigger.  Denies any drainage but does feel fluid-filled.  2.  Heel pain Patient with ongoing ankle pain.  Describes it as a burning pain and points to the Achilles as the source of pain.  He thinks that his morbid obesity contributes and he admits that he has not really done a great job with diet and exercise of late but is planning on starting a new regimen soon.  3.  Chest pain Patient reports that he has been experiencing intermittent chest pain that is not necessarily associated with activity.  This is been ongoing for several weeks now and has been seen in the ER and by his cardiologist in the past for this but told that he did not have any blockages to explain.  He has an appointment on March 2 but asked if we can get an EKG today.  He admits to ongoing tobacco use.  He is compliant with his medications however.   ROS: Per HPI  No Known Allergies Past Medical History:  Diagnosis Date   GERD (gastroesophageal reflux disease)    HCV (hepatitis C virus) DR. ZACKS   AUG 2013 VIRAL LOAD UNDETECTABLE   History of cardiac catheterization    a. 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   Sleep apnea    SVT (supraventricular tachycardia) (HCC)     Current Outpatient Medications:    albuterol (PROVENTIL) (2.5 MG/3ML) 0.083% nebulizer solution, Take 3 mLs (2.5 mg total) by nebulization every 6 (six) hours as needed for wheezing or shortness of breath., Disp: 150 mL, Rfl: 1    albuterol (VENTOLIN HFA) 108 (90 Base) MCG/ACT inhaler, Inhale 2 puffs into the lungs every 6 (six) hours as needed for wheezing or shortness of breath., Disp: 8 g, Rfl: 2   atorvastatin (LIPITOR) 40 MG tablet, TAKE 1 TABLET DAILY AT 6PM, Disp: 90 tablet, Rfl: 3   Azelastine HCl (ASTEPRO CHILDRENS) 0.15 % SOLN, Place 1 spray into the nose in the morning and at bedtime., Disp: , Rfl:    budesonide-formoterol (SYMBICORT) 80-4.5 MCG/ACT inhaler, Inhale 2 puffs into the lungs 2 (two) times daily., Disp: 1 each, Rfl: 3   clotrimazole-betamethasone (LOTRISONE) cream, Apply 1 application topically daily., Disp: 45 g, Rfl: 0   diclofenac (VOLTAREN) 75 MG EC tablet, Take 1 tablet (75 mg total) by mouth 2 (two) times daily. For muscle and  Joint pain, Disp: 60 tablet, Rfl: 2   doxazosin (CARDURA) 1 MG tablet, Take 1 tablet (1 mg total) by mouth daily., Disp: 90 tablet, Rfl: 1   fluticasone (FLONASE) 50 MCG/ACT nasal spray, Place 2 sprays into both nostrils daily., Disp: 16 g, Rfl: 6   Foot Care Products (SPENCO GEL HEEL CUP MED/LG) MISC, Wear daily until pain completely resolves, Disp: 1 each, Rfl: 0   hydrOXYzine (ATARAX/VISTARIL) 10 MG tablet, Take 1 tablet (10 mg total) by mouth 3 (three) times daily as needed., Disp:  30 tablet, Rfl: 0   metFORMIN (GLUCOPHAGE) 500 MG tablet, Take 1 tablet (500 mg total) by mouth 2 (two) times daily with a meal., Disp: 180 tablet, Rfl: 3   nystatin (MYCOSTATIN/NYSTOP) powder, Apply 1 application topically 3 (three) times daily. x14 days per flare, Disp: 45 g, Rfl: 3   omeprazole (PRILOSEC) 40 MG capsule, TAKE ONE (1) CAPSULE EACH DAY, Disp: 90 capsule, Rfl: 0 Social History   Socioeconomic History   Marital status: Widowed    Spouse name: Not on file   Number of children: Not on file   Years of education: Not on file   Highest education level: Not on file  Occupational History   Occupation: concrete work    Fish farm manager: SELF-EMPLOYED  Tobacco Use   Smoking status:  Every Day    Packs/day: 1.00    Years: 35.00    Pack years: 35.00    Types: Cigarettes   Smokeless tobacco: Never   Tobacco comments:    smokes 1-1.5 packs per day  Vaping Use   Vaping Use: Never used  Substance and Sexual Activity   Alcohol use: No   Drug use: No    Comment: in the 1980s, smoke crack. None since Dec 2011.    Sexual activity: Not Currently  Other Topics Concern   Not on file  Social History Narrative   Not on file   Social Determinants of Health   Financial Resource Strain: Not on file  Food Insecurity: Not on file  Transportation Needs: Not on file  Physical Activity: Not on file  Stress: Not on file  Social Connections: Not on file  Intimate Partner Violence: Not on file   Family History  Problem Relation Age of Onset   Emphysema Mother    COPD Mother    Early death Mother    Hyperlipidemia Father    Stroke Father    Diabetes Father    High Cholesterol Father    Colon cancer Neg Hx     Objective: Office vital signs reviewed. BP 114/68    Pulse (!) 55    Temp 97.9 F (36.6 C)    Ht '5\' 7"'  (1.702 m)    Wt 295 lb 12.8 oz (134.2 kg)    SpO2 96%    BMI 46.33 kg/m   Physical Examination:  General: Awake, alert, morbidly obese, No acute distress HEENT: Sclera white Cardio: regular rate and rhythm, S1S2 heard, no murmurs appreciated Pulm: clear to auscultation bilaterally, no wheezes, rhonchi or rales; normal work of breathing on room air Extremities: warm, well perfused, No edema, cyanosis or clubbing; +2 pulses bilaterally MSK: Wide-based gait and station  Right heel: No appreciable deformities.  He does have mild tenderness to palpation over the Achilles tendon. Groin: Soft tissue mass that appears to be well-circumscribed noted in the left groin area  Assessment/ Plan: 61 y.o. male   Controlled type 2 diabetes mellitus with other specified complication, without long-term current use of insulin ([place]) - Plan: Bayer DCA Hb A1c Waived, Lipid  Panel, CMP14+EGFR  Morbid obesity ([place]) - Plan: Lipid Panel, CMP14+EGFR, TSH  Atypical chest pain - Plan: EKG 12-Lead, Lipid Panel, CMP14+EGFR, TSH  Tobacco use disorder  Soft tissue mass - Plan: Ambulatory referral to Dermatology  Tendonitis, Achilles, right - Plan: Ambulatory referral to Sports Medicine  Check A1c and fasting labs  We discussed at length need for lifestyle modification including increased physical exercise, improvement in diet.  We discussed consideration for GLP.  Patient does  have a gallstone induced pancreatitis x2 in the past before he had his gallbladder removed  EKG looks relatively unchanged from previous EKG.  I have CCed his cardiologist as FYI and for review of the EKG.  Again we discussed multiple risk factors for cardiovascular disease  I suspect this is likely a lipoma in his left groin.  Referred him to dermatology for surgical evaluation of this  Right heel pain is consistent with Achilles tendinitis.  Referral to sports medicine placed.  Weight loss highly recommended.  May use ice, topical analgesic of choice.    No orders of the defined types were placed in this encounter.  No orders of the defined types were placed in this encounter.    [name], DO [place] [phone]

## 2021-10-24 NOTE — Patient Instructions (Addendum)
We talked about Rybelsus (pill) and Ozempic (injection) for diabetes and weight loss. See Thomas Ball if you are interested in either so she can help with financial aspect of them  I think you have achilles tendinitis on the right. I have placed a referral to the sports medicine doctor to help with this.  Weight loss will help  Referral to Dermatology for the groin mass placed as well.  Achilles Tendinitis Achilles tendinitis is inflammation of the tough, cord-like band that attaches the lower leg muscles to the heel bone (Achilles tendon). This is usually caused by overusing the tendon and the ankle joint. Achilles tendinitis usually gets better over time with treatment and caring for yourself at home. It can take weeks or months to heal completely. What are the causes? This condition may be caused by: A sudden increase in exercise or activity, such as running. Doing the same exercises or activities, such as jumping, over and over. Not warming up calf muscles before exercising. Exercising in shoes that are worn out or not made for exercise. Having arthritis or a bone growth (spur) on the back of the heel bone. This can rub against the tendon and hurt it. Age-related wear and tear. Tendons become less flexible with age and are more likely to be injured. What are the signs or symptoms? Common symptoms of this condition include: Pain in the Achilles tendon or in the back of the leg, just above the heel. The pain usually gets worse with exercise. Stiffness or soreness in the back of the leg, especially in the morning. Swelling of the skin over the Achilles tendon. Thickening of the tendon. Trouble standing on tiptoe. How is this diagnosed? This condition is diagnosed based on your symptoms and a physical exam. You may have tests, including: X-rays. MRI. How is this treated? The goal of treatment is to relieve symptoms and help your injury heal. Treatment may include: Decreasing or stopping  activities that caused the tendinitis. This may mean switching to low-impact exercises like biking or swimming. Icing the injured area. Doing physical therapy, including strengthening and stretching exercises. Taking NSAIDs, such as ibuprofen, to help relieve pain and swelling. Using supportive shoes, wraps, heel lifts, or a walking boot (air cast). Having surgery. This may be done if your symptoms do not improve after other treatments. Using high-energy shock wave impulses to stimulate the healing process (extracorporeal shock wave therapy). This is rare. Having an injection of medicines that help relieve inflammation (corticosteroids). This is rare. Follow these instructions at home: If you have an air cast: Wear the air cast as told by your health care provider. Remove it only as told by your health care provider. Loosen it if your toes tingle, become numb, or turn cold and blue. Keep it clean. If the air cast is not waterproof: Do not let it get wet. Cover it with a watertight covering when you take a bath or shower. Managing pain, stiffness, and swelling  If directed, put ice on the injured area. To do this: If you have a removable air cast, remove it as told by your health care provider. Put ice in a plastic bag. Place a towel between your skin and the bag. Leave the ice on for 20 minutes, 2-3 times a day. Move your toes often to reduce stiffness and swelling. Raise (elevate) your foot above the level of your heart while you are sitting or lying down. Activity Gradually return to your normal activities as told by your health care  provider. Ask your health care provider what activities are safe for you. Do not do activities that cause pain. Consider doing low-impact exercises, like cycling or swimming. Ask your health care provider when it is safe to drive if you have an air cast on your foot. If physical therapy was prescribed, do exercises as told by your health care provider or  physical therapist. General instructions If directed, wrap your foot with an elastic bandage or other wrap. This can help to keep your tendon from moving too much while it heals. Your health care provider will show you how to wrap your foot correctly. Wear supportive shoes or heel lifts only as told by your health care provider. Take over-the-counter and prescription medicines only as told by your health care provider. Keep all follow-up visits as told by your health care provider. This is important. Contact a health care provider if you: Have symptoms that get worse. Have pain that does not get better with medicine. Develop new, unexplained symptoms. Develop warmth and swelling in your foot. Have a fever. Get help right away if you: Have a sudden popping sound or sensation in your Achilles tendon followed by severe pain. Cannot move your toes or foot. Cannot put any weight on your foot. Your foot or toes become numb and look white or blue even after loosening your bandage or air cast. Summary Achilles tendinitis is inflammation of the tough, cord-like band that attaches the lower leg muscles to the heel bone (Achilles tendon). This condition is usually caused by overusing the tendon and the ankle joint. It can also be caused by arthritis or normal aging. The most common symptoms of this condition include pain, swelling, or stiffness in the Achilles tendon or in the back of the leg. This condition is usually treated by decreasing or stopping activities that caused the tendinitis, icing the injured area, taking NSAIDs, and doing physical therapy. This information is not intended to replace advice given to you by your health care provider. Make sure you discuss any questions you have with your health care provider. Document Revised: 01/27/2019 Document Reviewed: 01/27/2019 Elsevier Patient Education  Thomas Ball.

## 2021-10-25 LAB — CMP14+EGFR
ALT: 21 IU/L (ref 0–44)
AST: 18 IU/L (ref 0–40)
Albumin/Globulin Ratio: 2.2 (ref 1.2–2.2)
Albumin: 4.4 g/dL (ref 3.8–4.9)
Alkaline Phosphatase: 25 IU/L — ABNORMAL LOW (ref 44–121)
BUN/Creatinine Ratio: 19 (ref 9–20)
BUN: 15 mg/dL (ref 6–24)
Bilirubin Total: 1 mg/dL (ref 0.0–1.2)
CO2: 24 mmol/L (ref 20–29)
Calcium: 9.1 mg/dL (ref 8.7–10.2)
Chloride: 101 mmol/L (ref 96–106)
Creatinine, Ser: 0.78 mg/dL (ref 0.76–1.27)
Globulin, Total: 2 g/dL (ref 1.5–4.5)
Glucose: 114 mg/dL — ABNORMAL HIGH (ref 70–99)
Potassium: 4.3 mmol/L (ref 3.5–5.2)
Sodium: 137 mmol/L (ref 134–144)
Total Protein: 6.4 g/dL (ref 6.0–8.5)
eGFR: 103 mL/min/{1.73_m2} (ref >59)

## 2021-10-25 LAB — LIPID PANEL
Chol/HDL Ratio: 2.8 ratio (ref 0.0–5.0)
Cholesterol, Total: 109 mg/dL (ref 100–199)
HDL: 39 mg/dL — ABNORMAL LOW (ref >39)
LDL Chol Calc (NIH): 57 mg/dL (ref 0–99)
Triglycerides: 60 mg/dL (ref 0–149)
VLDL Cholesterol Cal: 13 mg/dL (ref 5–40)

## 2021-10-25 LAB — TSH: TSH: 1.46 u[IU]/mL (ref 0.450–4.500)

## 2021-10-27 ENCOUNTER — Other Ambulatory Visit: Payer: Self-pay

## 2021-10-27 ENCOUNTER — Ambulatory Visit (INDEPENDENT_AMBULATORY_CARE_PROVIDER_SITE_OTHER): Payer: Self-pay | Admitting: Orthopaedic Surgery

## 2021-10-27 ENCOUNTER — Encounter: Payer: Self-pay | Admitting: Orthopaedic Surgery

## 2021-10-27 VITALS — BP 154/82 | HR 54 | Ht 65.0 in | Wt 297.4 lb

## 2021-10-27 DIAGNOSIS — M7751 Other enthesopathy of right foot: Secondary | ICD-10-CM

## 2021-10-27 NOTE — Progress Notes (Signed)
Subjective:    Patient ID: Thomas Ball, male    DOB: [birth year], 61 y.o.   MRN: [id number]  HPI He has had posterior heel pain for quite a while, not getting any better.  He wears boots most of the time with an occasional tennis shoe.  He has a "knot" of the distal Achilles that is the area of his pain. He has no redness, no trauma, no other ankle or foot problem.  Nothing seems to help his heel pain.  He has seen his primary care recently and I have reviewed the notes.   Review of Systems  Constitutional:  Positive for activity change.  Musculoskeletal:  Positive for arthralgias and gait problem.  All other systems reviewed and are negative. For Review of Systems, all other systems reviewed and are negative.  The following is a summary of the past history medically, past history surgically, known current medicines, social history and family history.  This information is gathered electronically by the computer from prior information and documentation.  I review this each visit and have found including this information at this point in the chart is beneficial and informative.   Past Medical History:  Diagnosis Date   GERD (gastroesophageal reflux disease)    HCV (hepatitis C virus) DR. ZACKS   AUG 2013 VIRAL LOAD UNDETECTABLE   History of cardiac catheterization    a. 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   CHOLECYSTECTOMY     COLONOSCOPY  10/04/2012   SLF:ONE/8 SIMPLE ADENOMA, TICS, SML IH   ESOPHAGOGASTRODUODENOSCOPY N/A 09/04/2014   Procedure: ESOPHAGOGASTRODUODENOSCOPY (EGD);  Surgeon: Danie Binder, MD;  Location: [facility];  Service: Endoscopy;  Laterality: N/A;  145    GALLBLADDER SURGERY     JAW BROKEN     LIPOMA REMOVALS     SEVERAL   RIGHT/LEFT HEART CATH AND CORONARY ANGIOGRAPHY N/A 10/08/2018   Procedure: RIGHT/LEFT HEART CATH AND CORONARY ANGIOGRAPHY;  Surgeon: Martinique, Peter M, MD;  Location: [facility];  Service: Cardiovascular;  Laterality: N/A;   SUPRAVENTRICULAR TACHYCARDIA ABLATION N/A 06/22/2014   Procedure: SUPRAVENTRICULAR TACHYCARDIA ABLATION;  Surgeon: Evans Lance, MD;  [facility];  Service: Cardiovascular;  Laterality: N/A;    Current Outpatient Medications on File Prior to Visit  Medication Sig Dispense Refill   albuterol (PROVENTIL) (2.5 MG/3ML) 0.083% nebulizer solution Take 3 mLs (2.5 mg total) by nebulization every 6 (six) hours as needed for wheezing or shortness of breath. 150 mL 1   albuterol (VENTOLIN HFA) 108 (90 Base) MCG/ACT inhaler Inhale 2 puffs into the lungs every 6 (six) hours as needed for wheezing or shortness of breath. 8 g 2   atorvastatin (LIPITOR) 40 MG tablet TAKE 1 TABLET DAILY AT 6PM 90 tablet 3   Azelastine HCl (ASTEPRO CHILDRENS) 0.15 % SOLN Place 1 spray into the nose in the morning and at bedtime.     budesonide-formoterol (SYMBICORT) 80-4.5 MCG/ACT inhaler Inhale 2 puffs into the lungs 2 (two) times daily. 1 each 3   clotrimazole-betamethasone (LOTRISONE) cream Apply 1 application topically daily. 45 g 0   diclofenac (VOLTAREN) 75  MG EC tablet Take 1 tablet (75 mg total) by mouth 2 (two) times daily. For muscle and  Joint pain 60 tablet 2   doxazosin (CARDURA) 1 MG tablet Take 1 tablet (1 mg total) by mouth daily. 90 tablet 1   fluticasone (FLONASE) 50 MCG/ACT nasal spray Place 2 sprays into both nostrils daily. 16 g 6   Foot Care Products [facility] GEL HEEL CUP MED/LG) MISC Wear daily until pain completely resolves 1 each 0   hydrOXYzine (ATARAX/VISTARIL) 10 MG tablet Take 1 tablet (10 mg total) by mouth 3 (three) times daily as needed. 30 tablet 0   metFORMIN (GLUCOPHAGE) 500 MG tablet Take  1 tablet (500 mg total) by mouth 2 (two) times daily with a meal. 180 tablet 3   nystatin (MYCOSTATIN/NYSTOP) powder Apply 1 application topically 3 (three) times daily. x14 days per flare 45 g 3   omeprazole (PRILOSEC) 40 MG capsule TAKE ONE (1) CAPSULE EACH DAY 90 capsule 0   No current facility-administered medications on file prior to visit.    Social History   Socioeconomic History   Marital status: Widowed    Spouse name: Not on file   Number of children: Not on file   Years of education: Not on file   Highest education level: Not on file  Occupational History   Occupation: concrete work    Fish farm manager: SELF-EMPLOYED  Tobacco Use   Smoking status: Every Day    Packs/day: 1.00    Years: 35.00    Pack years: 35.00    Types: Cigarettes   Smokeless tobacco: Never   Tobacco comments:    smokes 1-1.5 packs per day  Vaping Use   Vaping Use: Never used  Substance and Sexual Activity   Alcohol use: No   Drug use: No    Comment: in the 1980s, smoke crack. None since Dec 2011.    Sexual activity: Not Currently  Other Topics Concern   Not on file  Social History Narrative   Not on file   Social Determinants of Health   Financial Resource Strain: Not on file  Food Insecurity: Not on file  Transportation Needs: Not on file  Physical Activity: Not on file  Stress: Not on file  Social Connections: Not on file  Intimate Partner Violence: Not on file    Family History  Problem Relation Age of Onset   Emphysema Mother    COPD Mother    Early death Mother    Hyperlipidemia Father    Stroke Father    Diabetes Father    High Cholesterol Father    Colon cancer Neg Hx     BP (!) 154/82    Pulse (!) 54    Ht 5\' 5"  (1.651 m)    Wt 297 lb 6.4 oz (134.9 kg)    BMI 49.49 kg/m   Body mass index is 49.49 kg/m.     Objective:   Physical Exam Vitals and nursing note reviewed. Exam conducted with a chaperone present.  Constitutional:      Appearance: He is well-developed.   HENT:     Head: Normocephalic and atraumatic.  Eyes:     Conjunctiva/sclera: Conjunctivae normal.     Pupils: Pupils are equal, round, and reactive to light.  Cardiovascular:     Rate and Rhythm: Normal rate and regular rhythm.  Pulmonary:     Effort: Pulmonary effort is normal.  Abdominal:     Palpations: Abdomen is soft.  Musculoskeletal:  Cervical back: Normal range of motion and neck supple.       Legs:  Skin:    General: Skin is warm and dry.  Neurological:     Mental Status: He is alert and oriented to person, place, and time.     Cranial Nerves: No cranial nerve deficit.     Motor: No abnormal muscle tone.     Coordination: Coordination normal.     Deep Tendon Reflexes: Reflexes are normal and symmetric. Reflexes normal.  Psychiatric:        Behavior: Behavior normal.        Thought Content: Thought content normal.        Judgment: Judgment normal.          Assessment & Plan:   Encounter Diagnosis  Name Primary?   Calcaneal bursitis (heel), right Yes   I have explained that shoe wear caused this, most likely his tennis shoes.  His boots have no rough area in relation to the lesion.  I have told him it will take time for this to resolve.  Check his tennis shoes and see if there is a small protrusion causing this issue.  It usually is.  I have told him to use Aspercreme, Biofreeze or voltaren gel to the area.  Use heat as needed. This will take a while to resolve., months.  Return as needed.  Call if any problem.  Precautions discussed.  Electronically Signed Sanjuana Kava, MD 2/2/202310:33 AM

## 2021-11-15 ENCOUNTER — Other Ambulatory Visit: Payer: Self-pay

## 2021-11-15 ENCOUNTER — Ambulatory Visit (INDEPENDENT_AMBULATORY_CARE_PROVIDER_SITE_OTHER): Payer: Self-pay | Admitting: Pulmonary Disease

## 2021-11-15 DIAGNOSIS — R0609 Other forms of dyspnea: Secondary | ICD-10-CM

## 2021-11-15 LAB — PULMONARY FUNCTION TEST
DL/VA % pred: 95 %
DL/VA: 4.18 ml/min/mmHg/L
DLCO cor % pred: 99 %
DLCO cor: 22.79 ml/min/mmHg
DLCO unc % pred: 99 %
DLCO unc: 22.79 ml/min/mmHg
FEF 25-75 Post: 1.96 L/sec
FEF 25-75 Pre: 1.49 L/sec
FEF2575-%Change-Post: 31 %
FEF2575-%Pred-Post: 78 %
FEF2575-%Pred-Pre: 60 %
FEV1-%Change-Post: 7 %
FEV1-%Pred-Post: 84 %
FEV1-%Pred-Pre: 78 %
FEV1-Post: 2.45 L
FEV1-Pre: 2.28 L
FEV1FVC-%Change-Post: 7 %
FEV1FVC-%Pred-Pre: 94 %
FEV6-%Change-Post: -1 %
FEV6-%Pred-Post: 86 %
FEV6-%Pred-Pre: 87 %
FEV6-Post: 3.14 L
FEV6-Pre: 3.2 L
FEV6FVC-%Pred-Post: 105 %
FEV6FVC-%Pred-Pre: 105 %
FVC-%Change-Post: 0 %
FVC-%Pred-Post: 83 %
FVC-%Pred-Pre: 83 %
FVC-Post: 3.2 L
FVC-Pre: 3.2 L
Post FEV1/FVC ratio: 77 %
Post FEV6/FVC ratio: 100 %
Pre FEV1/FVC ratio: 71 %
Pre FEV6/FVC Ratio: 100 %
RV % pred: 114 %
RV: 2.18 L
TLC % pred: 97 %
TLC: 5.66 L

## 2021-11-15 NOTE — Progress Notes (Signed)
PFT done today. 

## 2021-11-16 ENCOUNTER — Encounter: Payer: Self-pay | Admitting: Family Medicine

## 2021-11-16 ENCOUNTER — Other Ambulatory Visit: Payer: Self-pay | Admitting: Family Medicine

## 2021-11-16 ENCOUNTER — Ambulatory Visit (INDEPENDENT_AMBULATORY_CARE_PROVIDER_SITE_OTHER): Payer: Self-pay | Admitting: Family Medicine

## 2021-11-16 VITALS — BP 113/77 | HR 59 | Temp 97.1°F | Ht 65.0 in | Wt 296.2 lb

## 2021-11-16 DIAGNOSIS — B86 Scabies: Secondary | ICD-10-CM

## 2021-11-16 MED ORDER — PERMETHRIN 5 % EX CREA: TOPICAL_CREAM | CUTANEOUS | 0 refills | Status: DC

## 2021-11-16 NOTE — Progress Notes (Signed)
Assessment & Plan:  1. Scabies - permethrin (ELIMITE) 5 % cream; Apply all over at bedtime. Leave on for 8-14 hours. Wash off in the morning. May repeat in 1-2 weeks if symptoms persist.  Dispense: 60 g; Refill: 0   Follow up plan: Return if symptoms worsen or fail to improve.  Hendricks Limes, MSN, APRN, FNP-C [facility]  Subjective:   Patient ID: Thomas Ball, male    DOB: Feb 09, 1961, 61 y.o.   MRN: [id number]  HPI: ELRIDGE Ball is a 61 y.o. male presenting on 11/16/2021 for Pruritis (Bilateral feet itching that has been going on a few weeks. )  Patient reports his feet have been itching for a few weeks. No new soaps, detergents, etc. He has bought new socks and has been wearing them before washing them. He is unsure if itching was present prior to the new socks. He has dogs but states they went to the vet recently and they do not have fleas.   ROS: Negative unless specifically indicated above in HPI.   Relevant past medical history reviewed and updated as indicated.   Allergies and medications reviewed and updated.   Current Outpatient Medications:    albuterol (PROVENTIL) (2.5 MG/3ML) 0.083% nebulizer solution, Take 3 mLs (2.5 mg total) by nebulization every 6 (six) hours as needed for wheezing or shortness of breath., Disp: 150 mL, Rfl: 1   albuterol (VENTOLIN HFA) 108 (90 Base) MCG/ACT inhaler, Inhale 2 puffs into the lungs every 6 (six) hours as needed for wheezing or shortness of breath., Disp: 8 g, Rfl: 2   atorvastatin (LIPITOR) 40 MG tablet, TAKE 1 TABLET DAILY AT 6PM, Disp: 90 tablet, Rfl: 3   Azelastine HCl (ASTEPRO CHILDRENS) 0.15 % SOLN, Place 1 spray into the nose in the morning and at bedtime., Disp: , Rfl:    budesonide-formoterol (SYMBICORT) 80-4.5 MCG/ACT inhaler, Inhale 2 puffs into the lungs 2 (two) times daily., Disp: 1 each, Rfl: 3   clotrimazole-betamethasone (LOTRISONE) cream, Apply 1 application topically daily., Disp: 45 g,  Rfl: 0   diclofenac (VOLTAREN) 75 MG EC tablet, Take 1 tablet (75 mg total) by mouth 2 (two) times daily. For muscle and  Joint pain, Disp: 60 tablet, Rfl: 2   doxazosin (CARDURA) 1 MG tablet, Take 1 tablet (1 mg total) by mouth daily., Disp: 90 tablet, Rfl: 1   fluticasone (FLONASE) 50 MCG/ACT nasal spray, Place 2 sprays into both nostrils daily., Disp: 16 g, Rfl: 6   Foot Care Products (SPENCO GEL HEEL CUP MED/LG) MISC, Wear daily until pain completely resolves, Disp: 1 each, Rfl: 0   hydrOXYzine (ATARAX/VISTARIL) 10 MG tablet, Take 1 tablet (10 mg total) by mouth 3 (three) times daily as needed., Disp: 30 tablet, Rfl: 0   metFORMIN (GLUCOPHAGE) 500 MG tablet, Take 1 tablet (500 mg total) by mouth 2 (two) times daily with a meal., Disp: 180 tablet, Rfl: 3   nystatin (MYCOSTATIN/NYSTOP) powder, Apply 1 application topically 3 (three) times daily. x14 days per flare, Disp: 45 g, Rfl: 3   omeprazole (PRILOSEC) 40 MG capsule, TAKE ONE (1) CAPSULE EACH DAY, Disp: 90 capsule, Rfl: 1  No Known Allergies  Objective:   BP 113/77    Pulse (!) 59    Temp (!) 97.1 F (36.2 C) (Temporal)    Ht 5\' 5"  (5.093 m)    Wt 296 lb 3.2 oz (134.4 kg)    BMI 49.29 kg/m    Physical Exam Vitals reviewed.  Constitutional:      General: He is not in acute distress.    Appearance: Normal appearance. He is not ill-appearing, toxic-appearing or diaphoretic.  HENT:     Head: Normocephalic and atraumatic.  Eyes:     General: No scleral icterus.       Right eye: No discharge.        Left eye: No discharge.     Conjunctiva/sclera: Conjunctivae normal.  Cardiovascular:     Rate and Rhythm: Normal rate.  Pulmonary:     Effort: Pulmonary effort is normal. No respiratory distress.  Musculoskeletal:        General: Normal range of motion.     Cervical back: Normal range of motion.  Skin:    General: Skin is warm and dry.     Findings: Rash present. Rash is papular (mostly on feet, some on lower legs.).      Comments: Excoriation from scratching.  Neurological:     Mental Status: He is alert and oriented to person, place, and time. Mental status is at baseline.  Psychiatric:        Mood and Affect: Mood normal.        Behavior: Behavior normal.        Thought Content: Thought content normal.        Judgment: Judgment normal.

## 2021-11-22 ENCOUNTER — Telehealth: Payer: Self-pay | Admitting: Family Medicine

## 2021-11-22 MED ORDER — TRIAMCINOLONE ACETONIDE 0.1 % EX CREA: 1.0000 "application " | TOPICAL_CREAM | Freq: Two times a day (BID) | CUTANEOUS | 0 refills | Status: DC

## 2021-11-22 NOTE — Telephone Encounter (Signed)
Seen you 02/22 for scabies please advise

## 2021-11-22 NOTE — Telephone Encounter (Signed)
I sent a new cream for him to use twice daily.

## 2021-11-22 NOTE — Telephone Encounter (Signed)
Pt had visit with Hendricks Limes last week and was prescribed some medicine for his feet. Says the cream is not working. Needs something else called in or advise on what next steps are?

## 2021-11-23 NOTE — Telephone Encounter (Signed)
Patient aware.

## 2021-11-24 ENCOUNTER — Encounter: Payer: Self-pay | Admitting: Internal Medicine

## 2021-11-24 ENCOUNTER — Ambulatory Visit (INDEPENDENT_AMBULATORY_CARE_PROVIDER_SITE_OTHER): Payer: Self-pay | Admitting: Internal Medicine

## 2021-11-24 ENCOUNTER — Other Ambulatory Visit: Payer: Self-pay

## 2021-11-24 VITALS — BP 100/60 | HR 59 | Ht 65.0 in | Wt 296.8 lb

## 2021-11-24 DIAGNOSIS — R079 Chest pain, unspecified: Secondary | ICD-10-CM

## 2021-11-24 NOTE — Patient Instructions (Signed)
Medication Instructions:  Your physician recommends that you continue on your current medications as directed. Please refer to the Current Medication list given to you today.  *If you need a refill on your cardiac medications before your next appointment, please call your pharmacy*   Lab Work: NONE   If you have labs (blood work) drawn today and your tests are completely normal, you will receive your results only by: MyChart Message (if you have MyChart) OR A paper copy in the mail If you have any lab test that is abnormal or we need to change your treatment, we will call you to review the results.   Testing/Procedures: NONE    Follow-Up: At [facility], you and your health needs are our priority.  As part of our continuing mission to provide you with exceptional heart care, we have created designated Provider Care Teams.  These Care Teams include your primary Cardiologist (physician) and Advanced Practice Providers (APPs -  Physician Assistants and Nurse Practitioners) who all work together to provide you with the care you need, when you need it.  We recommend signing up for the patient portal called "MyChart".  Sign up information is provided on this After Visit Summary.  MyChart is used to connect with patients for Virtual Visits (Telemedicine).  Patients are able to view lab/test results, encounter notes, upcoming appointments, etc.  Non-urgent messages can be sent to your provider as well.   To learn more about what you can do with MyChart, go to [email or web address].    Your next appointment:    To Be Determined   The format for your next appointment:   In Person  Provider:   Paula Ross, MD   Other Instructions Thank you for choosing [facility]!    

## 2021-11-24 NOTE — Progress Notes (Signed)
? ? ? ? ?HPI ? ?Patient presents for follow-up of chest pain ? ?Thomas Ball is a 61 year old gentleman with a history of SVT, morbid obesity, reactive airway disease, diabetes.  I saw him back in 2015.  He was diagnosed with SVT and underwent ablation.  He has been followed by Crissie Sickles.  Last clinic visit was back in June.  Note cardiac catheterization in  showed normal coronary arteries.  Normal LV function. ? ?The patient said in the fall he had a bronchitis.  He had a lot of coughing for a couple of months.  After this he developed some upper chest pain that he attributed to the coughing.  In January he still had some discomfort.  This is actually resolved.  When he talks about the pain he said he does not occur with any specific specific activity.  It is not pleuritic.  May be a little positional.  Again has improved. ? ?The patient denies other types of chest pain.  Breathing is stable. ? ?It has been a difficult year, patient still mourning the loss of his wife back in April 2022 from cancer.  He notes over the holidays he gained back the weight he had lost in the fall.  He is working to get back into a diet routine. ? ?Diet     ?Breakfast   Skips ?Coke 0 ?Lunch    Salad   Zucchini, squash     ?Dinner    Murphy Oil or tuna ?No Known Allergies ? ? ?Current Outpatient Medications  ?Medication Sig Dispense Refill  ? albuterol (PROVENTIL) (2.5 MG/3ML) 0.083% nebulizer solution Take 3 mLs (2.5 mg total) by nebulization every 6 (six) hours as needed for wheezing or shortness of breath. 150 mL 1  ? albuterol (VENTOLIN HFA) 108 (90 Base) MCG/ACT inhaler Inhale 2 puffs into the lungs every 6 (six) hours as needed for wheezing or shortness of breath. 8 g 2  ? atorvastatin (LIPITOR) 40 MG tablet TAKE 1 TABLET DAILY AT 6PM 90 tablet 3  ? Azelastine HCl (ASTEPRO CHILDRENS) 0.15 % SOLN Place 1 spray into the nose in the morning and at bedtime.    ? budesonide-formoterol (SYMBICORT) 80-4.5 MCG/ACT inhaler Inhale 2  puffs into the lungs 2 (two) times daily. 1 each 3  ? clotrimazole-betamethasone (LOTRISONE) cream Apply 1 application topically daily. 45 g 0  ? diclofenac (VOLTAREN) 75 MG EC tablet Take 1 tablet (75 mg total) by mouth 2 (two) times daily. For muscle and  Joint pain 60 tablet 2  ? doxazosin (CARDURA) 1 MG tablet Take 1 tablet (1 mg total) by mouth daily. 90 tablet 1  ? fluticasone (FLONASE) 50 MCG/ACT nasal spray Place 2 sprays into both nostrils daily. 16 g 6  ? Foot Care Products [facility] GEL HEEL CUP MED/LG) MISC Wear daily until pain completely resolves 1 each 0  ? hydrOXYzine (ATARAX/VISTARIL) 10 MG tablet Take 1 tablet (10 mg total) by mouth 3 (three) times daily as needed. 30 tablet 0  ? metFORMIN (GLUCOPHAGE) 500 MG tablet Take 1 tablet (500 mg total) by mouth 2 (two) times daily with a meal. 180 tablet 3  ? nystatin (MYCOSTATIN/NYSTOP) powder Apply 1 application topically 3 (three) times daily. x14 days per flare 45 g 3  ? omeprazole (PRILOSEC) 40 MG capsule TAKE ONE (1) CAPSULE EACH DAY 90 capsule 1  ? permethrin (ELIMITE) 5 % cream Apply all over at bedtime. Leave on for 8-14 hours. Wash off in the morning.  May repeat in 1-2 weeks if symptoms persist. 60 g 0  ? triamcinolone cream (KENALOG) 0.1 % Apply 1 application topically 2 (two) times daily. 80 g 0  ? ?No current facility-administered medications for this visit.  ? ? ? ?Past Medical History:  ?Diagnosis Date  ? GERD (gastroesophageal reflux disease)   ? HCV (hepatitis C virus) DR. ZACKS  ? AUG 2013 VIRAL LOAD UNDETECTABLE  ? History of cardiac catheterization   ? a. normal cors by cath in 09/2018 with mild pulmonary HTN  ? Hypercholesteremia   ? Hypertension   ? Pancreatitis, gallstone   ? Skin cancer 1992  ? Removed from side of head  ? Sleep apnea   ? SVT (supraventricular tachycardia) ([place])   ? ? ?ROS: ? ? All systems reviewed and negative except as noted in the HPI. ? ? ?Past Surgical History:  ?Procedure Laterality Date  ? ABLATION  06-22-14  ?  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 CHOLECYSTECTOMY    ? COLONOSCOPY  10/04/2012  ? SLF:ONE/8 SIMPLE ADENOMA, TICS, SML IH  ? ESOPHAGOGASTRODUODENOSCOPY N/A 09/04/2014  ? Procedure: ESOPHAGOGASTRODUODENOSCOPY (EGD);  Surgeon: Danie Binder, MD;  [facility];  Service: Endoscopy;  Laterality: N/A;  145  ? GALLBLADDER SURGERY    ? JAW BROKEN    ? LIPOMA REMOVALS    ? SEVERAL  ? RIGHT/LEFT HEART CATH AND CORONARY ANGIOGRAPHY N/A 10/08/2018  ? Procedure: RIGHT/LEFT HEART CATH AND CORONARY ANGIOGRAPHY;  Surgeon: Martinique, Peter M, MD;  Location: [place] [facility];  Service: Cardiovascular;  Laterality: N/A;  ? SUPRAVENTRICULAR TACHYCARDIA ABLATION N/A 06/22/2014  ? Procedure: SUPRAVENTRICULAR TACHYCARDIA ABLATION;  Surgeon: Evans Lance, MD;  Location: [facility] CATH LAB;  Service: Cardiovascular;  Laterality: N/A;  ? ? ? ?Family History  ?Problem Relation Age of Onset  ? Emphysema Mother   ? COPD Mother   ? Early death Mother   ? Hyperlipidemia Father   ? Stroke Father   ? Diabetes Father   ? High Cholesterol Father   ? Colon cancer Neg Hx   ? ? ? ?Social History  ? ?Socioeconomic History  ? Marital status: Widowed  ?  Spouse name: Not on file  ? Number of children: Not on file  ? Years of education: Not on file  ? Highest education level: Not on file  ?Occupational History  ? Occupation: concrete work  ?  Employer: SELF-EMPLOYED  ?Tobacco Use  ? Smoking status: Every Day  ?  Packs/day: 1.00  ?  Years: 35.00  ?  Pack years: 35.00  ?  Types: Cigarettes  ? Smokeless tobacco: Never  ? Tobacco comments:  ?  smokes 1-1.5 packs per day  ?Vaping Use  ? Vaping Use: Never used  ?Substance and Sexual Activity  ? Alcohol use: No  ? Drug use: No  ?  Comment: in the 1980s, smoke crack. None since Dec 2011.   ? Sexual activity: Not Currently  ?Other Topics Concern  ? Not on file  ?Social History Narrative  ? Not on file  ? ?Social Determinants of Health  ? ?Financial Resource Strain: Not on file  ?Food Insecurity: Not on file   ?Transportation Needs: Not on file  ?Physical Activity: Not on file  ?Stress: Not on file  ?Social Connections: Not on file  ?Intimate Partner Violence: Not on file  ? ? ? ?BP 100/60   Pulse (!) 59   Ht 5\' 5"  (1.651 m)   Wt 296 lb  12.8 oz (134.6 kg)   SpO2 94%   BMI 49.39 kg/m?  ? ?Physical Exam: ? ?Morbidly obese 61 year old appearing NAD ?HEENT: Unremarkable ?Neck:  No JVD, no thyromegally.  No bruits ?Lungs:  Clear with no wheezes ?HEART:  Regular rate rhythm, no murmurs ?Abd:  soft, morbidly obese, positive bowel sounds, no organomegally, nontender  ?Ext:  2 plus pulses,  edema, no cyanosis,Skin:  No rashes no nodules ?Neuro:  CN II through XII intact, motor grossly intact ? ? ?Assess/Plan: ? ?1.  Chest pain.  Chest pain I think is probably musculoskeletal in origin.  I do not think cardiac.  Most likely related to the coughing he did last fall.  Follow.  History of normal ? ?2.  History of SVT.  Patient is status post ablation.  No recurrence. ?3.. Obesity -morbid obesity.  Congratulated on the loss he had.  I think he can get back down.  His goal for this year is 240.  I think he can do this limit carbs.   ? ?4.  Diabetes.  Needs better control.  Again diet.   ? ?3. Dyspnea -chronic.  No change.  Most likely related to size, deconditioning. ? ? ?Fay Records, MD. ?

## 2021-12-06 ENCOUNTER — Ambulatory Visit (INDEPENDENT_AMBULATORY_CARE_PROVIDER_SITE_OTHER): Payer: Self-pay | Admitting: Family Medicine

## 2021-12-06 ENCOUNTER — Encounter: Payer: Self-pay | Admitting: Family Medicine

## 2021-12-06 VITALS — BP 111/70 | HR 52 | Temp 98.0°F | Ht 65.0 in | Wt 297.0 lb

## 2021-12-06 DIAGNOSIS — B86 Scabies: Secondary | ICD-10-CM

## 2021-12-06 MED ORDER — PERMETHRIN 5 % EX CREA: 1.0000 "application " | TOPICAL_CREAM | Freq: Once | CUTANEOUS | 0 refills | Status: AC

## 2021-12-06 NOTE — Progress Notes (Signed)
?  ? ?Subjective:  ?Patient ID: Thomas Ball, male    DOB: [birth year], 61 y.o.   MRN: [id number] ? ?Patient Care Team: ?[name], DO as PCP - General (Family Medicine) ?Fay Records, MD as PCP - Cardiology (Cardiology) ?Fields, Marga Melnick, MD (Inactive) as Attending Physician (Gastroenterology)  ? ?Chief Complaint:  No chief complaint on file. ? ? ?HPI: ?Thomas Ball is a 61 y.o. male presenting on 12/06/2021 for No chief complaint on file. ? ? ?Pt presents today for follow up after treatment for scabies. He states he used the cream three nights in a row. Improved slightly but not resolved. Has not washed bed clothes in hot water.  ?  ? ? ?Relevant past medical, surgical, family, and social history reviewed and updated as indicated.  ?Allergies and medications reviewed and updated. Data reviewed: Chart in Epic. ? ? ?Past Medical History:  ?Diagnosis Date  ? GERD (gastroesophageal reflux disease)   ? HCV (hepatitis C virus) DR. ZACKS  ? AUG 2013 VIRAL LOAD UNDETECTABLE  ? History of cardiac catheterization   ? a. normal cors by cath in 09/2018 with mild pulmonary HTN  ? Hypercholesteremia   ? Hypertension   ? Pancreatitis, gallstone   ? Skin cancer 1992  ? Removed from side of head  ? Sleep apnea   ? SVT (supraventricular tachycardia) ([place])   ? ? ?Past Surgical History:  ?Procedure Laterality Date  ? ABLATION  06-22-14  ? 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 CHOLECYSTECTOMY    ? COLONOSCOPY  10/04/2012  ? SLF:ONE/8 SIMPLE ADENOMA, TICS, SML IH  ? ESOPHAGOGASTRODUODENOSCOPY N/A 09/04/2014  ? Procedure: ESOPHAGOGASTRODUODENOSCOPY (EGD);  Surgeon: Danie Binder, MD;  [facility];  Service: Endoscopy;  Laterality: N/A;  145  ? GALLBLADDER SURGERY    ? JAW BROKEN    ? LIPOMA REMOVALS    ? SEVERAL  ? RIGHT/LEFT HEART CATH AND CORONARY ANGIOGRAPHY N/A 10/08/2018  ? Procedure: RIGHT/LEFT HEART CATH AND CORONARY ANGIOGRAPHY;  Surgeon: Martinique, Peter M, MD;  Location: [place] [facility];  Service:  Cardiovascular;  Laterality: N/A;  ? SUPRAVENTRICULAR TACHYCARDIA ABLATION N/A 06/22/2014  ? Procedure: SUPRAVENTRICULAR TACHYCARDIA ABLATION;  Surgeon: Evans Lance, MD;  Location: [facility] CATH LAB;  Service: Cardiovascular;  Laterality: N/A;  ? ? ?Social History  ? ?Socioeconomic History  ? Marital status: Widowed  ?  Spouse name: Not on file  ? Number of children: Not on file  ? Years of education: Not on file  ? Highest education level: Not on file  ?Occupational History  ? Occupation: concrete work  ?  Employer: SELF-EMPLOYED  ?Tobacco Use  ? Smoking status: Every Day  ?  Packs/day: 1.00  ?  Years: 35.00  ?  Pack years: 35.00  ?  Types: Cigarettes  ? Smokeless tobacco: Never  ? Tobacco comments:  ?  smokes 1-1.5 packs per day  ?Vaping Use  ? Vaping Use: Never used  ?Substance and Sexual Activity  ? Alcohol use: No  ? Drug use: No  ?  Comment: in the 1980s, smoke crack. None since Dec 2011.   ? Sexual activity: Not Currently  ?Other Topics Concern  ? Not on file  ?Social History Narrative  ? Not on file  ? ?Social Determinants of Health  ? ?Financial Resource Strain: Not on file  ?Food Insecurity: Not on file  ?Transportation Needs: Not on file  ?Physical Activity: Not on file  ?Stress: Not on file  ?  Social Connections: Not on file  ?Intimate Partner Violence: Not on file  ? ? ?Outpatient Encounter Medications as of 12/06/2021  ?Medication Sig  ? permethrin (ELIMITE) 5 % cream Apply 1 application. topically once for 1 dose. Place at night, leave on at least 8 hours and then wash off. Repeat process in 7-10 days.  ? albuterol (PROVENTIL) (2.5 MG/3ML) 0.083% nebulizer solution Take 3 mLs (2.5 mg total) by nebulization every 6 (six) hours as needed for wheezing or shortness of breath.  ? albuterol (VENTOLIN HFA) 108 (90 Base) MCG/ACT inhaler Inhale 2 puffs into the lungs every 6 (six) hours as needed for wheezing or shortness of breath.  ? atorvastatin (LIPITOR) 40 MG tablet TAKE 1 TABLET DAILY AT 6PM  ? Azelastine HCl  (ASTEPRO CHILDRENS) 0.15 % SOLN Place 1 spray into the nose in the morning and at bedtime.  ? budesonide-formoterol (SYMBICORT) 80-4.5 MCG/ACT inhaler Inhale 2 puffs into the lungs 2 (two) times daily.  ? clotrimazole-betamethasone (LOTRISONE) cream Apply 1 application topically daily.  ? diclofenac (VOLTAREN) 75 MG EC tablet Take 1 tablet (75 mg total) by mouth 2 (two) times daily. For muscle and  Joint pain  ? doxazosin (CARDURA) 1 MG tablet Take 1 tablet (1 mg total) by mouth daily.  ? fluticasone (FLONASE) 50 MCG/ACT nasal spray Place 2 sprays into both nostrils daily.  ? Foot Care Products [facility] GEL HEEL CUP MED/LG) MISC Wear daily until pain completely resolves  ? hydrOXYzine (ATARAX/VISTARIL) 10 MG tablet Take 1 tablet (10 mg total) by mouth 3 (three) times daily as needed.  ? metFORMIN (GLUCOPHAGE) 500 MG tablet Take 1 tablet (500 mg total) by mouth 2 (two) times daily with a meal.  ? nystatin (MYCOSTATIN/NYSTOP) powder Apply 1 application topically 3 (three) times daily. x14 days per flare  ? omeprazole (PRILOSEC) 40 MG capsule TAKE ONE (1) CAPSULE EACH DAY  ? triamcinolone cream (KENALOG) 0.1 % Apply 1 application topically 2 (two) times daily.  ? [DISCONTINUED] permethrin (ELIMITE) 5 % cream Apply all over at bedtime. Leave on for 8-14 hours. Wash off in the morning. May repeat in 1-2 weeks if symptoms persist.  ? ?No facility-administered encounter medications on file as of 12/06/2021.  ? ? ?No Known Allergies ? ?Review of Systems  ?Constitutional:  Negative for activity change, appetite change, chills, fatigue and fever.  ?HENT: Negative.    ?Eyes: Negative.   ?Respiratory:  Negative for cough, chest tightness and shortness of breath.   ?Cardiovascular:  Negative for chest pain, palpitations and leg swelling.  ?Gastrointestinal:  Negative for abdominal pain, blood in stool, constipation, diarrhea, nausea and vomiting.  ?Endocrine: Negative.   ?Genitourinary:  Negative for dysuria, frequency and  urgency.  ?Musculoskeletal:  Negative for arthralgias and myalgias.  ?Skin:  Positive for color change and rash. Negative for pallor and wound.  ?Allergic/Immunologic: Negative.   ?Neurological:  Negative for dizziness and headaches.  ?Hematological: Negative.   ?Psychiatric/Behavioral:  Negative for confusion, hallucinations, sleep disturbance and suicidal ideas.   ?All other systems reviewed and are negative. ? ?   ? ?Objective:  ?BP 111/70   Pulse (!) 52   Temp 98 ?F (36.7 ?C)   Ht '5\' 5"'$  (1.651 m)   Wt 297 lb (134.7 kg)   SpO2 96%   BMI 49.42 kg/m?   ? ?Wt Readings from Last 3 Encounters:  ?12/06/21 297 lb (134.7 kg)  ?11/24/21 296 lb 12.8 oz (134.6 kg)  ?11/16/21 296 lb 3.2 oz (134.4 kg)  ? ? ?  Physical Exam ?Vitals and nursing note reviewed.  ?Constitutional:   ?   General: He is not in acute distress. ?   Appearance: Normal appearance. He is obese. He is not ill-appearing, toxic-appearing or diaphoretic.  ?HENT:  ?   Head: Normocephalic and atraumatic.  ?Eyes:  ?   Pupils: Pupils are equal, round, and reactive to light.  ?Cardiovascular:  ?   Rate and Rhythm: Regular rhythm. Bradycardia present.  ?Pulmonary:  ?   Effort: Pulmonary effort is normal.  ?   Breath sounds: Normal breath sounds.  ?Musculoskeletal:  ?   Right lower leg: No edema.  ?   Left lower leg: No edema.  ?Skin: ?   General: Skin is warm and dry.  ?   Capillary Refill: Capillary refill takes less than 2 seconds.  ?   Findings: Erythema and rash present. Rash is papular.  ?   Comments: Papular rash to bilateral lower extremities. Slight erythema from scratching, no drainage.   ?Neurological:  ?   General: No focal deficit present.  ?   Mental Status: He is alert and oriented to person, place, and time.  ?Psychiatric:     ?   Mood and Affect: Mood normal.     ?   Behavior: Behavior normal.     ?   Thought Content: Thought content normal.     ?   Judgment: Judgment normal.  ? ? ?Results for orders placed or performed in visit on 11/15/21   ?Pulmonary Function Test  ?Result Value Ref Range  ? FVC-Pre 3.20 L  ? FVC-%Pred-Pre 83 %  ? FVC-Post 3.20 L  ? FVC-%Pred-Post 83 %  ? FVC-%Change-Post 0 %  ? FEV1-Pre 2.28 L  ? FEV1-%Pred-Pre 78 %  ? FEV1-Post 2.45 L  ? FEV1-%P

## 2021-12-09 ENCOUNTER — Telehealth: Payer: Self-pay | Admitting: Family Medicine

## 2021-12-09 DIAGNOSIS — L309 Dermatitis, unspecified: Secondary | ICD-10-CM

## 2021-12-09 NOTE — Telephone Encounter (Signed)
done

## 2021-12-09 NOTE — Telephone Encounter (Signed)
REFERRAL REQUEST ?Telephone Note ? ?Have you been seen at our office for this problem? YES ?(Advise that they may need an appointment with their PCP before a referral can be done) ? ?Reason for Referral: Itchy feet ?Referral discussed with patient: yes  ?Best contact number of patient for referral team: [phone]    ?Has patient been seen by a specialist for this issue before: no  ?Patient provider preference for referral: no preference ?Patient location preference for referral: no preference ?  ?Patient notified that referrals can take up to a week or longer to process. If they haven't heard anything within a week they should call back and speak with the referral department.  ? ?

## 2021-12-19 ENCOUNTER — Other Ambulatory Visit: Payer: Self-pay | Admitting: Family Medicine

## 2021-12-19 DIAGNOSIS — E119 Type 2 diabetes mellitus without complications: Secondary | ICD-10-CM

## 2021-12-21 ENCOUNTER — Ambulatory Visit: Payer: No Typology Code available for payment source | Admitting: Podiatry

## 2022-01-04 ENCOUNTER — Ambulatory Visit (INDEPENDENT_AMBULATORY_CARE_PROVIDER_SITE_OTHER): Payer: Self-pay | Admitting: Podiatry

## 2022-01-04 DIAGNOSIS — L853 Xerosis cutis: Secondary | ICD-10-CM

## 2022-01-04 DIAGNOSIS — L309 Dermatitis, unspecified: Secondary | ICD-10-CM

## 2022-01-04 MED ORDER — KETOCONAZOLE 2 % EX CREA: 1.0000 "application " | TOPICAL_CREAM | Freq: Every day | CUTANEOUS | 0 refills | Status: DC

## 2022-01-04 MED ORDER — AMMONIUM LACTATE 12 % EX LOTN: 1.0000 "application " | TOPICAL_LOTION | CUTANEOUS | 0 refills | Status: DC | PRN

## 2022-01-04 NOTE — Progress Notes (Signed)
?Subjective:  ?Patient ID: Thomas Ball, male    DOB: 1961-02-05,  MRN: [id number] ? ?Chief Complaint  ?Patient presents with  ? Tinea Pedis  ? ? ?61 y.o. male presents with the above complaint.  Patient presents with complaint bilateral plantar foot itching and burning with dryness and dermatitis.  He states his primary care physician put him on Lotrisone cream as well as steroid cream but none of which has helped.  He wanted to get it evaluated.  He has not seen anyone else prior to seeing me.  He has not seen a dermatologist.  He denies any other acute complaints.  No pain to both of the feet just constant itching ? ? ?Review of Systems: Negative except as noted in the HPI. Denies N/V/F/Ch. ? ?Past Medical History:  ?Diagnosis Date  ? GERD (gastroesophageal reflux disease)   ? HCV (hepatitis C virus) DR. ZACKS  ? AUG 2013 VIRAL LOAD UNDETECTABLE  ? History of cardiac catheterization   ? a. normal cors by cath in 09/2018 with mild pulmonary HTN  ? Hypercholesteremia   ? Hypertension   ? Pancreatitis, gallstone   ? Skin cancer 1992  ? Removed from side of head  ? Sleep apnea   ? SVT (supraventricular tachycardia) ([place])   ? ? ?Current Outpatient Medications:  ?  ammonium lactate (AMLACTIN) 12 % lotion, Apply 1 application. topically as needed for dry skin., Disp: 400 g, Rfl: 0 ?  ketoconazole (NIZORAL) 2 % cream, Apply 1 application. topically daily., Disp: 15 g, Rfl: 0 ?  albuterol (PROVENTIL) (2.5 MG/3ML) 0.083% nebulizer solution, Take 3 mLs (2.5 mg total) by nebulization every 6 (six) hours as needed for wheezing or shortness of breath., Disp: 150 mL, Rfl: 1 ?  albuterol (VENTOLIN HFA) 108 (90 Base) MCG/ACT inhaler, Inhale 2 puffs into the lungs every 6 (six) hours as needed for wheezing or shortness of breath., Disp: 8 g, Rfl: 2 ?  atorvastatin (LIPITOR) 40 MG tablet, TAKE 1 TABLET DAILY AT 6PM, Disp: 90 tablet, Rfl: 3 ?  Azelastine HCl (ASTEPRO CHILDRENS) 0.15 % SOLN, Place 1 spray into the nose in the  morning and at bedtime., Disp: , Rfl:  ?  budesonide-formoterol (SYMBICORT) 80-4.5 MCG/ACT inhaler, Inhale 2 puffs into the lungs 2 (two) times daily., Disp: 1 each, Rfl: 3 ?  clotrimazole-betamethasone (LOTRISONE) cream, Apply 1 application topically daily., Disp: 45 g, Rfl: 0 ?  diclofenac (VOLTAREN) 75 MG EC tablet, Take 1 tablet (75 mg total) by mouth 2 (two) times daily. For muscle and  Joint pain, Disp: 60 tablet, Rfl: 2 ?  doxazosin (CARDURA) 1 MG tablet, Take 1 tablet (1 mg total) by mouth daily., Disp: 90 tablet, Rfl: 1 ?  fluticasone (FLONASE) 50 MCG/ACT nasal spray, Place 2 sprays into both nostrils daily., Disp: 16 g, Rfl: 6 ?  Foot Care Products [facility] GEL HEEL CUP MED/LG) MISC, Wear daily until pain completely resolves, Disp: 1 each, Rfl: 0 ?  hydrOXYzine (ATARAX/VISTARIL) 10 MG tablet, Take 1 tablet (10 mg total) by mouth 3 (three) times daily as needed., Disp: 30 tablet, Rfl: 0 ?  metFORMIN (GLUCOPHAGE) 500 MG tablet, TAKE ONE TABLET BY MOUTH TWICE DAILY, Disp: 180 tablet, Rfl: 0 ?  nystatin (MYCOSTATIN/NYSTOP) powder, Apply 1 application topically 3 (three) times daily. x14 days per flare, Disp: 45 g, Rfl: 3 ?  omeprazole (PRILOSEC) 40 MG capsule, TAKE ONE (1) CAPSULE EACH DAY, Disp: 90 capsule, Rfl: 1 ?  triamcinolone cream (KENALOG) 0.1 %, Apply  1 application topically 2 (two) times daily., Disp: 80 g, Rfl: 0 ? ?Social History  ? ?Tobacco Use  ?Smoking Status Every Day  ? Packs/day: 1.00  ? Years: 35.00  ? Pack years: 35.00  ? Types: Cigarettes  ?Smokeless Tobacco Never  ?Tobacco Comments  ? smokes 1-1.5 packs per day  ? ? ?No Known Allergies ?Objective:  ?There were no vitals filed for this visit. ?There is no height or weight on file to calculate BMI. ?Constitutional Well developed. ?Well nourished.  ?Vascular Dorsalis pedis pulses palpable bilaterally. ?Posterior tibial pulses palpable bilaterally. ?Capillary refill normal to all digits.  ?No cyanosis or clubbing noted. ?Pedal hair growth  normal.  ?Neurologic Normal speech. ?Oriented to person, place, and time. ?Epicritic sensation to light touch grossly present bilaterally.  ?Dermatologic Dermatitis noted to the plantar foot with dry skin severe in nature.  Psychogenic excoriation noted due to itching.  Appears to be slowly spreading up the leg due to itching  ?Orthopedic: Normal joint ROM without pain or crepitus bilaterally. ?No visible deformities. ?No bony tenderness.  ? ?Radiographs: None ?Assessment:  ? ?1. Dermatitis   ?2. Xerosis of skin   ? ?Plan:  ?Patient was evaluated and treated and all questions answered. ? ?Bilateral dry skin/dermatitis/psoriasis ?-All questions and concerns were discussed with the patient in extensive detail ?-At this time I discussed with the patient he will benefit from a dermatology referral to be evaluated and rule out psoriasis or other dermatitis.  He states understanding ?-Ketoconazole cream was prescribed ?-Ammonium lactate was present prescribed for dry skin ? ?No follow-ups on file. ?

## 2022-01-10 ENCOUNTER — Telehealth: Payer: Self-pay | Admitting: Family Medicine

## 2022-01-10 DIAGNOSIS — J209 Acute bronchitis, unspecified: Secondary | ICD-10-CM

## 2022-01-10 MED ORDER — ALBUTEROL SULFATE HFA 108 (90 BASE) MCG/ACT IN AERS: 2.0000 | INHALATION_SPRAY | Freq: Four times a day (QID) | RESPIRATORY_TRACT | 2 refills | Status: DC | PRN

## 2022-01-10 NOTE — Telephone Encounter (Signed)
?  Prescription Request ? ?01/10/2022 ? ?What is the name of the medication or equipment? ALBUTEROL INHALER ? ?Have you contacted your pharmacy to request a refill? YES ? ?Which pharmacy would you like this sent to? [facility] [place] ? ?Pt says he only has about 8 puffs left of his current inhaler and uses his inhaler everyday. Needs refills ASAP.  ?

## 2022-01-11 ENCOUNTER — Other Ambulatory Visit: Payer: Self-pay | Admitting: Family Medicine

## 2022-01-13 ENCOUNTER — Telehealth: Payer: Self-pay | Admitting: Family Medicine

## 2022-01-13 NOTE — Telephone Encounter (Signed)
REFERRAL REQUEST ?Telephone Note ? ?Have you been seen at our office for this problem? Yes ?(Advise that they may need an appointment with their PCP before a referral can be done) ? ?Reason for Referral: Itchy feet ?Referral discussed with patient: yes  ?Best contact number of patient for referral team: [phone]    ?Has patient been seen by a specialist for this issue before: NO  ?Patient provider preference for referral: Don't matter ?Patient location preference for referral: [place] ?  ?Patient notified that referrals can take up to a week or longer to process. If they haven't heard anything within a week they should call back and speak with the referral department.   ?

## 2022-02-03 ENCOUNTER — Encounter: Payer: Self-pay | Admitting: Family Medicine

## 2022-02-03 ENCOUNTER — Ambulatory Visit (INDEPENDENT_AMBULATORY_CARE_PROVIDER_SITE_OTHER): Payer: Self-pay | Admitting: Family Medicine

## 2022-02-03 VITALS — Temp 98.0°F | Ht 65.0 in | Wt 285.0 lb

## 2022-02-03 DIAGNOSIS — W5501XA Bitten by cat, initial encounter: Secondary | ICD-10-CM

## 2022-02-03 MED ORDER — AMOXICILLIN-POT CLAVULANATE 875-125 MG PO TABS: 1.0000 | ORAL_TABLET | Freq: Two times a day (BID) | ORAL | 0 refills | Status: AC

## 2022-02-03 NOTE — Progress Notes (Signed)
Pt came in today to be vaccinated for possible rabies from a cat bite. He has bites to his left thumb, left earlobe, and right neck. Pt aware we do not have the required vaccinations in office. Pt sent to UC or ED for evaluation and treatment as he will need the vaccination series started and immunoglobulin given today. He is aware to go today as this is a time sensitive matter. Aware he needs to update his tetanus. Antibiotics will be needed for prophylaxis.  ?

## 2022-02-10 ENCOUNTER — Encounter: Payer: Self-pay | Admitting: Family Medicine

## 2022-02-10 ENCOUNTER — Ambulatory Visit (INDEPENDENT_AMBULATORY_CARE_PROVIDER_SITE_OTHER): Payer: Self-pay | Admitting: Family Medicine

## 2022-02-10 VITALS — BP 107/75 | HR 72 | Temp 97.0°F | Ht 65.0 in | Wt 293.0 lb

## 2022-02-10 DIAGNOSIS — M25512 Pain in left shoulder: Secondary | ICD-10-CM

## 2022-02-10 DIAGNOSIS — R2689 Other abnormalities of gait and mobility: Secondary | ICD-10-CM

## 2022-02-10 MED ORDER — METHYLPREDNISOLONE ACETATE 40 MG/ML IJ SUSP: 40.0000 mg | Freq: Once | INTRAMUSCULAR | Status: DC

## 2022-02-10 MED ORDER — METHYLPREDNISOLONE ACETATE 40 MG/ML IJ SUSP
40.0000 mg | Freq: Once | INTRAMUSCULAR | Status: AC
  Administered 2022-02-10: 40 mg via INTRAMUSCULAR

## 2022-02-10 NOTE — Progress Notes (Signed)
Subjective: CC: Shoulder pain PCP: [name], DO XNT:ZGYFVC W Nunn is a 61 y.o. male presenting to clinic today for:  1.  Shoulder pain, elbow pain Patient reports abrupt onset of left-sided shoulder pain and elbow pain.  He was seen in the ER on 02/02/2022 and had a cardiac eval.  This was negative for ACS.  It was determined this was left shoulder strain he was discharged with naproxen.  He never did start the medication because he wanted to make sure it would not interact with anything else.  He continues to have pain, particularly when he reaches behind his back and thinks that it was probably related to weightlifting.  2.  Balance issues Patient reports intermittent problems with balance.  He feels for brief seconds that he could tip forward.  He wonders if he has something on his brain or if perhaps he is just so obese that it tries to topple him over.  He notes that his weight is an issue.  ROS: Per HPI  No Known Allergies Past Medical History:  Diagnosis Date   GERD (gastroesophageal reflux disease)    HCV (hepatitis C virus) DR. ZACKS   AUG 2013 VIRAL LOAD UNDETECTABLE   History of cardiac catheterization    a. 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   Sleep apnea    SVT (supraventricular tachycardia) ([facility])     Current Outpatient Medications:    albuterol (PROVENTIL) (2.5 MG/3ML) 0.083% nebulizer solution, Take 3 mLs (2.5 mg total) by nebulization every 6 (six) hours as needed for wheezing or shortness of breath., Disp: 150 mL, Rfl: 1   albuterol (VENTOLIN HFA) 108 (90 Base) MCG/ACT inhaler, Inhale 2 puffs into the lungs every 6 (six) hours as needed for wheezing or shortness of breath., Disp: 8 g, Rfl: 2   ammonium lactate (AMLACTIN) 12 % lotion, Apply 1 application. topically as needed for dry skin., Disp: 400 g, Rfl: 0    amoxicillin-clavulanate (AUGMENTIN) 875-125 MG tablet, Take 1 tablet by mouth 2 (two) times daily for 10 days., Disp: 20 tablet, Rfl: 0   atorvastatin (LIPITOR) 40 MG tablet, TAKE 1 TABLET DAILY AT 6PM, Disp: 90 tablet, Rfl: 3   Azelastine HCl (ASTEPRO CHILDRENS) 0.15 % SOLN, Place 1 spray into the nose in the morning and at bedtime., Disp: , Rfl:    budesonide-formoterol (SYMBICORT) 80-4.5 MCG/ACT inhaler, Inhale 2 puffs into the lungs 2 (two) times daily., Disp: 1 each, Rfl: 3   clotrimazole-betamethasone (LOTRISONE) cream, Apply 1 application topically daily., Disp: 45 g, Rfl: 0   diclofenac (VOLTAREN) 75 MG EC tablet, Take 1 tablet (75 mg total) by mouth 2 (two) times daily. For muscle and  Joint pain, Disp: 60 tablet, Rfl: 2   doxazosin (CARDURA) 1 MG tablet, Take 1 tablet (1 mg total) by mouth daily., Disp: 90 tablet, Rfl: 1   fluticasone (FLONASE) 50 MCG/ACT nasal spray, Place 2 sprays into both nostrils daily., Disp: 16 g, Rfl: 6   Foot Care Products ([employer] GEL HEEL CUP MED/LG) MISC, Wear daily until pain completely resolves, Disp: 1 each, Rfl: 0   hydrOXYzine (ATARAX/VISTARIL) 10 MG tablet, Take 1 tablet (10 mg total) by mouth 3 (three) times daily as needed., Disp: 30 tablet, Rfl: 0   ketoconazole (NIZORAL) 2 % cream, Apply 1 application. topically daily., Disp:  15 g, Rfl: 0   metFORMIN (GLUCOPHAGE) 500 MG tablet, TAKE ONE TABLET BY MOUTH TWICE DAILY, Disp: 180 tablet, Rfl: 0   naproxen (NAPROSYN) 500 MG tablet, Take by mouth., Disp: , Rfl:    nystatin (MYCOSTATIN/NYSTOP) powder, Apply 1 application topically 3 (three) times daily. x14 days per flare, Disp: 45 g, Rfl: 3   omeprazole (PRILOSEC) 40 MG capsule, TAKE ONE (1) CAPSULE EACH DAY, Disp: 90 capsule, Rfl: 1   triamcinolone cream (KENALOG) 0.1 %, Apply 1 application topically 2 (two) times daily., Disp: 80 g, Rfl: 0 Social History   Socioeconomic History   Marital status: Widowed    Spouse name: Not on file   Number of children:  Not on file   Years of education: Not on file   Highest education level: Not on file  Occupational History   Occupation: concrete work    Fish farm manager: SELF-EMPLOYED  Tobacco Use   Smoking status: Every Day    Packs/day: 1.00    Years: 35.00    Pack years: 35.00    Types: Cigarettes   Smokeless tobacco: Never   Tobacco comments:    smokes 1-1.5 packs per day  Vaping Use   Vaping Use: Never used  Substance and Sexual Activity   Alcohol use: No   Drug use: No    Comment: in the 1980s, smoke crack. None since Dec 2011.    Sexual activity: Not Currently  Other Topics Concern   Not on file  Social History Narrative   Not on file   Social Determinants of Health   Financial Resource Strain: Not on file  Food Insecurity: Not on file  Transportation Needs: Not on file  Physical Activity: Not on file  Stress: Not on file  Social Connections: Not on file  Intimate Partner Violence: Not on file   Family History  Problem Relation Age of Onset   Emphysema Mother    COPD Mother    Early death Mother    Hyperlipidemia Father    Stroke Father    Diabetes Father    High Cholesterol Father    Colon cancer Neg Hx     Objective: Office vital signs reviewed. BP (!) 154/84   Pulse 72   Temp (!) 97 F (36.1 C)   Ht '5\' 5"'$  (1.651 m)   Wt 293 lb (132.9 kg)   SpO2 93%   BMI 48.76 kg/m   Physical Examination:  General: Awake, alert, morbidly obese, No acute distress HEENT: sclera white, MMM Pulm:  normal work of breathing on room air GI: abdomen obese and protuberant MSK: Ambulating independently.  Pain with internal rotation of the left shoulder.  Otherwise active range of motion normal Neuro: Cranial nerves II through XII grossly intact.  Cerebellar testing normal.  Assessment/ Plan: 61 y.o. male   Acute pain of left shoulder - Plan: methylPREDNISolone acetate (DEPO-MEDROL) injection 40 mg, DISCONTINUED: methylPREDNISolone acetate (DEPO-MEDROL) injection 40 mg  Balance  problem  Ongoing left shoulder pain.  He has had cardiac eval in the ER and this was negative.  Has not started the naproxen.  We will give Depo-Medrol since he is having quite a bit of discomfort in that shoulder with positional changes of the arm.  Reported some balance issues in the office today but this has been a chronic thing and only very brief when it occurs.  His neurologic exam was totally unremarkable but he would like to proceed with MRI of the brain at some point once he  has coverage from, patient assistance.  Again neurologic exam was normal  No orders of the defined types were placed in this encounter.  No orders of the defined types were placed in this encounter.    [name], DO [place] [phone]

## 2022-02-16 ENCOUNTER — Ambulatory Visit: Payer: Self-pay | Admitting: Family Medicine

## 2022-02-21 ENCOUNTER — Encounter: Payer: Self-pay | Admitting: Family Medicine

## 2022-03-08 ENCOUNTER — Ambulatory Visit: Payer: Self-pay | Admitting: Podiatry

## 2022-03-20 ENCOUNTER — Other Ambulatory Visit: Payer: Self-pay | Admitting: Family Medicine

## 2022-03-20 DIAGNOSIS — E119 Type 2 diabetes mellitus without complications: Secondary | ICD-10-CM

## 2022-03-21 ENCOUNTER — Encounter: Payer: Self-pay | Admitting: Primary Care

## 2022-03-21 ENCOUNTER — Ambulatory Visit (INDEPENDENT_AMBULATORY_CARE_PROVIDER_SITE_OTHER): Payer: Self-pay

## 2022-03-21 ENCOUNTER — Ambulatory Visit (INDEPENDENT_AMBULATORY_CARE_PROVIDER_SITE_OTHER): Payer: Self-pay | Admitting: Primary Care

## 2022-03-21 VITALS — BP 110/80 | HR 58 | Ht 65.0 in | Wt 295.4 lb

## 2022-03-21 DIAGNOSIS — R0609 Other forms of dyspnea: Secondary | ICD-10-CM

## 2022-03-21 DIAGNOSIS — R0600 Dyspnea, unspecified: Secondary | ICD-10-CM

## 2022-03-21 DIAGNOSIS — J209 Acute bronchitis, unspecified: Secondary | ICD-10-CM

## 2022-03-21 DIAGNOSIS — G4733 Obstructive sleep apnea (adult) (pediatric): Secondary | ICD-10-CM

## 2022-03-21 LAB — BASIC METABOLIC PANEL
BUN: 20 mg/dL (ref 6–23)
CO2: 30 mEq/L (ref 19–32)
Calcium: 9.5 mg/dL (ref 8.4–10.5)
Chloride: 101 mEq/L (ref 96–112)
Creatinine, Ser: 0.78 mg/dL (ref 0.40–1.50)
GFR: 97.61 mL/min (ref >60.00)
Glucose, Bld: 123 mg/dL — ABNORMAL HIGH (ref 70–99)
Potassium: 4.3 mEq/L (ref 3.5–5.1)
Sodium: 135 mEq/L (ref 135–145)

## 2022-03-21 LAB — BRAIN NATRIURETIC PEPTIDE: Pro B Natriuretic peptide (BNP): 192 pg/mL — ABNORMAL HIGH (ref 0.0–100.0)

## 2022-03-21 MED ORDER — SPIRIVA RESPIMAT 2.5 MCG/ACT IN AERS: 2.0000 | INHALATION_SPRAY | Freq: Every day | RESPIRATORY_TRACT | 0 refills | Status: DC

## 2022-03-21 MED ORDER — ALBUTEROL SULFATE HFA 108 (90 BASE) MCG/ACT IN AERS: 2.0000 | INHALATION_SPRAY | Freq: Four times a day (QID) | RESPIRATORY_TRACT | 2 refills | Status: DC | PRN

## 2022-03-21 MED ORDER — PREDNISONE 10 MG PO TABS: ORAL_TABLET | ORAL | 0 refills | Status: DC

## 2022-03-21 NOTE — Assessment & Plan Note (Addendum)
-   Patient is 97% compliant with CPAP use. Average usage 3 hours 16 minutes.  Pressure 5 to 20 cm H2O (12.1cm h20) with residual AHI 6.0. Moderate amount of air leaks.  May want to consider lowering max pressure at follow-up.  Continue to encourage patient aim to wear CPAP every night for minimum of 4 to 6 hours.  Encourage weight loss efforts.

## 2022-03-22 ENCOUNTER — Encounter: Payer: Self-pay | Admitting: Internal Medicine

## 2022-03-22 ENCOUNTER — Ambulatory Visit (INDEPENDENT_AMBULATORY_CARE_PROVIDER_SITE_OTHER): Payer: Self-pay | Admitting: Internal Medicine

## 2022-03-22 DIAGNOSIS — R0609 Other forms of dyspnea: Secondary | ICD-10-CM

## 2022-03-22 NOTE — Patient Instructions (Signed)
Medication Instructions:  Your physician recommends that you continue on your current medications as directed. Please refer to the Current Medication list given to you today.  *If you need a refill on your cardiac medications before your next appointment, please call your pharmacy*   Lab Work: NONE   If you have labs (blood work) drawn today and your tests are completely normal, you will receive your results only by: MyChart Message (if you have MyChart) OR A paper copy in the mail If you have any lab test that is abnormal or we need to change your treatment, we will call you to review the results.   Testing/Procedures: NONE    Follow-Up: At [facility], you and your health needs are our priority.  As part of our continuing mission to provide you with exceptional heart care, we have created designated Provider Care Teams.  These Care Teams include your primary Cardiologist (physician) and Advanced Practice Providers (APPs -  Physician Assistants and Nurse Practitioners) who all work together to provide you with the care you need, when you need it.  We recommend signing up for the patient portal called "MyChart".  Sign up information is provided on this After Visit Summary.  MyChart is used to connect with patients for Virtual Visits (Telemedicine).  Patients are able to view lab/test results, encounter notes, upcoming appointments, etc.  Non-urgent messages can be sent to your provider as well.   To learn more about what you can do with MyChart, go to [email or web address].    Your next appointment:   1 year(s)  The format for your next appointment:   In Person  Provider:   Gregg Taylor, MD    Other Instructions Thank you for choosing [facility]!    Important Information About Sugar       

## 2022-03-22 NOTE — Progress Notes (Signed)
HPI Thomas Ball returns today for followup. He is a pleasant morbidly obese 61 yo man with SVT s/p ablation and preserved LV function s/p catheterization. In the interim his dyspnea has improved somewhat. He has no obstructive CAD by heart cath. He has lost 12 pounds since his last visit. He notes that he lost his wife after a long battle with cancer. He has had PFTs which were described as normal. He has mild pulmonary HTN.  No Known Allergies   Current Outpatient Medications  Medication Sig Dispense Refill   albuterol (PROVENTIL) (2.5 MG/3ML) 0.083% nebulizer solution Take 3 mLs (2.5 mg total) by nebulization every 6 (six) hours as needed for wheezing or shortness of breath. 150 mL 1   albuterol (VENTOLIN HFA) 108 (90 Base) MCG/ACT inhaler Inhale 2 puffs into the lungs every 6 (six) hours as needed for wheezing or shortness of breath. 8 g 2   atorvastatin (LIPITOR) 40 MG tablet TAKE 1 TABLET DAILY AT 6PM 90 tablet 3   budesonide-formoterol (SYMBICORT) 80-4.5 MCG/ACT inhaler Inhale 2 puffs into the lungs 2 (two) times daily. 1 each 3   doxazosin (CARDURA) 1 MG tablet Take 1 tablet (1 mg total) by mouth daily. 90 tablet 1   fluticasone (FLONASE) 50 MCG/ACT nasal spray Place 2 sprays into both nostrils daily. 16 g 6   metFORMIN (GLUCOPHAGE) 500 MG tablet TAKE ONE TABLET BY MOUTH TWICE DAILY 180 tablet 0   omeprazole (PRILOSEC) 40 MG capsule TAKE ONE (1) CAPSULE EACH DAY 90 capsule 1   predniSONE (DELTASONE) 10 MG tablet Take 2 tablets by mouth x5 days 10 tablet 0   Tiotropium Bromide Monohydrate (SPIRIVA RESPIMAT) 2.5 MCG/ACT AERS Inhale 2 puffs into the lungs daily. 4 g 0   Foot Care Products [facility] GEL HEEL CUP MED/LG) MISC Wear daily until pain completely resolves (Patient not taking: Reported on 03/21/2022) 1 each 0   hydrOXYzine (ATARAX/VISTARIL) 10 MG tablet Take 1 tablet (10 mg total) by mouth 3 (three) times daily as needed. (Patient not taking: Reported on 03/21/2022) 30 tablet 0    ketoconazole (NIZORAL) 2 % cream Apply 1 application. topically daily. (Patient not taking: Reported on 03/21/2022) 15 g 0   naproxen (NAPROSYN) 500 MG tablet Take by mouth. (Patient not taking: Reported on 03/21/2022)     nystatin (MYCOSTATIN/NYSTOP) powder Apply 1 application topically 3 (three) times daily. x14 days per flare (Patient not taking: Reported on 03/21/2022) 45 g 3   triamcinolone cream (KENALOG) 0.1 % Apply 1 application topically 2 (two) times daily. (Patient not taking: Reported on 03/21/2022) 80 g 0   No current facility-administered medications for this visit.     Past Medical History:  Diagnosis Date   GERD (gastroesophageal reflux disease)    HCV (hepatitis C virus) DR. ZACKS   AUG 2013 VIRAL LOAD UNDETECTABLE   History of cardiac catheterization    a. 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   Sleep apnea    SVT (supraventricular tachycardia) ([place])     ROS:   All systems reviewed and negative except as noted in the HPI.   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   CHOLECYSTECTOMY     COLONOSCOPY  10/04/2012   SLF:ONE/8 SIMPLE ADENOMA, TICS,  SML IH   ESOPHAGOGASTRODUODENOSCOPY N/A 09/04/2014   Procedure: ESOPHAGOGASTRODUODENOSCOPY (EGD);  Surgeon: Danie Binder, MD;  Location: [facility];  Service: Endoscopy;  Laterality: N/A;  145   GALLBLADDER SURGERY     JAW BROKEN     LIPOMA REMOVALS     SEVERAL   RIGHT/LEFT HEART CATH AND CORONARY ANGIOGRAPHY N/A 10/08/2018   Procedure: RIGHT/LEFT HEART CATH AND CORONARY ANGIOGRAPHY;  Surgeon: Martinique, Peter M, MD;  Location: [place] [facility] LAB;  Service: Cardiovascular;  Laterality: N/A;   SUPRAVENTRICULAR TACHYCARDIA ABLATION N/A 06/22/2014   Procedure: SUPRAVENTRICULAR TACHYCARDIA ABLATION;  Surgeon: Evans Lance, MD;  Location:  [facility] CATH LAB;  Service: Cardiovascular;  Laterality: N/A;     Family History  Problem Relation Age of Onset   Emphysema Mother    COPD Mother    Early death Mother    Hyperlipidemia Father    Stroke Father    Diabetes Father    High Cholesterol Father    Colon cancer Neg Hx      Social History   Socioeconomic History   Marital status: Widowed    Spouse name: Not on file   Number of children: Not on file   Years of education: Not on file   Highest education level: Not on file  Occupational History   Occupation: concrete work    Fish farm manager: SELF-EMPLOYED  Tobacco Use   Smoking status: Every Day    Packs/day: 1.00    Years: 35.00    Total pack years: 35.00    Types: Cigarettes   Smokeless tobacco: Never   Tobacco comments:    smokes 1-1.5 packs per day  Vaping Use   Vaping Use: Never used  Substance and Sexual Activity   Alcohol use: No   Drug use: No    Comment: in the 1980s, smoke crack. None since Dec 2011.    Sexual activity: Not Currently  Other Topics Concern   Not on file  Social History Narrative   Not on file   Social Determinants of Health   Financial Resource Strain: Not on file  Food Insecurity: Not on file  Transportation Needs: Not on file  Physical Activity: Not on file  Stress: Not on file  Social Connections: Not on file  Intimate Partner Violence: Not on file     BP 140/82   Pulse 65   Ht '5\' 5"'$  (1.651 m)   Wt 296 lb 3.2 oz (134.4 kg)   SpO2 96%   BMI 49.29 kg/m   Physical Exam:  Morbidly obese appearing NAD HEENT: Unremarkable Neck:  No JVD, no thyromegally Lymphatics:  No adenopathy Back:  No CVA tenderness Lungs:  Clear with no wheezes HEART:  Regular rate rhythm, no murmurs, no rubs, no clicks Abd:  soft, positive bowel sounds, no organomegally, no rebound, no guarding Ext:  2 plus pulses, no edema, no cyanosis, no clubbing Skin:  No rashes no nodules Neuro:  CN II through XII intact, motor grossly intact   DEVICE   Normal device function.  See PaceArt for details.   Assess/Plan:  1. Obesity - this is really his biggest problem. His BMI is 49. I have strongly encouraged him to lose weight. We discussed fasting and increasing his activity.  2. SVT - he is s/p catheter ablation and has had no evidence of more SVT. He is certainly at risk for atrial fib though has no documentation of this. 3. Dyspnea - this has improved some. See above. He is  encouraged to avoid salty foods. 4. Chest pain - no obstructive disease. His symptoms are currently quiet. He thinks that it may have been related to his stress level.   Carleene Overlie Meer Reindl,MD

## 2022-03-27 ENCOUNTER — Other Ambulatory Visit: Payer: Self-pay | Admitting: *Deleted

## 2022-03-27 ENCOUNTER — Telehealth: Payer: Self-pay | Admitting: Pulmonary Disease

## 2022-03-27 ENCOUNTER — Encounter: Payer: Self-pay | Admitting: Internal Medicine

## 2022-03-27 ENCOUNTER — Encounter: Payer: Self-pay | Admitting: Nurse Practitioner

## 2022-03-27 ENCOUNTER — Ambulatory Visit (INDEPENDENT_AMBULATORY_CARE_PROVIDER_SITE_OTHER): Payer: Self-pay | Admitting: Nurse Practitioner

## 2022-03-27 VITALS — BP 117/61 | HR 57 | Temp 98.2°F | Resp 20 | Ht 65.0 in | Wt 304.0 lb

## 2022-03-27 DIAGNOSIS — R0609 Other forms of dyspnea: Secondary | ICD-10-CM

## 2022-03-27 DIAGNOSIS — E65 Localized adiposity: Secondary | ICD-10-CM

## 2022-03-27 DIAGNOSIS — B372 Candidiasis of skin and nail: Secondary | ICD-10-CM

## 2022-03-27 DIAGNOSIS — L304 Erythema intertrigo: Secondary | ICD-10-CM

## 2022-03-27 MED ORDER — NYSTATIN 100000 UNIT/GM EX POWD
1.0000 | Freq: Three times a day (TID) | CUTANEOUS | 3 refills | Status: DC
Start: 2022-03-27 — End: 2022-05-03

## 2022-03-27 NOTE — Telephone Encounter (Signed)
error 

## 2022-03-27 NOTE — Progress Notes (Signed)
   Subjective:    Patient ID: Thomas Ball, male    DOB: 06-17-1961, 60 y.o.   MRN: [id number]   Chief Complaint: Rash under stomach (Bleeding and burning/)   HPI Patient has a rash on his abdomen that is red and burning. He usually uses mediated powder but he is out.    Review of Systems  Constitutional:  Negative for diaphoresis.  Eyes:  Negative for pain.  Respiratory:  Negative for shortness of breath.   Cardiovascular:  Negative for chest pain, palpitations and leg swelling.  Gastrointestinal:  Negative for abdominal pain.  Endocrine: Negative for polydipsia.  Skin:  Negative for rash.  Neurological:  Negative for dizziness, weakness and headaches.  Hematological:  Does not bruise/bleed easily.  All other systems reviewed and are negative.      Objective:   Physical Exam Constitutional:      Appearance: Normal appearance. He is obese.  Cardiovascular:     Rate and Rhythm: Normal rate and regular rhythm.     Pulses: Normal pulses.     Heart sounds: Normal heart sounds.  Pulmonary:     Breath sounds: Normal breath sounds.  Abdominal:     General: Bowel sounds are normal.     Comments: Erythematous moist foul smelling rash under pendulous abdomen  Skin:    General: Skin is warm and dry.  Neurological:     General: No focal deficit present.     Mental Status: He is alert and oriented to person, place, and time.  Psychiatric:        Mood and Affect: Mood normal.        Behavior: Behavior normal.   BP 117/61   Pulse (!) 57   Temp 98.2 F (36.8 C) (Temporal)   Resp 20   Ht '5\' 5"'$  (1.651 m)   Wt (!) 304 lb (137.9 kg)   SpO2 96%   BMI 50.59 kg/m          Assessment & Plan:  Thomas Ball in today with chief complaint of Rash under stomach (Bleeding and burning/)   1. Cutaneous candidiasis  2. Abdominal panniculus Keep area as dry as possible Do not scratch - nystatin (MYCOSTATIN/NYSTOP) powder; Apply 1 Application topically 3 (three) times  daily. x14 days per flare  Dispense: 60 g; Refill: 3    The above assessment and management plan was discussed with the patient. The patient verbalized understanding of and has agreed to the management plan. Patient is aware to call the clinic if symptoms persist or worsen. Patient is aware when to return to the clinic for a follow-up visit. Patient educated on when it is appropriate to go to the emergency department.   Mary-Margaret Hassell Done, FNP

## 2022-03-27 NOTE — Patient Instructions (Signed)
Skin Yeast Infection  A skin yeast infection is a condition in which there is an overgrowth of yeast (Candida) that normally lives on the skin. This condition usually occurs in areas of the skin that are constantly warm and moist, such as the skin under the breasts or armpits, or in the groin and other body folds. What are the causes? This condition is caused by a change in the normal balance of the yeast that live on the skin. What increases the risk? You are more likely to develop this condition if you: Are obese. Are pregnant. Are 65 years of age or older. Wear tight clothing. Have any of the following conditions: Diabetes. Malnutrition. A weak body defense system (immune system). Take medicines such as: Birth control pills. Antibiotics. Steroid medicines. What are the signs or symptoms? The most common symptom of this condition is itchiness in the affected area. Other symptoms include: A red, swollen area of the skin. Bumps on the skin. How is this diagnosed? This condition is diagnosed with a medical history and physical exam. Your health care provider may check for yeast by taking scrapings of the skin to be viewed under a microscope. How is this treated? This condition is treated with medicine. Medicines may be prescribed or available over the counter. The medicines may be: Taken by mouth (orally). Applied as a cream or powder to your skin. Follow these instructions at home:  Take or apply over-the-counter and prescription medicines only as told by your health care provider. Maintain a healthy weight. If you need help losing weight, talk with your health care provider. Keep your skin clean and dry. Wear loose-fitting clothing. If you have diabetes, keep your blood sugar under control. Keep all follow-up visits. This is important. Contact a health care provider if: Your symptoms go away and then come back. Your symptoms do not get better with treatment. Your symptoms get  worse. Your rash spreads. You have a fever or chills. You have new symptoms. You have new warmth or redness of your skin. Your rash is painful or bleeding. Summary A skin yeast infection is a condition in which there is an overgrowth of yeast (Candida) that normally lives on the skin. Take or apply over-the-counter and prescription medicines only as told by your health care provider. Keep your skin clean and dry. Contact a health care provider if your symptoms do not get better with treatment. This information is not intended to replace advice given to you by your health care provider. Make sure you discuss any questions you have with your health care provider. Document Revised: 11/30/2020 Document Reviewed: 11/30/2020 Elsevier Patient Education  2023 Elsevier Inc.  

## 2022-03-27 NOTE — Telephone Encounter (Signed)
Cardiology will call to schedule it once the auth is received left message for the patient

## 2022-03-30 ENCOUNTER — Ambulatory Visit (HOSPITAL_COMMUNITY): Payer: Self-pay | Attending: Primary Care

## 2022-03-30 DIAGNOSIS — R0609 Other forms of dyspnea: Secondary | ICD-10-CM | POA: Insufficient documentation

## 2022-03-30 LAB — ECHOCARDIOGRAM COMPLETE
Area-P 1/2: 4.6 cm2
S' Lateral: 3.9 cm

## 2022-03-30 MED ORDER — PERFLUTREN LIPID MICROSPHERE
1.0000 mL | INTRAVENOUS | Status: AC | PRN
  Administered 2022-03-30: 3 mL via INTRAVENOUS

## 2022-04-03 ENCOUNTER — Telehealth: Payer: Self-pay | Admitting: Cardiology

## 2022-04-03 ENCOUNTER — Telehealth: Payer: Self-pay | Admitting: Internal Medicine

## 2022-04-03 ENCOUNTER — Telehealth: Payer: Self-pay | Admitting: Family Medicine

## 2022-04-03 NOTE — Telephone Encounter (Signed)
Pulmonary ordered echo, I left message that patient needs to contact Dr.Soods office for results.

## 2022-04-03 NOTE — Telephone Encounter (Signed)
Patient called to receive echo results

## 2022-04-03 NOTE — Telephone Encounter (Signed)
Pt called requesting that PCP send in order for him to get a new Nebulizer machine.   Says the one he's had for 20 years isnt working anymore and he really needs a new one.   Please advise and call pt.

## 2022-04-03 NOTE — Telephone Encounter (Signed)
error 

## 2022-04-04 ENCOUNTER — Emergency Department (HOSPITAL_COMMUNITY)
Admission: EM | Admit: 2022-04-04 | Discharge: 2022-04-04 | Discharge status: Against Medical Advice | Payer: Self-pay | Attending: Emergency Medicine | Admitting: Emergency Medicine

## 2022-04-04 ENCOUNTER — Telehealth: Payer: Self-pay | Admitting: Pulmonary Disease

## 2022-04-04 DIAGNOSIS — R0600 Dyspnea, unspecified: Secondary | ICD-10-CM

## 2022-04-04 DIAGNOSIS — J209 Acute bronchitis, unspecified: Secondary | ICD-10-CM

## 2022-04-04 DIAGNOSIS — R0609 Other forms of dyspnea: Secondary | ICD-10-CM

## 2022-04-04 DIAGNOSIS — R519 Headache, unspecified: Secondary | ICD-10-CM

## 2022-04-04 DIAGNOSIS — G44201 Tension-type headache, unspecified, intractable: Secondary | ICD-10-CM | POA: Insufficient documentation

## 2022-04-04 NOTE — Telephone Encounter (Signed)
Order placed.   Called and notified him and patient was asking about echo results. Results unavailable will route to Geraldo Pitter NP, please advise.

## 2022-04-04 NOTE — ED Provider Notes (Signed)
Thomas Ball Provider Note   CSN: [id number] Arrival date & time: 04/04/22  2831     History  No chief complaint on file.   Thomas Ball is a 61 y.o. male.  Patient placed in room 12 immediately signed up for.  They could not find patient to bring him back.  So patient was never seen.  Charge nurse states patient left from the waiting room.       Home Medications Prior to Admission medications   Medication Sig Start Date End Date Taking? Authorizing Provider  albuterol (PROVENTIL) (2.5 MG/3ML) 0.083% nebulizer solution Take 3 mLs (2.5 mg total) by nebulization every 6 (six) hours as needed for wheezing or shortness of breath. 08/04/21   Baruch Gouty, FNP  albuterol (VENTOLIN HFA) 108 (90 Base) MCG/ACT inhaler Inhale 2 puffs into the lungs every 6 (six) hours as needed for wheezing or shortness of breath. 03/21/22   Martyn Ehrich, NP  atorvastatin (LIPITOR) 40 MG tablet TAKE 1 TABLET DAILY AT 6PM 07/13/21   Ronnie Doss M, DO  budesonide-formoterol (SYMBICORT) 80-4.5 MCG/ACT inhaler Inhale 2 puffs into the lungs 2 (two) times daily. 12/23/20   Hassell Done Mary-Margaret, FNP  doxazosin (CARDURA) 1 MG tablet Take 1 tablet (1 mg total) by mouth daily. 01/18/21   [name], DO  fluticasone (FLONASE) 50 MCG/ACT nasal spray Place 2 sprays into both nostrils daily. Patient not taking: Reported on 03/27/2022 04/29/21   [name], DO  Foot Care Products [facility] GEL HEEL CUP MED/LG) MISC Wear daily until pain completely resolves Patient not taking: Reported on 03/21/2022 09/09/21   Claretta Fraise, MD  hydrOXYzine (ATARAX/VISTARIL) 10 MG tablet Take 1 tablet (10 mg total) by mouth 3 (three) times daily as needed. Patient not taking: Reported on 03/21/2022 12/07/20   Ivy Lynn, NP  ketoconazole (NIZORAL) 2 % cream Apply 1 application. topically daily. Patient not taking: Reported on 03/21/2022 01/04/22   Felipa Furnace, DPM  metFORMIN (GLUCOPHAGE)  500 MG tablet TAKE ONE TABLET BY MOUTH TWICE DAILY 03/20/22   Ronnie Doss M, DO  naproxen (NAPROSYN) 500 MG tablet Take by mouth. Patient not taking: Reported on 03/21/2022 02/02/22   [provider]  nystatin (MYCOSTATIN/NYSTOP) powder Apply 1 Application topically 3 (three) times daily. x14 days per flare 03/27/22   Chevis Pretty, FNP  omeprazole (PRILOSEC) 40 MG capsule TAKE ONE (1) CAPSULE EACH DAY 11/16/21   Ronnie Doss M, DO  Tiotropium Bromide Monohydrate (SPIRIVA RESPIMAT) 2.5 MCG/ACT AERS Inhale 2 puffs into the lungs daily. 03/21/22   Martyn Ehrich, NP  triamcinolone cream (KENALOG) 0.1 % Apply 1 application topically 2 (two) times daily. Patient not taking: Reported on 03/21/2022 11/22/21   Loman Brooklyn, FNP      Allergies    Patient has no known allergies.    Review of Systems   Review of Systems  Physical Exam Updated Vital Signs There were no vitals taken for this visit. Physical Exam  ED Results / Procedures / Treatments   Labs (all labs ordered are listed, but only abnormal results are displayed) Labs Reviewed - No data to display  EKG None  Radiology No results found.  Procedures Procedures    Medications Ordered in ED Medications - No data to display  ED Course/ Medical Decision Making/ A&P  Medical Decision Making   Final Clinical Impression(s) / ED Diagnoses Final diagnoses:  Intractable headache, unspecified chronicity pattern, unspecified headache type    Rx / DC Orders ED Discharge Orders     None         Fredia Sorrow, MD 04/04/22 [phone]

## 2022-04-04 NOTE — Telephone Encounter (Signed)
Patient states he don't have insurance and what he has is not working. The steam don't come out. Can order off line but he don't want to do that.

## 2022-04-04 NOTE — Telephone Encounter (Signed)
Echo was on 7/6.  Nothing further needed.

## 2022-04-04 NOTE — Telephone Encounter (Signed)
LMTCB In looking at pt's chart, he has upcoming appt w/ Dr. Lajuana Ripple that he can wait to see her then for her to order the nebulizer and have documentation for the order. Or he had a visit with his Pulmonologist on 03/21/22 who has documentation that he uses his nebulizer and they may be willing to write the order for a new nebulizer. Most DME company's and insurance company's require OV notes with documentation.

## 2022-04-04 NOTE — Progress Notes (Signed)
Please let patient know echo was essentially normal. Normal ejection fraction. No valve problems. Left atrium moderately enlarged, recommend keeping blood pressure well controlled. Follow up with cardiology as needed.

## 2022-04-05 ENCOUNTER — Telehealth: Payer: Self-pay | Admitting: Pulmonary Disease

## 2022-04-05 NOTE — Telephone Encounter (Signed)
Called patient and went over echo results with him. Patient verbalized understanding. Nothing further needed

## 2022-04-05 NOTE — Telephone Encounter (Signed)
I sent result note to my nurse I believe

## 2022-04-05 NOTE — Telephone Encounter (Signed)
Pt requesting results of echo.       Echo was ordered by Geraldo Pitter NP. Please advise.  Thanks!

## 2022-04-06 ENCOUNTER — Telehealth: Payer: Self-pay | Admitting: Family Medicine

## 2022-04-06 NOTE — Telephone Encounter (Signed)
PT AWARE TO TAKE 1/2 TABLET PER DR Warrick Parisian

## 2022-04-17 ENCOUNTER — Ambulatory Visit (INDEPENDENT_AMBULATORY_CARE_PROVIDER_SITE_OTHER): Payer: Self-pay | Admitting: Family Medicine

## 2022-04-17 ENCOUNTER — Encounter: Payer: Self-pay | Admitting: Family Medicine

## 2022-04-17 VITALS — BP 109/68 | HR 61 | Temp 97.2°F | Ht 65.0 in | Wt 297.6 lb

## 2022-04-17 DIAGNOSIS — R001 Bradycardia, unspecified: Secondary | ICD-10-CM

## 2022-04-17 DIAGNOSIS — E785 Hyperlipidemia, unspecified: Secondary | ICD-10-CM

## 2022-04-17 DIAGNOSIS — E1169 Type 2 diabetes mellitus with other specified complication: Secondary | ICD-10-CM

## 2022-04-17 DIAGNOSIS — R519 Headache, unspecified: Secondary | ICD-10-CM

## 2022-04-17 LAB — BAYER DCA HB A1C WAIVED: HB A1C (BAYER DCA - WAIVED): 6.2 % — ABNORMAL HIGH (ref 4.8–5.6)

## 2022-04-17 MED ORDER — LORATADINE 10 MG PO TABS: 10.0000 mg | ORAL_TABLET | Freq: Every day | ORAL | 11 refills | Status: DC

## 2022-04-17 NOTE — Progress Notes (Signed)
Subjective: CC:DM PCP: [name], DO RWE:[id number] W Szafranski is a 61 y.o. male presenting to clinic today for:  1. Type 2 Diabetes with hypertension, hyperlipidemia:  Patient reports compliance with his metformin and Lipitor.  He is not on anything except for doxazosin that would affect blood pressure.  He notes that last evening he had a pulse down to 33 when he was checking his O2.  This was at rest.  He did not feel dizzy or lightheaded.  He did not feel short of breath more than his normal.  He will be seeing his pulmonologist in the next month or so.  He notes that he got up and moved around in efforts to bring the heart rate up and that did work.  He has not reached out to his cardiologist or electrophysiologist about this yet.  He goes on to state that he in fact has been utilizing Suboxone for the last several years.  This is not prescribed to him.  He notes he was not even aware of what it was until he looked it up.  He initially started using it in efforts to improve energy levels and it was given to him by one of their family friends.  He is embarrassed and has not discussed this with anybody but does want to come off of it and wonders if perhaps this has had any impact on his cardiovascular system  Last eye exam: Had done with Dr. Marin Comment, ROI completed Last foot exam: Needs Last A1c:  Lab Results  Component Value Date   HGBA1C 6.2 (H) 04/17/2022   Nephropathy screen indicated?:  Not at this time Last flu, zoster and/or pneumovax:  Immunization History  Administered Date(s) Administered   Influenza Split 11/22/2012   Influenza,inj,Quad PF,6+ Mos 07/02/2015, 09/09/2018, 06/28/2021   Pneumococcal Polysaccharide-23 11/22/2012   Rabies, IM 02/05/2022, 02/08/2022, 02/12/2022, 02/19/2022   Tdap 06/11/2012, 02/05/2022   No chest pain.  Reports shortness of breath but this is baseline and he attributes this to morbid obesity.  He reports a slight sinus headache that resolved when he  went to the beach but returned when he came back.  Compliant with Flonase   ROS: Per HPI  No Known Allergies Past Medical History:  Diagnosis Date   GERD (gastroesophageal reflux disease)    HCV (hepatitis C virus) DR. ZACKS   AUG 2013 VIRAL LOAD UNDETECTABLE   History of cardiac catheterization    a. 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   Sleep apnea    SVT (supraventricular tachycardia) (HCC)     Current Outpatient Medications:    albuterol (PROVENTIL) (2.5 MG/3ML) 0.083% nebulizer solution, Take 3 mLs (2.5 mg total) by nebulization every 6 (six) hours as needed for wheezing or shortness of breath., Disp: 150 mL, Rfl: 1   albuterol (VENTOLIN HFA) 108 (90 Base) MCG/ACT inhaler, Inhale 2 puffs into the lungs every 6 (six) hours as needed for wheezing or shortness of breath., Disp: 8 g, Rfl: 2   atorvastatin (LIPITOR) 40 MG tablet, TAKE 1 TABLET DAILY AT 6PM, Disp: 90 tablet, Rfl: 3   budesonide-formoterol (SYMBICORT) 80-4.5 MCG/ACT inhaler, Inhale 2 puffs into the lungs 2 (two) times daily., Disp: 1 each, Rfl: 3   doxazosin (CARDURA) 1 MG tablet, Take 1 tablet (1 mg total) by mouth daily., Disp: 90  tablet, Rfl: 1   metFORMIN (GLUCOPHAGE) 500 MG tablet, TAKE ONE TABLET BY MOUTH TWICE DAILY, Disp: 180 tablet, Rfl: 0   nystatin (MYCOSTATIN/NYSTOP) powder, Apply 1 Application topically 3 (three) times daily. x14 days per flare, Disp: 60 g, Rfl: 3   omeprazole (PRILOSEC) 40 MG capsule, TAKE ONE (1) CAPSULE EACH DAY, Disp: 90 capsule, Rfl: 1   Tiotropium Bromide Monohydrate (SPIRIVA RESPIMAT) 2.5 MCG/ACT AERS, Inhale 2 puffs into the lungs daily., Disp: 4 g, Rfl: 0   fluticasone (FLONASE) 50 MCG/ACT nasal spray, Place 2 sprays into both nostrils daily. (Patient not taking: Reported on 03/27/2022), Disp: 16 g, Rfl: 6   Foot Care Products [facility] GEL HEEL CUP  MED/LG) MISC, Wear daily until pain completely resolves (Patient not taking: Reported on 03/21/2022), Disp: 1 each, Rfl: 0   hydrOXYzine (ATARAX/VISTARIL) 10 MG tablet, Take 1 tablet (10 mg total) by mouth 3 (three) times daily as needed. (Patient not taking: Reported on 03/21/2022), Disp: 30 tablet, Rfl: 0   ketoconazole (NIZORAL) 2 % cream, Apply 1 application. topically daily. (Patient not taking: Reported on 03/21/2022), Disp: 15 g, Rfl: 0   naproxen (NAPROSYN) 500 MG tablet, Take by mouth. (Patient not taking: Reported on 03/21/2022), Disp: , Rfl:    triamcinolone cream (KENALOG) 0.1 %, Apply 1 application topically 2 (two) times daily. (Patient not taking: Reported on 03/21/2022), Disp: 80 g, Rfl: 0 Social History   Socioeconomic History   Marital status: Widowed    Spouse name: Not on file   Number of children: Not on file   Years of education: Not on file   Highest education level: Not on file  Occupational History   Occupation: concrete work    Fish farm manager: SELF-EMPLOYED  Tobacco Use   Smoking status: Every Day    Packs/day: 1.00    Years: 35.00    Total pack years: 35.00    Types: Cigarettes   Smokeless tobacco: Never   Tobacco comments:    smokes 1-1.5 packs per day  Vaping Use   Vaping Use: Never used  Substance and Sexual Activity   Alcohol use: No   Drug use: No    Comment: in the 1980s, smoke crack. None since Dec 2011.    Sexual activity: Not Currently  Other Topics Concern   Not on file  Social History Narrative   Not on file   Social Determinants of Health   Financial Resource Strain: Not on file  Food Insecurity: Not on file  Transportation Needs: Not on file  Physical Activity: Not on file  Stress: Not on file  Social Connections: Not on file  Intimate Partner Violence: Not on file   Family History  Problem Relation Age of Onset   Emphysema Mother    COPD Mother    Early death Mother    Hyperlipidemia Father    Stroke Father    Diabetes Father     High Cholesterol Father    Colon cancer Neg Hx     Objective: Office vital signs reviewed. BP 109/68   Pulse 61   Temp (!) 97.2 F (36.2 C)   Ht '5\' 5"'$  (1.651 m)   Wt 297 lb 9.6 oz (135 kg)   SpO2 98%   BMI 49.52 kg/m   Physical Examination:  General: Awake, alert, morbidly obese, No acute distress HEENT: Sclera white.  No nasal drainage.  No facial swelling Cardio: Bradycardic with seemingly regular rate and occasional ectopic beat, S1S2 heard, no murmurs appreciated Pulm: clear to  auscultation bilaterally, no wheezes, rhonchi or rales; normal work of breathing on room air  Assessment/ Plan: 61 y.o. male   Controlled type 2 diabetes mellitus with other specified complication, without long-term current use of insulin ([place]) - Plan: Bayer DCA Hb A1c Waived  Hyperlipidemia associated with type 2 diabetes mellitus (HCC)  Bradycardia  Sinus headache - Plan: loratadine (CLARITIN) 10 MG tablet  Sugar remains under excellent control.  Needs foot exam.  ROI completed for eye exam  Continue statin  Uncertain etiology of bradycardia.  I did discuss with the patient at length that he should not be utilizing medications that are not prescribed to him at, especially federally controlled substances.  I recommended that he seek care at a Suboxone clinic to taper off of this substance and I will also inform his cardiologist as FYI, as I am not totally sure of the impact of this medication on his heart.  Suspect that he is having sinus headaches and I have recommended allergy meds.  Continue Flonase, nasal saline.  Claritin added.  Orders Placed This Encounter  Procedures   Bayer DCA Hb A1c Waived   No orders of the defined types were placed in this encounter.    [name], DO [place] [phone]

## 2022-04-17 NOTE — Patient Instructions (Addendum)
Reaching out to Dr Harrington Challenger and Dr Lovena Le for a checkup.  Worried about that bradycardia. Continue to monitor your heart rate.

## 2022-04-19 ENCOUNTER — Telehealth: Payer: Self-pay | Admitting: Pulmonary Disease

## 2022-04-19 NOTE — Telephone Encounter (Signed)
Called and informed patient of recommendations. Nothing further needed

## 2022-04-19 NOTE — Telephone Encounter (Signed)
Called patient and he states he had a tooth pulled and was instructed to not use cpap machine for two weeks. Patient states he feels like he is suffocating at night without his cpap and is very worried about having to go without it for two weeks. He states he does have oxygen he can use at night time but he wants to make sure it would be okay for him to use O2 only at night since he typically just uses CPAP.   Patient normally sees Dr. Halford Chessman but was advised Dr. Halford Chessman is out of office until 8/7 and he would like to know if another provider who specializes in sleep can address this for him.

## 2022-04-24 NOTE — Progress Notes (Addendum)
Cardiology Office Note    Date:  04/28/2022   ID:  Thomas Ball, DOB [birth year], MRN [id number]  PCP:  [name], DO  Cardiologist:  Dorris Carnes, MD  Electrophysiologist:  None   Chief Complaint: evaluate slow heart rate  History of Present Illness:   Thomas Ball is a 61 y.o. male with history of SVT s/p ablation 2015, morbid obesity, reactive airway disease with mild obstruction on PFTs, diabetes mellitus, HCV, cirrhosis, pancreatitis, sleep apnea compliant with CPAP (followed by pulm), remote substance abuse with ongoing suboxone use who is seen for evaluation of slow HR. He also has had prior workup for CP/dyspnea with R/LHC in 09/2018 showing normal coronaries, normal LV function, normal LV filling pressures, and mild pulmonary HTN. He was seen again in 02/2022 for chest pain. F/u echo 03/2022 showed EF 55-60%, indeterminate diastolic parameters, moderate LAE, normal RV, mild RAE, no significant TR/pulm HTN noted. His CP was felt MSK in origin. In retrospect he felt this was stress related. He lost his wife from cancer last year.  He returns for follow-up at the urging of his PCP because he recently noticed intermittent low HR by a home pulse ox 2-3 weeks ago. He has a baseline HR in the 50s but has periodically checked it and it has been dipping into the 40s with rare 30s at times. The other night he also had an episode of on/off tachypalpitations in the 120s similar to his prior SVT symptoms though not nearly as severe. He has chronic unchanged dyspnea. He has not had any dizziness with his bradycardia. He did note one episode of spontaneous dizziness but did not check VS at that time. For the last few days, though, he actually hasn't noted any issues. No syncope.   He also reports a few years ago a friend was giving him strips to put under his tongue that helped with his motivation. He initially thought these were just medicines that could be obtained online but later decided  to look this up and learned it was suboxone. He has not been able to wean himself off as he feels poorly when he does this. He has since spoken with his PCP about this whom he states referred him to a program to help him come off this.  Labwork independently reviewed: 03/2022 A1C 6.2, BNP 192, K 4.3, Cr 0.78, glucose 123,  01/2022 trops neg x2, Mg 2.3, AST/ALT OK, Tbili 1.5, Hgb 14.2, plt 133 similar to prior 09/2021 PCP LDL 57  Cardiology Studies:   Studies reviewed are outlined and summarized above. Reports included below if pertinent.   2D echo 03/2022   1. Left ventricular ejection fraction, by estimation, is 55 to 60%. The  left ventricle has normal function. The left ventricle has no regional  wall motion abnormalities. Left ventricular diastolic parameters are  indeterminate.   2. Right ventricular systolic function is normal. The right ventricular  size is normal.   3. Left atrial size was moderately dilated.   4. Right atrial size was mildly dilated.   5. The mitral valve is grossly normal. Trivial mitral valve  regurgitation. No evidence of mitral stenosis.   6. The aortic valve is grossly normal. Aortic valve regurgitation is not  visualized. No aortic stenosis is present.   7. The inferior vena cava is normal in size with greater than 50%  respiratory variability, suggesting right atrial pressure of 3 mmHg.   Comparison(s): No significant change from prior study. Prior  images  reviewed side by side.   Conclusion(s)/Recommendation(s): Normal biventricular function without  evidence of hemodynamically significant valvular heart disease.   Cath 2020 The left ventricular systolic function is normal. LV end diastolic pressure is normal. The left ventricular ejection fraction is 55-65% by visual estimate. Hemodynamic findings consistent with mild pulmonary hypertension.   1. Normal coronary anatomy 2. Normal LV function 3. Normal LV filling pressures 4. Mild pulmonary  HTN 5. Normal cardiac output.    Plan: medical management.       Past Medical History:  Diagnosis Date   GERD (gastroesophageal reflux disease)    HCV (hepatitis C virus) DR. ZACKS   AUG 2013 VIRAL LOAD UNDETECTABLE   History of cardiac catheterization    a. 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   CHOLECYSTECTOMY     COLONOSCOPY  10/04/2012   SLF:ONE/8 SIMPLE ADENOMA, TICS, SML IH   ESOPHAGOGASTRODUODENOSCOPY N/A 09/04/2014   Procedure: ESOPHAGOGASTRODUODENOSCOPY (EGD);  Surgeon: Danie Binder, MD;  Location: [facility];  Service: Endoscopy;  Laterality: N/A;  145   GALLBLADDER SURGERY     JAW BROKEN     LIPOMA REMOVALS     SEVERAL   RIGHT/LEFT HEART CATH AND CORONARY ANGIOGRAPHY N/A 10/08/2018   Procedure: RIGHT/LEFT HEART CATH AND CORONARY ANGIOGRAPHY;  Surgeon: Martinique, Peter M, MD;  Location: [facility];  Service: Cardiovascular;  Laterality: N/A;   SUPRAVENTRICULAR TACHYCARDIA ABLATION N/A 06/22/2014   Procedure: SUPRAVENTRICULAR TACHYCARDIA ABLATION;  Surgeon: Evans Lance, MD;  Location: [facility] CATH LAB;  Service: Cardiovascular;  Laterality: N/A;    Current Medications: Current Meds  Medication Sig   albuterol (PROVENTIL) (2.5 MG/3ML) 0.083% nebulizer solution Take 3 mLs (2.5 mg total) by nebulization every 6 (six) hours as needed for wheezing or shortness of breath.   albuterol (VENTOLIN HFA) 108 (90 Base) MCG/ACT inhaler Inhale 2 puffs into the lungs every 6 (six) hours as needed for wheezing or shortness of breath.   atorvastatin (LIPITOR) 40 MG tablet TAKE 1 TABLET DAILY AT 6PM   budesonide-formoterol (SYMBICORT) 80-4.5 MCG/ACT inhaler Inhale 2 puffs into the lungs  2 (two) times daily.   doxazosin (CARDURA) 1 MG tablet Take 1 tablet (1 mg total) by mouth daily.   loratadine (CLARITIN) 10 MG tablet Take 1 tablet (10 mg total) by mouth daily.   metFORMIN (GLUCOPHAGE) 500 MG tablet TAKE ONE TABLET BY MOUTH TWICE DAILY   nystatin (MYCOSTATIN/NYSTOP) powder Apply 1 Application topically 3 (three) times daily. x14 days per flare   omeprazole (PRILOSEC) 40 MG capsule TAKE ONE (1) CAPSULE EACH DAY   Tiotropium Bromide Monohydrate (SPIRIVA RESPIMAT) 2.5 MCG/ACT AERS Inhale 2 puffs into the lungs daily.   triamcinolone cream (KENALOG) 0.1 % Apply 1 application topically 2 (two) times daily.      Allergies:   Patient has no known allergies.   Social History   Socioeconomic History   Marital status: Widowed    Spouse name: Not on file   Number of children: Not on file   Years of education: Not on file   Highest education level: Not on file  Occupational History  Occupation: concrete work    Fish farm manager: SELF-EMPLOYED  Tobacco Use   Smoking status: Every Day    Packs/day: 1.00    Years: 35.00    Total pack years: 35.00    Types: Cigarettes   Smokeless tobacco: Never   Tobacco comments:    smokes 1-1.5 packs per day  Vaping Use   Vaping Use: Never used  Substance and Sexual Activity   Alcohol use: No   Drug use: No    Comment: in the 1980s, smoke crack. None since Dec 2011.    Sexual activity: Not Currently  Other Topics Concern   Not on file  Social History Narrative   Not on file   Social Determinants of Health   Financial Resource Strain: Not on file  Food Insecurity: Not on file  Transportation Needs: Not on file  Physical Activity: Not on file  Stress: Not on file  Social Connections: Not on file     Family History:  The patient's family history includes COPD in his mother; Diabetes in his father; Early death in his mother; Emphysema in his mother; High Cholesterol in his father; Hyperlipidemia in his father; Stroke in his father.  There is no history of Colon cancer.  ROS:   Please see the history of present illness.  All other systems are reviewed and otherwise negative.    EKG(s)/Additional Labs   EKG:  EKG is ordered today, personally reviewed, demonstrating SB 50bpm otherwise no acute STT changes. QTc, PR intervals normal.  Recent Labs: 06/19/2021: Hemoglobin 14.7; Platelets 133 10/24/2021: ALT 21; TSH 1.460 03/21/2022: BUN 20; Creatinine, Ser 0.78; Potassium 4.3; Pro B Natriuretic peptide (BNP) 192.0; Sodium 135  Recent Lipid Panel    Component Value Date/Time   CHOL 109 10/24/2021 1159   TRIG 60 10/24/2021 1159   HDL 39 (L) 10/24/2021 1159   CHOLHDL 2.8 10/24/2021 1159   CHOLHDL 5.4 09/06/2017 1321   VLDL 24 09/06/2017 1321   LDLCALC 57 10/24/2021 1159    PHYSICAL EXAM:    VS:  BP 102/62   Pulse (!) 50   Ht '5\' 5"'$  (1.651 m)   Wt 298 lb 12.8 oz (135.5 kg)   SpO2 95%   BMI 49.72 kg/m   BMI: Body mass index is 49.72 kg/m.  GEN: Well nourished, well developed male in no acute distress HEENT: normocephalic, atraumatic Neck: no JVD, carotid bruits, or masses Cardiac: bradycardic low/normal, regular, no rubs or gallops, no edema  Respiratory:  clear to auscultation bilaterally, normal work of breathing GI: soft, nontender, nondistended, + BS MS: no deformity or atrophy Skin: warm and dry, no rash Neuro:  Alert and Oriented x 3, Strength and sensation are intact, follows commands Psych: euthymic mood, full affect  Wt Readings from Last 3 Encounters:  04/28/22 298 lb 12.8 oz (135.5 kg)  04/17/22 297 lb 9.6 oz (135 kg)  03/27/22 (!) 304 lb (137.9 kg)     ASSESSMENT & PLAN:   1. Bradycardia - will check BMET, TSH today and arrange 2 week Live zio to eval with plan to see EP back in follow-up. If he does not have any arrhythmias detected during the monitoring period, could consider extensive of repeat Zio versus ILR. (He previously had issues with the traditional monitor sticking.)   2.  Palpitations, hx of SVT -  plan Zio monitor with lytes/TSH as above. Hold off AVN blockers at this time given concerns for bradycardia as well.  3. Dyspnea on exertion - chronic, without acute  changes. Previous cardiac workup reassuring. Follows with pulm as well. Reports adherence to CPAP.  4. Dizziness - unclear if related to #1/2 as he did not check VS at that time. Will add CBC for completeness. This seemed to be an isolated episode. BP is low normal today but similar to prior.    Disposition: F/u with EP MD/EP APP in 4-6 weeks.   Medication Adjustments/Labs and Tests Ordered: Current medicines are reviewed at length with the patient today.  Concerns regarding medicines are outlined above. Medication changes, Labs and Tests ordered today are summarized above and listed in the Patient Instructions accessible in Encounters.   Signed, Charlie Pitter, PA-C  04/28/2022 9:11 AM    [place] Phone: [phone]; Fax: [phone]

## 2022-04-28 ENCOUNTER — Ambulatory Visit (INDEPENDENT_AMBULATORY_CARE_PROVIDER_SITE_OTHER): Payer: No Typology Code available for payment source

## 2022-04-28 ENCOUNTER — Ambulatory Visit (INDEPENDENT_AMBULATORY_CARE_PROVIDER_SITE_OTHER): Payer: No Typology Code available for payment source | Admitting: Physician Assistant

## 2022-04-28 ENCOUNTER — Encounter: Payer: Self-pay | Admitting: Physician Assistant

## 2022-04-28 VITALS — BP 102/62 | HR 50 | Ht 65.0 in | Wt 298.8 lb

## 2022-04-28 DIAGNOSIS — R002 Palpitations: Secondary | ICD-10-CM

## 2022-04-28 DIAGNOSIS — R001 Bradycardia, unspecified: Secondary | ICD-10-CM

## 2022-04-28 DIAGNOSIS — R0609 Other forms of dyspnea: Secondary | ICD-10-CM

## 2022-04-28 DIAGNOSIS — I471 Supraventricular tachycardia: Secondary | ICD-10-CM

## 2022-04-28 DIAGNOSIS — R42 Dizziness and giddiness: Secondary | ICD-10-CM

## 2022-04-28 NOTE — Progress Notes (Unsigned)
ZIO AT - 14 days mailed to pt's home

## 2022-04-28 NOTE — Patient Instructions (Signed)
Medication Instructions:  Your physician recommends that you continue on your current medications as directed. Please refer to the Current Medication list given to you today.  *If you need a refill on your cardiac medications before your next appointment, please call your pharmacy*   Lab Work: TODAY-BMET, TSH, CBC If you have labs (blood work) drawn today and your tests are completely normal, you will receive your results only by: [place] (if you have MyChart) OR A paper copy in the mail If you have any lab test that is abnormal or we need to change your treatment, we will call you to review the results.   Testing/Procedures: ZIO AT Long term monitor-Live Telemetry  Your physician has requested you wear a ZIO patch monitor for 14 days.  This is a single patch monitor. [employer] supplies one patch monitor per enrollment. Additional  stickers are not available.  Please do not apply patch if you will be having a Nuclear Stress Test, Echocardiogram, Cardiac CT, MRI,  or Chest Xray during the period you would be wearing the monitor. The patch cannot be worn during  these tests. You cannot remove and re-apply the ZIO AT patch monitor.  Your ZIO patch monitor will be mailed 3 day USPS to your address on file. It may take 3-5 days to  receive your monitor after you have been enrolled.  Once you have received your monitor, please review the enclosed instructions. Your monitor has  already been registered assigning a specific monitor serial # to you.   Billing and Patient Assistance Program information  Theodore Demark has been supplied with any insurance information on record for billing. [employer] offers a sliding scale Patient Assistance Program for patients without insurance, or whose  insurance does not completely cover the cost of the ZIO patch monitor. You must apply for the  Patient Assistance Program to qualify for the discounted rate. To apply, call [employer] at [phone],  select  option 4, select option 2 , ask to apply for the Patient Assistance Program, (you can request an  interpreter if needed). [employer] will ask your household income and how many people are in your  household. [employer] will quote your out-of-pocket cost based on this information. They will also be able  to set up a 12 month interest free payment plan if needed.  Applying the monitor   Shave hair from upper left chest.  Hold the abrader disc by orange tab. Rub the abrader in 40 strokes over left upper chest as indicated in  your monitor instructions.  Clean area with 4 enclosed alcohol pads. Use all pads to ensure the area is cleaned thoroughly. Let  dry.  Apply patch as indicated in monitor instructions. Patch will be placed under collarbone on left side of  chest with arrow pointing upward.  Rub patch adhesive wings for 2 minutes. Remove the white label marked "1". Remove the white label  marked "2". Rub patch adhesive wings for 2 additional minutes.  While looking in a mirror, press and release button in center of patch. A small green light will flash 3-4  times. This will be your only indicator that the monitor has been turned on.  Do not shower for the first 24 hours. You may shower after the first 24 hours.  Press the button if you feel a symptom. You will hear a small click. Record Date, Time and Symptom in  the Patient Log.   Starting the Gateway  In your kit there is a small  plastic box the size of a cellphone. This is Airline pilot. It transmits all your  recorded data to [facility]. This box must always stay within 10 feet of you. Open the box and push the *  button. There will be a light that blinks orange and then green a few times. When the light stops  blinking, the Gateway is connected to the ZIO patch. Call [employer] at [phone] to confirm your monitor is transmitting.  Returning your monitor  Remove your patch and place it inside the [place]. In the lower half of the Gateway  there is a white  bag with prepaid postage on it. Place Gateway in bag and seal. Mail package back to [place] as soon as  possible. Your physician should have your final report approximately 7 days after you have mailed back  your monitor. Call [place] at [phone] if you have questions regarding your ZIO AT  patch monitor. Call them immediately if you see an orange light blinking on your monitor.  If your monitor falls off in less than 4 days, contact our Monitor department at [phone]. If your  monitor becomes loose or falls off after 4 days call [employer] at [phone] for suggestions on  securing your monitor    Follow-Up: At [facility], you and your health needs are our priority.  As part of our continuing mission to provide you with exceptional heart care, we have created designated Provider Care Teams.  These Care Teams include your primary Cardiologist (physician) and Advanced Practice Providers (APPs -  Physician Assistants and Nurse Practitioners) who all work together to provide you with the care you need, when you need it.  We recommend signing up for the patient portal called "MyChart".  Sign up information is provided on this After Visit Summary.  MyChart is used to connect with patients for Virtual Visits (Telemedicine).  Patients are able to view lab/test results, encounter notes, upcoming appointments, etc.  Non-urgent messages can be sent to your provider as well.   To learn more about what you can do with MyChart, go to [email or web address].    Your next appointment:   4-6 week(s)  The format for your next appointment:   In Person  Provider:   You may see Cristopher Peru, MD or one of the following Advanced Practice Providers on your designated Care Team:   Tommye Standard, [place] Legrand Como "Jonni Sanger" Chalmers Cater, [place]  Other Instructions   Important Information About [place]

## 2022-04-29 LAB — CBC
Hematocrit: 42.6 % (ref 37.5–51.0)
Hemoglobin: 14.5 g/dL (ref 13.0–17.7)
MCH: 31.5 pg (ref 26.6–33.0)
MCHC: 34 g/dL (ref 31.5–35.7)
MCV: 93 fL (ref 79–97)
Platelets: 168 10*3/uL (ref 150–450)
RBC: 4.6 x10E6/uL (ref 4.14–5.80)
RDW: 12.7 % (ref 11.6–15.4)
WBC: 5.6 10*3/uL (ref 3.4–10.8)

## 2022-04-29 LAB — BASIC METABOLIC PANEL
BUN/Creatinine Ratio: 16 (ref 9–20)
BUN: 12 mg/dL (ref 6–24)
CO2: 24 mmol/L (ref 20–29)
Calcium: 9 mg/dL (ref 8.7–10.2)
Chloride: 102 mmol/L (ref 96–106)
Creatinine, Ser: 0.77 mg/dL (ref 0.76–1.27)
Glucose: 122 mg/dL — ABNORMAL HIGH (ref 70–99)
Potassium: 4.5 mmol/L (ref 3.5–5.2)
Sodium: 138 mmol/L (ref 134–144)
eGFR: 103 mL/min/{1.73_m2} (ref >59)

## 2022-04-29 LAB — TSH: TSH: 0.94 u[IU]/mL (ref 0.450–4.500)

## 2022-05-03 ENCOUNTER — Ambulatory Visit (INDEPENDENT_AMBULATORY_CARE_PROVIDER_SITE_OTHER): Payer: No Typology Code available for payment source | Admitting: Pulmonary Disease

## 2022-05-03 ENCOUNTER — Encounter: Payer: Self-pay | Admitting: Pulmonary Disease

## 2022-05-03 VITALS — BP 136/76 | HR 62 | Temp 98.1°F | Ht 65.0 in | Wt 298.8 lb

## 2022-05-03 DIAGNOSIS — R001 Bradycardia, unspecified: Secondary | ICD-10-CM

## 2022-05-03 DIAGNOSIS — R0609 Other forms of dyspnea: Secondary | ICD-10-CM

## 2022-05-03 DIAGNOSIS — G4733 Obstructive sleep apnea (adult) (pediatric): Secondary | ICD-10-CM

## 2022-05-03 DIAGNOSIS — I471 Supraventricular tachycardia: Secondary | ICD-10-CM

## 2022-05-03 DIAGNOSIS — Z720 Tobacco use: Secondary | ICD-10-CM

## 2022-05-03 DIAGNOSIS — R002 Palpitations: Secondary | ICD-10-CM

## 2022-05-03 DIAGNOSIS — J418 Mixed simple and mucopurulent chronic bronchitis: Secondary | ICD-10-CM

## 2022-05-03 MED ORDER — STIOLTO RESPIMAT 2.5-2.5 MCG/ACT IN AERS: 2.0000 | INHALATION_SPRAY | Freq: Every day | RESPIRATORY_TRACT | 5 refills | Status: DC

## 2022-05-03 NOTE — Progress Notes (Signed)
[facility]  Chief Complaint  Patient presents with   Follow-up    Cpap working well    Constitutional:  BP 136/76 (BP Location: Left Arm, Patient Position: Sitting)   Pulse 62   Temp 98.1 F (36.7 C) (Temporal)   Ht '5\' 5"'$  (1.651 m)   Wt 298 lb 12.8 oz (135.5 kg)   SpO2 96% Comment: ra  BMI 49.72 kg/m   Past Medical History:  SVT, Gallstone pancreatitis, HTN, HLD, Hep C, GERD  Past Surgical History:  His  has a past surgical history that includes JAW BROKEN; Cholecystectomy; LIPOMA REMOVALS; Colonoscopy (10/04/2012); Ablation (06-22-14); supraventricular tachycardia ablation (N/A, 06/22/2014); Esophagogastroduodenoscopy (N/A, 09/04/2014); Gallbladder surgery; and RIGHT/LEFT HEART CATH AND CORONARY ANGIOGRAPHY (N/A, 10/08/2018).  Brief Summary:  Thomas Ball is a 61 y.o. male smoker with OSA and dyspnea.      Subjective:   Chest xray from 03/21/22 was normal.  PFT showed mild obstruction.  His echo showed left atrial dilation, but otherwise normal.  He was seen by cardiology for bradycardia and has heart monitor.  He still smokes.  He smoked up to 2 packs per day, but more recently has been smoking 1 to 1.5 packs per day.  He has been smoking consistently for the past 25 years.  He has been using nicotine chew to help alleviate his nicotine cravings, and he is hoping that this will help him quit cigarettes.    He has cough with chest congestion and clear sputum.  He used spiriva and this helped, but he ran out and wasn't sure he needed a refill.  He doesn't have symbicort anymore either.  He uses his albuterol a few times per week and this helps.  He is sleeping better with CPAP, and this helps when he uses it.  He is starting to exercise more and is planning on changing his diet.  He is hoping to get his weight down to around 200 lbs eventually.  Physical Exam:   Appearance - well kempt   ENMT - no sinus tenderness, no oral  exudate, no LAN, Mallampati 4 airway, no stridor  Respiratory - equal breath sounds bilaterally, no wheezing or rales  CV - s1s2 regular rate and rhythm, no murmurs  Ext - no clubbing, no edema  Skin - no rashes  Psych - normal mood and affect      Pulmonary testing:  PFT 12/02/18 >> FEV1 2.78 (92%), FEV1% 79, TLC 4.83 (83%), DLCO 97% PFT 11/15/21 >> FEV1 2.45 (84%), FEV1% 77, TLC 5.66 (97%), DLCO 99%  Chest Imaging:  CT angio chest 04/28/17 >> calcified granuloma LUL  Sleep Tests:  PSG 08/01/17 >> AHI 40.4, SpO2 low 74% Auto CPAP 04/01/22 to 04/30/22 >> used on 29 of 30 nights with average 3 hrs 15 min.  Average AHI 4.7 with median CPAP 6 and 95 th percentile CPAP 11 cm H2O  Cardiac Tests:  Echo 09/16/18 >> EF 60 to 65%, mild LVH, grade 1 DD RHC/LHC 10/08/18 >> no CAD, mild pulmonary HTN Echo 03/30/22 >> EF 55 to 60%, mod LA dilation  Social History:  He  reports that he has been smoking cigarettes. He has a 35.00 pack-year smoking history. He has never used smokeless tobacco. He reports that he does not drink alcohol and does not use drugs.  Family History:  His family history includes COPD in his mother; Diabetes in his father; Early death in his mother; Emphysema in his mother; High  Cholesterol in his father; Hyperlipidemia in his father; Stroke in his father.     Assessment/Plan:   Obstructive sleep apnea. - he will continue to purchase supplies on his own  - continue auto CPAP 5 to 15 cm H2O   COPD with chronic bronchitis. - will have him try stiolto two puffs daily - prn albuterol - he has a nebulizer machine   Tobacco abuse. - discussed options to assist with smoking cessation - will have him speak to our team in [place] to discuss enrollment in lung cancer screening program  Obesity. - encouraged him to keep up with diet modification and exercise regimen  Post nasal drip with deviated septum. - prn claritin  Time Spent Involved in Patient Care on Day  of Examination:  39 minutes  Follow up:   Patient Instructions  Will arrange for referral to Thomas Ball to discuss lung cancer screening program  Stiolto two puffs daily  Follow up in 6 months  Medication List:   Allergies as of 05/03/2022   No Known Allergies      Medication List        Accurate as of May 03, 2022 10:57 AM. If you have any questions, ask your nurse or doctor.          STOP taking these medications    budesonide-formoterol 80-4.5 MCG/ACT inhaler Commonly known as: SYMBICORT Stopped by: Thomas Mires, MD   nystatin powder Commonly known as: MYCOSTATIN/NYSTOP Stopped by: Thomas Mires, MD   Spiriva Respimat 2.5 MCG/ACT Aers Generic drug: Tiotropium Bromide Monohydrate Stopped by: Thomas Mires, MD   triamcinolone cream 0.1 % Commonly known as: KENALOG Stopped by: Thomas Mires, MD       TAKE these medications    albuterol (2.5 MG/3ML) 0.083% nebulizer solution Commonly known as: PROVENTIL Take 3 mLs (2.5 mg total) by nebulization every 6 (six) hours as needed for wheezing or shortness of breath.   albuterol 108 (90 Base) MCG/ACT inhaler Commonly known as: VENTOLIN HFA Inhale 2 puffs into the lungs every 6 (six) hours as needed for wheezing or shortness of breath.   atorvastatin 40 MG tablet Commonly known as: LIPITOR TAKE 1 TABLET DAILY AT 6PM   doxazosin 1 MG tablet Commonly known as: Cardura Take 1 tablet (1 mg total) by mouth daily.   loratadine 10 MG tablet Commonly known as: CLARITIN Take 1 tablet (10 mg total) by mouth daily.   metFORMIN 500 MG tablet Commonly known as: GLUCOPHAGE TAKE ONE TABLET BY MOUTH TWICE DAILY   omeprazole 40 MG capsule Commonly known as: PRILOSEC TAKE ONE (1) CAPSULE EACH DAY   Stiolto Respimat 2.5-2.5 MCG/ACT Aers Generic drug: Tiotropium Bromide-Olodaterol Inhale 2 puffs into the lungs daily. Started by: Thomas Mires, MD        Signature:  Thomas Mires, MD [place] Pager - [phone] 05/03/2022, 10:57 AM

## 2022-05-03 NOTE — Patient Instructions (Signed)
Will arrange for referral to [name] to discuss lung cancer screening program  Stiolto two puffs daily  Follow up in 6 months

## 2022-05-04 ENCOUNTER — Telehealth: Payer: Self-pay | Admitting: Physician Assistant

## 2022-05-04 ENCOUNTER — Telehealth: Payer: Self-pay | Admitting: Pulmonary Disease

## 2022-05-04 NOTE — Telephone Encounter (Signed)
According to [employer], your device was working as of 05/04/22 at 6:00 AM.  If you need to check again , call [employer] at [phone].

## 2022-05-04 NOTE — Telephone Encounter (Signed)
Patient received his monitor and placed it on yesterday last night it came off.  He placed it back on but he doesn't know if it's on correctly.

## 2022-05-05 NOTE — Telephone Encounter (Signed)
We do not have any stiolto samples in stock  Called pt and there was no answer LMTCB

## 2022-05-10 NOTE — Telephone Encounter (Signed)
Attempted to call pt but unable to reach. Left message for him to return call. Due to multiple attempts trying to reach pt but unable to do so, per protocol encounter will be closed.

## 2022-05-11 ENCOUNTER — Telehealth: Payer: Self-pay | Admitting: Physician Assistant

## 2022-05-11 NOTE — Telephone Encounter (Signed)
Patient is wearing an event monitor, he states overnight it started flashing yellow. He took it off this morning because it was still flashing yellow.  He has been wearing it for a little over a week.  He wants to know if he should put it back on or send it back. Please advise.

## 2022-05-12 ENCOUNTER — Other Ambulatory Visit: Payer: Self-pay

## 2022-05-12 ENCOUNTER — Other Ambulatory Visit: Payer: Self-pay | Admitting: Family Medicine

## 2022-05-12 ENCOUNTER — Telehealth: Payer: Self-pay | Admitting: Pulmonary Disease

## 2022-05-12 DIAGNOSIS — E119 Type 2 diabetes mellitus without complications: Secondary | ICD-10-CM

## 2022-05-12 MED ORDER — STIOLTO RESPIMAT 2.5-2.5 MCG/ACT IN AERS: 2.0000 | INHALATION_SPRAY | Freq: Every day | RESPIRATORY_TRACT | 0 refills | Status: DC

## 2022-05-12 NOTE — Telephone Encounter (Signed)
Pt in office this morning reporting that his monitor was flashing and beeping. Pt states that he took the monitor off. Instructed pt to mail monitor back.

## 2022-05-15 ENCOUNTER — Telehealth: Payer: Self-pay | Admitting: Internal Medicine

## 2022-05-15 NOTE — Telephone Encounter (Signed)
Called patient back about message. Patient complaining that his hear rate has been low. He stated it was at the lowest yesterday at 32. Patient stated on occasion it drops to  40 to 50. Patient stated he was doing good today. Patient stated when it does drop he feels dizziness, a headache,  and a discomfort in chest. Patient was wearing a monitor and mailed it back last Friday. Patient has an appointment with Oda Kilts PA on 06/09/22. Will send message to him for advisement or see if the needs to see patient earlier.

## 2022-05-15 NOTE — Telephone Encounter (Signed)
STAT if HR is under 50 or over 120 (normal HR is 60-100 beats per minute)  What is your heart rate? HR 67  Do you have a log of your heart rate readings (document readings)? No  Do you have any other symptoms?  No  Patient stated he has already sent in his heart monitor.  Patient stated he is concerned that his heart rate drops when he is not moving around.

## 2022-05-17 NOTE — Telephone Encounter (Signed)
Shirley Friar, PA-C  Michaelyn Barter, RN; [place], [place] P Caller: Unspecified (2 days ago,  1:53 PM) Available strips via Zio appear to show normal rhythm with pretty frequent ectopy, but confounded by significant artifact.   If pt has symptomatic bradycardia in the 20s or 30s again he should be evaluated in the ED.   It would be reasonable to bring him in earlier than 9/15.  It does appear there are still some visits with EP APP later this week if pt is available to come.   Patient has an appointment on 05/18/22 with Tommye Standard PA.

## 2022-05-17 NOTE — Progress Notes (Unsigned)
Cardiology Office Note Date:  05/18/2022  Patient ID:  Thomas Ball [birth year], MRN [id number] PCP:  [name], DO  Cardiologist:  Dr. Harrington Challenger Electrophysiologist: Dr. Lovena Le    Chief Complaint:  4-6 weeks f/u  History of Present Illness: Thomas Ball is a 61 y.o. male with history of SVT (ablated 2015) DM, HCV, cirrhosis, pancreatitis, OSA w/CPAP (follows w/pulm), reactive airway disease, mild p.HTN ,  morbid obesity, remote substance abuse with ongoing suboxone.  No obstructive CAD by cath 2020.  He comes in today to be seen for Dr. Lovena Le, last seen by him June 2023, overall SOB was improved, discussed prior c/o CP with no known CAD and perhaps stress induced after his wife's death.  No symptoms of his SVT, discussed at risk of developing Afib, but to date, none noted.  He saw D. Dunn, PA-C, 04/28/22 for intermittent palpitations noting both elevated and slow rates vis his pulse ox.  His SOB described as chronic and unchanged. Did have diziness with slower rates, no syncope.  Better in the last few days.   Pending discussion with his PMD to try and get off suboxone. EKG was SB 50, planned for labs, and monitoring  Phone notes with rates reported 20's by the pt, there is mention of possible brady rates on monitoring though artifact as well, advised ER if recurrent, earlier follow up otherwise  TODAY He reports that he reached out concerned about seeing very erratic HRs on his pulse Ox. He does not have palpitations, no CP. He though regularly monitor his O2 sat and has taken notice of his HRs Can be 20's then 80's, then 40's, usually 60's-70's. This is with recurrent random checks, sometimes abrupt changes while he is watching No prolonged slow rate though when he sees the 20's he does notice that he feels a little weak or lightheaded. He mentions that has for quite some time been tired all the time and blamed his sleep apnea, wonders now if the slow rates are causing  it.  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  She passed away in 12-05-22 and has been slow to feel like he has any energy back, presumed out of shape after a long sedentary phase. Though he has started to get back to the shop, mostly leaves the physical work to the younger guys but has gotten his hands dirty more often of late.  No CP, no palpitations Does get winded with heavier activities  No near syncope or syncope  Monitor reviewed by myself is SR,  min HR is 40 and overnight, no AFib, some short few neats PATs/SVT, some PACs and PVCs Lead loss/artifact   Past Medical History:  Diagnosis Date   GERD (gastroesophageal reflux disease)    HCV (hepatitis C virus) DR. ZACKS   AUG 2013 VIRAL LOAD UNDETECTABLE   History of cardiac catheterization    a. 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  CHOLECYSTECTOMY     COLONOSCOPY  10/04/2012   SLF:ONE/8 SIMPLE ADENOMA, TICS, SML IH   ESOPHAGOGASTRODUODENOSCOPY N/A 09/04/2014   Procedure: ESOPHAGOGASTRODUODENOSCOPY (EGD);  Surgeon: Danie Binder, MD;  Location: [facility];  Service: Endoscopy;  Laterality: N/A;  145   GALLBLADDER SURGERY     JAW BROKEN     LIPOMA REMOVALS     SEVERAL   RIGHT/LEFT HEART CATH AND CORONARY ANGIOGRAPHY N/A 10/08/2018   Procedure: RIGHT/LEFT HEART CATH AND CORONARY ANGIOGRAPHY;  Surgeon: Martinique, Peter M, MD;  Location: [facility] CV LAB;  Service: Cardiovascular;  Laterality: N/A;   SUPRAVENTRICULAR TACHYCARDIA ABLATION N/A 06/22/2014   Procedure: SUPRAVENTRICULAR  TACHYCARDIA ABLATION;  Surgeon: Evans Lance, MD;  Location: [facility] CATH LAB;  Service: Cardiovascular;  Laterality: N/A;    Current Outpatient Medications  Medication Sig Dispense Refill   albuterol (PROVENTIL) (2.5 MG/3ML) 0.083% nebulizer solution Take 3 mLs (2.5 mg total) by nebulization every 6 (six) hours as needed for wheezing or shortness of breath. 150 mL 1   albuterol (VENTOLIN HFA) 108 (90 Base) MCG/ACT inhaler Inhale 2 puffs into the lungs every 6 (six) hours as needed for wheezing or shortness of breath. 8 g 2   atorvastatin (LIPITOR) 40 MG tablet TAKE 1 TABLET DAILY AT 6PM 90 tablet 3   doxazosin (CARDURA) 1 MG tablet Take 1 tablet (1 mg total) by mouth daily. 90 tablet 1   loratadine (CLARITIN) 10 MG tablet Take 1 tablet (10 mg total) by mouth daily. 30 tablet 11   metFORMIN (GLUCOPHAGE) 500 MG tablet TAKE ONE TABLET BY MOUTH TWICE DAILY 180 tablet 0   omeprazole (PRILOSEC) 40 MG capsule TAKE ONE (1) CAPSULE EACH DAY 90 capsule 1   Tiotropium Bromide-Olodaterol (STIOLTO RESPIMAT) 2.5-2.5 MCG/ACT AERS Inhale 2 puffs into the lungs daily. 1 each 5   No current facility-administered medications for this visit.    Allergies:   Patient has no known allergies.   Social History:  The patient  reports that he has been smoking cigarettes. He has a 35.00 pack-year smoking history. He has never used smokeless tobacco. He reports that he does not drink alcohol and does not use drugs.   Family History:  The patient's family history includes COPD in his mother; Diabetes in his father; Early death in his mother; Emphysema in his mother; High Cholesterol in his father; Hyperlipidemia in his father; Stroke in his father.  ROS:  Please see the history of present illness.    All other systems are reviewed and otherwise negative.   PHYSICAL EXAM:  VS:  BP 107/65   Pulse 60   Ht '5\' 5"'$  (1.651 m)   Wt 295 lb (133.8 kg)   SpO2 95%   BMI 49.09 kg/m  BMI: Body mass index is 49.09 kg/m. Well  nourished, well developed, in no acute distress HEENT: normocephalic, atraumatic Neck: no JVD, carotid bruits or masses Cardiac:  RRR; no significant murmurs, no rubs, or gallops Lungs:  CTA b/l, no wheezing, rhonchi or rales Abd: soft, nontender MS: no deformity or atrophy Ext: no edema Skin: warm and dry, no rash Neuro:  No gross deficits appreciated Psych: euthymic mood, full affect   EKG:  Done today and reviewed by myself shows  SR/ectopic atrial rhythm 60bpm, appears unchanged  03/30/2022: TTE 1. Left ventricular ejection fraction, by estimation, is 55 to 60%. The  left ventricle has normal function. The left ventricle has no regional  wall motion abnormalities. Left ventricular diastolic parameters are  indeterminate.   2. Right ventricular systolic function is normal. The right ventricular  size is normal.   3. Left atrial size was moderately dilated.   4. Right atrial size was mildly dilated.   5. The mitral valve is grossly normal. Trivial mitral valve  regurgitation. No evidence of mitral stenosis.   6. The aortic valve is grossly normal. Aortic valve regurgitation is not  visualized. No aortic stenosis is present.   7. The inferior vena cava is normal in size with greater than 50%  respiratory variability, suggesting right atrial pressure of 3 mmHg.   Comparison(s): No significant change from prior study. Prior images  reviewed side by side.   Conclusion(s)/Recommendation(s): Normal biventricular function without  evidence of hemodynamically significant valvular heart disease.     Cath 10/08/2018 The left ventricular systolic function is normal. LV end diastolic pressure is normal. The left ventricular ejection fraction is 55-65% by visual estimate. Hemodynamic findings consistent with mild pulmonary hypertension.   1. Normal coronary anatomy 2. Normal LV function 3. Normal LV filling pressures 4. Mild pulmonary HTN 5. Normal cardiac output.    Plan:  medical management.   Recent Labs: 10/24/2021: ALT 21 03/21/2022: Pro B Natriuretic peptide (BNP) 192.0 04/28/2022: BUN 12; Creatinine, Ser 0.77; Hemoglobin 14.5; Platelets 168; Potassium 4.5; Sodium 138; TSH 0.940  10/24/2021: Chol/HDL Ratio 2.8; Cholesterol, Total 109; HDL 39; LDL Chol Calc (NIH) 57; Triglycerides 60   Estimated Creatinine Clearance: 127.1 mL/min (by C-G formula based on SCr of 0.77 mg/dL).   Wt Readings from Last 3 Encounters:  05/18/22 295 lb (133.8 kg)  05/03/22 298 lb 12.8 oz (135.5 kg)  04/28/22 298 lb 12.8 oz (135.5 kg)     Other studies reviewed: Additional studies/records reviewed today include: summarized above  ASSESSMENT AND PLAN:  SVT (ablated 2015) Bradycardia Palpitations No symptoms of brady or tachy that I appreciate He did have brief bursts of PAT/SVT and some PACs, PVCs, suspect this is what he is seeing on his pulse ox. This is though anxiety provoking for him  Encouraged to continue to work on increasing his physical activity, weigh loss and conditioning  Discuss alternative technology to evaluate his HR/rhythm, fatigue such as the [place], he is very much interested in this, will let us know    Disposition: F/u with [place] in 6-8 weeks, sooner if needed  Current medicines are reviewed at length with the patient today.  The patient did not have any concerns regarding medicines.  Venetia Night, PA-C 05/18/2022 12:51 PM     [address] [place] [address] [phone] (office)  [phone] (fax)

## 2022-05-18 ENCOUNTER — Ambulatory Visit (INDEPENDENT_AMBULATORY_CARE_PROVIDER_SITE_OTHER): Payer: No Typology Code available for payment source | Admitting: Physician Assistant

## 2022-05-18 ENCOUNTER — Encounter: Payer: Self-pay | Admitting: Physician Assistant

## 2022-05-18 VITALS — BP 107/65 | HR 60 | Ht 65.0 in | Wt 295.0 lb

## 2022-05-18 DIAGNOSIS — I471 Supraventricular tachycardia: Secondary | ICD-10-CM

## 2022-05-18 NOTE — Patient Instructions (Addendum)
Medication Instructions:   Your physician recommends that you continue on your current medications as directed. Please refer to the Current Medication list given to you today.   *If you need a refill on your cardiac medications before your next appointment, please call your pharmacy*   Lab Work: [place]    If you have labs (blood work) drawn today and your tests are completely normal, you will receive your results only by: [place] (if you have MyChart) OR A paper copy in the mail If you have any lab test that is abnormal or we need to change your treatment, we will call you to review the results.   Testing/Procedures: NONE ORDERED  TODAY     Follow-Up: At [facility], you and your health needs are our priority.  As part of our continuing mission to provide you with exceptional heart care, we have created designated Provider Care Teams.  These Care Teams include your primary Cardiologist (physician) and Advanced Practice Providers (APPs -  Physician Assistants and Nurse Practitioners) who all work together to provide you with the care you need, when you need it.  We recommend signing up for the patient portal called "MyChart".  Sign up information is provided on this After Visit Summary.  MyChart is used to connect with patients for Virtual Visits (Telemedicine).  Patients are able to view lab/test results, encounter notes, upcoming appointments, etc.  Non-urgent messages can be sent to your provider as well.   To learn more about what you can do with MyChart, go to [email or web address].    Your next appointment:   6-8 week(s)  The format for your next appointment:   In Person  Provider:   You may see Cristopher Peru, MD or one of the following Advanced Practice Providers on your designated Care Team:   Tommye Standard, [place] Legrand Como "Jonni Sanger" Chalmers Cater, [place]   Other Instructions              KARDIA INFORMATION ATTACHED    Important Information About  [place]

## 2022-05-18 NOTE — Telephone Encounter (Signed)
error 

## 2022-05-23 ENCOUNTER — Ambulatory Visit: Payer: Self-pay | Admitting: Dermatology

## 2022-05-25 ENCOUNTER — Telehealth: Payer: Self-pay | Admitting: Physician Assistant

## 2022-05-25 NOTE — Telephone Encounter (Signed)
Patient c/o Palpitations:  High priority if patient c/o lightheadedness, shortness of breath, or chest pain  How long have you had palpitations/irregular HR/ Afib? Are you having the symptoms now? Not sure how long it lasted, he didn't feel anything and no other symptoms   Are you currently experiencing lightheadedness, SOB or CP? Occasional lightheadedness, but not right now   Do you have a history of afib (atrial fibrillation) or irregular heart rhythm? N/a   Have you checked your BP or HR? (document readings if available):  He was told to get a device to check for possible afib. He states it shows his hr as well and it ranged from 75-100. He is not sure how to upload his reading, I informed him through [place] and he said he would have to get his daughter to help him. He said the device showed him 5 times possible afib and to contact doctor's office.   Are you experiencing any other symptoms? no

## 2022-05-26 NOTE — Telephone Encounter (Signed)
   Seen by APP 05/18/22  ASSESSMENT AND PLAN:   SVT (ablated 2015) Bradycardia Palpitations No symptoms of brady or tachy that I appreciate He did have brief bursts of PAT/SVT and some PACs, PVCs, suspect this is what he is seeing on his pulse ox. This is though anxiety provoking for him   Encouraged to continue to work on increasing his physical activity, weigh loss and conditioning   Discuss alternative technology to evaluate his HR/rhythm, fatigue such as the [place], he is very much interested in this, will let us know      Disposition: F/u with [place] in 6-8 weeks, sooner if needed ______________________________________________________________________________   I called and left message for the patient to call back.  I do not know what device he has gotten to evaluate rhythm.

## 2022-05-31 NOTE — Telephone Encounter (Signed)
Attempted phone call to pt and left voicemail to contact triage at [phone].

## 2022-06-06 NOTE — Telephone Encounter (Signed)
Left detailed message per DPR asking patient to call us back if he is still having symptoms which he attributes to A-fib and to find out if he ever got another rhythm device such as Audiological scientist. Will await call back to office.

## 2022-06-09 ENCOUNTER — Ambulatory Visit: Payer: No Typology Code available for payment source | Admitting: Student

## 2022-06-12 ENCOUNTER — Telehealth: Payer: Self-pay | Admitting: Internal Medicine

## 2022-06-12 ENCOUNTER — Encounter: Payer: Self-pay | Admitting: Physician Assistant

## 2022-06-12 ENCOUNTER — Ambulatory Visit: Payer: No Typology Code available for payment source | Attending: Physician Assistant | Admitting: Physician Assistant

## 2022-06-12 VITALS — BP 114/68 | HR 56 | Ht 65.0 in | Wt 299.0 lb

## 2022-06-12 DIAGNOSIS — R079 Chest pain, unspecified: Secondary | ICD-10-CM

## 2022-06-12 DIAGNOSIS — I471 Supraventricular tachycardia: Secondary | ICD-10-CM

## 2022-06-12 DIAGNOSIS — E1165 Type 2 diabetes mellitus with hyperglycemia: Secondary | ICD-10-CM

## 2022-06-12 DIAGNOSIS — G4733 Obstructive sleep apnea (adult) (pediatric): Secondary | ICD-10-CM

## 2022-06-12 NOTE — Progress Notes (Signed)
Cardiology Office Note:    Date:  06/12/2022   ID:  Thomas Ball, DOB [birth year], MRN [id number]  PCP:  [name], [place] Providers Cardiologist:  Dorris Carnes, MD Electrophysiologist:  Cristopher Peru, MD     Referring MD: [name], DO   Chief Complaint:  Chest Pain     History of Present Illness:   Thomas Ball is a 61 y.o. male with history of SVT s/p ablation 2015, morbid obesity, reactive airway disease with mild obstruction on PFTs, diabetes mellitus, HCV, cirrhosis, pancreatitis, sleep apnea compliant with CPAP (followed by pulm), remote substance abuse with ongoing suboxone use who is seen for evaluation of slow HR. He also has had prior workup for CP/dyspnea with R/LHC in 09/2018 showing normal coronaries, normal LV function, normal LV filling pressures, and mild pulmonary HTN. He was seen again in 02/2022 for chest pain. F/u echo 03/2022 showed EF 55-60%, indeterminate diastolic parameters, moderate LAE, normal RV, mild RAE, no significant TR/pulm HTN noted. His CP was felt MSK in origin. In retrospect he felt this was stress related. He lost his wife from cancer last year.      Recently saw Vick Frees PA-C for fast/slow HR. Monitor 8/2023showed min HR 40 overnight, no Afib and short runs PAT/SVT  Patient added onto my schedule for chest pain. He says it's been going on for over a year. It's in his upper chest like a sore muscle. Not tight or sore. 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 Several months ago he had chest pain into his neck. Went to [facility] and told everything was ok. Doesn't have everyday but does happen often. He wonders if it's pulled muscles as he sleeps on his couch. Got a Kardia machine and it says possible Afib but other times it's normal. Cuts grass, walks 1/2 mile on occasion. Doesn't know how to eat right. Smoking 1 1/2 ppd.    Past Medical History:  Diagnosis Date   GERD  (gastroesophageal reflux disease)    HCV (hepatitis C virus) DR. ZACKS   AUG 2013 VIRAL LOAD UNDETECTABLE   History of cardiac catheterization    a. 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   Sleep apnea    SVT (supraventricular tachycardia) (HCC)    Current Medications: Current Meds  Medication Sig   albuterol (PROVENTIL) (2.5 MG/3ML) 0.083% nebulizer solution Take 3 mLs (2.5 mg total) by nebulization every 6 (six) hours as needed for wheezing or shortness of breath.   albuterol (VENTOLIN HFA) 108 (90 Base) MCG/ACT inhaler Inhale 2 puffs into the lungs every 6 (six) hours as needed for wheezing or shortness of breath.   atorvastatin (LIPITOR) 40 MG tablet TAKE 1 TABLET DAILY AT 6PM   doxazosin (CARDURA) 1 MG tablet Take 1 tablet (1 mg total) by mouth daily.   loratadine (CLARITIN) 10 MG tablet Take 1 tablet (10 mg total) by mouth daily.   metFORMIN (GLUCOPHAGE) 500 MG tablet TAKE ONE TABLET BY MOUTH TWICE DAILY   omeprazole (PRILOSEC) 40 MG capsule TAKE ONE (1) CAPSULE EACH DAY   Tiotropium Bromide-Olodaterol (STIOLTO RESPIMAT) 2.5-2.5 MCG/ACT AERS Inhale 2 puffs into the lungs daily.    Allergies:   Patient has no known allergies.   Social History   Tobacco Use  Smoking status: Every Day    Packs/day: 1.00    Years: 35.00    Total pack years: 35.00    Types: Cigarettes   Smokeless tobacco: Never   Tobacco comments:    smokes 1-1.5 packs per day  Vaping Use   Vaping Use: Never used  Substance Use Topics   Alcohol use: No   Drug use: No    Comment: in the 1980s, smoke crack. None since Dec 2011.     Family Hx: The patient's family history includes COPD in his mother; Diabetes in his father; Early death in his mother; Emphysema in his mother; High Cholesterol in his father; Hyperlipidemia in his father; Stroke in his father. There is no history of  Colon cancer.  ROS   EKGs/Labs/Other Test Reviewed:    EKG:  EKG is   ordered today.  The ekg ordered today demonstrates sinus bradycardia nonspecific ST Changes  Recent Labs: 10/24/2021: ALT 21 03/21/2022: Pro B Natriuretic peptide (BNP) 192.0 04/28/2022: BUN 12; Creatinine, Ser 0.77; Hemoglobin 14.5; Platelets 168; Potassium 4.5; Sodium 138; TSH 0.940   Recent Lipid Panel Recent Labs    10/24/21 1159  CHOL 109  TRIG 60  HDL 39*  LDLCALC 57     Prior CV Studies:   Monitor 05/17/22   Impression:     Predominent rhythm:  Sinus rhythm with rates of 42 to 99 bpm   Average SR rate 68 bpm   Rare PVCs  There were frequent supraventrular premature beats (14% total)   Rates went up to 211 bpm.   Fastest episode was 5 beats for average HR of 158 bpm   Longest was 1 min for average HR of 111 beats per minute.     Triggered events correlated with SR with isolated PACs     Echo 03/2022 IMPRESSIONS Left ventricular ejection fraction, by estimation, is 55 to 60%. The left ventricle has normal function. The left ventricle has no regional wall motion abnormalities. Left ventricular diastolic parameters are indeterminate. 1. 2. Right ventricular systolic function is normal. The right ventricular size is normal. 3. Left atrial size was moderately dilated. 4. Right atrial size was mildly dilated. The mitral valve is grossly normal. Trivial mitral valve regurgitation. No evidence of mitral stenosis. 5. The aortic valve is grossly normal. Aortic valve regurgitation is not visualized. No aortic stenosis is present. 6. The inferior vena cava is normal in size with greater than 50% respiratory variability, suggesting right atrial pressure of 3 mmHg. 7. Comparison(s): No significant change from prior study. Prior images reviewed side by side. Conclusion(s)/Recommendation(s): Normal biventricular function without evidence of hemodynamically significant valvular heart disease. FINDINGS Left  Ventricle: Left ventricular ejection fraction, by estimation, is 55 to 60%. The left ventricle has normal function. The left ventricle has no regional wall motion abnormalities. Definity contrast agent was  Risk Assessment/Calculations/Metrics:              Physical Exam:    VS:  BP 114/68   Pulse (!) 56   Ht '5\' 5"'$  (1.651 m)   Wt 299 lb (135.6 kg)   SpO2 94%   BMI 49.76 kg/m     Wt Readings from Last 3 Encounters:  06/12/22 299 lb (135.6 kg)  05/18/22 295 lb (133.8 kg)  05/03/22 298 lb 12.8 oz (135.5 kg)    Physical Exam  GEN: Obese, in no acute distress  Neck: no JVD, carotid bruits, or masses Cardiac:RRR; no murmurs, rubs, or gallops  Respiratory:  decreased  breath sounds throughout GI: soft, nontender, nondistended, + BS Ext: without cyanosis, clubbing, or edema, Good distal pulses bilaterally Neuro:  Alert and Oriented x 3, Psych: euthymic mood, full affect       ASSESSMENT & PLAN:   No problem-specific Assessment & Plan notes found for this encounter.   Chest pain with normal coronaries cath 09/2018, chest pain 02/2022 felt to be MSK. Chest pain difficult to sort out but worse with exertion and multiple CV risk factors. Will order 2 day lexi.  SVT ablation 2015 recent Zio no afib and he has a Chad machine that he brought in and I tried to help guide him in using it.  OSA on CPAP  Morbid obesity exercise and weight loss discussed.  DM per PCP  Tobacco abuse smoking cessation discussed      Shared Decision Making/Informed Consent The risks [chest pain, shortness of breath, cardiac arrhythmias, dizziness, blood pressure fluctuations, myocardial infarction, stroke/transient ischemic attack, nausea, vomiting, allergic reaction, radiation exposure, metallic taste sensation and life-threatening complications (estimated to be 1 in 10,000)], benefits (risk stratification, diagnosing coronary artery disease, treatment guidance) and alternatives of a nuclear stress test  were discussed in detail with Mr. Mccants and he agrees to proceed.   Dispo:  No follow-ups on file.   Medication Adjustments/Labs and Tests Ordered: Current medicines are reviewed at length with the patient today.  Concerns regarding medicines are outlined above.  Tests Ordered: Orders Placed This Encounter  Procedures   NM Myocar Multi W/Spect W/Wall Motion / EF   Referral to Nutrition and Diabetes Services   EKG 12-Lead   Medication Changes: No orders of the defined types were placed in this encounter.  Signed, Ermalinda Barrios, PA-C  06/12/2022 1:58 PM    [address], [place], [place]  [address] Phone: [phone]; Fax: [phone]

## 2022-06-12 NOTE — Telephone Encounter (Signed)
Pt c/o of Chest Pain: STAT if CP now or developed within 24 hours  1. Are you having CP right now? Yes, upper left side closer to shoulder   2. Are you experiencing any other symptoms (ex. SOB, nausea, vomiting, sweating)? Gets off balance every now and again   3. How long have you been experiencing CP? Has been going on for about a year   4. Is your CP continuous or coming and going? Coming and going   5. Have you taken Nitroglycerin? No  ?  Patient called to return Sarah's call stating he has Chad mobile and it is reporting he is still in Afib.

## 2022-06-12 NOTE — Telephone Encounter (Signed)
I spoke with the pt and he denies having chest pain now but he has been having worsening CP over the past several weeks... 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   I advised the pt that if he is having active chest pain he should not drive and call EMS or have someone take him to the ED and he verbalized understanding and agreed.   He will see Gerrianne Scale PA today in [place] at 12:30 pm... he declined an appt with Dr Harrington Challenger 06/15/22 due to his job and unable to take time off.

## 2022-06-12 NOTE — Telephone Encounter (Signed)
Patient called in to return call and reported recent chest pain. He states he has been having it off and on. He does not sound in distress and reports some SOB but states he has COPD. Will forward his information to Dr. Alan Ripper nurse to review and discuss with Dr. Harrington Challenger. Advised he go to ED for worsening CP, increased SOB or palpitations.  Patient is agreeable to this.

## 2022-06-12 NOTE — Patient Instructions (Signed)
Medication Instructions:  Your physician recommends that you continue on your current medications as directed. Please refer to the Current Medication list given to you today.   Labwork: None today  Testing/Procedures: Your physician has requested that you have a lexiscan myoview (2 DAY STUDY) . For further information please visit [email or web address]. Please follow instruction sheet, as given.   Follow-Up: 6 months with Dr.Ross  Any Other Special Instructions Will Be Listed Below (If Applicable).   You have been referred to a  Nutritionist . They will call you to schedule an appointment.   If you need a refill on your cardiac medications before your next appointment, please call your pharmacy.

## 2022-06-15 ENCOUNTER — Encounter (HOSPITAL_COMMUNITY): Payer: No Typology Code available for payment source

## 2022-06-15 ENCOUNTER — Encounter (HOSPITAL_COMMUNITY): Payer: Self-pay

## 2022-06-16 ENCOUNTER — Other Ambulatory Visit (HOSPITAL_COMMUNITY): Payer: No Typology Code available for payment source

## 2022-06-19 ENCOUNTER — Encounter (HOSPITAL_COMMUNITY): Payer: No Typology Code available for payment source

## 2022-06-19 ENCOUNTER — Ambulatory Visit (HOSPITAL_COMMUNITY): Admission: RE | Admit: 2022-06-19 | Payer: Self-pay | Source: Ambulatory Visit

## 2022-06-20 ENCOUNTER — Encounter (HOSPITAL_BASED_OUTPATIENT_CLINIC_OR_DEPARTMENT_OTHER)
Admission: RE | Admit: 2022-06-20 | Discharge: 2022-06-20 | Disposition: A | Discharge status: Home / Self Care | Payer: No Typology Code available for payment source | Source: Ambulatory Visit | Attending: Physician Assistant | Admitting: Physician Assistant

## 2022-06-20 ENCOUNTER — Ambulatory Visit (HOSPITAL_COMMUNITY)
Admission: RE | Admit: 2022-06-20 | Discharge: 2022-06-20 | Disposition: A | Discharge status: Home / Self Care | Payer: No Typology Code available for payment source | Source: Ambulatory Visit | Attending: Internal Medicine | Admitting: Internal Medicine

## 2022-06-20 ENCOUNTER — Ambulatory Visit (HOSPITAL_COMMUNITY): Payer: Self-pay

## 2022-06-20 DIAGNOSIS — R079 Chest pain, unspecified: Secondary | ICD-10-CM

## 2022-06-20 MED ORDER — SODIUM CHLORIDE FLUSH 0.9 % IV SOLN
INTRAVENOUS | Status: AC
  Administered 2022-06-20: 10 mL via INTRAVENOUS
  Filled 2022-06-20: qty 10

## 2022-06-20 MED ORDER — REGADENOSON 0.4 MG/5ML IV SOLN
INTRAVENOUS | Status: AC
  Administered 2022-06-20: 0.4 mg via INTRAVENOUS
  Filled 2022-06-20: qty 5

## 2022-06-20 MED ORDER — TECHNETIUM TC 99M TETROFOSMIN IV KIT
30.0000 | PACK | Freq: Once | INTRAVENOUS | Status: AC | PRN
  Administered 2022-06-20: 28.4 via INTRAVENOUS

## 2022-06-21 ENCOUNTER — Ambulatory Visit (HOSPITAL_COMMUNITY)
Admission: RE | Admit: 2022-06-21 | Discharge: 2022-06-21 | Disposition: A | Discharge status: Home / Self Care | Payer: No Typology Code available for payment source | Source: Ambulatory Visit | Attending: Physician Assistant | Admitting: Physician Assistant

## 2022-06-21 ENCOUNTER — Encounter (HOSPITAL_COMMUNITY): Payer: Self-pay

## 2022-06-21 LAB — NM MYOCAR MULTI W/SPECT W/WALL MOTION / EF
Base ST Depression (mm): 0 mm
LV dias vol: 119 mL (ref 62–150)
LV sys vol: 43 mL
Nuc Stress EF: 63 %
Peak HR: 109 {beats}/min
RATE: 0.4
Rest HR: 56 {beats}/min
Rest Nuclear Isotope Dose: 31.3 mCi
SDS: 0
SRS: 4
SSS: 4
ST Depression (mm): 0 mm
Stress Nuclear Isotope Dose: 28.4 mCi
TID: 0.73

## 2022-06-21 MED ORDER — TECHNETIUM TC 99M TETROFOSMIN IV KIT
30.0000 | PACK | Freq: Once | INTRAVENOUS | Status: AC | PRN
  Administered 2022-06-21: 30.3 via INTRAVENOUS

## 2022-06-22 ENCOUNTER — Encounter (HOSPITAL_COMMUNITY): Payer: No Typology Code available for payment source

## 2022-06-26 ENCOUNTER — Telehealth: Payer: Self-pay | Admitting: Pulmonary Disease

## 2022-06-26 NOTE — Telephone Encounter (Signed)
Called and spoke to patient. I did notify him that we are out of stiolto samples at the moment but once we get more samples in I will give him a call. He voiced understanding and states he picked up an inhaler this morning. He is no longer having SOB or dizziness but states he will call back if this starts again. Nothing further needed at this time.

## 2022-06-28 ENCOUNTER — Telehealth: Payer: Self-pay | Admitting: Internal Medicine

## 2022-06-28 NOTE — Telephone Encounter (Signed)
Pt called to report that his HR has been fluctuating on his pulse ox at home... it can go from the 40's to 160's lasting just a few seconds and gets better on it's own... he has been noticing this since the end of September for several days. No dizziness, and his SOB it still as it has been with his COPD... he has no new edema or chest pain.... no new meds except an MDI from Pulmonary but he stopped it for several days and did not seem to help his heart racing.   Pt will continue to monitor and if he develops any worsening symptoms he will call EMS.... I will forward to Dr Lovena Le... he has an appt with him on 07/04/22 to see if any recommendations prior to his appt.

## 2022-06-28 NOTE — Telephone Encounter (Signed)
STAT if HR is under 50 or over 120 (normal HR is 60-100 beats per minute)  What is your heart rate?   HR 56  Do you have a log of your heart rate readings (document readings)?   Yes  Do you have any other symptoms?  No  Patient stating he has been having fluctuating HR readings for the last 3-4 days.  Patient stated his oxygen has been good.  Patient stated the episodes start after 12 noon, today it started at 12:30 pm.

## 2022-06-29 ENCOUNTER — Telehealth: Payer: Self-pay | Admitting: Pulmonary Disease

## 2022-06-30 NOTE — Telephone Encounter (Signed)
Called and spoke with patient. He stated that he was returning a call from our office but he listened to his VM and does not need anything at the moment.

## 2022-07-02 NOTE — Telephone Encounter (Signed)
Followup as scheduled. GT

## 2022-07-03 NOTE — Telephone Encounter (Signed)
Pt called back and asked patient how his symptoms were over the weekend?  Pt stated he did not have as many HR fluctuations.   Pt advised to come into see Dr. Lovena Le tomorrow, and we will discuss further.

## 2022-07-04 ENCOUNTER — Ambulatory Visit: Payer: Self-pay | Attending: Internal Medicine | Admitting: Internal Medicine

## 2022-07-04 ENCOUNTER — Encounter: Payer: Self-pay | Admitting: Internal Medicine

## 2022-07-04 VITALS — BP 138/72 | HR 60 | Ht 65.0 in | Wt 299.0 lb

## 2022-07-04 DIAGNOSIS — R0600 Dyspnea, unspecified: Secondary | ICD-10-CM

## 2022-07-04 DIAGNOSIS — I471 Supraventricular tachycardia, unspecified: Secondary | ICD-10-CM

## 2022-07-04 MED ORDER — FLECAINIDE ACETATE 50 MG PO TABS: 50.0000 mg | ORAL_TABLET | Freq: Two times a day (BID) | ORAL | 3 refills | Status: DC

## 2022-07-04 NOTE — Progress Notes (Signed)
HPI [name] returns for followup of palpitations and tachy-brady syndrome. He had SVT in 2015 and underwent ablation. Over the past few weeks he has had more palpitations. He wore a cardiac monitor a couple of months ago which showed sinus brady and brief episodes of atrial tachycardia lasting seconds. Over the past month he notes that the palpitations are lasting minutes at a time. No syncope. He did have fairly significant sinus brady when her wore the heart monitor.  No Known Allergies   Current Outpatient Medications  Medication Sig Dispense Refill   atorvastatin (LIPITOR) 40 MG tablet TAKE 1 TABLET DAILY AT 6PM 90 tablet 3   doxazosin (CARDURA) 1 MG tablet Take 1 tablet (1 mg total) by mouth daily. 90 tablet 1   loratadine (CLARITIN) 10 MG tablet Take 1 tablet (10 mg total) by mouth daily. 30 tablet 11   metFORMIN (GLUCOPHAGE) 500 MG tablet TAKE ONE TABLET BY MOUTH TWICE DAILY 180 tablet 0   omeprazole (PRILOSEC) 40 MG capsule TAKE ONE (1) CAPSULE EACH DAY 90 capsule 1   albuterol (PROVENTIL) (2.5 MG/3ML) 0.083% nebulizer solution Take 3 mLs (2.5 mg total) by nebulization every 6 (six) hours as needed for wheezing or shortness of breath. (Patient not taking: Reported on 07/04/2022) 150 mL 1   albuterol (VENTOLIN HFA) 108 (90 Base) MCG/ACT inhaler Inhale 2 puffs into the lungs every 6 (six) hours as needed for wheezing or shortness of breath. (Patient not taking: Reported on 07/04/2022) 8 g 2   Tiotropium Bromide-Olodaterol (STIOLTO RESPIMAT) 2.5-2.5 MCG/ACT AERS Inhale 2 puffs into the lungs daily. (Patient not taking: Reported on 07/04/2022) 1 each 5   No current facility-administered medications for this visit.     Past Medical History:  Diagnosis Date   GERD (gastroesophageal reflux disease)    HCV (hepatitis C virus) DR. ZACKS   AUG 2013 VIRAL LOAD UNDETECTABLE   History of cardiac catheterization    a. 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   CHOLECYSTECTOMY     COLONOSCOPY  10/04/2012   SLF:ONE/8 SIMPLE ADENOMA, TICS, SML IH   ESOPHAGOGASTRODUODENOSCOPY N/A 09/04/2014   Procedure: ESOPHAGOGASTRODUODENOSCOPY (EGD);  Surgeon: Danie Binder, MD;  Location: [facility];  Service: Endoscopy;  Laterality: N/A;  145   GALLBLADDER SURGERY     JAW BROKEN     LIPOMA REMOVALS     SEVERAL   RIGHT/LEFT HEART CATH AND CORONARY ANGIOGRAPHY N/A 10/08/2018   Procedure: RIGHT/LEFT HEART CATH AND CORONARY ANGIOGRAPHY;  Surgeon: Martinique, Peter M, MD;  Location: [facility];  Service: Cardiovascular;  Laterality: N/A;   SUPRAVENTRICULAR TACHYCARDIA ABLATION N/A 06/22/2014   Procedure: SUPRAVENTRICULAR TACHYCARDIA ABLATION;  Surgeon: Evans Lance, MD;  Location: [facility] LAB;  Service: Cardiovascular;  Laterality: N/A;     Family History  Problem Relation Age of Onset   Emphysema Mother    COPD Mother    Early death Mother    Hyperlipidemia Father    Stroke Father    Diabetes Father  High Cholesterol Father    Colon cancer Neg Hx      Social History   Socioeconomic History   Marital status: Widowed    Spouse name: Not on file   Number of children: Not on file   Years of education: Not on file   Highest education level: Not on file  Occupational History   Occupation: concrete work    Fish farm manager: SELF-EMPLOYED  Tobacco Use   Smoking status: Every Day    Packs/day: 1.00    Years: 35.00    Total pack years: 35.00    Types: Cigarettes   Smokeless tobacco: Never   Tobacco comments:    smokes 1-1.5 packs per day  Vaping Use   Vaping Use: Never used  Substance and Sexual  Activity   Alcohol use: No   Drug use: No    Comment: in the 1980s, smoke crack. None since Dec 2011.    Sexual activity: Not Currently  Other Topics Concern   Not on file  Social History Narrative   Not on file   Social Determinants of Health   Financial Resource Strain: Not on file  Food Insecurity: Not on file  Transportation Needs: Not on file  Physical Activity: Not on file  Stress: Not on file  Social Connections: Not on file  Intimate Partner Violence: Not on file     BP 138/72   Pulse 60   Ht '5\' 5"'$  (1.651 m)   Wt 299 lb (135.6 kg)   SpO2 94%   BMI 49.76 kg/m   Physical Exam:  Morbidly obeseappearing NAD HEENT: Unremarkable Neck:  No JVD, no thyromegally Lymphatics:  No adenopathy Back:  No CVA tenderness Lungs:  Clear with no wheezes HEART:  IRegular rate rhythm, no murmurs, no rubs, no clicks Abd:  soft, positive bowel sounds, no organomegally, no rebound, no guarding Ext:  2 plus pulses, no edema, no cyanosis, no clubbing Skin:  No rashes no nodules Neuro:  CN II through XII intact, motor grossly intact  EKG - nsr with trigeminal PAC's  Assess/Plan:  Symptomatic tachy-brady - I will start low dose flecainide. No indication for a ppm yet. Hopefully the flecainide will control his symptoms.  Obesity - he is encouraged to lose weight.  HTN - his bp is ok today.  Copd - he notes that his symptoms worsened with a new inhaler.  Carleene Overlie Dewaun Kinzler,MD

## 2022-07-04 NOTE — Patient Instructions (Addendum)
Medication Instructions:  Your physician has recommended you make the following change in your medication: Start TAKING: Flecainide (50 MG)  You will take one tablet ( 50 mg ) by mouth twice a day.  Lab Work: None ordered.  If you have labs (blood work) drawn today and your tests are completely normal, you will receive your results only by: [place] (if you have MyChart) OR A paper copy in the mail If you have any lab test that is abnormal or we need to change your treatment, we will call you to review the results.  Testing/Procedures: None ordered.  Follow-Up:  PLEASE SCHEDULE A NURSE VISIT FOR A 12 LEAD EKG FOR TWO WEEKS FROM TODAY.  PLEASE SCHEDULE A FOLLOW UP APPOINTMENT WITH DR Lovena Le IN 4 MONTHS.  Flecainide Tablets What is this medication? FLECAINIDE (FLEK a nide) prevents and treats a fast or irregular heartbeat (arrhythmia). It is often used to treat a type of arrhythmia known as AFib (atrial fibrillation). It works by slowing down overactive electric signals in the heart, which stabilizes your heart rhythm. It belongs to a group of medications called antiarrhythmics. This medicine may be used for other purposes; ask your health care provider or pharmacist if you have questions. COMMON BRAND NAME(S): Tambocor What should I tell my care team before I take this medication? They need to know if you have any of these conditions: Abnormal levels of potassium in the blood Heart disease including heart rhythm and heart rate problems Kidney or liver disease Recent heart attack An unusual or allergic reaction to flecainide, local anesthetics, other medications, foods, dyes, or preservatives Pregnant or trying to get pregnant Breast-feeding How should I use this medication? Take this medication by mouth with a glass of water. Follow the directions on the prescription label. You can take this medication with or without food. Take your doses at regular intervals. Do not take  your medication more often than directed. Do not stop taking this medication suddenly. This may cause serious, heart-related side effects. If your care team wants you to stop the medication, the dose may be slowly lowered over time to avoid any side effects. Talk to your care team regarding the use of this medication in children. While this medication may be prescribed for children as Julizza Sassone as 1 year of age for selected conditions, precautions do apply. Overdosage: If you think you have taken too much of this medicine contact a poison control center or emergency room at once. NOTE: This medicine is only for you. Do not share this medicine with others. What if I miss a dose? If you miss a dose, take it as soon as you can. If it is almost time for your next dose, take only that dose. Do not take double or extra doses. What may interact with this medication? Do not take this medication with any of the following: Amoxapine Arsenic trioxide Certain antibiotics like clarithromycin, erythromycin, gatifloxacin, gemifloxacin, levofloxacin, moxifloxacin, sparfloxacin, or troleandomycin Certain antidepressants called tricyclic antidepressants like amitriptyline, imipramine, or nortriptyline Certain medications to control heart rhythm like disopyramide, encainide, moricizine, procainamide, propafenone, and quinidine Cisapride Delavirdine Droperidol Haloperidol [place] Imatinib Levomethadyl Maprotiline Medications for malaria like chloroquine and halofantrine Pentamidine Phenothiazines like chlorpromazine, mesoridazine, prochlorperazine, thioridazine Pimozide Quinine Ranolazine Ritonavir Sertindole This medication may also interact with the following: Cimetidine Dofetilide Medications for angina or high blood pressure Medications to control heart rhythm like amiodarone and digoxin Ziprasidone This list may not describe all possible interactions. Give your health care provider  a list of all the  medicines, herbs, non-prescription drugs, or dietary supplements you use. Also tell them if you smoke, drink alcohol, or use illegal drugs. Some items may interact with your medicine. What should I watch for while using this medication? Visit your care team for regular checks on your progress. Because your condition and the use of this medication carries some risk, it is a good idea to carry an identification card, necklace or bracelet with details of your condition, medications, and care team. Check your blood pressure and pulse rate regularly. Ask your care team what your blood pressure and pulse rate should be, and when you should contact them. Your care team also may schedule regular blood tests and electrocardiograms to check your progress. You may get drowsy or dizzy. Do not drive, use machinery, or do anything that needs mental alertness until you know how this medication affects you. Do not stand or sit up quickly, especially if you are an older patient. This reduces the risk of dizzy or fainting spells. Alcohol can make you more dizzy, increase flushing and rapid heartbeats. Avoid alcoholic drinks. What side effects may I notice from receiving this medication? Side effects that you should report to your care team as soon as possible: Allergic reactions--skin rash, itching, hives, swelling of the face, lips, tongue, or throat Heart failure--shortness of breath, swelling of the ankles, feet, or hands, sudden weight gain, unusual weakness or fatigue Heart rhythm changes--fast or irregular heartbeat, dizziness, feeling faint or lightheaded, chest pain, trouble breathing Liver injury--right upper belly pain, loss of appetite, nausea, light-colored stool, dark yellow or brown urine, yellowing skin or eyes, unusual weakness or fatigue Side effects that usually do not require medical attention (report to your care team if they continue or are bothersome): Blurry  vision Constipation Dizziness Fatigue Headache Nausea Tremors or shaking This list may not describe all possible side effects. Call your doctor for medical advice about side effects. You may report side effects to FDA at 1-800-FDA-1088. Where should I keep my medication? Keep out of the reach of children and pets. Store at room temperature between 15 and 30 degrees C (59 and 86 degrees F). Protect from light. Keep container tightly closed. Throw away any unused medication after the expiration date. NOTE: This sheet is a summary. It may not cover all possible information. If you have questions about this medicine, talk to your doctor, pharmacist, or health care provider.  2023 Elsevier/Gold Standard (2007-11-02 00:00:00)

## 2022-07-11 ENCOUNTER — Other Ambulatory Visit: Payer: Self-pay | Admitting: Family Medicine

## 2022-07-18 ENCOUNTER — Ambulatory Visit: Payer: No Typology Code available for payment source

## 2022-07-18 ENCOUNTER — Telehealth: Payer: Self-pay | Admitting: Internal Medicine

## 2022-07-18 NOTE — Telephone Encounter (Signed)
Pt called stated he has symptoms of nausea this morning.  He stated he is NOT having SOB, dizziness, or chest pains, just nausea.    Pt requested an appointment tomorrow morning at 900 am for his EKG / RN VISIT.    Pt appointment for today moved from 10/24 to 10/25 at 900 am.

## 2022-07-18 NOTE — Telephone Encounter (Signed)
Patient states he is not feeling and needs to reschedule his nurse visit today.

## 2022-07-19 ENCOUNTER — Ambulatory Visit: Payer: No Typology Code available for payment source | Attending: Internal Medicine | Admitting: Internal Medicine

## 2022-07-19 VITALS — BP 110/60 | HR 64 | Wt 293.0 lb

## 2022-07-19 DIAGNOSIS — I471 Supraventricular tachycardia, unspecified: Secondary | ICD-10-CM

## 2022-07-19 NOTE — Patient Instructions (Addendum)
   Nurse Visit   Date of Encounter: 07/19/2022 ID: Thomas Ball, DOB 12/12/1960, MRN [id number]  PCP:  [name], [place] [facility] Cardiologist:  Dorris Carnes, MD Electrophysiologist:  Cristopher Peru, MD      Visit Details   VS:  BP 110/60 (BP Location: Right Arm, Patient Position: Sitting)   Pulse 64   Wt 293 lb 0.4 oz (132.9 kg)   SpO2 98%   BMI 48.76 kg/m  , BMI Body mass index is 48.76 kg/m.  Wt Readings from Last 3 Encounters:  07/19/22 293 lb 0.4 oz (132.9 kg)  07/04/22 299 lb (135.6 kg)  06/12/22 299 lb (135.6 kg)     Reason for visit: New Flecainide start / RN - EKG Visit  Performed today: Vitals, EKG, Provider consulted:[facility], Dr. Olin Pia, and Education Changes (medications, testing, etc.) : Flecainide 50 mg, Twice day. Length of Visit: 30 minutes    Medications Adjustments/Labs and Tests Ordered:  Pt started on Flecainide 50 Mg, BID by Dr. Cristopher Peru on 07/04/2022.  [facility] into [facility] [address] for RN visit and EKG check.  Pt vital signs within normal limits. Pt stated he felt some PAC's this morning and this past Sunday morning.  EKG completed and taken to DOD, Dr. Olin Pia for review.  No new orders per Dr. Caryl Comes, and EKG was comparable to EKG on 07/04/2022.  Will make Dr. Lovena Le aware of Pt tolerating Flecainide well, and PAC's were present on Pt EKG;  HR was 67 bpm.     Signed, Varney Daily, RN  07/19/2022 9:31 AM

## 2022-07-21 ENCOUNTER — Telehealth: Payer: Self-pay | Admitting: Internal Medicine

## 2022-07-21 NOTE — Telephone Encounter (Signed)
Pt c/o medication issue:  1. Name of Medication: Forgot to take all morning medicine  2. How are you currently taking this medication (dosage and times per day)?   3. Are you having a reaction (difficulty breathing--STAT)?   4. What is your medication issue? Off balance, and chest was hurting , chest and  feeling better with his balance- he wanted to know, by missing this medicine, would t make him feel this way?i

## 2022-07-21 NOTE — Telephone Encounter (Signed)
Called Pt twice no answer.  Called daughter Jonelle Sidle no answer.    Pt answered third call.  Pt stated he forgot to take his Flecainide this morning.    Pt was symptomatic, but took the Flecainide as soon as he realized he missed the dose.  When I spoke to Pt, his symptoms had gone away.  He stated he felt better, and did not feel a provider visit was needed.    He will take his second dose late tonight, and will adjust his morning dose accordingly.   Pt advised to seek medical care  / ER if his symptoms return over the weekend. Pt understood.

## 2022-08-10 ENCOUNTER — Telehealth: Payer: Self-pay | Admitting: Internal Medicine

## 2022-08-10 NOTE — Telephone Encounter (Signed)
Pt c/o medication issue:  1. Name of Medication:   flecainide (TAMBOCOR) 50 MG tablet   2. How are you currently taking this medication (dosage and times per day)? As prescribed  3. Are you having a reaction (difficulty breathing--STAT)?   No  4. What is your medication issue?   Patient stated he is following-up on doing a comparison of how he is doing on this medication versus his previous medication.

## 2022-08-14 NOTE — Telephone Encounter (Signed)
Pt called back again to follow up on message received 11/16;    Pt states that overall he is doing well.  When first waking up from sleep the other week, has had a HR reading of 25, and said he got out of bed and walked around the house to get his HR up.  He also states that on one occasion he has had a HR reading of 100, again while in bed.    Pt admits to taking a late dose of flecainide, on a couple occasions.  Pt educated on the importance of consistency / medication time management when taking his Flecainide.  Taking the medication later or off time, can result in varied HR's.  Pt stated since he has been more on schedule with his medication, his HR seems to be doing better.   Pt advised to let us know if he has any highs or low HR's this week, to call [facility].  If we are closed to seek medical care at the nearest ER.  Pt understood, and follow up required at this time.

## 2022-08-14 NOTE — Telephone Encounter (Signed)
Pt called to inquire on how he was doing on Flecainide? Pt did not answer, left message for him to call me back.

## 2022-08-16 ENCOUNTER — Other Ambulatory Visit: Payer: Self-pay | Admitting: Family Medicine

## 2022-09-04 ENCOUNTER — Other Ambulatory Visit: Payer: Self-pay | Admitting: *Deleted

## 2022-09-04 DIAGNOSIS — Z122 Encounter for screening for malignant neoplasm of respiratory organs: Secondary | ICD-10-CM

## 2022-09-04 DIAGNOSIS — F17210 Nicotine dependence, cigarettes, uncomplicated: Secondary | ICD-10-CM

## 2022-09-04 DIAGNOSIS — Z87891 Personal history of nicotine dependence: Secondary | ICD-10-CM

## 2022-09-06 ENCOUNTER — Encounter: Payer: Self-pay | Admitting: *Deleted

## 2022-09-07 ENCOUNTER — Other Ambulatory Visit: Payer: Self-pay | Admitting: Family Medicine

## 2022-09-12 ENCOUNTER — Telehealth: Payer: Self-pay | Admitting: Internal Medicine

## 2022-09-12 NOTE — Telephone Encounter (Signed)
Patient called in to report a HR of 24 this morning. He states his rate is now in the 70's. He measured it with a pulse ox. He denies CP or worsening SOB with the low HR and states he got up and started walking to get his rate back up.  Advised patient if this reoccurs he needs to go to the nearest Emergency Dept or call EMS. Patient verbalized understanding. He also agreed to come in to the office for further evaluation and is schedule on 09/13/22.

## 2022-09-12 NOTE — Telephone Encounter (Signed)
STAT if HR is under 50 or over 120 (normal HR is 60-100 beats per minute)  What is your heart rate? 24, 30s  Do you have a log of your heart rate readings (document readings)?    Do you have any other symptoms? Headaches, loses balance

## 2022-09-12 NOTE — Progress Notes (Unsigned)
Office Visit    Patient Name: Thomas Ball Date of Encounter: 09/12/2022  Primary Care Provider:  [name], DO Primary Cardiologist:  Dorris Carnes, MD Primary Electrophysiologist: Cristopher Peru, MD  Chief Complaint    Thomas Ball is a 61 y.o. male with PMH of nonobstructive CAD HTN, HLD, PSVT s/p SVT ablation in 2015, OSA, tobacco use, DM, cirrhosis, pancreatitis who presents today for complaint of bradycardia.  Past Medical History    Past Medical History:  Diagnosis Date   GERD (gastroesophageal reflux disease)    HCV (hepatitis C virus) DR. ZACKS   AUG 2013 VIRAL LOAD UNDETECTABLE   History of cardiac catheterization    a. 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   CHOLECYSTECTOMY     COLONOSCOPY  10/04/2012   SLF:ONE/8 SIMPLE ADENOMA, TICS, SML IH   ESOPHAGOGASTRODUODENOSCOPY N/A 09/04/2014   Procedure: ESOPHAGOGASTRODUODENOSCOPY (EGD);  Surgeon: Danie Binder, MD;  Location: [facility];  Service: Endoscopy;  Laterality: N/A;  145   GALLBLADDER SURGERY     JAW BROKEN     LIPOMA REMOVALS     SEVERAL   RIGHT/LEFT HEART CATH AND CORONARY ANGIOGRAPHY N/A 10/08/2018   Procedure: RIGHT/LEFT HEART CATH AND CORONARY ANGIOGRAPHY;  Surgeon: Martinique, Peter M, MD;  Location: [facility];  Service: Cardiovascular;  Laterality: N/A;   SUPRAVENTRICULAR TACHYCARDIA ABLATION N/A 06/22/2014   Procedure: SUPRAVENTRICULAR TACHYCARDIA ABLATION;  Surgeon: Evans Lance, MD;  Location: [facility] CATH LAB;  Service: Cardiovascular;  Laterality: N/A;    Allergies  No Known Allergies  History of Present Illness    Thomas Ball  is a 61 year old male with the above mention past  medical history who presents today for complaint of bradycardia.  Mr. Heintzelman was initially seen 04/21/2014 for consultation of tachycardia.  EKG was completed showing junctional tachycardia, troponins were also normal.  Chest CT was completed and ruled out pulmonary embolism.  He underwent Lexiscan Myoview that showed no evidence of ischemia.  2D echo was also completed with normal LV function.  He was referred to EP and underwent SVT ablation on 05/2014.  He was seen in follow-up 07/2017 with complaints of chest pain and underwent Lexiscan that was negative for ischemia.  He presented to the ED on 12/thousand 19 with continued chest discomfort and troponins were negative BNP was mildly elevated and ECG showed no acute changes.  He was seen in follow-up by Bernerd Pho, [employer] on 09/2018 with no change to intermittent chest discomfort.  He underwent LHC performed by Dr. Martinique on 09/2018 that revealed normal coronary anatomy with normal LV function and LV filling pressures.  He was noted to have mild pulmonary hypertension and medical management was recommended.    He was seen on 03/2022 with chest pain again and 2D echo was completed showing EF of 55-60% with moderate LAE, normal RV, mild RAE with no significant tricuspid regurg with pulmonary hypertension noted.  Patient's chest pain was felt to be musculoskeletal in nature brought on by stress.  He was seen in follow-up 04/2022 with complaint of elevated and slow heart rates.  He wore event monitor that showed minimum heart rate of 40 overnight with no pauses or A-fib with short run of PAT/SVT.  He was seen by Dr. Lovena Le 11/04/2021 with termination of symptomatic tacky bradycardia and flecainide was started.      Since last being seen in the office patient reports***.  Patient denies chest pain, palpitations, dyspnea, PND, orthopnea, nausea, vomiting, dizziness, syncope, edema, weight gain, or early satiety.     ***Notes: See phone note Home Medications     Current Outpatient Medications  Medication Sig Dispense Refill   albuterol (PROVENTIL) (2.5 MG/3ML) 0.083% nebulizer solution Take 3 mLs (2.5 mg total) by nebulization every 6 (six) hours as needed for wheezing or shortness of breath. (Patient not taking: Reported on 07/04/2022) 150 mL 1   albuterol (VENTOLIN HFA) 108 (90 Base) MCG/ACT inhaler Inhale 2 puffs into the lungs every 6 (six) hours as needed for wheezing or shortness of breath. (Patient not taking: Reported on 07/04/2022) 8 g 2   atorvastatin (LIPITOR) 40 MG tablet Take 1 tablet (40 mg total) by mouth daily at 6 PM. (NEEDS TO BE SEEN BEFORE NEXT REFILL) 30 tablet 0   doxazosin (CARDURA) 1 MG tablet Take 1 tablet (1 mg total) by mouth daily. 90 tablet 1   flecainide (TAMBOCOR) 50 MG tablet Take 1 tablet (50 mg total) by mouth 2 (two) times daily. 180 tablet 3   loratadine (CLARITIN) 10 MG tablet Take 1 tablet (10 mg total) by mouth daily. 30 tablet 11   metFORMIN (GLUCOPHAGE) 500 MG tablet TAKE ONE TABLET BY MOUTH TWICE DAILY 180 tablet 0   omeprazole (PRILOSEC) 40 MG capsule TAKE ONE (1) CAPSULE EACH DAY 90 capsule 1   Tiotropium Bromide-Olodaterol (STIOLTO RESPIMAT) 2.5-2.5 MCG/ACT AERS Inhale 2 puffs into the lungs daily. (Patient not taking: Reported on 07/04/2022) 1 each 5   No current facility-administered medications for this visit.     Review of Systems  Please see the history of present illness.    (+)*** (+)***  All other systems reviewed and are otherwise negative except as noted above.  Physical Exam    Wt Readings from Last 3 Encounters:  07/19/22 293 lb 0.4 oz (132.9 kg)  07/04/22 299 lb (135.6 kg)  06/12/22 299 lb (135.6 kg)   FI:EPPIR were no vitals filed for this visit.,There is no height or weight on file to calculate BMI.  Constitutional:      Appearance: Healthy appearance. Not in distress.  Neck:     Vascular: JVD normal.  Pulmonary:     Effort: Pulmonary effort is normal.     Breath sounds: No  wheezing. No rales. Diminished in the bases Cardiovascular:     Normal rate. Regular rhythm. Normal S1. Normal S2.      Murmurs: There is no murmur.  Edema:    Peripheral edema absent.  Abdominal:     Palpations: Abdomen is soft non tender. There is no hepatomegaly.  Skin:    General: Skin is warm and dry.  Neurological:     General: No focal deficit present.     Mental Status: Alert and oriented to person, place and time.     Cranial Nerves: Cranial nerves are intact.  EKG/LABS/Other Studies Reviewed    ECG personally reviewed by me today - ***  Risk Assessment/Calculations:   {Does this patient have ATRIAL FIBRILLATION?[phone]}        Lab Results  Component Value Date   WBC 5.6 04/28/2022   HGB 14.5  04/28/2022   HCT 42.6 04/28/2022   MCV 93 04/28/2022   PLT 168 04/28/2022   Lab Results  Component Value Date   CREATININE 0.77 04/28/2022   BUN 12 04/28/2022   NA 138 04/28/2022   K 4.5 04/28/2022   CL 102 04/28/2022   CO2 24 04/28/2022   Lab Results  Component Value Date   ALT 21 10/24/2021   AST 18 10/24/2021   ALKPHOS 25 (L) 10/24/2021   BILITOT 1.0 10/24/2021   Lab Results  Component Value Date   CHOL 109 10/24/2021   HDL 39 (L) 10/24/2021   LDLCALC 57 10/24/2021   TRIG 60 10/24/2021   CHOLHDL 2.8 10/24/2021    Lab Results  Component Value Date   HGBA1C 6.2 (H) 04/17/2022    Assessment & Plan    1.  Bradycardia: -Patient currently followed by EP and was started on flecainide 50 mg twice daily during previous visit. -Today patient reports***   2.  Nonobstructive CAD: -Most recent ischemic evaluation done by LHC in 2020 that revealed revealed normal coronary anatomy with normal LV function and LV filling pressures.  3.  History of SVT: -s/p ablation performed 2015 by Dr. Lovena Le.  4.  Hyperlipidemia: -Patient's LDL cholesterol was*** -Continue Lipitor 40 mg daily       Disposition: Follow-up with Dorris Carnes, MD or APP in ***  months {Are you ordering a CV Procedure (e.g. stress test, cath, DCCV, TEE, etc)?   Press F2        :[id number]}   Medication Adjustments/Labs and Tests Ordered: Current medicines are reviewed at length with the patient today.  Concerns regarding medicines are outlined above.   Signed, Mable Fill, Marissa Nestle, NP 09/12/2022, 1:58 PM [facility]  Note:  This document was prepared using Dragon voice recognition software and may include unintentional dictation errors.

## 2022-09-13 ENCOUNTER — Encounter: Payer: Self-pay | Admitting: Nurse Practitioner

## 2022-09-13 ENCOUNTER — Ambulatory Visit: Payer: No Typology Code available for payment source | Attending: Nurse Practitioner | Admitting: Nurse Practitioner

## 2022-09-13 VITALS — BP 120/60 | HR 64 | Ht 65.0 in | Wt 300.0 lb

## 2022-09-13 DIAGNOSIS — I2510 Atherosclerotic heart disease of native coronary artery without angina pectoris: Secondary | ICD-10-CM

## 2022-09-13 DIAGNOSIS — E1169 Type 2 diabetes mellitus with other specified complication: Secondary | ICD-10-CM

## 2022-09-13 DIAGNOSIS — I471 Supraventricular tachycardia, unspecified: Secondary | ICD-10-CM

## 2022-09-13 DIAGNOSIS — E785 Hyperlipidemia, unspecified: Secondary | ICD-10-CM

## 2022-09-13 DIAGNOSIS — R001 Bradycardia, unspecified: Secondary | ICD-10-CM

## 2022-09-13 NOTE — Patient Instructions (Signed)
Medication Instructions:  Your physician recommends that you continue on your current medications as directed. Please refer to the Current Medication list given to you today.  *If you need a refill on your cardiac medications before your next appointment, please call your pharmacy*   Follow-Up: At [facility], you and your health needs are our priority.  As part of our continuing mission to provide you with exceptional heart care, we have created designated Provider Care Teams.  These Care Teams include your primary Cardiologist (physician) and Advanced Practice Providers (APPs -  Physician Assistants and Nurse Practitioners) who all work together to provide you with the care you need, when you need it.   Your next appointment:   As scheduled   Important Information About Sugar       

## 2022-09-17 ENCOUNTER — Other Ambulatory Visit: Payer: Self-pay | Admitting: Family Medicine

## 2022-09-17 DIAGNOSIS — E119 Type 2 diabetes mellitus without complications: Secondary | ICD-10-CM

## 2022-09-19 ENCOUNTER — Telehealth (INDEPENDENT_AMBULATORY_CARE_PROVIDER_SITE_OTHER): Payer: Self-pay | Admitting: Family

## 2022-09-19 ENCOUNTER — Encounter: Payer: Self-pay | Admitting: Family

## 2022-09-19 DIAGNOSIS — J441 Chronic obstructive pulmonary disease with (acute) exacerbation: Secondary | ICD-10-CM

## 2022-09-19 MED ORDER — PROMETHAZINE-DM 6.25-15 MG/5ML PO SYRP: 5.0000 mL | ORAL_SOLUTION | Freq: Three times a day (TID) | ORAL | 0 refills | Status: DC | PRN

## 2022-09-19 MED ORDER — BENZONATATE 200 MG PO CAPS: 200.0000 mg | ORAL_CAPSULE | Freq: Three times a day (TID) | ORAL | 1 refills | Status: DC | PRN

## 2022-09-19 MED ORDER — DOXYCYCLINE HYCLATE 100 MG PO TABS: 100.0000 mg | ORAL_TABLET | Freq: Two times a day (BID) | ORAL | 0 refills | Status: DC

## 2022-09-19 NOTE — Progress Notes (Signed)
Virtual Visit Consent   [name], you are scheduled for a virtual visit with a [facility] provider today. Just as with appointments in the office, your consent must be obtained to participate. Your consent will be active for this visit and any virtual visit you may have with one of our providers in the next 365 days. If you have a MyChart account, a copy of this consent can be sent to you electronically.  As this is a virtual visit, video technology does not allow for your provider to perform a traditional examination. This may limit your provider's ability to fully assess your condition. If your provider identifies any concerns that need to be evaluated in person or the need to arrange testing (such as labs, EKG, etc.), we will make arrangements to do so. Although advances in technology are sophisticated, we cannot ensure that it will always work on either your end or our end. If the connection with a video visit is poor, the visit may have to be switched to a telephone visit. With either a video or telephone visit, we are not always able to ensure that we have a secure connection.  By engaging in this virtual visit, you consent to the provision of healthcare and authorize for your insurance to be billed (if applicable) for the services provided during this visit. Depending on your insurance coverage, you may receive a charge related to this service.  I need to obtain your verbal consent now. Are you willing to proceed with your visit today? ASHIR KUNZ has provided verbal consent on 09/19/2022 for a virtual visit (video or telephone). Evelina Dun, FNP  Date: 09/19/2022 12:29 PM  Virtual Visit via Video Note   I, Evelina Dun, connected with  Thomas Ball  ([id number], Jun 01, 1961) on 09/19/22 at  5:15 PM EST by a video-enabled telemedicine application and verified that I am speaking with the correct person using two identifiers.  Location: Patient: Virtual Visit Location Patient:  Home Provider: Virtual Visit Location Provider: Office/Clinic   I discussed the limitations of evaluation and management by telemedicine and the availability of in person appointments. The patient expressed understanding and agreed to proceed.    History of Present Illness: Thomas Ball is a 61 y.o. who identifies as a male who was assigned male at birth, and is being seen today for cough and congestion last week and has worsen.   HPI: Cough This is a new problem. The current episode started 1 to 4 weeks ago. The problem has been gradually worsening. The problem occurs every few minutes. The cough is Productive of purulent sputum and productive of sputum. Associated symptoms include headaches, nasal congestion, postnasal drip, rhinorrhea, shortness of breath and wheezing. Pertinent negatives include no chills, ear congestion, ear pain, fever or myalgias. Risk factors for lung disease include smoking/tobacco exposure. He has tried OTC cough suppressant for the symptoms. The treatment provided mild relief. His past medical history is significant for COPD.    Problems:  Patient Active Problem List   Diagnosis Date Noted   Hyperlipidemia associated with type 2 diabetes mellitus ([place]) 04/29/2021   Uncontrolled type 2 diabetes mellitus with hyperglycemia ([place]) 04/29/2021   Headache in front of head 12/07/2020   Panic attacks 12/07/2020   Chest congestion 12/07/2020   Pure hypercholesterolemia 01/12/2020   Dyspnea    SOB (shortness of breath) 09/15/2018   Essential hypertension 04/26/2018   OSA (obstructive sleep apnea) 03/04/2018   BPH (benign prostatic hyperplasia) 10/07/2015  EIC (epidermal inclusion cyst) 07/23/2015   Tobacco abuse 07/02/2015   SVT (supraventricular tachycardia) (HCC)    Intermittent palpitations 04/21/2014   Bradycardia 04/21/2014   Morbid obesity ([place]) 04/21/2014   Chest pain 04/20/2014   Cirrhosis of liver ([place]) 11/05/2013   GERD (gastroesophageal reflux disease)  07/18/2012   Screening for colorectal cancer 07/18/2012   Pulmonary nodule 02/14/2012    Allergies: No Known Allergies Medications:  Current Outpatient Medications:    benzonatate (TESSALON) 200 MG capsule, Take 1 capsule (200 mg total) by mouth 3 (three) times daily as needed., Disp: 30 capsule, Rfl: 1   doxycycline (VIBRA-TABS) 100 MG tablet, Take 1 tablet (100 mg total) by mouth 2 (two) times daily., Disp: 20 tablet, Rfl: 0   promethazine-dextromethorphan (PROMETHAZINE-DM) 6.25-15 MG/5ML syrup, Take 5 mLs by mouth 3 (three) times daily as needed for cough., Disp: 240 mL, Rfl: 0   albuterol (PROVENTIL) (2.5 MG/3ML) 0.083% nebulizer solution, Take 3 mLs (2.5 mg total) by nebulization every 6 (six) hours as needed for wheezing or shortness of breath., Disp: 150 mL, Rfl: 1   albuterol (VENTOLIN HFA) 108 (90 Base) MCG/ACT inhaler, Inhale 2 puffs into the lungs every 6 (six) hours as needed for wheezing or shortness of breath., Disp: 8 g, Rfl: 2   atorvastatin (LIPITOR) 40 MG tablet, Take 1 tablet (40 mg total) by mouth daily at 6 PM. (NEEDS TO BE SEEN BEFORE NEXT REFILL), Disp: 30 tablet, Rfl: 0   doxazosin (CARDURA) 1 MG tablet, Take 1 tablet (1 mg total) by mouth daily., Disp: 90 tablet, Rfl: 1   flecainide (TAMBOCOR) 50 MG tablet, Take 1 tablet (50 mg total) by mouth 2 (two) times daily., Disp: 180 tablet, Rfl: 3   loratadine (CLARITIN) 10 MG tablet, Take 1 tablet (10 mg total) by mouth daily., Disp: 30 tablet, Rfl: 11   metFORMIN (GLUCOPHAGE) 500 MG tablet, TAKE ONE TABLET BY MOUTH TWICE DAILY, Disp: 180 tablet, Rfl: 0   omeprazole (PRILOSEC) 40 MG capsule, TAKE ONE (1) CAPSULE EACH DAY, Disp: 90 capsule, Rfl: 1   Tiotropium Bromide-Olodaterol (STIOLTO RESPIMAT) 2.5-2.5 MCG/ACT AERS, Inhale 2 puffs into the lungs daily., Disp: 1 each, Rfl: 5  Observations/Objective: Patient is well-developed, well-nourished in no acute distress.  Resting comfortably  at home.  Head is normocephalic,  atraumatic.  No labored breathing.  Speech is clear and coherent with logical content.  Patient is alert and oriented at baseline.  Nasal congestion    Assessment and Plan: 1. COPD exacerbation (HCC) - doxycycline (VIBRA-TABS) 100 MG tablet; Take 1 tablet (100 mg total) by mouth 2 (two) times daily.  Dispense: 20 tablet; Refill: 0 - benzonatate (TESSALON) 200 MG capsule; Take 1 capsule (200 mg total) by mouth 3 (three) times daily as needed.  Dispense: 30 capsule; Refill: 1 - promethazine-dextromethorphan (PROMETHAZINE-DM) 6.25-15 MG/5ML syrup; Take 5 mLs by mouth 3 (three) times daily as needed for cough.  Dispense: 240 mL; Refill: 0  - Take meds as prescribed - Use a cool mist humidifier  -Use saline nose sprays frequently -Force fluids -For any cough or congestion  Use plain Mucinex- regular strength or max strength is fine -For fever or aces or pains- take tylenol or ibuprofen. -Throat lozenges if help -Follow up if symptoms worsen or do not improve   Follow Up Instructions: I discussed the assessment and treatment plan with the patient. The patient was provided an opportunity to ask questions and all were answered. The patient agreed with the plan and demonstrated  an understanding of the instructions.  A copy of instructions were sent to the patient via MyChart unless otherwise noted below.     The patient was advised to call back or seek an in-person evaluation if the symptoms worsen or if the condition fails to improve as anticipated.  Time:  I spent 12 minutes with the patient via telehealth technology discussing the above problems/concerns.    Evelina Dun, FNP

## 2022-09-19 NOTE — Patient Instructions (Signed)

## 2022-10-06 ENCOUNTER — Ambulatory Visit (INDEPENDENT_AMBULATORY_CARE_PROVIDER_SITE_OTHER): Payer: Self-pay | Admitting: Family Medicine

## 2022-10-06 ENCOUNTER — Encounter: Payer: Self-pay | Admitting: Family Medicine

## 2022-10-06 ENCOUNTER — Telehealth: Payer: Self-pay | Admitting: Pharmacist

## 2022-10-06 VITALS — BP 123/77 | HR 63 | Temp 98.2°F | Ht 65.0 in | Wt 308.0 lb

## 2022-10-06 DIAGNOSIS — E1169 Type 2 diabetes mellitus with other specified complication: Secondary | ICD-10-CM

## 2022-10-06 DIAGNOSIS — J449 Chronic obstructive pulmonary disease, unspecified: Secondary | ICD-10-CM

## 2022-10-06 DIAGNOSIS — M7989 Other specified soft tissue disorders: Secondary | ICD-10-CM

## 2022-10-06 DIAGNOSIS — Z23 Encounter for immunization: Secondary | ICD-10-CM

## 2022-10-06 LAB — BAYER DCA HB A1C WAIVED: HB A1C (BAYER DCA - WAIVED): 6.5 % — ABNORMAL HIGH (ref 4.8–5.6)

## 2022-10-06 NOTE — Telephone Encounter (Signed)
Stiolto samples left up front for patient  Scheduled with pharmd for next week

## 2022-10-06 NOTE — Progress Notes (Signed)
Subjective: SW:NIOEV derm referral PCP: [name], DO [id number] Thomas Ball is a 62 y.o. male presenting to clinic today for:  1.  Soft tissue mass Patient reports he has had a soft tissue mass on the left inner thigh for quite some time.  Seems to be getting a little bit bigger and he would like to get this surgically removed.  Not sure if he needs to see a surgeon or dermatologist.  Had something similar in his upper extremity that he was told it was a cyst.  2. DM Reports compliance with metformin 500 mg twice daily.  He has not been following a strict diet.  In fact has gained some weight since our last visit.  He has had quite a bit of difficulty maintaining weight and abiding by a low-carb diet.  He knows that this affects his health.  He would be interested in a GLP if this could be obtained through patient assistance program as he still does not have medical insurance.  He denies any personal history or family history of medullary thyroid cancer, multiple endocrine type II neoplasia.  He does have a personal history of pancreatitis that was directly related to gallstones, which have been surgically removed.  He has had no recurrence since the 1980s.   ROS: Per HPI  No Known Allergies Past Medical History:  Diagnosis Date   GERD (gastroesophageal reflux disease)    HCV (hepatitis C virus) DR. ZACKS   AUG 2013 VIRAL LOAD UNDETECTABLE   History of cardiac catheterization    a. 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   Sleep apnea    SVT (supraventricular tachycardia)     Current Outpatient Medications:    albuterol (PROVENTIL) (2.5 MG/3ML) 0.083% nebulizer solution, Take 3 mLs (2.5 mg total) by nebulization every 6 (six) hours as needed for wheezing or shortness of breath., Disp: 150 mL, Rfl: 1   albuterol (VENTOLIN HFA) 108 (90 Base) MCG/ACT  inhaler, Inhale 2 puffs into the lungs every 6 (six) hours as needed for wheezing or shortness of breath., Disp: 8 g, Rfl: 2   atorvastatin (LIPITOR) 40 MG tablet, Take 1 tablet (40 mg total) by mouth daily at 6 PM. (NEEDS TO BE SEEN BEFORE NEXT REFILL), Disp: 30 tablet, Rfl: 0   doxazosin (CARDURA) 1 MG tablet, Take 1 tablet (1 mg total) by mouth daily., Disp: 90 tablet, Rfl: 1   flecainide (TAMBOCOR) 50 MG tablet, Take 1 tablet (50 mg total) by mouth 2 (two) times daily., Disp: 180 tablet, Rfl: 3   loratadine (CLARITIN) 10 MG tablet, Take 1 tablet (10 mg total) by mouth daily., Disp: 30 tablet, Rfl: 11   metFORMIN (GLUCOPHAGE) 500 MG tablet, TAKE ONE TABLET BY MOUTH TWICE DAILY, Disp: 180 tablet, Rfl: 0   omeprazole (PRILOSEC) 40 MG capsule, TAKE ONE (1) CAPSULE EACH DAY, Disp: 90 capsule, Rfl: 1   Tiotropium Bromide-Olodaterol (STIOLTO RESPIMAT) 2.5-2.5 MCG/ACT AERS, Inhale 2 puffs into the lungs daily., Disp: 1 each, Rfl: 5 Social History   Socioeconomic History   Marital status: Widowed    Spouse name: Not on file   Number of children: Not on file   Years of education: Not on file   Highest education level: Not on file  Occupational History   Occupation: concrete work    Fish farm manager: SELF-EMPLOYED  Tobacco Use   Smoking status: Every Day    Packs/day: 1.00    Years: 35.00    Total pack years: 35.00    Types: Cigarettes   Smokeless tobacco: Never   Tobacco comments:    smokes 1-1.5 packs per day  Vaping Use   Vaping Use: Never used  Substance and Sexual Activity   Alcohol use: No   Drug use: No    Comment: in the 1980s, smoke crack. None since Dec 2011.    Sexual activity: Not Currently  Other Topics Concern   Not on file  Social History Narrative   Not on file   Social Determinants of Health   Financial Resource Strain: Not on file  Food Insecurity: Not on file  Transportation Needs: Not on file  Physical Activity: Not on file  Stress: Not on file  Social Connections:  Not on file  Intimate Partner Violence: Not on file   Family History  Problem Relation Age of Onset   Emphysema Mother    COPD Mother    Early death Mother    Hyperlipidemia Father    Stroke Father    Diabetes Father    High Cholesterol Father    Colon cancer Neg Hx     Objective: Office vital signs reviewed. BP 123/77   Pulse 63   Temp 98.2 F (36.8 C)   Ht '5\' 5"'$  (1.651 m)   Wt (!) 308 lb (139.7 kg)   SpO2 94%   BMI 51.25 kg/m   Physical Examination:  General: Awake, alert, super morbidly obese, No acute distress HEENT: Sclera white. Cardio: regular rate and rhythm, S1S2 heard, no murmurs appreciated Pulm: clear to auscultation bilaterally, no wheezes, rhonchi or rales; normal work of breathing on room air Skin: 2 and half inch by 3 inch soft tissue mass noted along the left anterior thigh.  There is fatty quality to this mass and almost a herniation texture to the apex of it.  No appreciable punctum  Assessment/ Plan: 62 y.o. male   Mass of soft tissue of thigh - Plan: Ambulatory referral to General Surgery  Need for immunization against influenza - Plan: Flu Vaccine QUAD 70moIM (Fluarix, Fluzone & Alfiuria Quad PF)  Controlled type 2 diabetes mellitus with other specified complication, without long-term current use of insulin (HCC) - Plan: Bayer DCA Hb A1c Waived  Morbid obesity ([place]  Chronic obstructive pulmonary disease, unspecified COPD type ([place]  I suspect this is a lipoma.  I referred him to general surgery for evaluation for excision  His diabetes is controlled at 6.5 but given super morbid obesity and cardiac disease I am recommending Ozempic.  He will follow-up with clinical pharmacy to apply for the patient assistance program and start this medication.  We discussed risk factors, red flag signs and symptoms and cardiovascular benefits of the medication.  I do not see any contraindication to initiation.  Additionally, our clinical pharmacist will reach  out to the patient with Stiolto samples.  This is something that can also be obtained through the patient assistance program  No orders of the defined types were placed in this encounter.  No orders of the defined types were placed in this encounter.    AJanora Norlander DO [place]([phone]

## 2022-10-06 NOTE — Patient Instructions (Signed)
I think the "cyst" is actually a lipoma.  I've referred you to the general surgeon for removal.  Schedule a visit with Almyra Free, our pharmacist, to get assistance with your inhaler and get started on ozempic or rybelsus for diabetes and weight loss.

## 2022-10-10 ENCOUNTER — Ambulatory Visit (INDEPENDENT_AMBULATORY_CARE_PROVIDER_SITE_OTHER): Payer: Self-pay | Admitting: Pharmacist

## 2022-10-10 DIAGNOSIS — E1165 Type 2 diabetes mellitus with hyperglycemia: Secondary | ICD-10-CM

## 2022-10-10 NOTE — Progress Notes (Signed)
    S:    Chief Complaint  Patient presents with   Medication Management    Diabetes   62 y.o. male who presents for diabetes evaluation, education, and management.  PMH is significant for T2DM, HLD, HTN, SVT, BPH, GERD, tobacco use, and obesity.  Patient was referred and last seen by Primary Care Provider, Dr. Lajuana Ripple, on 10/06/2022.   At last visit, A1c was 6.5% and patient expressed interest in starting a GLP-1 RA for weight loss.   Today, patient is in good spirits. Patient states he is uninsured and is self employed. 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 He has not applied for Medicaid or looked into purchasing other commercial plans.   Current diabetes medications include: metformin 500 mg BID  Current hyperlipidemia medications include: atorvastatin 40 mg   Patient reports adherence to taking all medications as prescribed.   Insurance coverage: uninsured  Patient reported dietary habits: Eats 3 meals/day. Not much change in his dietary habits since being diagnosed with diabetes. Reports eating a lot of frozen meals.   Patient-reported exercise habits: Moves some at work   O:  [employer] Value Date   HGBA1C 6.5 (H) 10/06/2022   Lipid Panel     Component Value Date/Time   CHOL 109 10/24/2021 1159   TRIG 60 10/24/2021 1159   HDL 39 (L) 10/24/2021 1159   CHOLHDL 2.8 10/24/2021 1159   CHOLHDL 5.4 09/06/2017 1321   VLDL 24 09/06/2017 1321   LDLCALC 57 10/24/2021 1159   Clinical Atherosclerotic Cardiovascular Disease (ASCVD): No  The ASCVD Risk score (Arnett DK, et al., 2019) failed to calculate for the following reasons:   The valid total cholesterol range is 130 to 320 mg/dL   A/P: Diabetes longstanding currently controlled based on A1c (6.5%). Patient is able to verbalize appropriate hypoglycemia management plan. Medication adherence appears appropriate. Patient is interested in starting GLP-1 RA; however, he is uninsured.  Given patient is also having difficulty affording his inhaler, will provide patient with information on how to apply for Medicaid. If patient is denied Medicaid, then can apply for Ozempic patient assistance program.  -Continued metformin 500 mg BID. -Mailed Medicaid application to patient's house. Confirmed correct address in chart. Instructed patient he will need to mail or bring his completed application to the local [employer]  -Extensively discussed pathophysiology of diabetes, recommended lifestyle interventions, dietary effects on blood sugar control.  -Counseled on s/sx of and management of hypoglycemia.   Tobacco intake reduction- patient has cut down from 3 PPD to <10 cigarettes day. He has been using oral nicotine replacement with Zyn pouches. -Encouraged further intake reduction. Patient not ready to set quit date yet. -samples given of Stiolto respimat inhalers  Written patient instructions provided. Patient verbalized understanding of treatment plan.  Total time in face to face counseling 20 minutes.    Follow-up:  Pharmacist PRN.  Joseph Art, Pharm.D. PGY-2 Ambulatory Care Pharmacy Resident 10/10/2022 3:16 PM  Regina Eck, PharmD, BCPS, BCACP Clinical Pharmacist, [place]  [facility]  T [phone]

## 2022-10-12 ENCOUNTER — Other Ambulatory Visit: Payer: Self-pay | Admitting: Family Medicine

## 2022-10-17 ENCOUNTER — Ambulatory Visit (INDEPENDENT_AMBULATORY_CARE_PROVIDER_SITE_OTHER): Payer: No Typology Code available for payment source | Admitting: Acute Care

## 2022-10-17 ENCOUNTER — Ambulatory Visit: Payer: Self-pay | Admitting: Surgery

## 2022-10-17 ENCOUNTER — Encounter: Payer: Self-pay | Admitting: Acute Care

## 2022-10-17 DIAGNOSIS — F17210 Nicotine dependence, cigarettes, uncomplicated: Secondary | ICD-10-CM

## 2022-10-17 NOTE — Patient Instructions (Signed)
Thank you for participating in the [facility]. It was our pleasure to meet you today. We will call you with the results of your scan within the next few days. Your scan will be assigned a Lung RADS category score by the physicians reading the scans.  This Lung RADS score determines follow up scanning.  See below for description of categories, and follow up screening recommendations. We will be in touch to schedule your follow up screening annually or based on recommendations of our providers. We will fax a copy of your scan results to your Primary Care Physician, or the physician who referred you to the program, to ensure they have the results. Please call the office if you have any questions or concerns regarding your scanning experience or results.  Our office number is [phone]. Please speak with Denise Phelps, RN. , or  Denise Buckner RN, They are  our Lung Cancer Screening RN.'s If They are unavailable when you call, Please leave a message on the voice mail. We will return your call at our earliest convenience.This voice mail is monitored several times a day.  Remember, if your scan is normal, we will scan you annually as long as you continue to meet the criteria for the program. (Age 50-80, Current smoker or smoker who has quit within the last 15 years). If you are a smoker, remember, quitting is the single most powerful action that you can take to decrease your risk of lung cancer and other pulmonary, breathing related problems. We know quitting is hard, and we are here to help.  Please let us know if there is anything we can do to help you meet your goal of quitting. If you are a former smoker, congratulations. We are proud of you! Remain smoke free! Remember you can refer friends or family members through the number above.  We will screen them to make sure they meet criteria for the program. Thank you for helping us take better care of you by  participating in Lung Screening.  You can receive free nicotine replacement therapy ( patches, gum or mints) by calling 1-800-QUIT NOW. Please call so we can get you on the path to becoming  a non-smoker. I know it is hard, but you can do this!  Lung RADS Categories:  Lung RADS 1: no nodules or definitely non-concerning nodules.  Recommendation is for a repeat annual scan in 12 months.  Lung RADS 2:  nodules that are non-concerning in appearance and behavior with a very low likelihood of becoming an active cancer. Recommendation is for a repeat annual scan in 12 months.  Lung RADS 3: nodules that are probably non-concerning , includes nodules with a low likelihood of becoming an active cancer.  Recommendation is for a 6-month repeat screening scan. Often noted after an upper respiratory illness. We will be in touch to make sure you have no questions, and to schedule your 6-month scan.  Lung RADS 4 A: nodules with concerning findings, recommendation is most often for a follow up scan in 3 months or additional testing based on our provider's assessment of the scan. We will be in touch to make sure you have no questions and to schedule the recommended 3 month follow up scan.  Lung RADS 4 B:  indicates findings that are concerning. We will be in touch with you to schedule additional diagnostic testing based on our provider's  assessment of the scan.  Other options for assistance in smoking cessation (   As covered by your insurance benefits)  Hypnosis for smoking cessation  [employer]. [phone]  Acupuncture for smoking cessation  [employer] [phone]

## 2022-10-17 NOTE — Progress Notes (Signed)
Virtual Visit via Telephone Note  I connected with Thomas Ball on 10/17/22 at  4:00 PM EST by telephone and verified that I am speaking with the correct person using two identifiers.  Location: Patient:  At home Provider:  [place], [place], [place], [address]    I discussed the limitations, risks, security and privacy concerns of performing an evaluation and management service by telephone and the availability of in person appointments. I also discussed with the patient that there may be a patient responsible charge related to this service. The patient expressed understanding and agreed to proceed.    Shared Decision Making Visit Lung Cancer Screening Program [phone])   Eligibility: Age 62 y.o. Pack Years Smoking History Calculation 90 pack year smoking history (# packs/per year x # years smoked) Recent History of coughing up blood  no Unexplained weight loss? no ( >Than 15 pounds within the last 6 months ) Prior History Lung / other cancer no (Diagnosis within the last 5 years already requiring surveillance chest CT Scans). Smoking Status Current Smoker Former Smokers: Years since quit:  NA  Quit Date:  NA  Visit Components: Discussion included one or more decision making aids. yes Discussion included risk/benefits of screening. yes Discussion included potential follow up diagnostic testing for abnormal scans. yes Discussion included meaning and risk of over diagnosis. yes Discussion included meaning and risk of False Positives. yes Discussion included meaning of total radiation exposure. yes  Counseling Included: Importance of adherence to annual lung cancer LDCT screening. yes Impact of comorbidities on ability to participate in the program. yes Ability and willingness to under diagnostic treatment. yes  Smoking Cessation Counseling: Current Smokers:  Discussed importance of smoking cessation. yes Information about tobacco cessation classes and  interventions provided to patient. yes Patient provided with "ticket" for LDCT Scan. yes Symptomatic Patient. no  Counseling NA Diagnosis Code: Tobacco Use Z72.0 Asymptomatic Patient yes  Counseling (Intermediate counseling: > three minutes counseling) [id number] Former Smokers:  Discussed the importance of maintaining cigarette abstinence. yes Diagnosis Code: Personal History of Nicotine Dependence. P80.998 Information about tobacco cessation classes and interventions provided to patient. Yes Patient provided with "ticket" for LDCT Scan. yes Written Order for Lung Cancer Screening with LDCT placed in Epic. Yes (CT Chest Lung Cancer Screening Low Dose W/O CM) [id number] Z12.2-Screening of respiratory organs Z87.891-Personal history of nicotine dependence  I have spent 25 minutes of face to face/ virtual visit   time with  Thomas Ball discussing the risks and benefits of lung cancer screening. We viewed / discussed a power point together that explained in detail the above noted topics. We paused at intervals to allow for questions to be asked and answered to ensure understanding.We discussed that the single most powerful action that he can take to decrease his risk of developing lung cancer is to quit smoking. We discussed whether or not he is ready to commit to setting a quit date. We discussed options for tools to aid in quitting smoking including nicotine replacement therapy, non-nicotine medications, support groups, Quit Smart classes, and behavior modification. We discussed that often times setting smaller, more achievable goals, such as eliminating 1 cigarette a day for a week and then 2 cigarettes a day for a week can be helpful in slowly decreasing the number of cigarettes smoked. This allows for a sense of accomplishment as well as providing a clinical benefit. I provided  him  with smoking cessation  information  with contact information for community  resources, classes, free nicotine replacement  therapy, and access to mobile apps, text messaging, and on-line smoking cessation help. I have also provided  him  the office contact information in the event he needs to contact me, or the screening staff. We discussed the time and location of the scan, and that either Doroteo Glassman RN, Joella Prince, RN  or I will call / send a letter with the results within 24-72 hours of receiving them. The patient verbalized understanding of all of  the above and had no further questions upon leaving the office. They have my contact information in the event they have any further questions.  I spent 3 minutes counseling on smoking cessation and the health risks of continued tobacco abuse.  I explained to the patient that there has been a high incidence of coronary artery disease noted on these exams. I explained that this is a non-gated exam therefore degree or severity cannot be determined. This patient is on statin therapy. I have asked the patient to follow-up with their PCP regarding any incidental finding of coronary artery disease and management with diet or medication as their PCP  feels is clinically indicated. The patient verbalized understanding of the above and had no further questions upon completion of the visit.      Magdalen Spatz, NP 10/17/2022

## 2022-10-18 ENCOUNTER — Ambulatory Visit (HOSPITAL_COMMUNITY)
Admission: RE | Admit: 2022-10-18 | Discharge: 2022-10-18 | Disposition: A | Discharge status: Home / Self Care | Payer: No Typology Code available for payment source | Source: Ambulatory Visit | Attending: Acute Care | Admitting: Acute Care

## 2022-10-18 DIAGNOSIS — Z87891 Personal history of nicotine dependence: Secondary | ICD-10-CM | POA: Insufficient documentation

## 2022-10-18 DIAGNOSIS — F17210 Nicotine dependence, cigarettes, uncomplicated: Secondary | ICD-10-CM | POA: Insufficient documentation

## 2022-10-18 DIAGNOSIS — Z122 Encounter for screening for malignant neoplasm of respiratory organs: Secondary | ICD-10-CM | POA: Insufficient documentation

## 2022-10-19 ENCOUNTER — Telehealth: Payer: Self-pay | Admitting: Acute Care

## 2022-10-19 ENCOUNTER — Telehealth: Payer: Self-pay

## 2022-10-19 ENCOUNTER — Other Ambulatory Visit: Payer: Self-pay

## 2022-10-19 DIAGNOSIS — R911 Solitary pulmonary nodule: Secondary | ICD-10-CM

## 2022-10-19 DIAGNOSIS — Z87891 Personal history of nicotine dependence: Secondary | ICD-10-CM

## 2022-10-19 NOTE — Telephone Encounter (Signed)
Spoke with patient to review results of LDCT.  Results show atherosclerosis and emphysema.  Patient states he has experienced a little more shortness of breath lately but also has a. Fib.  Will see cardiology soon so will discuss with them and also have them review LDCT results. He denies any symptoms of recent illness.   Pulmonary nodules were noted with one larger nodule noted that has recommendation to review again in 6 months, as precaution.  Nodule was noted on CT in 2018 with minimal changes but since this is his first LDCT, would recommend repeating the LDCT in 6 months just to take another look at it.  It is likely benign due to minimal changes over several years.  Patient had no further questions and  agrees to follow up LDCT in 6 months. Order placed for 6 months follow up LDCT and PCP faxed results.

## 2022-10-19 NOTE — Telephone Encounter (Signed)
PT ret your call about CT results. Pls call @ [phone]

## 2022-10-19 NOTE — Telephone Encounter (Signed)
Left VM for patient to call for review of LDCT results.

## 2022-10-20 NOTE — Telephone Encounter (Signed)
See other telephone note from 10/19/22

## 2022-10-31 ENCOUNTER — Ambulatory Visit: Payer: No Typology Code available for payment source | Admitting: Internal Medicine

## 2022-10-31 NOTE — Progress Notes (Signed)
NURSE VISIT

## 2022-11-07 ENCOUNTER — Ambulatory Visit (INDEPENDENT_AMBULATORY_CARE_PROVIDER_SITE_OTHER): Payer: Self-pay | Admitting: Surgery

## 2022-11-07 ENCOUNTER — Encounter: Payer: Self-pay | Admitting: Surgery

## 2022-11-07 ENCOUNTER — Other Ambulatory Visit: Payer: Self-pay | Admitting: *Deleted

## 2022-11-07 VITALS — BP 134/87 | HR 60 | Temp 98.2°F | Resp 18 | Ht 65.0 in | Wt 306.0 lb

## 2022-11-07 DIAGNOSIS — E1165 Type 2 diabetes mellitus with hyperglycemia: Secondary | ICD-10-CM

## 2022-11-07 DIAGNOSIS — I10 Essential (primary) hypertension: Secondary | ICD-10-CM

## 2022-11-07 DIAGNOSIS — R2242 Localized swelling, mass and lump, left lower limb: Secondary | ICD-10-CM

## 2022-11-07 NOTE — Progress Notes (Signed)
[facility] History and Physical  Reason for Referral:left thigh mass Referring Physician: Dr. Lajuana Ripple  Chief Complaint   New Patient (Initial Visit)     Thomas Ball is a 62 y.o. male.  HPI: Patient presents for evaluation of a left upper thigh mass.  It has been present for several years, and it has been increasing in size.  He denies any history of previous inflammation of the area, but states that it will intermittently cause a burning sensation.  He does have a history of cysts in other areas of his body.  He denies any fevers or chills.  His past medical history significant for atrial fibrillation status post ablation, COPD, and diabetes currently on metformin.  He denies use of blood thinning medications.  He smokes 1/2 pack of cigarettes per day.  He denies use of alcohol and illicit drugs.  His surgical history is significant for cholecystectomy and lipoma excisions.  Past Medical History:  Diagnosis Date   GERD (gastroesophageal reflux disease)    HCV (hepatitis C virus) DR. ZACKS   AUG 2013 VIRAL LOAD UNDETECTABLE   History of cardiac catheterization    a. 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   CHOLECYSTECTOMY     COLONOSCOPY  10/04/2012   SLF:ONE/8 SIMPLE ADENOMA, TICS, SML IH   ESOPHAGOGASTRODUODENOSCOPY N/A 09/04/2014   Procedure: ESOPHAGOGASTRODUODENOSCOPY (EGD);  Surgeon: Danie Binder, MD;  [facility];  Service: Endoscopy;  Laterality: N/A;  145   GALLBLADDER SURGERY     JAW BROKEN     LIPOMA REMOVALS     SEVERAL   RIGHT/LEFT HEART CATH AND CORONARY ANGIOGRAPHY N/A 10/08/2018   Procedure: RIGHT/LEFT HEART CATH AND CORONARY ANGIOGRAPHY;   Surgeon: Martinique, Peter M, MD;  Location: [facility];  Service: Cardiovascular;  Laterality: N/A;   SUPRAVENTRICULAR TACHYCARDIA ABLATION N/A 06/22/2014   Procedure: SUPRAVENTRICULAR TACHYCARDIA ABLATION;  Surgeon: Evans Lance, MD;  Location: [facility];  Service: Cardiovascular;  Laterality: N/A;    Family History  Problem Relation Age of Onset   Emphysema Mother    COPD Mother    Early death Mother    Hyperlipidemia Father    Stroke Father    Diabetes Father    High Cholesterol Father    Colon cancer Neg Hx     Social History   Tobacco Use   Smoking status: Every Day    Packs/day: 2.00    Years: 45.00    Total pack years: 90.00    Types: Cigarettes   Smokeless tobacco: Never   Tobacco comments:    smokes 1-1.5 packs per day  Vaping Use   Vaping Use: Never used  Substance Use Topics   Alcohol use: No   Drug use: No    Comment: in the 1980s, smoke crack. None since Dec 2011.     Medications: I have reviewed the patient's current medications. Prior to Admission: (Not in a hospital admission)  Scheduled: Continuous: PRN: Allergies as of 11/07/2022   No Known Allergies      Medication List        Accurate as of November 07, 2022 10:28 AM. If you have any questions, ask your nurse or doctor.          albuterol (2.5 MG/3ML) 0.083% nebulizer solution Commonly known as: PROVENTIL Take 3 mLs (2.5 mg total) by nebulization every 6 (six) hours as needed for wheezing or shortness of breath.   albuterol 108 (90 Base) MCG/ACT inhaler Commonly known as: VENTOLIN HFA Inhale 2 puffs into the lungs every 6 (six) hours as needed for wheezing or shortness of breath.   atorvastatin 40 MG tablet Commonly known as: LIPITOR TAKE 1 TABLET DAILY AT 6PM NEEDS TO BE SEEN   doxazosin 1 MG tablet Commonly known as: Cardura Take 1 tablet (1 mg total) by mouth daily.   flecainide 50 MG tablet Commonly known as: TAMBOCOR Take 1 tablet (50 mg total) by mouth 2 (two)  times daily.   loratadine 10 MG tablet Commonly known as: CLARITIN Take 1 tablet (10 mg total) by mouth daily.   metFORMIN 500 MG tablet Commonly known as: GLUCOPHAGE TAKE ONE TABLET BY MOUTH TWICE DAILY   omeprazole 40 MG capsule Commonly known as: PRILOSEC TAKE ONE (1) CAPSULE EACH DAY   Stiolto Respimat 2.5-2.5 MCG/ACT Aers Generic drug: Tiotropium Bromide-Olodaterol Inhale 2 puffs into the lungs daily.         ROS:  Pertinent items are noted in HPI.  Blood pressure 134/87, pulse 60, temperature 98.2 F (36.8 C), temperature source Oral, resp. rate 18, height 5' 5"$  (1.651 m), weight (!) 306 lb (138.8 kg), SpO2 94 %. Physical Exam Vitals reviewed.  Constitutional:      Appearance: Normal appearance. He is obese.  HENT:     Head: Normocephalic and atraumatic.  Eyes:     Extraocular Movements: Extraocular movements intact.     Pupils: Pupils are equal, round, and reactive to light.  Cardiovascular:     Rate and Rhythm: Normal rate and regular rhythm.  Pulmonary:     Effort: Pulmonary effort is normal.     Breath sounds: Normal breath sounds.  Abdominal:     General: There is no distension.     Palpations: Abdomen is soft.     Tenderness: There is no abdominal tenderness.  Musculoskeletal:        General: Normal range of motion.     Cervical back: Normal range of motion.  Skin:    General: Skin is warm and dry.     Comments: Left upper thigh mass, hard, 10 cm in size, nontender to palpation, then overlying skin at the lateral aspect of the mass  Neurological:     General: No focal deficit present.     Mental Status: He is alert and oriented to person, place, and time.  Psychiatric:        Mood and Affect: Mood normal.        Behavior: Behavior normal.     Results: No results found for this or any previous visit (from the past 48 hour(s)).  No results found.   Assessment & Plan:  Thomas Ball is a 62 y.o. male who presents for evaluation of left  thigh mass.  -Explained to the patient that given the physical characteristics and size of the mass, I would recommend CT imaging to evaluate the mass -Will order CT of the left thigh with IV contrast -Further recommendations to follow CT scan  All questions were answered to the satisfaction of the patient.   Graciella Freer, DO [facility] [address]  [place], [place] [address] [phone] (office)

## 2022-11-08 LAB — BASIC METABOLIC PANEL
BUN/Creatinine Ratio: 20 (ref 10–24)
BUN: 17 mg/dL (ref 8–27)
CO2: 21 mmol/L (ref 20–29)
Calcium: 9 mg/dL (ref 8.6–10.2)
Chloride: 104 mmol/L (ref 96–106)
Creatinine, Ser: 0.83 mg/dL (ref 0.76–1.27)
Glucose: 109 mg/dL — ABNORMAL HIGH (ref 70–99)
Potassium: 4.5 mmol/L (ref 3.5–5.2)
Sodium: 138 mmol/L (ref 134–144)
eGFR: 100 mL/min/{1.73_m2} (ref >59)

## 2022-11-12 ENCOUNTER — Ambulatory Visit (HOSPITAL_BASED_OUTPATIENT_CLINIC_OR_DEPARTMENT_OTHER)
Admission: RE | Admit: 2022-11-12 | Discharge: 2022-11-12 | Disposition: A | Discharge status: Home / Self Care | Payer: No Typology Code available for payment source | Source: Ambulatory Visit | Attending: Surgery | Admitting: Surgery

## 2022-11-12 DIAGNOSIS — R2242 Localized swelling, mass and lump, left lower limb: Secondary | ICD-10-CM | POA: Insufficient documentation

## 2022-11-12 MED ORDER — IOHEXOL 300 MG/ML  SOLN
100.0000 mL | Freq: Once | INTRAMUSCULAR | Status: AC | PRN
  Administered 2022-11-12: 100 mL via INTRAVENOUS

## 2022-11-15 ENCOUNTER — Other Ambulatory Visit: Payer: Self-pay | Admitting: *Deleted

## 2022-11-15 DIAGNOSIS — C499 Malignant neoplasm of connective and soft tissue, unspecified: Secondary | ICD-10-CM

## 2022-11-16 ENCOUNTER — Telehealth (INDEPENDENT_AMBULATORY_CARE_PROVIDER_SITE_OTHER): Payer: No Typology Code available for payment source | Admitting: Surgery

## 2022-11-16 DIAGNOSIS — R2242 Localized swelling, mass and lump, left lower limb: Secondary | ICD-10-CM

## 2022-11-16 NOTE — Telephone Encounter (Signed)
[facility]  Called to speak with the patient and update him on the results of his CT scan.  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  Given the imaging findings, I think this patient would be better served by a surgical oncologist.  I explained to the patient that he have sent a referral to a surgical oncologist, whose office will call to get him scheduled for a consultation.  Patient is agreeable and all questions answered to his expressed satisfaction.  CT left lower extremity (11/12/22): IMPRESSION: 1. Somewhat ill-defined 3.6 x 5.1 x 3.8 cm superficial fat density lesion in the anterior upper thigh, with areas of stranding and slight septal thickening medially. 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 Excisional biopsy is recommended.  Graciella Freer, DO [facility] [address] [place], [place] [address] [phone] (office)

## 2022-11-21 ENCOUNTER — Encounter: Payer: Self-pay | Admitting: Internal Medicine

## 2022-11-21 ENCOUNTER — Ambulatory Visit: Payer: No Typology Code available for payment source | Attending: Internal Medicine | Admitting: Internal Medicine

## 2022-11-21 VITALS — BP 122/80 | HR 61 | Ht 65.0 in | Wt 297.0 lb

## 2022-11-21 DIAGNOSIS — I10 Essential (primary) hypertension: Secondary | ICD-10-CM

## 2022-11-21 NOTE — Patient Instructions (Signed)
Medication Instructions:  Your physician recommends that you continue on your current medications as directed. Please refer to the Current Medication list given to you today.  *If you need a refill on your cardiac medications before your next appointment, please call your pharmacy*   Lab Work: NONE   If you have labs (blood work) drawn today and your tests are completely normal, you will receive your results only by: [place] (if you have MyChart) OR A paper copy in the mail If you have any lab test that is abnormal or we need to change your treatment, we will call you to review the results.   Testing/Procedures: NONE    Follow-Up: At [facility], you and your health needs are our priority.  As part of our continuing mission to provide you with exceptional heart care, we have created designated Provider Care Teams.  These Care Teams include your primary Cardiologist (physician) and Advanced Practice Providers (APPs -  Physician Assistants and Nurse Practitioners) who all work together to provide you with the care you need, when you need it.  We recommend signing up for the patient portal called "MyChart".  Sign up information is provided on this After Visit Summary.  MyChart is used to connect with patients for Virtual Visits (Telemedicine).  Patients are able to view lab/test results, encounter notes, upcoming appointments, etc.  Non-urgent messages can be sent to your provider as well.   To learn more about what you can do with MyChart, go to [email or web address].    Your next appointment:   1 year(s)  Provider:   Cristopher Peru, MD    Other Instructions Thank you for choosing [facility]!

## 2022-11-21 NOTE — Progress Notes (Signed)
HPI Thomas Ball returns for followup of palpitations and tachy-brady syndrome. He had SVT in 2015 and underwent ablation. He has had an improvement in his palpitations.  No syncope. He did have fairly significant sinus brady when her wore the heart monitor but is now asymptomatic. 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 He had lost weight but gained back all but 9 lbs.  No Known Allergies   Current Outpatient Medications  Medication Sig Dispense Refill   albuterol (PROVENTIL) (2.5 MG/3ML) 0.083% nebulizer solution Take 3 mLs (2.5 mg total) by nebulization every 6 (six) hours as needed for wheezing or shortness of breath. 150 mL 1   albuterol (VENTOLIN HFA) 108 (90 Base) MCG/ACT inhaler Inhale 2 puffs into the lungs every 6 (six) hours as needed for wheezing or shortness of breath. 8 g 2   atorvastatin (LIPITOR) 40 MG tablet TAKE 1 TABLET DAILY AT 6PM NEEDS TO BE SEEN 30 tablet 0   doxazosin (CARDURA) 1 MG tablet Take 1 tablet (1 mg total) by mouth daily. 90 tablet 1   flecainide (TAMBOCOR) 50 MG tablet Take 1 tablet (50 mg total) by mouth 2 (two) times daily. 180 tablet 3   metFORMIN (GLUCOPHAGE) 500 MG tablet TAKE ONE TABLET BY MOUTH TWICE DAILY 180 tablet 0   omeprazole (PRILOSEC) 40 MG capsule TAKE ONE (1) CAPSULE EACH DAY 90 capsule 1   Tiotropium Bromide-Olodaterol (STIOLTO RESPIMAT) 2.5-2.5 MCG/ACT AERS Inhale 2 puffs into the lungs daily. 1 each 5   loratadine (CLARITIN) 10 MG tablet Take 1 tablet (10 mg total) by mouth daily. (Patient not taking: Reported on 11/21/2022) 30 tablet 11   No current facility-administered medications for this visit.     Past Medical History:  Diagnosis Date   GERD (gastroesophageal reflux disease)    HCV (hepatitis C virus) DR. ZACKS   AUG 2013 VIRAL LOAD UNDETECTABLE   History of cardiac catheterization    a. normal cors by cath in 09/2018 with mild pulmonary HTN   Hypercholesteremia    Hypertension     Pancreatitis, gallstone    Skin cancer 1992   Removed from side of head   Sleep apnea    SVT (supraventricular tachycardia)     ROS:   All systems reviewed and negative except as noted in the HPI.   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   CHOLECYSTECTOMY     COLONOSCOPY  10/04/2012   SLF:ONE/8 SIMPLE ADENOMA, TICS, SML IH   ESOPHAGOGASTRODUODENOSCOPY N/A 09/04/2014   Procedure: ESOPHAGOGASTRODUODENOSCOPY (EGD);  Surgeon: Danie Binder, MD;  [facility];  Service: Endoscopy;  Laterality: N/A;  145   GALLBLADDER SURGERY     JAW BROKEN     LIPOMA REMOVALS     SEVERAL   RIGHT/LEFT HEART CATH AND CORONARY ANGIOGRAPHY N/A 10/08/2018   Procedure: RIGHT/LEFT HEART CATH AND CORONARY ANGIOGRAPHY;  Surgeon: Martinique, Peter M, MD;  Location: [facility];  Service: Cardiovascular;  Laterality: N/A;   SUPRAVENTRICULAR TACHYCARDIA ABLATION N/A 06/22/2014   Procedure: SUPRAVENTRICULAR TACHYCARDIA ABLATION;  Surgeon: Evans Lance, MD;  Location: [facility] CATH LAB;  Service: Cardiovascular;  Laterality: N/A;     Family History  Problem Relation Age of Onset   Emphysema Mother    COPD Mother    Early death Mother    Hyperlipidemia Father    Stroke Father  Diabetes Father    High Cholesterol Father    Colon cancer Neg Hx      Social History   Socioeconomic History   Marital status: Widowed    Spouse name: Not on file   Number of children: Not on file   Years of education: Not on file   Highest education level: Not on file  Occupational History   Occupation: concrete work    Fish farm manager: SELF-EMPLOYED  Tobacco Use   Smoking status: Every Day    Packs/day: 2.00    Years: 45.00    Total pack years: 90.00    Types: Cigarettes   Smokeless tobacco: Never   Tobacco comments:    smokes 1-1.5 packs per day  Vaping Use   Vaping Use: Never used  Substance and Sexual Activity   Alcohol use: No   Drug use: No     Comment: in the 1980s, smoke crack. None since Dec 2011.    Sexual activity: Not Currently  Other Topics Concern   Not on file  Social History Narrative   Not on file   Social Determinants of Health   Financial Resource Strain: Not on file  Food Insecurity: Not on file  Transportation Needs: Not on file  Physical Activity: Not on file  Stress: Not on file  Social Connections: Not on file  Intimate Partner Violence: Not on file     BP 122/80   Pulse 61   Ht '5\' 5"'$  (1.651 m)   Wt 297 lb (134.7 kg)   SpO2 93%   BMI 49.42 kg/m   Physical Exam:  Well appearing NAD HEENT: Unremarkable Neck:  No JVD, no thyromegally Lymphatics:  No adenopathy Back:  No CVA tenderness Lungs:  Clear with no wheezes HEART:  Regular rate rhythm, no murmurs, no rubs, no clicks Abd:  soft, positive bowel sounds, no organomegally, no rebound, no guarding Ext:  2 plus pulses, no edema, no cyanosis, no clubbing Skin:  No rashes no nodules Neuro:  CN II through XII intact, motor grossly intact  EKG -  - nsr with PAC's  Assess/Plan:  Symptomatic tachy-brady - He will continue low dose flecainide. No indication for a ppm. Hopefully the flecainide will continue to control his symptoms. If not we will increase the dose. Obesity - he is encouraged to lose weight.  HTN - his bp is ok today.  Copd - He is out of an inhaler and I prescribed albuterol. Preop eval - he is low risk for cardiac comp's from pending sarcoma surgery. He may proceed.    Thomas Overlie Idona Stach,MD

## 2022-11-23 ENCOUNTER — Telehealth: Payer: Self-pay | Admitting: Family Medicine

## 2022-11-23 ENCOUNTER — Encounter: Payer: Self-pay | Admitting: Radiology

## 2022-11-23 NOTE — Telephone Encounter (Signed)
Patient needs to see Dr. Darnell Level

## 2022-11-24 NOTE — Telephone Encounter (Signed)
Spoke to pt. Has appt with surgical oncology on 3/12.

## 2022-12-15 ENCOUNTER — Ambulatory Visit (INDEPENDENT_AMBULATORY_CARE_PROVIDER_SITE_OTHER): Payer: No Typology Code available for payment source | Admitting: Pulmonary Disease

## 2022-12-15 ENCOUNTER — Encounter: Payer: Self-pay | Admitting: Pulmonary Disease

## 2022-12-15 VITALS — BP 121/77 | HR 84 | Ht 62.0 in | Wt 302.4 lb

## 2022-12-15 DIAGNOSIS — G4733 Obstructive sleep apnea (adult) (pediatric): Secondary | ICD-10-CM

## 2022-12-15 DIAGNOSIS — J418 Mixed simple and mucopurulent chronic bronchitis: Secondary | ICD-10-CM

## 2022-12-15 DIAGNOSIS — F17210 Nicotine dependence, cigarettes, uncomplicated: Secondary | ICD-10-CM

## 2022-12-15 DIAGNOSIS — R911 Solitary pulmonary nodule: Secondary | ICD-10-CM

## 2022-12-15 NOTE — Patient Instructions (Signed)
Follow up in 6 months 

## 2022-12-15 NOTE — Progress Notes (Signed)
[facility]  Chief Complaint  Patient presents with   Follow-up    Pt f/u states that CPAP use is going well, he also states that his breathing is "baseline" and doesn't currently stop him from getting work done    Constitutional:  BP 121/77   Pulse 84   Ht 5\' 2"  (1.575 m)   Wt (!) 302 lb 6.4 oz (137.2 kg)   SpO2 93%   BMI 55.31 kg/m   Past Medical History:  SVT, Gallstone pancreatitis, HTN, HLD, Hep C, GERD  Past Surgical History:  His  has a past surgical history that includes JAW BROKEN; Cholecystectomy; LIPOMA REMOVALS; Colonoscopy (10/04/2012); Ablation (06-22-14); supraventricular tachycardia ablation (N/A, 06/22/2014); Esophagogastroduodenoscopy (N/A, 09/04/2014); Gallbladder surgery; and RIGHT/LEFT HEART CATH AND CORONARY ANGIOGRAPHY (N/A, 10/08/2018).  Brief Summary:  Thomas Ball is a 62 y.o. male smoker with OSA and dyspnea.      Subjective:   He has a growth on his Lt thigh.  He has been seen by surgery at [facility] and will have surgery on April 4.  He smokes 1/2 to 1 ppd.  He has started using flavored nicotine pouch and this has helped.  A lot of time he smokes because he is bored.  Not having cough, wheeze, or chest congestion.  He is able to keep up with mowing his lawn.  He uses his CPAP nightly.  No issue with mask fit.  He sleeps in 2 hour stretches.  His low dose CT chest showed RLL nodule that might have slight change from 2018.  Physical Exam:   Appearance - well kempt   ENMT - no sinus tenderness, no oral exudate, no LAN, Mallampati 4 airway, no stridor  Respiratory - equal breath sounds bilaterally, no wheezing or rales  CV - s1s2 regular rate and rhythm, no murmurs  Ext - no clubbing, no edema  Skin - no rashes  Psych - normal mood and affect       Pulmonary testing:  PFT 12/02/18 >> FEV1 2.78 (92%), FEV1% 79, TLC 4.83 (83%), DLCO 97% PFT 11/15/21 >> FEV1 2.45 (84%), FEV1% 77, TLC 5.66  (97%), DLCO 99%  Chest Imaging:  CT angio chest 04/28/17 >> calcified granuloma LUL LDCT chest 10/18/22 >> calcified granuloma LUL, 8.8 mm RLL nodule, mild emphysema, bronchial wall thickening  Sleep Tests:  PSG 08/01/17 >> AHI 40.4, SpO2 low 74% Auto CPAP 04/01/22 to 04/30/22 >> used on 29 of 30 nights with average 3 hrs 15 min.  Average AHI 4.7 with median CPAP 6 and 95 th percentile CPAP 11 cm H2O  Cardiac Tests:  Echo 09/16/18 >> EF 60 to 65%, mild LVH, grade 1 DD RHC/LHC 10/08/18 >> no CAD, mild pulmonary HTN Echo 03/30/22 >> EF 55 to 60%, mod LA dilation  Social History:  He  reports that he has been smoking cigarettes. He has a 90.00 pack-year smoking history. He has never used smokeless tobacco. He reports that he does not drink alcohol and does not use drugs.  Family History:  His family history includes COPD in his mother; Diabetes in his father; Early death in his mother; Emphysema in his mother; High Cholesterol in his father; Hyperlipidemia in his father; Stroke in his father.     Assessment/Plan:   Obstructive sleep apnea. - he will continue to purchase supplies on his own  - continue auto CPAP 5 to 15 cm H2O   COPD with chronic bronchitis. - stiolto 2  puffs daily - prn albuterol - he has a nebulizer machine   Tobacco abuse, lung nodule. - follow up low dose CT chest July 2024 - he is using flavored nicotine pouches to help with smoking cessation  Obesity. - encouraged him to keep up with diet modification and exercise regimen  Post nasal drip with deviated septum. - prn claritin  Lt thigh mass. - he has surgery scheduled for April 4 at [place] - no pulmonary restrictions to him having surgery  Time Spent Involved in Patient Care on Day of Examination:  26 minutes  Follow up:   There are no Patient Instructions on file for this visit.  Medication List:   Allergies as of 12/15/2022   No Known Allergies      Medication List        Accurate as  of December 15, 2022  8:25 AM. If you have any questions, ask your nurse or doctor.          albuterol (2.5 MG/3ML) 0.083% nebulizer solution Commonly known as: PROVENTIL Take 3 mLs (2.5 mg total) by nebulization every 6 (six) hours as needed for wheezing or shortness of breath.   albuterol 108 (90 Base) MCG/ACT inhaler Commonly known as: VENTOLIN HFA Inhale 2 puffs into the lungs every 6 (six) hours as needed for wheezing or shortness of breath.   atorvastatin 40 MG tablet Commonly known as: LIPITOR TAKE 1 TABLET DAILY AT 6PM NEEDS TO BE SEEN   doxazosin 1 MG tablet Commonly known as: Cardura Take 1 tablet (1 mg total) by mouth daily.   flecainide 50 MG tablet Commonly known as: TAMBOCOR Take 1 tablet (50 mg total) by mouth 2 (two) times daily.   loratadine 10 MG tablet Commonly known as: CLARITIN Take 1 tablet (10 mg total) by mouth daily.   metFORMIN 500 MG tablet Commonly known as: GLUCOPHAGE TAKE ONE TABLET BY MOUTH TWICE DAILY   omeprazole 40 MG capsule Commonly known as: PRILOSEC TAKE ONE (1) CAPSULE EACH DAY   Stiolto Respimat 2.5-2.5 MCG/ACT Aers Generic drug: Tiotropium Bromide-Olodaterol Inhale 2 puffs into the lungs daily.        Signature:  Chesley Mires, MD [place] Pager - [phone] 12/15/2022, 8:25 AM

## 2022-12-18 ENCOUNTER — Ambulatory Visit: Payer: Self-pay | Admitting: Internal Medicine

## 2022-12-19 ENCOUNTER — Ambulatory Visit (INDEPENDENT_AMBULATORY_CARE_PROVIDER_SITE_OTHER): Payer: No Typology Code available for payment source | Admitting: Family Medicine

## 2022-12-19 ENCOUNTER — Encounter: Payer: Self-pay | Admitting: Family Medicine

## 2022-12-19 NOTE — Progress Notes (Signed)
Erroneous encounter

## 2022-12-20 ENCOUNTER — Telehealth: Payer: Self-pay | Admitting: Pulmonary Disease

## 2022-12-20 NOTE — Telephone Encounter (Signed)
Patient states needs surgical clearance. Needs to be sent to Dr. Crisoforo Oxford fax number is [phone]. Patient phone number is [phone].

## 2022-12-21 ENCOUNTER — Ambulatory Visit: Payer: Self-pay | Admitting: Physician Assistant

## 2022-12-21 NOTE — Progress Notes (Deleted)
Office Visit    Patient Name: Thomas Ball Date of Encounter: 12/21/2022  PCP:  [name], DO   [facility]  Cardiologist:  Dorris Carnes, MD  Advanced Practice Provider:  No care team member to display Electrophysiologist:  Cristopher Peru, MD   HPI    Thomas Ball is a 62 y.o. male with past medical history of nonobstructive CAD, hypertension, hyperlipidemia, PS DVT status post SVT ablation in 2015, OSA, tobacco use, DM, cirrhosis, pancreatitis presents today for follow-up appointment.  Thomas Ball was initially seen 04/01/2014 for consultation of tachycardia.  EKG was completed showing functional tachycardia, troponins were also normal.  Chest CT was completed and ruled out pulmonary embolism.  He underwent Lexiscan Myoview that showed no evidence of ischemia.  2D echocardiogram also completed with normal LV function.  Referred to EP and underwent SVT ablation 9/15.  He was seen in follow-up 11/18 with complaint of chest pain underwent Lexiscan that was negative for ischemia.  He presented to the ED 12/19 with continued chest discomfort and troponins were negative.  BNP was mildly elevated and EKG showed no acute changes.  He was seen in follow-up by Melvyn Neth, PA on 09/2018 with no change in intermittent chest discomfort.  Underwent LHC performed by Dr. Martinique 09/2018 that revealed normal coronary anatomy with normal LV function and LV filling pressures.  He was noted to have mild pulmonary hypertension and medical management was recommended.  He was seen in 03/2022 with chest pain again and 2D echo was completed showing EF 55 to 60% with moderate LAE, normal RV, mild RAE with no significant tricuspid regurgitation with pulmonary hypertension noted.  Patient's chest pain was felt to be musculoskeletal in nature and from by stress.  He was seen in follow-up 04/2022 with complaint of elevated and slow heart rates.  He wore an event monitor that showed minimal  heart rate of 40/90 no pauses or atrial fibrillation with short run of PAT/SVT.  Seen by Dr. Lovena Le 11/04/2021 with termination of symptomatic bradycardia and flecainide was started.  He was seen by Ambrose Pancoast, NP 12/23 with a chief complaint of low heart rate.  He was reporting that his heart rate would dip into the 20s by his pulse oximeter.  No associated symptoms of dizziness or strong palpitations with his heart rate.  He also denied chest pain or heart rate was decreased.  EKG completed at today showed rate 64 bpm with PACs.  Advise it is more likely his pulse oximeter counting one of his PACs is a beat and registering as normal heart rate.  Discussed the importance of using his Kardia device to verify.  Today, he ***  Past Medical History    Past Medical History:  Diagnosis Date   GERD (gastroesophageal reflux disease)    HCV (hepatitis C virus) DR. ZACKS   AUG 2013 VIRAL LOAD UNDETECTABLE   History of cardiac catheterization    a. 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   CHOLECYSTECTOMY     COLONOSCOPY  10/04/2012   SLF:ONE/8 SIMPLE ADENOMA, TICS, SML IH   ESOPHAGOGASTRODUODENOSCOPY N/A 09/04/2014  Procedure: ESOPHAGOGASTRODUODENOSCOPY (EGD);  Surgeon: Danie Binder, MD;  [facility];  Service: Endoscopy;  Laterality: N/A;  145   GALLBLADDER SURGERY     JAW BROKEN     LIPOMA REMOVALS     SEVERAL   RIGHT/LEFT HEART CATH AND CORONARY ANGIOGRAPHY N/A 10/08/2018   Procedure: RIGHT/LEFT HEART CATH AND CORONARY ANGIOGRAPHY;  Surgeon: Martinique, Peter M, MD;  Location: [facility];  Service: Cardiovascular;  Laterality: N/A;   SUPRAVENTRICULAR TACHYCARDIA ABLATION N/A 06/22/2014    Procedure: SUPRAVENTRICULAR TACHYCARDIA ABLATION;  Surgeon: Evans Lance, MD;  Location: [facility] CATH LAB;  Service: Cardiovascular;  Laterality: N/A;    Allergies  No Known Allergies   EKGs/Labs/Other Studies Reviewed:   The following studies were reviewed today: ***  EKG:  EKG is *** ordered today.  The ekg ordered today demonstrates ***  Recent Labs: 03/21/2022: Pro B Natriuretic peptide (BNP) 192.0 04/28/2022: Hemoglobin 14.5; Platelets 168; TSH 0.940 11/07/2022: BUN 17; Creatinine, Ser 0.83; Potassium 4.5; Sodium 138  Recent Lipid Panel    Component Value Date/Time   CHOL 109 10/24/2021 1159   TRIG 60 10/24/2021 1159   HDL 39 (L) 10/24/2021 1159   CHOLHDL 2.8 10/24/2021 1159   CHOLHDL 5.4 09/06/2017 1321   VLDL 24 09/06/2017 1321   LDLCALC 57 10/24/2021 1159    Risk Assessment/Calculations:  {Does this patient have ATRIAL FIBRILLATION?:[id number]}  Home Medications   No outpatient medications have been marked as taking for the 12/21/22 encounter (Appointment) with Elgie Collard, PA-C.     Review of Systems   ***   All other systems reviewed and are otherwise negative except as noted above.  Physical Exam    VS:  There were no vitals taken for this visit. , BMI There is no height or weight on file to calculate BMI.  Wt Readings from Last 3 Encounters:  12/19/22 (!) 309 lb (140.2 kg)  12/15/22 (!) 302 lb 6.4 oz (137.2 kg)  11/21/22 297 lb (134.7 kg)     GEN: Well nourished, well developed, in no acute distress. HEENT: normal. Neck: Supple, no JVD, carotid bruits, or masses. Cardiac: ***RRR, no murmurs, rubs, or gallops. No clubbing, cyanosis, edema.  ***Radials/PT 2+ and equal bilaterally.  Respiratory:  ***Respirations regular and unlabored, clear to auscultation bilaterally. GI: Soft, nontender, nondistended. MS: No deformity or atrophy. Skin: Warm and dry, no rash. Neuro:  Strength and sensation are intact. Psych: Normal affect.  Assessment & Plan     Bradycardia Nonobstructive CAD History of SVT status post ablation 2015 by Dr. Lovena Le Hyperlipidemia  No BP recorded.  {Refresh Note OR Click here to enter BP  :1}***      Disposition: Follow up {follow up:15908} with Dorris Carnes, MD or APP.  Signed, Elgie Collard, PA-C 12/21/2022, 2:06 PM [place]

## 2022-12-21 NOTE — Telephone Encounter (Signed)
ATC X1 LVM for patient to call the office back 

## 2022-12-22 NOTE — Telephone Encounter (Signed)
ATC X1 and sent my chart message asking the patient to call the office back. Closing this encounter

## 2022-12-25 ENCOUNTER — Other Ambulatory Visit (HOSPITAL_COMMUNITY): Payer: No Typology Code available for payment source

## 2022-12-25 NOTE — Telephone Encounter (Signed)
Spoke with patient. Advised in Dr. Halford Chessman last note he has been cleared for surgery. He states he will stop by [employer] office Wednesday to a pick up note. NFN

## 2022-12-26 ENCOUNTER — Ambulatory Visit: Payer: Self-pay | Admitting: Physician Assistant

## 2022-12-26 NOTE — Progress Notes (Deleted)
Cardiology Office Note:    Date:  12/26/2022   ID:  GLENNON VANGORDER, DOB [birth year], MRN [id number]  PCP:  [name], [facility] Cardiologist:  Dorris Carnes, MD Electrophysiologist:  Cristopher Peru, MD { Click to update primary MD,subspecialty MD or APP then REFRESH:1}    Referring MD: [name], DO   Chief Complaint:  No chief complaint on file. {Click here for Visit Info    :1}    History of Present Illness:   Thomas Ball is a 62 y.o. male with history of SVT s/p ablation 2015, morbid obesity, reactive airway disease with mild obstruction on PFTs, diabetes mellitus, HCV, cirrhosis, pancreatitis, sleep apnea compliant with CPAP (followed by pulm), remote substance abuse with ongoing suboxone use who is seen for evaluation of slow HR. He also has had prior workup for CP/dyspnea with R/LHC in 09/2018 showing normal coronaries, normal LV function, normal LV filling pressures, and mild pulmonary HTN. He was seen again in 02/2022 for chest pain. F/u echo 03/2022 showed EF 55-60%, indeterminate diastolic parameters, moderate LAE, normal RV, mild RAE, no significant TR/pulm HTN noted. His CP was felt MSK in origin. In retrospect he felt this was stress related.   Patient saaw Dr. Lovena Le 10/2022 and was doing well on low dose flecainide.   No indication for pacemaker and was cleared for pending sarcoma surgery.         Past Medical History:  Diagnosis Date   GERD (gastroesophageal reflux disease)    HCV (hepatitis C virus) DR. ZACKS   AUG 2013 VIRAL LOAD UNDETECTABLE   History of cardiac catheterization    a. 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   Sleep apnea    SVT (supraventricular tachycardia)    Current Medications: No outpatient medications have been marked as taking for the 12/26/22 encounter (Appointment) with  [name], PA-C.    Allergies:   Patient has no known allergies.   Social History   Tobacco Use   Smoking status: Every Day    Packs/day: 2.00    Years: 45.00    Additional pack years: 0.00    Total pack years: 90.00    Types: Cigarettes   Smokeless tobacco: Never   Tobacco comments:    smokes 1-1.5 packs per day  Vaping Use   Vaping Use: Never used  Substance Use Topics   Alcohol use: No   Drug use: No    Comment: in the 1980s, smoke crack. None since Dec 2011.     Family Hx: The patient's family history includes COPD in his mother; Diabetes in his father; Early death in his mother; Emphysema in his mother; High Cholesterol in his father; Hyperlipidemia in his father; Stroke in his father. There is no history of Colon cancer.  ROS     Physical Exam:    VS:  There were no vitals taken for this visit.    Wt Readings from Last 3 Encounters:  12/19/22 (!) 309 lb (140.2 kg)  12/15/22 (!) 302 lb 6.4 oz (137.2 kg)  11/21/22 297 lb (134.7 kg)    Physical Exam  GEN: Well nourished, well developed, in no acute distress  HEENT: normal  Neck: no JVD, carotid bruits, or masses Cardiac:RRR; no murmurs, rubs, or gallops  Respiratory:  clear to  auscultation bilaterally, normal work of breathing GI: soft, nontender, nondistended, + BS Ext: without cyanosis, clubbing, or edema, Good distal pulses bilaterally MS: no deformity or atrophy  Skin: warm and dry, no rash Neuro:  Alert and Oriented x 3, Strength and sensation are intact Psych: euthymic mood, full affect        EKGs/Labs/Other Test Reviewed:    EKG:  EKG is *** ordered today.  The ekg ordered today demonstrates ***  Recent Labs: 03/21/2022: Pro B Natriuretic peptide (BNP) 192.0 04/28/2022: Hemoglobin 14.5; Platelets 168; TSH 0.940 11/07/2022: BUN 17; Creatinine, Ser 0.83; Potassium 4.5; Sodium 138   Recent Lipid Panel No results for input(s): "CHOL", "TRIG", "HDL", "VLDL", "LDLCALC", "LDLDIRECT" in the last  8760 hours.   Prior CV Studies: {Select studies to display:26339}    Monitor 05/17/22   Impression:     Predominent rhythm:  Sinus rhythm with rates of 42 to 99 bpm   Average SR rate 68 bpm   Rare PVCs  There were frequent supraventrular premature beats (14% total)   Rates went up to 211 bpm.   Fastest episode was 5 beats for average HR of 158 bpm   Longest was 1 min for average HR of 111 beats per minute.     Triggered events correlated with SR with isolated PACs      Echo 03/2022 IMPRESSIONS Left ventricular ejection fraction, by estimation, is 55 to 60%. The left ventricle has normal function. The left ventricle has no regional wall motion abnormalities. Left ventricular diastolic parameters are indeterminate. 1. 2. Right ventricular systolic function is normal. The right ventricular size is normal. 3. Left atrial size was moderately dilated. 4. Right atrial size was mildly dilated. The mitral valve is grossly normal. Trivial mitral valve regurgitation. No evidence of mitral stenosis. 5. The aortic valve is grossly normal. Aortic valve regurgitation is not visualized. No aortic stenosis is present. 6. The inferior vena cava is normal in size with greater than 50% respiratory variability, suggesting right atrial pressure of 3 mmHg. 7. Comparison(s): No significant change from prior study. Prior images reviewed side by side. Conclusion(s)/Recommendation(s): Normal biventricular function without evidence of hemodynamically significant valvular heart disease. FINDINGS Left Ventricle: Left ventricular ejection fraction, by estimation, is 55 to 60%. The left ventricle has normal function. The left ventricle has no regional wall motion abnormalities. Definity contrast agent was  Risk Assessment/Calculations/Metrics:   {Does this patient have ATRIAL FIBRILLATION?[phone]}     No BP recorded.  {Refresh Note OR Click here to enter BP  :1}***    ASSESSMENT & PLAN:   No  problem-specific Assessment & Plan notes found for this encounter.   Chest pain with normal coronaries cath 09/2018, chest pain 02/2022 felt to be MSK. Chest pain difficult to sort out but worse with exertion and multiple CV risk factors. Will order 2 day lexi.   SVT ablation 2015 followed by Dr. Lovena Le and maintained on flecainide.   OSA on CPAP   Morbid obesity exercise and weight loss discussed.   DM per PCP   Tobacco abuse smoking cessation discussed        {Are you ordering a CV Procedure (e.g. stress test, cath, DCCV, TEE, etc)?   Press F2        :[id number]   Dispo:  No follow-ups on file.   Medication Adjustments/Labs and Tests Ordered: Current medicines are reviewed at length with the patient today.  Concerns regarding medicines are outlined above.  Tests Ordered: No orders of the  defined types were placed in this encounter.  Medication Changes: No orders of the defined types were placed in this encounter.  Sumner Boast, PA-C  12/26/2022 7:51 AM    [facility] [address], [place], [place]  [address] Phone: [phone]; Fax: [phone]

## 2022-12-28 ENCOUNTER — Encounter: Payer: Self-pay | Admitting: Internal Medicine

## 2022-12-28 NOTE — Telephone Encounter (Signed)
Error

## 2022-12-29 ENCOUNTER — Telehealth: Payer: Self-pay | Admitting: Family Medicine

## 2023-01-08 ENCOUNTER — Other Ambulatory Visit: Payer: Self-pay | Admitting: Family Medicine

## 2023-01-08 NOTE — Telephone Encounter (Signed)
Gottschalk pt NTBS 30-d given 10/12/22

## 2023-01-09 NOTE — Telephone Encounter (Signed)
Scheduled patient to see PCP on 03/13/23 (first available). Needs refill sent in to last him until his appt.

## 2023-01-11 ENCOUNTER — Other Ambulatory Visit: Payer: Self-pay | Admitting: Family Medicine

## 2023-01-24 ENCOUNTER — Encounter: Payer: Self-pay | Admitting: Family Medicine

## 2023-01-24 ENCOUNTER — Ambulatory Visit: Payer: No Typology Code available for payment source | Admitting: Family Medicine

## 2023-01-24 ENCOUNTER — Ambulatory Visit (INDEPENDENT_AMBULATORY_CARE_PROVIDER_SITE_OTHER): Payer: No Typology Code available for payment source

## 2023-01-24 VITALS — BP 129/68 | HR 55 | Temp 97.5°F | Ht 66.0 in | Wt 310.0 lb

## 2023-01-24 DIAGNOSIS — M7752 Other enthesopathy of left foot: Secondary | ICD-10-CM

## 2023-01-24 DIAGNOSIS — M25572 Pain in left ankle and joints of left foot: Secondary | ICD-10-CM

## 2023-01-24 DIAGNOSIS — R21 Rash and other nonspecific skin eruption: Secondary | ICD-10-CM

## 2023-01-24 DIAGNOSIS — I499 Cardiac arrhythmia, unspecified: Secondary | ICD-10-CM

## 2023-01-24 MED ORDER — PREDNISONE 20 MG PO TABS
40.0000 mg | ORAL_TABLET | Freq: Every day | ORAL | 0 refills | Status: AC
Start: 2023-01-24 — End: 2023-01-29

## 2023-01-24 MED ORDER — PERMETHRIN 5 % EX CREA
1.0000 | TOPICAL_CREAM | Freq: Once | CUTANEOUS | 0 refills | Status: AC
Start: 2023-01-24 — End: 2023-01-24

## 2023-01-24 NOTE — Patient Instructions (Addendum)
Per prior visit with Darreld Mclean, MD from Ortho  Check tennis shoes and see if there is a small protrusion causing this issue.   Use Aspercreme, Biofreeze or voltaren gel to the area. Use heat as needed. This will take a while to resolve., months.

## 2023-01-24 NOTE — Progress Notes (Signed)
Acute Office Visit  Subjective:  Patient ID: Thomas Ball, male    DOB: 01/04/1961, 62 y.o.   MRN: [id number]  Chief Complaint  Patient presents with   left ankle and foot pain    Been going on for years. Worse since Saturday. Using IBP and ice elevating. Pain scale 8 right now. No injury he is aware of. No swelling or redness or heat he can tell. Surgery 2-3 weeks ago took out tumeur on the upper part of leg.    HPI Patient is in today for foot and ankle pain. Reports that he has pain from the knee down. Has been trying ice pack and ibuprofen and that hs been helping a little bit. It is worse with activity. Reports that he can barely get around. Denies fever, numbness, tingling. Denies open wounds. Started on Saturday and has continued every day. Reports that he has "had problems with it for a long time" Denies trauma. Reports that he had to stop while walking in from the parking lot because of the pain. Reports that he twisted it a long time ago and continued to walk on it and did not wear the boot because it was uncomfortable.   ROS As per HPI Objective:  BP 129/68   Pulse (!) 55   Temp (!) 97.5 F (36.4 C) (Temporal)   Ht 5\' 6"  (1.676 m)   Wt (!) 310 lb (140.6 kg)   SpO2 95%   BMI 50.04 kg/m   Physical Exam Constitutional:      General: He is not in acute distress.    Appearance: Normal appearance. He is not ill-appearing, toxic-appearing or diaphoretic.  Cardiovascular:     Rate and Rhythm: Normal rate and regular rhythm. Frequent Extrasystoles are present.    Pulses: Normal pulses.          Radial pulses are 2+ on the right side and 2+ on the left side.       Dorsalis pedis pulses are 2+ on the right side and 2+ on the left side.     Heart sounds: Normal heart sounds. No murmur heard.    No gallop.  Pulmonary:     Effort: Pulmonary effort is normal. No respiratory distress.     Breath sounds: Normal breath sounds. No stridor. No wheezing, rhonchi or rales.  Skin:     General: Skin is warm.     Capillary Refill: Capillary refill takes less than 2 seconds.  Neurological:     General: No focal deficit present.     Mental Status: He is alert and oriented to person, place, and time. Mental status is at baseline.     Motor: No weakness.  Psychiatric:        Mood and Affect: Mood normal.        Behavior: Behavior normal.        Thought Content: Thought content normal.        Judgment: Judgment normal.         01/24/2023    9:51 AM 12/19/2022    9:54 AM 10/06/2022   12:59 PM  Depression screen PHQ 2/9  Decreased Interest 0 0 0  Down, Depressed, Hopeless 0 0 0  PHQ - 2 Score 0 0 0  Altered sleeping 0 0   Tired, decreased energy 0 0   Change in appetite 0 0   Feeling bad or failure about yourself  0 0   Trouble concentrating 0 0   Moving slowly or  fidgety/restless 0 0   Suicidal thoughts 0 0   PHQ-9 Score 0 0   Difficult doing work/chores Not difficult at all Not difficult at all       01/24/2023    9:51 AM 12/19/2022    9:54 AM 10/06/2022   12:59 PM 04/17/2022   10:16 AM  GAD 7 : Generalized Anxiety Score  Nervous, Anxious, on Edge 0 0 0 0  Control/stop worrying 0 0 0 0  Worry too much - different things 0 0 0 0  Trouble relaxing 0 0 0 0  Restless 0 0 0 0  Easily annoyed or irritable 0 0 0 0  Afraid - awful might happen 0 0 0 0  Total GAD 7 Score 0 0 0 0  Anxiety Difficulty Not difficult at all  Not difficult at all Not difficult at all    Assessment & Plan:  1. Acute left ankle pain 2. Left calcaneal bursitis Medication as below. Patient has previously been seen by ortho for calcaneal bursitis. He was instructed to follow up and he is still established with ortho. Instructed patient to follow recommendations from ortho. Provided written instructions. Will call patient with results of xray.  - predniSONE (DELTASONE) 20 MG tablet; Take 2 tablets (40 mg total) by mouth daily with breakfast for 5 days.  Dispense: 10 tablet; Refill: 0 - DG  Ankle Complete Left; Future - Ambulatory referral to Physical Therapy  3. Rash Will trial dose of permethrin as below for rash. Differential diagnosis includes scabies and bed bugs. Instructed patient to follow up if rash does not resolve with medication as below.  - permethrin (ELIMITE) 5 % cream; Apply 1 Application topically once for 1 dose. leave on for 8 to 14 hours before removing by washing (shower or bath)  Dispense: 30 g; Refill: 0  4. Irregular heart beat Patient has a history of SVT and is established with Cardiology. Reported that he takes his Flecainide religiously. Rate controlled today. Instructed patient to follow up with specialist.   The above assessment and management plan was discussed with the patient. The patient verbalized understanding of and has agreed to the management plan using shared-decision making. Patient is aware to call the clinic if they develop any new symptoms or if symptoms fail to improve or worsen. Patient is aware when to return to the clinic for a follow-up visit. Patient educated on when it is appropriate to go to the emergency department.   Neale Burly, DNP-FNP [facility] [address] [place], [place] [address] [phone]

## 2023-02-02 NOTE — Progress Notes (Deleted)
Office Visit    Patient Name: Thomas Ball Date of Encounter: 02/02/2023  Primary Care Provider:  Raliegh Ip, DO Primary Cardiologist:  Dietrich Pates, MD Primary Electrophysiologist: Lewayne Bunting, MD   Past Medical History    Past Medical History:  Diagnosis Date   GERD (gastroesophageal reflux disease)    HCV (hepatitis C virus) Thomas Ball   AUG 2013 VIRAL LOAD UNDETECTABLE   History of cardiac catheterization    a. 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   CHOLECYSTECTOMY     COLONOSCOPY  10/04/2012   SLF:ONE/8 SIMPLE ADENOMA, TICS, SML IH   ESOPHAGOGASTRODUODENOSCOPY N/A 09/04/2014   Procedure: ESOPHAGOGASTRODUODENOSCOPY (EGD);  Surgeon: Thomas Bali, MD;  [facility];  Service: Endoscopy;  Laterality: N/A;  145   GALLBLADDER SURGERY     JAW BROKEN     LIPOMA REMOVALS     SEVERAL   RIGHT/LEFT HEART CATH AND CORONARY ANGIOGRAPHY N/A 10/08/2018   Procedure: RIGHT/LEFT HEART CATH AND CORONARY ANGIOGRAPHY;  Surgeon: Ball, Thomas M, MD;  Location: [facility] INVASIVE CV LAB;  Service: Cardiovascular;  Laterality: N/A;   SUPRAVENTRICULAR TACHYCARDIA ABLATION N/A 06/22/2014   Procedure: SUPRAVENTRICULAR TACHYCARDIA ABLATION;  Surgeon: Thomas Maw, MD;  Location: [facility] CATH LAB;  Service: Cardiovascular;  Laterality: N/A;    Allergies  No Known Allergies   History of Present Illness    Thomas Ball is a 62 y.o. male with PMH of nonobstructive CAD HTN, HLD, PSVT s/p SVT ablation in 2015, OSA, tobacco use, DM, cirrhosis, pancreatitis who presents today for 22-month follow-up.  Thomas Ball was initially seen 04/21/2014 for consultation of tachycardia.  EKG was completed showing  junctional tachycardia, troponins were also normal.  Chest CT was completed and ruled out pulmonary embolism.  He underwent Lexiscan Myoview that showed no evidence of ischemia.  2D echo was also completed with normal LV function.  He was referred to EP and underwent SVT ablation on 05/2014.  He was seen in follow-up 07/2017 with complaints of chest pain and underwent Lexiscan that was negative for ischemia.  He presented to the ED on 08/2018 with continued chest discomfort and troponins were negative BNP was mildly elevated and ECG showed no acute changes.  He was seen in follow-up by Thomas An, PA on 09/2018 with no change to intermittent chest discomfort.  He underwent LHC performed by Thomas. [place] on 09/2018 that revealed normal coronary anatomy with normal LV function and LV filling pressures.  He was noted to have mild pulmonary hypertension and medical management was recommended.  He was last seen by Thomas. Ladona Ball on 11/21/2022 for preoperative clearance of sarcoma surgery.  During his visit blood pressure was well-controlled and he denied any episodes of dizziness or syncope.        Since last being seen in the office patient reports***.  Patient denies chest pain, palpitations, dyspnea, PND, orthopnea, nausea, vomiting, dizziness, syncope, edema, weight gain, or early satiety.     ***Notes:  Home Medications    Current Outpatient Medications  Medication Sig Dispense Refill   albuterol (PROVENTIL) (2.5 MG/3ML) 0.083% nebulizer solution Take 3  mLs (2.5 mg total) by nebulization every 6 (six) hours as needed for wheezing or shortness of breath. 150 mL 1   albuterol (VENTOLIN HFA) 108 (90 Base) MCG/ACT inhaler Inhale 2 puffs into the lungs every 6 (six) hours as needed for wheezing or shortness of breath. 8 g 2   atorvastatin (LIPITOR) 40 MG tablet Take 1 tablet (40 mg total) by mouth daily. 30 tablet 1   doxazosin (CARDURA) 1 MG tablet Take 1 tablet (1 mg total) by mouth daily. 90 tablet 1    flecainide (TAMBOCOR) 50 MG tablet Take 1 tablet (50 mg total) by mouth 2 (two) times daily. 180 tablet 3   ibuprofen (ADVIL) 200 MG tablet Take by mouth.  Take 200 mg by mouth every 8 (eight) hours as needed.     loratadine (CLARITIN) 10 MG tablet Take 1 tablet (10 mg total) by mouth daily. (Patient not taking: Reported on 01/24/2023) 30 tablet 11   metFORMIN (GLUCOPHAGE) 500 MG tablet TAKE ONE TABLET BY MOUTH TWICE DAILY 180 tablet 0   omeprazole (PRILOSEC) 40 MG capsule TAKE ONE (1) CAPSULE EACH DAY 90 capsule 1   Tiotropium Bromide-Olodaterol (STIOLTO RESPIMAT) 2.5-2.5 MCG/ACT AERS Inhale 2 puffs into the lungs daily. (Patient not taking: Reported on 01/24/2023) 1 each 5   No current facility-administered medications for this visit.     Review of Systems  Please see the history of present illness.    (+)*** (+)***  All other systems reviewed and are otherwise negative except as noted above.  Physical Exam    Wt Readings from Last 3 Encounters:  01/24/23 (!) 310 lb (140.6 kg)  12/19/22 (!) 309 lb (140.2 kg)  12/15/22 (!) 302 lb 6.4 oz (137.2 kg)   ZO:XWRUE were no vitals filed for this visit.,There is no height or weight on file to calculate BMI.  Constitutional:      Appearance: Healthy appearance. Not in distress.  Neck:     Vascular: JVD normal.  Pulmonary:     Effort: Pulmonary effort is normal.     Breath sounds: No wheezing. No rales. Diminished in the bases Cardiovascular:     Normal rate. Regular rhythm. Normal S1. Normal S2.      Murmurs: There is no murmur.  Edema:    Peripheral edema absent.  Abdominal:     Palpations: Abdomen is soft non tender. There is no hepatomegaly.  Skin:    General: Skin is warm and dry.  Neurological:     General: No focal deficit present.     Mental Status: Alert and oriented to person, place and time.     Cranial Nerves: Cranial nerves are intact.  EKG/LABS/ Recent Cardiac Studies    ECG personally reviewed by me today -  ***  Cardiac Studies & Procedures   CARDIAC CATHETERIZATION  CARDIAC CATHETERIZATION 10/08/2018  Narrative  The left ventricular systolic function is normal.  LV end diastolic pressure is normal.  The left ventricular ejection fraction is 55-65% by visual estimate.  Hemodynamic findings consistent with mild pulmonary hypertension.  1. Normal coronary anatomy 2. Normal LV function 3. Normal LV filling pressures 4. Mild pulmonary HTN 5. Normal cardiac output.  Plan: medical management.  Findings Coronary Findings Diagnostic  Dominance: Right  Left Main Vessel was injected. Vessel is normal in caliber. Vessel is angiographically normal.  Left Anterior Descending Vessel was injected. Vessel is normal in caliber. Vessel is angiographically normal.  Left Circumflex Vessel was injected. Vessel is normal in caliber. Vessel  is angiographically normal.  Right Coronary Artery Vessel was injected. Vessel is small. Vessel is angiographically normal.  Intervention  No interventions have been documented.   STRESS TESTS  NM MYOCAR MULTI W/SPECT W 06/21/2022  Narrative   The study is normal. The study is low risk.   No ST deviation was noted.   LV perfusion is normal. There is no evidence of ischemia. There is no evidence of infarction.   Left ventricular function is normal. End diastolic cavity size is normal. End systolic cavity size is normal.  Ejection fraction is 63%.   ECHOCARDIOGRAM  ECHOCARDIOGRAM COMPLETE 03/30/2022  Narrative ECHOCARDIOGRAM REPORT    Patient Name:   TYAUN MIRO Date of Exam: 03/30/2022 Medical Rec #:  [id number]       Height:       65.0 in Accession #:    [id number]      Weight:       304.0 lb Date of Birth:  March 26, 1961      BSA:          2.363 Ball Patient Age:    60 years        BP:           117/61 mmHg Patient Gender: Ball               HR:           49 bpm. Exam Location:  [address]  Procedure: 2D Echo, Cardiac Doppler, Color Doppler  and Intracardiac Opacification Agent  Indications:    R06.00 Dyspnea  History:        Patient has prior history of Echocardiogram examinations, most recent 09/15/2018. Signs/Symptoms:Chest Pain and Shortness of Breath; Risk Factors:Hypertension, Diabetes, Dyslipidemia and Sleep Apnea. SVT. Bradycardia. Morbid obesity.  Sonographer:    Cathie Beams RCS Referring Phys: [id number] Earnstine Regal [facility]  IMPRESSIONS   1. Left ventricular ejection fraction, by estimation, is 55 to 60%. The left ventricle has normal function. The left ventricle has no regional wall motion abnormalities. Left ventricular diastolic parameters are indeterminate. 2. Right ventricular systolic function is normal. The right ventricular size is normal. 3. Left atrial size was moderately dilated. 4. Right atrial size was mildly dilated. 5. The mitral valve is grossly normal. Trivial mitral valve regurgitation. No evidence of mitral stenosis. 6. The aortic valve is grossly normal. Aortic valve regurgitation is not visualized. No aortic stenosis is present. 7. The inferior vena cava is normal in size with greater than 50% respiratory variability, suggesting right atrial pressure of 3 mmHg.  Comparison(s): No significant change from prior study. Prior images reviewed side by side.  Conclusion(s)/Recommendation(s): Normal biventricular function without evidence of hemodynamically significant valvular heart disease.  FINDINGS Left Ventricle: Left ventricular ejection fraction, by estimation, is 55 to 60%. The left ventricle has normal function. The left ventricle has no regional wall motion abnormalities. Definity contrast agent was given IV to delineate the left ventricular endocardial borders. The left ventricular internal cavity size was normal in size. There is borderline left ventricular hypertrophy. Left ventricular diastolic parameters are indeterminate. Normal left ventricular filling pressure.  Right Ventricle: The  right ventricular size is normal. No increase in right ventricular wall thickness. Right ventricular systolic function is normal.  Left Atrium: Left atrial size was moderately dilated.  Right Atrium: Right atrial size was mildly dilated.  Pericardium: There is no evidence of pericardial effusion. Presence of epicardial fat layer.  Mitral Valve: The mitral valve is grossly normal. Trivial mitral valve regurgitation.  No evidence of mitral valve stenosis.  Tricuspid Valve: The tricuspid valve is grossly normal. Tricuspid valve regurgitation is trivial. No evidence of tricuspid stenosis.  Aortic Valve: The aortic valve is grossly normal. Aortic valve regurgitation is not visualized. No aortic stenosis is present.  Pulmonic Valve: The pulmonic valve was not well visualized. Pulmonic valve regurgitation is mild. No evidence of pulmonic stenosis.  Aorta: The aortic root, ascending aorta, aortic arch and descending aorta are all structurally normal, with no evidence of dilitation or obstruction.  Venous: The inferior vena cava is normal in size with greater than 50% respiratory variability, suggesting right atrial pressure of 3 mmHg.  IAS/Shunts: The atrial septum is grossly normal.   LEFT VENTRICLE PLAX 2D LVIDd:         5.50 cm   Diastology LVIDs:         3.90 cm   LV e' medial:    8.43 cm/s LV PW:         1.05 cm   LV E/e' medial:  12.0 LV IVS:        1.10 cm   LV e' lateral:   13.50 cm/s LVOT diam:     2.30 cm   LV E/e' lateral: 7.5 LV SV:         103 LV SV Index:   43 LVOT Area:     4.15 cm   RIGHT VENTRICLE RV Basal diam:  3.50 cm RV S prime:     15.10 cm/s TAPSE (Ball-mode): 3.1 cm  LEFT ATRIUM             Index        RIGHT ATRIUM           Index LA diam:        4.60 cm 1.95 cm/Ball   RA Area:     21.90 cm LA Vol (A2C):   62.5 ml 26.44 ml/Ball  RA Volume:   68.10 ml  28.81 ml/Ball LA Vol (A4C):   90.8 ml 38.42 ml/Ball LA Biplane Vol: 81.7 ml 34.57 ml/Ball AORTIC VALVE LVOT Vmax:    113.00 cm/s LVOT Vmean:  71.300 cm/s LVOT VTI:    0.247 Ball  AORTA Ao Root diam: 3.70 cm Ao Asc diam:  3.50 cm  MITRAL VALVE MV Area (PHT): 4.60 cm     SHUNTS MV Decel Time: 165 msec     Systemic VTI:  0.25 Ball MV E velocity: 101.00 cm/s  Systemic Diam: 2.30 cm MV A velocity: 73.90 cm/s MV E/A ratio:  1.37  Jodelle Red MD Electronically signed by Jodelle Red MD Signature Date/Time: 03/30/2022/1:12:32 PM    Final    MONITORS  LONG TERM MONITOR-LIVE TELEMETRY (3-14 DAYS) 05/19/2022  Narrative Patch Wear Time:  8 days and 6 hours (2023-08-09T05:57:58-0400 to 2023-08-17T12:52:06-399)  Impression:  Predominent rhythm:  Sinus rhythm with rates of 42 to 99 bpm   Average SR rate 68 bpm   Rare PVCs  There were frequent supraventrular premature beats (14% total)   Rates went up to 211 bpm.   Fastest episode was 5 beats for average HR of 158 bpm   Longest was 1 min for average HR of 111 beats per minute. Triggered events correlated with SR with isolated PACs           Risk Assessment/Calculations:   {Does this patient have ATRIAL FIBRILLATION?:[id number]}        Lab Results  Component Value Date   WBC 5.6 04/28/2022   HGB 14.5  04/28/2022   HCT 42.6 04/28/2022   MCV 93 04/28/2022   PLT 168 04/28/2022   Lab Results  Component Value Date   CREATININE 0.83 11/07/2022   BUN 17 11/07/2022   NA 138 11/07/2022   K 4.5 11/07/2022   CL 104 11/07/2022   CO2 21 11/07/2022   Lab Results  Component Value Date   ALT 21 10/24/2021   AST 18 10/24/2021   ALKPHOS 25 (L) 10/24/2021   BILITOT 1.0 10/24/2021   Lab Results  Component Value Date   CHOL 109 10/24/2021   HDL 39 (L) 10/24/2021   LDLCALC 57 10/24/2021   TRIG 60 10/24/2021   CHOLHDL 2.8 10/24/2021    Lab Results  Component Value Date   HGBA1C 6.5 (H) 10/06/2022     Assessment & Plan    1.  Bradycardia: -Patient currently followed by EP and was started on flecainide 50 mg twice daily during  previous visit. -Today patient reports no dizziness, chest pain, presyncopal symptoms with slow heart rate. -Patient's EKG was reviewed and showed no evidence of pauses or bradycardia today.   2.  Nonobstructive CAD: -Most recent ischemic evaluation done by LHC in 2020 that revealed revealed normal coronary anatomy with normal LV function and LV filling pressures. -Today patient reports no chest pain or anginal equivalent since previous visit.   3.  History of SVT: -s/p ablation performed 2015 by Thomas. Ladona Ball. -EKG today shows sinus rhythm with premature atrial complex and patient denies any bouts of tachycardia or palpitations since previous   4.  Hyperlipidemia: -Patient's LDL cholesterol was 57 -Continue Lipitor 40 mg daily      Disposition: Follow-up with Dietrich Pates, MD or APP in *** months {Are you ordering a CV Procedure (e.g. stress test, cath, DCCV, TEE, etc)?   Press F2        :[id number]}   Medication Adjustments/Labs and Tests Ordered: Current medicines are reviewed at length with the patient today.  Concerns regarding medicines are outlined above.   Signed, Napoleon Form, Leodis Rains, NP 02/02/2023, 8:56 AM [facility]

## 2023-02-05 ENCOUNTER — Ambulatory Visit: Payer: Self-pay | Admitting: Nurse Practitioner

## 2023-02-05 NOTE — Progress Notes (Deleted)
HPI  Patient presents for follow-up of chest pain  [name] is a 62 year old gentleman with a history of SVT, morbid obesity, reactive airway disease, diabetes.  I saw him back in 2015.  He was diagnosed with SVT and underwent ablation.  He has been followed by Sharrell Ku.  Last clinic visit was back in June.  Note cardiac catheterization in  showed normal coronary arteries.  Normal LV function.  The patient said in the fall he had a bronchitis.  He had a lot of coughing for a couple of months.  After this he developed some upper chest pain that he attributed to the coughing.  In January he still had some discomfort.  This is actually resolved.  When he talks about the pain he said he does not occur with any specific specific activity.  It is not pleuritic.  May be a little positional.  Again has improved.  The patient denies other types of chest pain.  Breathing is stable.  It has been a difficult year, patient still mourning the loss of his wife back in April 2022 from cancer.  He notes over the holidays he gained back the weight he had lost in the fall.  He is working to get back into a diet routine.  Diet     Breakfast   Skips Coke 0 Lunch    Salad   Zucchini, squash     Dinner    Salad   Chicken or tuna  I saw the pt in March 2023   Since then he has been seen by several APPs/MDs   Last was Feb 2024  by Rosette Reveal No Known Allergies   Current Outpatient Medications  Medication Sig Dispense Refill   albuterol (PROVENTIL) (2.5 MG/3ML) 0.083% nebulizer solution Take 3 mLs (2.5 mg total) by nebulization every 6 (six) hours as needed for wheezing or shortness of breath. 150 mL 1   albuterol (VENTOLIN HFA) 108 (90 Base) MCG/ACT inhaler Inhale 2 puffs into the lungs every 6 (six) hours as needed for wheezing or shortness of breath. 8 g 2   atorvastatin (LIPITOR) 40 MG tablet Take 1 tablet (40 mg total) by mouth daily. 30 tablet 1   doxazosin (CARDURA) 1 MG tablet Take 1 tablet (1  mg total) by mouth daily. 90 tablet 1   flecainide (TAMBOCOR) 50 MG tablet Take 1 tablet (50 mg total) by mouth 2 (two) times daily. 180 tablet 3   ibuprofen (ADVIL) 200 MG tablet Take by mouth.  Take 200 mg by mouth every 8 (eight) hours as needed.     loratadine (CLARITIN) 10 MG tablet Take 1 tablet (10 mg total) by mouth daily. (Patient not taking: Reported on 01/24/2023) 30 tablet 11   metFORMIN (GLUCOPHAGE) 500 MG tablet TAKE ONE TABLET BY MOUTH TWICE DAILY 180 tablet 0   omeprazole (PRILOSEC) 40 MG capsule TAKE ONE (1) CAPSULE EACH DAY 90 capsule 1   Tiotropium Bromide-Olodaterol (STIOLTO RESPIMAT) 2.5-2.5 MCG/ACT AERS Inhale 2 puffs into the lungs daily. (Patient not taking: Reported on 01/24/2023) 1 each 5   No current facility-administered medications for this visit.     Past Medical History:  Diagnosis Date   GERD (gastroesophageal reflux disease)    HCV (hepatitis C virus) DR. ZACKS   AUG 2013 VIRAL LOAD UNDETECTABLE   History of cardiac catheterization    a. normal cors by cath in 09/2018 with mild pulmonary HTN   Hypercholesteremia    Hypertension  Pancreatitis, gallstone    Skin cancer 1992   Removed from side of head   Sleep apnea    SVT (supraventricular tachycardia)     ROS:   All systems reviewed and negative except as noted in the HPI.   Past Surgical History:  Procedure Laterality Date   ABLATION  06-22-14   AVNRT ablation by Dr Ladona Ridgel   CHOLECYSTECTOMY     COLONOSCOPY  10/04/2012   SLF:ONE/8 SIMPLE ADENOMA, TICS, SML IH   ESOPHAGOGASTRODUODENOSCOPY N/A 09/04/2014   Procedure: ESOPHAGOGASTRODUODENOSCOPY (EGD);  Surgeon: West Bali, MD;  Location: [facility];  Service: Endoscopy;  Laterality: N/A;  145   GALLBLADDER SURGERY     JAW BROKEN     LIPOMA REMOVALS     SEVERAL   RIGHT/LEFT HEART CATH AND CORONARY ANGIOGRAPHY N/A 10/08/2018   Procedure: RIGHT/LEFT HEART CATH AND CORONARY ANGIOGRAPHY;  Surgeon: [place], Peter M, MD;  Location: [facility] INVASIVE CV  LAB;  Service: Cardiovascular;  Laterality: N/A;   SUPRAVENTRICULAR TACHYCARDIA ABLATION N/A 06/22/2014   Procedure: SUPRAVENTRICULAR TACHYCARDIA ABLATION;  Surgeon: Marinus Maw, MD;  Location: [facility] CATH LAB;  Service: Cardiovascular;  Laterality: N/A;     Family History  Problem Relation Age of Onset   Emphysema Mother    COPD Mother    Early death Mother    Hyperlipidemia Father    Stroke Father    Diabetes Father    High Cholesterol Father    Colon cancer Neg Hx      Social History   Socioeconomic History   Marital status: Widowed    Spouse name: Not on file   Number of children: Not on file   Years of education: Not on file   Highest education level: Not on file  Occupational History   Occupation: concrete work    Associate Professor: SELF-EMPLOYED  Tobacco Use   Smoking status: Every Day    Packs/day: 2.00    Years: 45.00    Additional pack years: 0.00    Total pack years: 90.00    Types: Cigarettes   Smokeless tobacco: Never   Tobacco comments:    smokes 1-1.5 packs per day  Vaping Use   Vaping Use: Never used  Substance and Sexual Activity   Alcohol use: No   Drug use: No    Comment: in the 1980s, smoke crack. None since Dec 2011.    Sexual activity: Not Currently  Other Topics Concern   Not on file  Social History Narrative   Not on file   Social Determinants of Health   Financial Resource Strain: Not on file  Food Insecurity: Not on file  Transportation Needs: Not on file  Physical Activity: Not on file  Stress: Not on file  Social Connections: Not on file  Intimate Partner Violence: Not on file     There were no vitals taken for this visit.  Physical Exam:  Morbidly obese 62 year old appearing NAD HEENT: Unremarkable Neck:  No JVD, no thyromegally.  No bruits Lungs:  Clear with no wheezes HEART:  Regular rate rhythm, no murmurs Abd:  soft, morbidly obese, positive bowel sounds, no organomegally, nontender  Ext:  2 plus pulses,  edema, no  cyanosis,Skin:  No rashes no nodules Neuro:  CN II through XII intact, motor grossly intact   Assess/Plan:  1.  Chest pain.  Chest pain I think is probably musculoskeletal in origin.  I do not think cardiac.  Most likely related to the coughing he did last fall.  Follow.  History of normal  2.  History of SVT.  Patient is status post ablation.  No recurrence. 3.. Obesity -morbid obesity.  Congratulated on the loss he had.  I think he can get back down.  His goal for this year is 240.  I think he can do this limit carbs.    4.  Diabetes.  Needs better control.  Again diet.    3. Dyspnea -chronic.  No change.  Most likely related to size, deconditioning.   Pricilla Riffle, MD.

## 2023-02-06 ENCOUNTER — Ambulatory Visit: Payer: Self-pay | Admitting: Internal Medicine

## 2023-02-06 ENCOUNTER — Ambulatory Visit: Payer: Self-pay

## 2023-02-06 NOTE — Therapy (Deleted)
OUTPATIENT PHYSICAL THERAPY LOWER EXTREMITY EVALUATION   Patient Name: Thomas Ball MRN: [id number] DOB:[birth year], 62 y.o., male Today's Date: 02/06/2023  END OF SESSION:   Past Medical History:  Diagnosis Date   GERD (gastroesophageal reflux disease)    HCV (hepatitis C virus) DR. ZACKS   AUG 2013 VIRAL LOAD UNDETECTABLE   History of cardiac catheterization    a. 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   CHOLECYSTECTOMY     COLONOSCOPY  10/04/2012   SLF:ONE/8 SIMPLE ADENOMA, TICS, SML IH   ESOPHAGOGASTRODUODENOSCOPY N/A 09/04/2014   Procedure: ESOPHAGOGASTRODUODENOSCOPY (EGD);  Surgeon: West Bali, MD;  Location: [facility];  Service: Endoscopy;  Laterality: N/A;  145   GALLBLADDER SURGERY     JAW BROKEN     LIPOMA REMOVALS     SEVERAL   RIGHT/LEFT HEART CATH AND CORONARY ANGIOGRAPHY N/A 10/08/2018   Procedure: RIGHT/LEFT HEART CATH AND CORONARY ANGIOGRAPHY;  Surgeon: Swazil[place], Peter M, MD;  Location: [facility] INVASIVE CV LAB;  Service: Cardiovascular;  Laterality: N/A;   SUPRAVENTRICULAR TACHYCARDIA ABLATION N/A 06/22/2014   Procedure: SUPRAVENTRICULAR TACHYCARDIA ABLATION;  Surgeon: Marinus Maw, MD;  Location: [facility] CATH LAB;  Service: Cardiovascular;  Laterality: N/A;   Patient Active Problem List   Diagnosis Date Noted   Hyperlipidemia associated with type 2 diabetes mellitus (HCC) 04/29/2021   Uncontrolled type 2 diabetes mellitus with hyperglycemia (HCC) 04/29/2021   Headache in front of head 12/07/2020   Panic attacks 12/07/2020   Chest congestion 12/07/2020   Pure hypercholesterolemia 01/12/2020   Dyspnea    SOB (shortness of breath) 09/15/2018   Essential hypertension  04/26/2018   OSA (obstructive sleep apnea) 03/04/2018   BPH (benign prostatic hyperplasia) 10/07/2015   EIC (epidermal inclusion cyst) 07/23/2015   Tobacco abuse 07/02/2015   SVT (supraventricular tachycardia) (HCC)    Intermittent palpitations 04/21/2014   Bradycardia 04/21/2014   Morbid obesity (HCC) 04/21/2014   Chest pain 04/20/2014   Cirrhosis of liver (HCC) 11/05/2013   GERD (gastroesophageal reflux disease) 07/18/2012   Screening for colorectal cancer 07/18/2012   Pulmonary nodule 02/14/2012    PCP: ***  REFERRING PROVIDER: Arrie Senate, FNP   REFERRING DIAG: Acute left ankle pain   THERAPY DIAG:  No diagnosis found.  Rationale for Evaluation and Treatment: Rehabilitation  ONSET DATE: ***  SUBJECTIVE:   SUBJECTIVE STATEMENT: ***  PERTINENT HISTORY: *** PAIN:  Are you having pain? {OPRCPAIN:27236}  PRECAUTIONS: {Therapy precautions:24002}  WEIGHT BEARING RESTRICTIONS: {Yes ***/No:24003}  FALLS:  Has patient fallen in last 6 months? {fallsyesno:27318}  LIVING ENVIRONMENT: Lives with: {OPRC lives with:25569::"lives with their family"} Lives in: {Lives in:25570} Stairs: {opstairs:27293} Has following equipment at home: {Assistive devices:23999}  OCCUPATION: ***  PLOF: {PLOF:24004}  PATIENT GOALS: ***  NEXT MD VISIT: ***  OBJECTIVE:   DIAGNOSTIC FINDINGS: ***  PATIENT SURVEYS:  {rehab surveys:24030}  COGNITION: Overall cognitive status: {cognition:24006}     SENSATION: {sensation:27233}  EDEMA:  {edema:24020}  MUSCLE LENGTH: Hamstrings: Right *** deg; Left *** deg Thomas test: Right *** deg; Left *** deg  POSTURE: {posture:25561}  PALPATION: ***  LOWER EXTREMITY ROM:  {AROM/PROM:27142} ROM Right eval Left  eval  Hip flexion    Hip extension    Hip abduction    Hip adduction    Hip internal rotation    Hip external rotation    Knee flexion    Knee extension    Ankle dorsiflexion    Ankle plantarflexion     Ankle inversion    Ankle eversion     (Blank rows = not tested)  LOWER EXTREMITY MMT:  MMT Right eval Left eval  Hip flexion    Hip extension    Hip abduction    Hip adduction    Hip internal rotation    Hip external rotation    Knee flexion    Knee extension    Ankle dorsiflexion    Ankle plantarflexion    Ankle inversion    Ankle eversion     (Blank rows = not tested)  LOWER EXTREMITY SPECIAL TESTS:  {LEspecialtests:26242}  FUNCTIONAL TESTS:  {Functional tests:24029}  GAIT: Distance walked: *** Assistive device utilized: {Assistive devices:23999} Level of assistance: {Levels of assistance:24026} Comments: ***   TODAY'S TREATMENT:                                                                                                                              DATE: ***    PATIENT EDUCATION:  Education details: *** Person educated: {Person educated:25204} Education method: {Education Method:25205} Education comprehension: {Education Comprehension:25206}  HOME EXERCISE PROGRAM: ***  ASSESSMENT:  CLINICAL IMPRESSION: Patient is a 62 y.o. male who was seen today for physical therapy evaluation and treatment for ***.   OBJECTIVE IMPAIRMENTS: {opptimpairments:25111}.   ACTIVITY LIMITATIONS: {activitylimitations:27494}  PARTICIPATION LIMITATIONS: {participationrestrictions:25113}  PERSONAL FACTORS: {Personal factors:25162} are also affecting patient's functional outcome.   REHAB POTENTIAL: {rehabpotential:25112}  CLINICAL DECISION MAKING: {clinical decision making:25114}  EVALUATION COMPLEXITY: {Evaluation complexity:25115}   GOALS: Goals reviewed with patient? {yes/no:20286}  SHORT TERM GOALS: Target date: *** *** Baseline: Goal status: {GOALSTATUS:25110}  2.  *** Baseline:  Goal status: {GOALSTATUS:25110}  3.  *** Baseline:  Goal status: {GOALSTATUS:25110}  4.  *** Baseline:  Goal status: {GOALSTATUS:25110}  5.  *** Baseline:  Goal  status: {GOALSTATUS:25110}  6.  *** Baseline:  Goal status: {GOALSTATUS:25110}  LONG TERM GOALS: Target date: ***  *** Baseline:  Goal status: {GOALSTATUS:25110}  2.  *** Baseline:  Goal status: {GOALSTATUS:25110}  3.  *** Baseline:  Goal status: {GOALSTATUS:25110}  4.  *** Baseline:  Goal status: {GOALSTATUS:25110}  5.  *** Baseline:  Goal status: {GOALSTATUS:25110}  6.  *** Baseline:  Goal status: {GOALSTATUS:25110}   PLAN:  PT FREQUENCY: {rehab frequency:25116}  PT DURATION: {rehab duration:25117}  PLANNED INTERVENTIONS: {rehab planned interventions:25118::"Therapeutic exercises","Therapeutic activity","Neuromuscular re-education","Balance training","Gait training","Patient/Family education","Self Care","Joint mobilization"}  PLAN FOR NEXT SESSION: Granville Lewis, PT 02/06/2023, 8:13 AM

## 2023-02-08 ENCOUNTER — Other Ambulatory Visit: Payer: Self-pay | Admitting: Family Medicine

## 2023-02-08 ENCOUNTER — Telehealth: Payer: Self-pay | Admitting: Family Medicine

## 2023-02-08 NOTE — Telephone Encounter (Signed)
Samples up front patient aware  

## 2023-02-12 ENCOUNTER — Other Ambulatory Visit: Payer: Self-pay | Admitting: Family Medicine

## 2023-02-12 DIAGNOSIS — E119 Type 2 diabetes mellitus without complications: Secondary | ICD-10-CM

## 2023-02-13 ENCOUNTER — Telehealth: Payer: Self-pay | Admitting: Internal Medicine

## 2023-02-13 MED ORDER — STIOLTO RESPIMAT 2.5-2.5 MCG/ACT IN AERS: 2.0000 | INHALATION_SPRAY | Freq: Every day | RESPIRATORY_TRACT | 0 refills | Status: DC

## 2023-02-13 NOTE — Telephone Encounter (Signed)
2 samples of Stiolto up front for pick up  Pt aware  Nothing further needed

## 2023-03-07 ENCOUNTER — Telehealth: Payer: Self-pay | Admitting: Family Medicine

## 2023-03-09 ENCOUNTER — Ambulatory Visit (INDEPENDENT_AMBULATORY_CARE_PROVIDER_SITE_OTHER): Payer: Self-pay | Admitting: Family Medicine

## 2023-03-09 VITALS — BP 109/65 | HR 99 | Temp 97.4°F | Ht 66.0 in | Wt 300.8 lb

## 2023-03-09 DIAGNOSIS — M79604 Pain in right leg: Secondary | ICD-10-CM

## 2023-03-09 MED ORDER — MELOXICAM 15 MG PO TABS
15.0000 mg | ORAL_TABLET | Freq: Every day | ORAL | 0 refills | Status: DC
Start: 2023-03-09 — End: 2023-03-22

## 2023-03-09 NOTE — Progress Notes (Signed)
   Acute Office Visit  Subjective:     Patient ID: Thomas Ball, male    DOB: [birth year], 62 y.o.   MRN: [id number]  Chief Complaint  Patient presents with   Leg Pain    Right leg pain started about 5 days ago. Took ibuprofen this morning and has helped some.     Leg Pain  The incident occurred 5 to 7 days ago. There was no injury mechanism. The pain is present in the right thigh, right hip and right knee (posterior knee, lateral and medial thigh). The quality of the pain is described as aching. The pain has been Constant since onset. Pertinent negatives include no inability to bear weight, loss of motion, loss of sensation, muscle weakness, numbness or tingling. He reports no foreign bodies present. The symptoms are aggravated by movement and weight bearing. He has tried nothing for the symptoms. The treatment provided mild relief.     ROS As per HPI.       Objective:    BP 109/65   Pulse 99   Temp (!) 97.4 F (36.3 C) (Temporal)   Ht 5\' 6"  (1.676 m)   Wt (!) 300 lb 12.8 oz (136.4 kg)   SpO2 94%   BMI 48.55 kg/m    Physical Exam Vitals and nursing note reviewed.  Constitutional:      General: He is not in acute distress.    Appearance: He is obese. He is not ill-appearing, toxic-appearing or diaphoretic.  Pulmonary:     Effort: Pulmonary effort is normal. No respiratory distress.  Musculoskeletal:     Lumbar back: Normal. Negative right straight leg raise test.     Right hip: No tenderness or bony tenderness. Normal range of motion.     Right upper leg: No swelling, edema, tenderness or bony tenderness.     Right knee: No swelling, effusion, erythema or ecchymosis.     Right lower leg: No swelling or tenderness. No edema.     Left lower leg: No swelling. No edema.     Right ankle: No swelling.  Skin:    General: Skin is warm and dry.  Neurological:     General: No focal deficit present.     Mental Status: He is alert and oriented to person, place, and time.   Psychiatric:        Mood and Affect: Mood normal.        Behavior: Behavior normal.     No results found for any visits on 03/09/23.      Assessment & Plan:   Ciaran was seen today for leg pain.  Diagnoses and all orders for this visit:  Right leg pain Medial and lateral thigh, posterior knee, hip. No erythema, tenderness, or swelling present. No injury. Mobic as below. Do not take other NSAIDs with mobic. RICE therapy. Return to office for new or worsening symptoms, or if symptoms persist.  -     meloxicam (MOBIC) 15 MG tablet; Take 1 tablet (15 mg total) by mouth daily.  The patient indicates understanding of these issues and agrees with the plan.   Gabriel Earing, FNP

## 2023-03-13 ENCOUNTER — Encounter: Payer: Self-pay | Admitting: Family Medicine

## 2023-03-13 ENCOUNTER — Ambulatory Visit (INDEPENDENT_AMBULATORY_CARE_PROVIDER_SITE_OTHER): Payer: Self-pay | Admitting: Family Medicine

## 2023-03-13 ENCOUNTER — Ambulatory Visit (INDEPENDENT_AMBULATORY_CARE_PROVIDER_SITE_OTHER): Payer: Self-pay

## 2023-03-13 VITALS — BP 150/69 | HR 67 | Temp 98.2°F | Ht 66.0 in | Wt 306.0 lb

## 2023-03-13 DIAGNOSIS — Z114 Encounter for screening for human immunodeficiency virus [HIV]: Secondary | ICD-10-CM

## 2023-03-13 DIAGNOSIS — I499 Cardiac arrhythmia, unspecified: Secondary | ICD-10-CM

## 2023-03-13 DIAGNOSIS — E1169 Type 2 diabetes mellitus with other specified complication: Secondary | ICD-10-CM

## 2023-03-13 DIAGNOSIS — Z125 Encounter for screening for malignant neoplasm of prostate: Secondary | ICD-10-CM

## 2023-03-13 DIAGNOSIS — K7469 Other cirrhosis of liver: Secondary | ICD-10-CM

## 2023-03-13 DIAGNOSIS — E119 Type 2 diabetes mellitus without complications: Secondary | ICD-10-CM | POA: Insufficient documentation

## 2023-03-13 DIAGNOSIS — M25551 Pain in right hip: Secondary | ICD-10-CM

## 2023-03-13 DIAGNOSIS — E785 Hyperlipidemia, unspecified: Secondary | ICD-10-CM

## 2023-03-13 DIAGNOSIS — Z7984 Long term (current) use of oral hypoglycemic drugs: Secondary | ICD-10-CM

## 2023-03-13 DIAGNOSIS — I152 Hypertension secondary to endocrine disorders: Secondary | ICD-10-CM

## 2023-03-13 DIAGNOSIS — E1159 Type 2 diabetes mellitus with other circulatory complications: Secondary | ICD-10-CM

## 2023-03-13 LAB — CMP14+EGFR
BUN/Creatinine Ratio: 18 (ref 10–24)
BUN: 15 mg/dL (ref 8–27)
CO2: 24 mmol/L (ref 20–29)
Calcium: 9 mg/dL (ref 8.6–10.2)
Creatinine, Ser: 0.85 mg/dL (ref 0.76–1.27)
Globulin, Total: 2.2 g/dL (ref 1.5–4.5)
Total Protein: 6.4 g/dL (ref 6.0–8.5)

## 2023-03-13 LAB — BAYER DCA HB A1C WAIVED: HB A1C (BAYER DCA - WAIVED): 6.3 % — ABNORMAL HIGH (ref 4.8–5.6)

## 2023-03-13 LAB — MAGNESIUM

## 2023-03-13 LAB — LIPID PANEL
Cholesterol, Total: 105 mg/dL (ref 100–199)
Triglycerides: 74 mg/dL (ref 0–149)
VLDL Cholesterol Cal: 15 mg/dL (ref 5–40)

## 2023-03-13 MED ORDER — METHYLPREDNISOLONE ACETATE 80 MG/ML IJ SUSP
80.0000 mg | Freq: Once | INTRAMUSCULAR | Status: AC
Start: 2023-03-13 — End: 2023-03-13
  Administered 2023-03-13: 80 mg via INTRAMUSCULAR

## 2023-03-13 NOTE — Progress Notes (Signed)
Subjective: CC:Dm PCP: Thomas Ip, DO ZOX:WRUEAV W Thomas Ball is a 62 y.o. male presenting to clinic today for:  1. Type 2 Diabetes with hypertension, hyperlipidemia (with cirrhosis of the liver):  He reports compliance with medications.  No reports of hypoglycemic episodes.  Last eye exam: needs Last foot exam: needs Last A1c:  Lab Results  Component Value Date   HGBA1C 6.5 (H) 10/06/2022   Nephropathy screen indicated?: needs Last flu, zoster and/or pneumovax:  Immunization History  Administered Date(s) Administered   Influenza Split 11/22/2012   Influenza,inj,Quad PF,6+ Mos 07/02/2015, 09/09/2018, 06/28/2021, 10/06/2022   Pneumococcal Polysaccharide-23 11/22/2012   Rabies, IM 02/05/2022, 02/08/2022, 02/12/2022, 02/19/2022   Tdap 06/11/2012, 02/05/2022    ROS: Denies dizziness, LOC, polyuria, polydipsia, unintended weight loss/gain, foot ulcerations, numbness or tingling in extremities, shortness of breath or chest pain.  2.  Right leg pain His main concern today is right leg pain.  He notes that it started lower in the leg but seems to be affecting the entire leg.  Denies any burning sensation or weakness.  Pain is exacerbated by ambulation and standing.  It is not alleviated by OTC analgesics or topicals  ROS: Per HPI  No Known Allergies Past Medical History:  Diagnosis Date   GERD (gastroesophageal reflux disease)    HCV (hepatitis C virus) DR. ZACKS   AUG 2013 VIRAL LOAD UNDETECTABLE   History of cardiac catheterization    a. 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   Sleep apnea    SVT (supraventricular tachycardia)     Current Outpatient Medications:    albuterol (PROVENTIL) (2.5 MG/3ML) 0.083% nebulizer solution, Take 3 mLs (2.5 mg total) by nebulization every 6 (six) hours as needed for wheezing or shortness of breath.,  Disp: 150 mL, Rfl: 1   albuterol (VENTOLIN HFA) 108 (90 Base) MCG/ACT inhaler, Inhale 2 puffs into the lungs every 6 (six) hours as needed for wheezing or shortness of breath., Disp: 8 g, Rfl: 2   atorvastatin (LIPITOR) 40 MG tablet, Take 1 tablet (40 mg total) by mouth daily at 6 PM., Disp: 30 tablet, Rfl: 0   doxazosin (CARDURA) 1 MG tablet, Take 1 tablet (1 mg total) by mouth daily., Disp: 90 tablet, Rfl: 1   flecainide (TAMBOCOR) 50 MG tablet, Take 1 tablet (50 mg total) by mouth 2 (two) times daily., Disp: 180 tablet, Rfl: 3   ibuprofen (ADVIL) 200 MG tablet, Take by mouth.  Take 200 mg by mouth every 8 (eight) hours as needed., Disp: , Rfl:    loratadine (CLARITIN) 10 MG tablet, Take 1 tablet (10 mg total) by mouth daily., Disp: 30 tablet, Rfl: 11   meloxicam (MOBIC) 15 MG tablet, Take 1 tablet (15 mg total) by mouth daily., Disp: 30 tablet, Rfl: 0   metFORMIN (GLUCOPHAGE) 500 MG tablet, TAKE ONE TABLET BY MOUTH TWICE DAILY, Disp: 180 tablet, Rfl: 0   omeprazole (PRILOSEC) 40 MG capsule, TAKE ONE (1) CAPSULE EACH DAY, Disp: 90 capsule, Rfl: 1   Tiotropium Bromide-Olodaterol (STIOLTO RESPIMAT) 2.5-2.5 MCG/ACT AERS, Inhale 2 puffs into the lungs daily., Disp: 1 each, Rfl: 5   Tiotropium Bromide-Olodaterol (STIOLTO RESPIMAT) 2.5-2.5 MCG/ACT AERS, Inhale 2 puffs into the lungs daily., Disp: 4 g, Rfl: 0 Social History   Socioeconomic History   Marital status: Widowed    Spouse  name: Not on file   Number of children: Not on file   Years of education: Not on file   Highest education level: Not on file  Occupational History   Occupation: concrete work    Associate Professor: SELF-EMPLOYED  Tobacco Use   Smoking status: Every Day    Packs/day: 2.00    Years: 45.00    Additional pack years: 0.00    Total pack years: 90.00    Types: Cigarettes   Smokeless tobacco: Never   Tobacco comments:    smokes 1-1.5 packs per day  Vaping Use   Vaping Use: Never used  Substance and Sexual Activity   Alcohol  use: No   Drug use: No    Comment: in the 1980s, smoke crack. None since Dec 2011.    Sexual activity: Not Currently  Other Topics Concern   Not on file  Social History Narrative   Not on file   Social Determinants of Health   Financial Resource Strain: Not on file  Food Insecurity: Not on file  Transportation Needs: Not on file  Physical Activity: Not on file  Stress: Not on file  Social Connections: Not on file  Intimate Partner Violence: Not on file   Family History  Problem Relation Age of Onset   Emphysema Mother    COPD Mother    Early death Mother    Hyperlipidemia Father    Stroke Father    Diabetes Father    High Cholesterol Father    Colon cancer Neg Hx     Objective: Office vital signs reviewed. BP (!) 150/69   Pulse 67   Temp 98.2 F (36.8 C)   Ht 5\' 6"  (1.676 m)   Wt (!) 306 lb (138.8 kg)   SpO2 95%   BMI 49.39 kg/m   Physical Examination:  General: Awake, alert, super morbidly obese, No acute distress HEENT: sclera white, mmm Cardio: regular rate and rhythm, S1S2 heard, no murmurs appreciated Pulm: clear to auscultation bilaterally, no wheezes, rhonchi or rales; normal work of breathing on room air MSK: Antalgic gait and station.  He has difficulty getting up from a seated position due to pain and stiffness.  Negative straight leg raise Neuro: See diabetic foot Diabetic Foot Exam - Simple   Simple Foot Form Diabetic Foot exam was performed with the following findings: Yes 03/13/2023  5:06 PM  Visual Inspection No deformities, no ulcerations, no other skin breakdown bilaterally: Yes See comments: Yes Sensation Testing Intact to touch and monofilament testing bilaterally: Yes Pulse Check Posterior Tibialis and Dorsalis pulse intact bilaterally: Yes Comments He does have some scaliness of the skin but no breakdown/ulceration.  Toenails are onychomycotic      Assessment/ Plan: 62 y.o. male   Controlled type 2 diabetes mellitus with other  specified complication, without long-term current use of insulin (HCC) - Plan: Bayer DCA Hb A1c Waived, CMP14+EGFR, Microalbumin / creatinine urine ratio  Long term (current) use of oral hypoglycemic drugs  Hyperlipidemia associated with type 2 diabetes mellitus (HCC) - Plan: CMP14+EGFR, Lipid Panel  Hypertension associated with diabetes (HCC) - Plan: CMP14+EGFR  Other cirrhosis of liver (HCC) - Plan: CMP14+EGFR  Irregular heart beat - Plan: Magnesium  Screening for malignant neoplasm of prostate - Plan: PSA  Screening for HIV (human immunodeficiency virus) - Plan: HIV antibody (with reflex)  Right hip pain - Plan: methylPREDNISolone acetate (DEPO-MEDROL) injection 80 mg, DG Hip Unilat W OR W/O Pelvis 2-3 Views Right  Sugar remains under good control.  No changes.  Check urine microalbumin, renal function  Continue statin for now.  Given history of cirrhosis of liver, we may need to be careful with this going forward.  He is under the care of gastroenterology for surveillance of this as well  Blood pressure is borderline though he was in quite a bit of pain.  He was given a Depo-Medrol injection today in efforts to alleviate some of his pain.  Plain films obtained.  Plan for referral to orthopedics or physical therapy pending his response and plain film results  Electrolytes, screening PSA and HIV also collected today.  No orders of the defined types were placed in this encounter.  No orders of the defined types were placed in this encounter.    Thomas Ip, DO [facility] [phone]

## 2023-03-13 NOTE — Patient Instructions (Signed)

## 2023-03-14 ENCOUNTER — Telehealth: Payer: Self-pay | Admitting: Family Medicine

## 2023-03-14 ENCOUNTER — Other Ambulatory Visit: Payer: Self-pay | Admitting: Family Medicine

## 2023-03-14 LAB — CMP14+EGFR
ALT: 25 IU/L (ref 0–44)
AST: 19 IU/L (ref 0–40)
Albumin: 4.2 g/dL (ref 3.8–4.9)
Alkaline Phosphatase: 26 IU/L — ABNORMAL LOW (ref 44–121)
Bilirubin Total: 0.9 mg/dL (ref 0.0–1.2)
Chloride: 100 mmol/L (ref 96–106)
Glucose: 126 mg/dL — ABNORMAL HIGH (ref 70–99)
Potassium: 4.5 mmol/L (ref 3.5–5.2)
Sodium: 136 mmol/L (ref 134–144)
eGFR: 99 mL/min/{1.73_m2} (ref >59)

## 2023-03-14 LAB — MICROALBUMIN / CREATININE URINE RATIO
Creatinine, Urine: 91.5 mg/dL
Microalb/Creat Ratio: 4 mg/g creat (ref 0–29)
Microalbumin, Urine: 3.5 ug/mL

## 2023-03-14 LAB — LIPID PANEL
Chol/HDL Ratio: 2.7 ratio (ref 0.0–5.0)
HDL: 39 mg/dL — ABNORMAL LOW (ref >39)
LDL Chol Calc (NIH): 51 mg/dL (ref 0–99)

## 2023-03-14 LAB — PSA: Prostate Specific Ag, Serum: 1 ng/mL (ref 0.0–4.0)

## 2023-03-14 LAB — HIV ANTIBODY (ROUTINE TESTING W REFLEX): HIV Screen 4th Generation wRfx: NONREACTIVE

## 2023-03-14 NOTE — Telephone Encounter (Signed)
Left message for patient to call back to schedule diabetic retinal exam with camera we have here in office now.

## 2023-03-15 ENCOUNTER — Ambulatory Visit: Payer: No Typology Code available for payment source

## 2023-03-16 ENCOUNTER — Other Ambulatory Visit: Payer: Self-pay | Admitting: Family Medicine

## 2023-03-16 ENCOUNTER — Telehealth: Payer: Self-pay | Admitting: Family Medicine

## 2023-03-16 ENCOUNTER — Other Ambulatory Visit: Payer: Self-pay | Admitting: *Deleted

## 2023-03-16 DIAGNOSIS — M25551 Pain in right hip: Secondary | ICD-10-CM

## 2023-03-16 MED ORDER — LIDOCAINE 5 % EX PTCH
1.0000 | MEDICATED_PATCH | CUTANEOUS | 99 refills | Status: DC
Start: 2023-03-16 — End: 2023-03-16

## 2023-03-16 MED ORDER — LIDOCAINE 5 % EX PTCH
1.0000 | MEDICATED_PATCH | CUTANEOUS | 99 refills | Status: DC
Start: 2023-03-16 — End: 2023-03-22

## 2023-03-16 NOTE — Telephone Encounter (Signed)
Patient says he has tried 5% patches before and they were effective. He says he has the 4% patches and they are not helping.  He would like you to send a script to [facility] and see how much it is going to cost.  He also asked about the official results of his hip x-ray

## 2023-03-16 NOTE — Telephone Encounter (Signed)
All sent

## 2023-03-16 NOTE — Telephone Encounter (Signed)
Pt wants to know if Dr Nadine Counts can prescribe him the Lidocaine Patches for the pain he's having right now.

## 2023-03-16 NOTE — Telephone Encounter (Signed)
Pt aware.

## 2023-03-16 NOTE — Telephone Encounter (Signed)
Insurance won't cover but the prescription ones and the SalonPas are only 1% different in strength.  Definitely not worth him paying hundreds of dollars for the 5% when OTC is 4% and should work just as well.

## 2023-03-19 ENCOUNTER — Other Ambulatory Visit: Payer: Self-pay | Admitting: Family Medicine

## 2023-03-19 ENCOUNTER — Telehealth: Payer: Self-pay | Admitting: Family Medicine

## 2023-03-19 DIAGNOSIS — M25551 Pain in right hip: Secondary | ICD-10-CM

## 2023-03-19 NOTE — Telephone Encounter (Signed)
Patient aware of results - see result note in chart

## 2023-03-22 ENCOUNTER — Encounter: Payer: Self-pay | Admitting: Internal Medicine

## 2023-03-22 ENCOUNTER — Ambulatory Visit: Payer: Self-pay | Attending: Internal Medicine | Admitting: Internal Medicine

## 2023-03-22 VITALS — BP 104/74 | HR 41 | Ht 66.0 in | Wt 299.0 lb

## 2023-03-22 DIAGNOSIS — I491 Atrial premature depolarization: Secondary | ICD-10-CM

## 2023-03-22 NOTE — Patient Instructions (Signed)
Medication Instructions:  Your physician recommends that you continue on your current medications as directed. Please refer to the Current Medication list given to you today.  *If you need a refill on your cardiac medications before your next appointment, please call your pharmacy*   Follow-Up: At [facility], you and your health needs are our priority.  As part of our continuing mission to provide you with exceptional heart care, we have created designated Provider Care Teams.  These Care Teams include your primary Cardiologist (physician) and Advanced Practice Providers (APPs -  Physician Assistants and Nurse Practitioners) who all work together to provide you with the care you need, when you need it.  We recommend signing up for the patient portal called "MyChart".  Sign up information is provided on this After Visit Summary.  MyChart is used to connect with patients for Virtual Visits (Telemedicine).  Patients are able to view lab/test results, encounter notes, upcoming appointments, etc.  Non-urgent messages can be sent to your provider as well.   To learn more about what you can do with MyChart, go to [email or web address].    Your next appointment:   1 year(s)  Provider:   Paula Ross, MD     

## 2023-03-22 NOTE — Progress Notes (Signed)
Cardiology Office Note   Date:  03/22/2023   ID:  Thomas Ball, DOB 1961/05/11, MRN [id number]  PCP:  Raliegh Ip, DO  Cardiologist:   Dietrich Pates, MD   Follow up of palpitations     History of Present Illness: Thomas Ball is a 61 y.o. male with a history of palpitations, tachy/brady syndrome  he is s/p SVT ablation in 2015    He also has a hx of normal coronary arteries (LHC 2020), HL, HTN, cirrhosis, Hep C, GERD, OSA (using CPAP)  I saw him in early 2023   He was last seen by Rosette Reveal in Feb 2024    Since seen the pt denies CP   Breathing OK   He is not that active     Dnies palpitations  Pulse will be read out as low   ? If accurate due to skips   He says he is occasionally off balance but denies dizziness  Weight down a little   Wants to eat better    Current Meds  Medication Sig   albuterol (PROVENTIL) (2.5 MG/3ML) 0.083% nebulizer solution Take 3 mLs (2.5 mg total) by nebulization every 6 (six) hours as needed for wheezing or shortness of breath.   albuterol (VENTOLIN HFA) 108 (90 Base) MCG/ACT inhaler Inhale 2 puffs into the lungs every 6 (six) hours as needed for wheezing or shortness of breath.   atorvastatin (LIPITOR) 40 MG tablet TAKE 1 TABLET DAILY AT 6PM NEEDS TO BE SEEN   doxazosin (CARDURA) 1 MG tablet Take 1 tablet (1 mg total) by mouth daily.   flecainide (TAMBOCOR) 50 MG tablet Take 1 tablet (50 mg total) by mouth 2 (two) times daily.   metFORMIN (GLUCOPHAGE) 500 MG tablet TAKE ONE TABLET BY MOUTH TWICE DAILY   omeprazole (PRILOSEC) 40 MG capsule TAKE ONE (1) CAPSULE EACH DAY   Tiotropium Bromide-Olodaterol (STIOLTO RESPIMAT) 2.5-2.5 MCG/ACT AERS Inhale 2 puffs into the lungs daily.     Allergies:   Patient has no known allergies.   Past Medical History:  Diagnosis Date   GERD (gastroesophageal reflux disease)    HCV (hepatitis C virus) DR. ZACKS   AUG 2013 VIRAL LOAD UNDETECTABLE   History of cardiac catheterization    a. 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   CHOLECYSTECTOMY     COLONOSCOPY  10/04/2012   SLF:ONE/8 SIMPLE ADENOMA, TICS, SML IH   ESOPHAGOGASTRODUODENOSCOPY N/A 09/04/2014   Procedure: ESOPHAGOGASTRODUODENOSCOPY (EGD);  Surgeon: West Bali, MD;  [facility];  Service: Endoscopy;  Laterality: N/A;  145   GALLBLADDER SURGERY     JAW BROKEN     LIPOMA REMOVALS     SEVERAL   RIGHT/LEFT HEART CATH AND CORONARY ANGIOGRAPHY N/A 10/08/2018   Procedure: RIGHT/LEFT HEART CATH AND CORONARY ANGIOGRAPHY;  Surgeon: Swaziland, Peter M, MD;  Location: [facility] INVASIVE CV LAB;  Service: Cardiovascular;  Laterality: N/A;   SUPRAVENTRICULAR TACHYCARDIA ABLATION N/A 06/22/2014   Procedure: SUPRAVENTRICULAR TACHYCARDIA ABLATION;  Surgeon: Marinus Maw, MD;  Location: [facility] CATH LAB;  Service: Cardiovascular;  Laterality: N/A;     Social History:  The patient  reports that he has been smoking cigarettes. He has a 90.00 pack-year smoking history. He has never used smokeless tobacco. He reports that he does not drink alcohol and does not use drugs.   Family History:  The patient's family history includes COPD in his mother; Diabetes in his father; Early death in his mother; Emphysema in his mother; High Cholesterol in his father; Hyperlipidemia in his father; Stroke in his father.    ROS:  Please see the history of present illness. All other systems are reviewed and  Negative to the above problem except as noted.    PHYSICAL EXAM: VS:  BP 104/74   Pulse (!) 41   Ht 5\' 6"  (1.676 m)   Wt 299 lb (135.6 kg)   SpO2 95%   BMI 48.26 kg/m    Pulse while listening to him is 56 to 65   bpm   GEN:  Morbidly obese 62 yo in NAD  Examined  in chair  HEENT: normal  Neck: no JVD, carotid bruits Cardiac: RRR; no murmur  Tr LE  edema  Respiratory:  clear to auscultation bilaterally,  GI: soft, nontender  No hepatomegaly  MS: no deformity Moving all extremities   Skin: warm and dry, no rash Neuro:  Strength and sensation are intact Psych: euthymic mood, full affect   EKG:  EKG is not ordered today.   Lipid Panel    Component Value Date/Time   CHOL 105 03/13/2023 0817   TRIG 74 03/13/2023 0817   HDL 39 (L) 03/13/2023 0817   CHOLHDL 2.7 03/13/2023 0817   CHOLHDL 5.4 09/06/2017 1321   VLDL 24 09/06/2017 1321   LDLCALC 51 03/13/2023 0817      Wt Readings from Last 3 Encounters:  03/22/23 299 lb (135.6 kg)  03/13/23 (!) 306 lb (138.8 kg)  03/09/23 (!) 300 lb 12.8 oz (136.4 kg)      ASSESSMENT AND PLAN:  1  Atrial ectopy   Pt with hx of PACs  Monitor in 2023 14% today     He is on flecanide   denies palpitatiosn   I would follow   2  HL   LDL 51  HDL 39  Excellent   Given risk factors keep on statin  3  DM   A1C 6.3   Continue current meds   4   Metabolic   Discussed limiting processed foods   Whole natural food best   He does not cook since wife died a couple years ago  Would like to hve meals made.       Current medicines are reviewed at length with the patient today.  The patient does not have concerns regarding medicines.  Signed, Dietrich Pates, MD  03/22/2023 10:23 AM    [facility] [address] [place], [place], [place]  [address] Phone: [phone]; Fax: [phone]

## 2023-03-23 ENCOUNTER — Telehealth: Payer: Self-pay | Admitting: Family Medicine

## 2023-03-23 NOTE — Telephone Encounter (Signed)
Pt says he had a Rx for Nystatin at one point that he was taking PRN. Wants to know what he can take OTC that would be similar to Nystatin.

## 2023-03-23 NOTE — Telephone Encounter (Signed)
Lotrimin

## 2023-03-23 NOTE — Telephone Encounter (Signed)
Contacted patient. Notified patient. Patient verbalized understanding 

## 2023-04-03 ENCOUNTER — Telehealth: Payer: Self-pay | Admitting: Internal Medicine

## 2023-04-03 DIAGNOSIS — R42 Dizziness and giddiness: Secondary | ICD-10-CM

## 2023-04-03 DIAGNOSIS — R001 Bradycardia, unspecified: Secondary | ICD-10-CM

## 2023-04-03 NOTE — Telephone Encounter (Signed)
STAT if patient feels like he/she is going to faint   Are you dizzy now? Very slightly   Do you feel faint or have you passed out? No  Do you have any other symptoms? Off balance   Have you checked your HR and BP (record if available)? HR- 50's

## 2023-04-03 NOTE — Telephone Encounter (Signed)
I spoke with patient.  He reports for the last couple of days he hasn't felt well.  Feels a little better today. Doesn't feel like he will pass out but feels a little off balance.  Heart rate running mostly in 50's per pulse ox. Range is 30's -80's  Seen earlier this year when heart rate was low and it was determined his pulse ox counted one of his PAC's as a beat and registered it as a low heart rate. Does not know BP. Patient will feel off balance when reaching up to turn off the ceiling fan.   Drinks coke zero during the day. Orthostatic precautions reviewed with patient. Patient advised to stay hydrated with water.  Patient reports he thinks he has bronchitis.  Has not seen provider for this.  He has nasal drainage, is coughing and has mucous in his chest.  No fever.  Oxygen sat 91-96%. I told patient off balance feeling may be coming from his current illness.  I advised him to contact PCP for evaluation of symptoms.

## 2023-04-03 NOTE — Telephone Encounter (Signed)
Set patient up for 72 hour monitor to reevaluate rhythm Stay hydrated   Stay cool

## 2023-04-04 ENCOUNTER — Ambulatory Visit: Payer: Self-pay | Attending: Internal Medicine

## 2023-04-04 DIAGNOSIS — R001 Bradycardia, unspecified: Secondary | ICD-10-CM

## 2023-04-04 DIAGNOSIS — R42 Dizziness and giddiness: Secondary | ICD-10-CM

## 2023-04-04 NOTE — Progress Notes (Unsigned)
Enrolled patient for a 3 day Zio XT monitor to be mailed to patients home  

## 2023-04-04 NOTE — Telephone Encounter (Signed)
Pt advised and he is calling his PCP to be seen for his cold symptoms.. he agrees to the [facility] and will let us now if anything changes.

## 2023-04-05 ENCOUNTER — Ambulatory Visit (INDEPENDENT_AMBULATORY_CARE_PROVIDER_SITE_OTHER): Payer: Self-pay | Admitting: Nurse Practitioner

## 2023-04-05 ENCOUNTER — Encounter: Payer: Self-pay | Admitting: Nurse Practitioner

## 2023-04-05 ENCOUNTER — Ambulatory Visit (INDEPENDENT_AMBULATORY_CARE_PROVIDER_SITE_OTHER): Payer: Self-pay | Admitting: Orthopaedic Surgery

## 2023-04-05 VITALS — BP 118/64 | HR 73 | Temp 97.9°F | Ht 66.0 in | Wt 299.2 lb

## 2023-04-05 DIAGNOSIS — K047 Periapical abscess without sinus: Secondary | ICD-10-CM

## 2023-04-05 DIAGNOSIS — M545 Low back pain, unspecified: Secondary | ICD-10-CM

## 2023-04-05 DIAGNOSIS — J069 Acute upper respiratory infection, unspecified: Secondary | ICD-10-CM

## 2023-04-05 MED ORDER — FLUTICASONE PROPIONATE 50 MCG/ACT NA SUSP
2.0000 | Freq: Every day | NASAL | 0 refills | Status: DC
Start: 2023-04-05 — End: 2024-05-07

## 2023-04-05 MED ORDER — METHYLPREDNISOLONE ACETATE 40 MG/ML IJ SUSP
40.0000 mg | Freq: Once | INTRAMUSCULAR | Status: AC
Start: 2023-04-05 — End: 2023-04-05
  Administered 2023-04-05: 40 mg via INTRAMUSCULAR

## 2023-04-05 MED ORDER — AMOXICILLIN-POT CLAVULANATE 875-125 MG PO TABS
1.0000 | ORAL_TABLET | Freq: Two times a day (BID) | ORAL | 0 refills | Status: AC
Start: 2023-04-05 — End: 2023-04-12

## 2023-04-05 NOTE — Progress Notes (Signed)
Acute Office Visit  Subjective:     Patient ID: Thomas Ball, male    DOB: 1960-12-18, 62 y.o.   MRN: [id number]  Chief Complaint  Patient presents with   Nasal Congestion    Has been going on for couple weeks. Has taken mucinex and Claritin, helps some   Cough    HPI Thomas Ball is a 62 y.o. male who complains of fatigue, congestion and non productive cough for 2-3 weeks. He denies a history of anorexia, chest pain, fevers, nausea, and vomiting and has a history of COPD. Patient admits to smoke cigarettes. Smokes 1/2 pack daily currently on lozenges to help quit smoking was able to from 2 pack daily to 1/2 pack in 1-year.  Peritonsillar abscess/New concerns Sever pain 8/10 stabbing in the right lower jaw from 3-days ago. Reports swelling, difficulty chewing on the right side. Denies fever  Active Ambulatory Problems    Diagnosis Date Noted   Pulmonary nodule 02/14/2012   GERD (gastroesophageal reflux disease) 07/18/2012   Screening for colorectal cancer 07/18/2012   Cirrhosis of liver (HCC) 11/05/2013   Intermittent palpitations 04/21/2014   Bradycardia 04/21/2014   Morbid obesity (HCC) 04/21/2014   SVT (supraventricular tachycardia) (HCC)    Tobacco abuse 07/02/2015   EIC (epidermal inclusion cyst) 07/23/2015   BPH (benign prostatic hyperplasia) 10/07/2015   OSA (obstructive sleep apnea) 03/04/2018   Hypertension associated with diabetes (HCC) 04/26/2018   Panic attacks 12/07/2020   Chest congestion 12/07/2020   Hyperlipidemia associated with type 2 diabetes mellitus (HCC) 04/29/2021   Long term (current) use of oral hypoglycemic drugs 03/13/2023   Diabetes mellitus type II, controlled (HCC) 03/13/2023   Low back pain 04/05/2023   URI with cough and congestion 04/05/2023   Tooth abscess 04/05/2023   Resolved Ambulatory Problems    Diagnosis Date Noted   Hepatitis C, chronic (HCC) 12/12/2010   Hepatitis C 01/21/2012   Chest pain 04/20/2014   Hypotension  04/21/2014   Cough 09/17/2015   SOB (shortness of breath) 09/15/2018   Dyspnea    Pure hypercholesterolemia 01/12/2020   Headache in front of head 12/07/2020   Uncontrolled type 2 diabetes mellitus with hyperglycemia (HCC) 04/29/2021   Past Medical History:  Diagnosis Date   HCV (hepatitis C virus) DR. ZACKS   History of cardiac catheterization    Hypercholesteremia    Hypertension    Pancreatitis, gallstone    Skin cancer 1992   Sleep apnea      Past Medical History:  Diagnosis Date   GERD (gastroesophageal reflux disease)    HCV (hepatitis C virus) DR. ZACKS   AUG 2013 VIRAL LOAD UNDETECTABLE   History of cardiac catheterization    a. 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   Sleep apnea    SVT (supraventricular tachycardia)     Review of Systems  Constitutional:  Negative for chills and fever.  HENT:  Positive for congestion and ear pain. Negative for sore throat.        Tooth pain right lower jaw  Respiratory:  Positive for cough and wheezing. Negative for sputum production and shortness of breath.   Cardiovascular:  Negative for chest pain and leg swelling.  Gastrointestinal:  Negative for constipation, diarrhea and heartburn.  Musculoskeletal:  Negative for falls, joint pain and myalgias.  Skin:  Positive for itching. Negative for rash.  Neurological:  Negative for dizziness and weakness.   Negative unless indicated in HPI    Objective:    BP 118/64   Pulse 73   Temp 97.9 F (36.6 C) (Temporal)   Ht 5\' 6"  (1.676 m)   Wt 299 lb 3.2 oz (135.7 kg)   SpO2 94%   BMI 48.29 kg/m  BP Readings from Last 3 Encounters:  04/05/23 118/64  03/22/23 104/74  03/13/23 (!) 150/69   Wt Readings from Last 3 Encounters:  04/05/23 299 lb 3.2 oz (135.7 kg)  03/22/23 299 lb (135.6 kg)  03/13/23 (!) 306 lb (138.8 kg)      Physical  Exam Vitals and nursing note reviewed.  Constitutional:      Appearance: Normal appearance. He is obese.  HENT:     Head: Normocephalic and atraumatic.     Mouth/Throat:     Lips: Pink.     Dentition: Dental tenderness and dental abscesses present.  Pulmonary:     Effort: Pulmonary effort is normal.     Breath sounds: Examination of the right-lower field reveals wheezing. Examination of the left-lower field reveals wheezing. Wheezing present.     Comments: Faint expiratory Abdominal:     General: There is distension.  Musculoskeletal:        General: Normal range of motion.  Skin:    General: Skin is warm and dry.     Findings: Rash present.  Neurological:     General: No focal deficit present.     Mental Status: He is alert and oriented to person, place, and time. Mental status is at baseline.  Psychiatric:        Mood and Affect: Mood normal.        Behavior: Behavior normal.        Thought Content: Thought content normal.        Judgment: Judgment normal.       He appears well, vital signs are as noted. Ears normal.  Throat and pharynx normal.  Neck supple. No adenopathy in the neck. Nose is congested. Sinuses non tender. The chest is clear, without wheezes or rales. No results found for any visits on 04/05/23.      Assessment & Plan:  URI with cough and congestion -     methylPREDNISolone Acetate -     Fluticasone Propionate; Place 2 sprays into both nostrils daily.  Dispense: 16 g; Refill: 0  Tooth abscess -     Amoxicillin-Pot Clavulanate; Take 1 tablet by mouth 2 (two) times daily for 7 days.  Dispense: 14 tablet; Refill: 0    ASSESSMENT:  Thomas Ball is 62 yrs old obese male Peritonsillar abscess viral upper respiratory illness and bronchitis  PLAN: viral upper respiratory illness and bronchitis Methylprednisolone 40 mg IM injection Flonase 1 spray in each nostril Tooth abscess Augmentin 875 BID for 7-days #14 dispense Brush/flush BID Continue OTC  Mucinex for cough Symptomatic therapy suggested: push fluids, rest, apply heat to sinuses prn Lack of antibiotic effectiveness discussed with him.  Call or return to clinic prn if these symptoms worsen or fail to improve as anticipated.  Return if symptoms worsen or fail to improve.  [name], DNP [facility] [address] [place], [place] [address] [phone]

## 2023-04-05 NOTE — Progress Notes (Signed)
Office Visit Note   Patient: Thomas Ball           Date of Birth: 08-24-1961           MRN: [id number] Visit Date: 04/05/2023              Requested by: Raliegh Ip, DO [address] [place],  [place] [address] PCP: Raliegh Ip, DO   Assessment & Plan: Visit Diagnoses:  1. Right-sided low back pain without sciatica, unspecified chronicity     Plan: Patient likely has some lumbar disc protrusion with intermittent symptoms currently not symptomatic.  He will continue to work on a walking program try to work on losing some weight to unload his lumbar spine.  We reviewed his x-rays of his hip which only shows some mild arthritis.  If he develops radicular weakness can heel toe walk he can return.  Pathophysiology discussed.  At this point he is not having any symptoms and does not need any imaging studies.  Follow-Up Instructions: No follow-ups on file.   Orders:  No orders of the defined types were placed in this encounter.  No orders of the defined types were placed in this encounter.     Procedures: No procedures performed   Clinical Data: No additional findings.   Subjective: Chief Complaint  Patient presents with   Right Leg - Pain    HPI 62 year old male with BMI of 48 seen with previous back and hip pain more on the right than left side radiated into his buttocks into his thigh sometimes he had some groin pain.  Occasional right knee pain most time it did not radiate to his foot.  He states currently it is not bothering him.  Patient has 90-pack-year smoking history history of alcohol continued use. Review of Systems all systems noncontributory.   Objective: Vital Signs: There were no vitals taken for this visit.  Physical Exam Constitutional:      Appearance: He is well-developed.  HENT:     Head: Normocephalic and atraumatic.     Right Ear: External ear normal.     Left Ear: External ear normal.  Eyes:     Pupils: Pupils are equal,  round, and reactive to light.  Neck:     Thyroid: No thyromegaly.     Trachea: No tracheal deviation.  Cardiovascular:     Rate and Rhythm: Normal rate.  Pulmonary:     Effort: Pulmonary effort is normal.     Breath sounds: No wheezing.  Abdominal:     General: Bowel sounds are normal.     Palpations: Abdomen is soft.  Musculoskeletal:     Cervical back: Neck supple.  Skin:    General: Skin is warm and dry.     Capillary Refill: Capillary refill takes less than 2 seconds.  Neurological:     Mental Status: He is alert and oriented to person, place, and time.  Psychiatric:        Behavior: Behavior normal.        Thought Content: Thought content normal.        Judgment: Judgment normal.     Ortho Exam negative logroll hips right and left knees reach full extension minimal trochanteric bursal tenderness.  He is able to heel and toe walk no isolated motor weakness no atrophy.  Specialty Comments:  No specialty comments available.  Imaging: No results found.   PMFS History: Patient Active Problem List   Diagnosis Date Noted  Low back pain 04/05/2023   Long term (current) use of oral hypoglycemic drugs 03/13/2023   Diabetes mellitus type II, controlled (HCC) 03/13/2023   Hyperlipidemia associated with type 2 diabetes mellitus (HCC) 04/29/2021   Panic attacks 12/07/2020   Chest congestion 12/07/2020   Hypertension associated with diabetes (HCC) 04/26/2018   OSA (obstructive sleep apnea) 03/04/2018   BPH (benign prostatic hyperplasia) 10/07/2015   EIC (epidermal inclusion cyst) 07/23/2015   Tobacco abuse 07/02/2015   SVT (supraventricular tachycardia) (HCC)    Intermittent palpitations 04/21/2014   Bradycardia 04/21/2014   Morbid obesity (HCC) 04/21/2014   Cirrhosis of liver (HCC) 11/05/2013   GERD (gastroesophageal reflux disease) 07/18/2012   Screening for colorectal cancer 07/18/2012   Pulmonary nodule 02/14/2012   Past Medical History:  Diagnosis Date   GERD  (gastroesophageal reflux disease)    HCV (hepatitis C virus) DR. ZACKS   AUG 2013 VIRAL LOAD UNDETECTABLE   History of cardiac catheterization    a. 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   CHOLECYSTECTOMY     COLONOSCOPY  10/04/2012   SLF:ONE/8 SIMPLE ADENOMA, TICS, SML IH   ESOPHAGOGASTRODUODENOSCOPY N/A 09/04/2014   Procedure: ESOPHAGOGASTRODUODENOSCOPY (EGD);  Surgeon: West Bali, MD;  [facility];  Service: Endoscopy;  Laterality: N/A;  145   GALLBLADDER SURGERY     JAW BROKEN     LIPOMA REMOVALS     SEVERAL   RIGHT/LEFT HEART CATH AND CORONARY ANGIOGRAPHY N/A 10/08/2018   Procedure: RIGHT/LEFT HEART CATH AND CORONARY ANGIOGRAPHY;  Surgeon: Swaziland, Peter M, MD;  Location: [facility] INVASIVE CV LAB;  Service: Cardiovascular;  Laterality: N/A;   SUPRAVENTRICULAR TACHYCARDIA ABLATION N/A 06/22/2014   Procedure: SUPRAVENTRICULAR TACHYCARDIA ABLATION;  Surgeon: Marinus Maw, MD;  Location: [facility] CATH LAB;  Service: Cardiovascular;  Laterality: N/A;   Social History   Occupational History   Occupation: concrete work    Associate Professor: SELF-EMPLOYED  Tobacco Use   Smoking status: Every Day    Current packs/day: 2.00    Average packs/day: 2.0 packs/day for 45.0 years (90.0 ttl pk-yrs)    Types: Cigarettes   Smokeless tobacco: Never   Tobacco comments:    smokes 1-1.5 packs per day  Vaping Use   Vaping status: Never Used  Substance and Sexual Activity   Alcohol use: No    Drug use: No    Comment: in the 1980s, smoke crack. None since Dec 2011.    Sexual activity: Not Currently

## 2023-04-09 DIAGNOSIS — R001 Bradycardia, unspecified: Secondary | ICD-10-CM

## 2023-04-09 DIAGNOSIS — R42 Dizziness and giddiness: Secondary | ICD-10-CM

## 2023-04-11 ENCOUNTER — Other Ambulatory Visit: Payer: Self-pay | Admitting: Family Medicine

## 2023-04-11 ENCOUNTER — Telehealth: Payer: Self-pay | Admitting: Family Medicine

## 2023-04-11 DIAGNOSIS — B372 Candidiasis of skin and nail: Secondary | ICD-10-CM

## 2023-04-11 MED ORDER — NYSTATIN 100000 UNIT/GM EX POWD
1.0000 | Freq: Three times a day (TID) | CUTANEOUS | 0 refills | Status: DC
Start: 2023-04-11 — End: 2023-10-15

## 2023-04-11 NOTE — Telephone Encounter (Signed)
  Prescription Request  04/11/2023  Is this a "Controlled Substance" medicine?   Have you seen your PCP in the last 2 weeks? No, made aware that he would need appt and he said to send message to PCP  If YES, route message to pool  -  If NO, patient needs to be scheduled for appointment.  What is the name of the medication or equipment? nystatin  Have you contacted your pharmacy to request a refill? yes   Which pharmacy would you like this sent to?  [facility] - [place], [place] - [address] (Ph: [phone])    Patient notified that their request is being sent to the clinical staff for review and that they should receive a response within 2 business days.

## 2023-04-11 NOTE — Telephone Encounter (Signed)
Sent. NOV if needs refills going forward.

## 2023-04-11 NOTE — Telephone Encounter (Signed)
Requesting Nystatin powder for rashes under arms & belly, not on current med list Please advise, uses [facility]

## 2023-04-18 ENCOUNTER — Ambulatory Visit (HOSPITAL_COMMUNITY): Admission: RE | Admit: 2023-04-18 | Payer: Self-pay | Source: Ambulatory Visit

## 2023-04-18 ENCOUNTER — Ambulatory Visit (HOSPITAL_COMMUNITY): Payer: No Typology Code available for payment source

## 2023-04-20 ENCOUNTER — Telehealth: Payer: Self-pay

## 2023-04-20 MED ORDER — FLECAINIDE ACETATE 50 MG PO TABS: 75.0000 mg | ORAL_TABLET | Freq: Two times a day (BID) | ORAL | 3 refills | Status: DC

## 2023-04-20 NOTE — Telephone Encounter (Signed)
-----   Message from [place] sent at 04/19/2023  7:04 PM EDT ----- Monitor shows frequent PACs (31% total) I would recomm increasing flecanide to 75 mg bid Get EKG in 2 wks

## 2023-04-23 ENCOUNTER — Other Ambulatory Visit: Payer: Self-pay | Admitting: Family Medicine

## 2023-04-23 ENCOUNTER — Telehealth: Payer: Self-pay | Admitting: Internal Medicine

## 2023-04-23 ENCOUNTER — Telehealth: Payer: Self-pay | Admitting: Pulmonary Disease

## 2023-04-23 DIAGNOSIS — B372 Candidiasis of skin and nail: Secondary | ICD-10-CM

## 2023-04-23 NOTE — Telephone Encounter (Signed)
Patient states that over the weekend he was very short of breath, dizzy and light headed. States his heart doctor upped meds and noticed it kind of started then- patient is going to call his heart doctor as well. Patient states his O2 was 31, so he used his oxygen and it make him feel better. Patient would like to know if he needs to go to the ER or schedule an acute visit with our office. Please advise.

## 2023-04-23 NOTE — Telephone Encounter (Signed)
Spoke with the patient who states that since increasing his flecainide to 75 mg twice daily he has felt poorly. He reports that Saturday he was sleepy, foggy-headed and dizzy. He states that his O2 was also down so he started using his oxygen. Yesterday he went back to reduced dose of 50 mg of flecainide. He reports that today he is feeling much better. He reports that he has also reached out to his pulmonologist about his O2 sats. He would like to know if symptoms could have been due to increase in medicine.

## 2023-04-23 NOTE — Telephone Encounter (Signed)
Pt c/o medication issue:  1. Name of Medication:  flecainide (TAMBOCOR) 50 MG tablet  2. How are you currently taking this medication (dosage and times per day)?   3. Are you having a reaction (difficulty breathing--STAT)?   4. What is your medication issue?   Patient states since this medication was increased it has caused dizziness and sleepiness. He states he cut back on it and started to feel a little better.

## 2023-04-25 NOTE — Telephone Encounter (Signed)
Spoke with patient and he has improved since the medication changes. He has no further complaints at this time.   He does report currently having no insurance coverage and Stiolto is >$400. He would like to know if there is another option that would be in a generic form and cheaper.   Please advise, thank you!

## 2023-04-25 NOTE — Telephone Encounter (Signed)
Per chart cardiology has changed his medications, he reported feeling somewhat improved.   LMTCB

## 2023-04-25 NOTE — Telephone Encounter (Signed)
I would recomm add low dose Toprol XL to regimen   25 mg   Will follow ectopy over time  Keep on 50 mg flecanide

## 2023-04-26 MED ORDER — METOPROLOL SUCCINATE ER 25 MG PO TB24: 25.0000 mg | ORAL_TABLET | Freq: Every day | ORAL | 3 refills | Status: DC

## 2023-04-26 MED ORDER — FLECAINIDE ACETATE 50 MG PO TABS: 50.0000 mg | ORAL_TABLET | Freq: Two times a day (BID) | ORAL | Status: DC

## 2023-04-26 NOTE — Telephone Encounter (Signed)
Pt advised and will let us know how he is doing.

## 2023-04-29 ENCOUNTER — Other Ambulatory Visit: Payer: Self-pay

## 2023-04-29 ENCOUNTER — Emergency Department (HOSPITAL_COMMUNITY)
Admission: EM | Admit: 2023-04-29 | Discharge: 2023-04-29 | Disposition: A | Discharge status: Home / Self Care | Payer: Self-pay | Attending: Emergency Medicine | Admitting: Emergency Medicine

## 2023-04-29 ENCOUNTER — Encounter (HOSPITAL_COMMUNITY): Payer: Self-pay | Admitting: *Deleted

## 2023-04-29 ENCOUNTER — Emergency Department (HOSPITAL_COMMUNITY): Payer: Self-pay

## 2023-04-29 DIAGNOSIS — I959 Hypotension, unspecified: Secondary | ICD-10-CM | POA: Insufficient documentation

## 2023-04-29 DIAGNOSIS — Z85828 Personal history of other malignant neoplasm of skin: Secondary | ICD-10-CM | POA: Insufficient documentation

## 2023-04-29 DIAGNOSIS — J449 Chronic obstructive pulmonary disease, unspecified: Secondary | ICD-10-CM | POA: Insufficient documentation

## 2023-04-29 DIAGNOSIS — F17210 Nicotine dependence, cigarettes, uncomplicated: Secondary | ICD-10-CM | POA: Insufficient documentation

## 2023-04-29 DIAGNOSIS — E119 Type 2 diabetes mellitus without complications: Secondary | ICD-10-CM | POA: Insufficient documentation

## 2023-04-29 DIAGNOSIS — Z7984 Long term (current) use of oral hypoglycemic drugs: Secondary | ICD-10-CM | POA: Insufficient documentation

## 2023-04-29 DIAGNOSIS — Z79899 Other long term (current) drug therapy: Secondary | ICD-10-CM | POA: Insufficient documentation

## 2023-04-29 DIAGNOSIS — Z0130 Encounter for examination of blood pressure without abnormal findings: Secondary | ICD-10-CM

## 2023-04-29 DIAGNOSIS — I10 Essential (primary) hypertension: Secondary | ICD-10-CM | POA: Insufficient documentation

## 2023-04-29 DIAGNOSIS — Z0001 Encounter for general adult medical examination with abnormal findings: Secondary | ICD-10-CM | POA: Insufficient documentation

## 2023-04-29 DIAGNOSIS — R059 Cough, unspecified: Secondary | ICD-10-CM | POA: Insufficient documentation

## 2023-04-29 LAB — CBC
HCT: 37.4 % — ABNORMAL LOW (ref 39.0–52.0)
Hemoglobin: 12.6 g/dL — ABNORMAL LOW (ref 13.0–17.0)
MCH: 32 pg (ref 26.0–34.0)
MCHC: 33.7 g/dL (ref 30.0–36.0)
MCV: 94.9 fL (ref 80.0–100.0)
Platelets: 136 10*3/uL — ABNORMAL LOW (ref 150–400)
RBC: 3.94 MIL/uL — ABNORMAL LOW (ref 4.22–5.81)
RDW: 13.8 % (ref 11.5–15.5)
WBC: 6.2 10*3/uL (ref 4.0–10.5)
nRBC: 0 % (ref 0.0–0.2)

## 2023-04-29 LAB — BASIC METABOLIC PANEL
Anion gap: 6 (ref 5–15)
BUN: 21 mg/dL — ABNORMAL HIGH (ref 6–20)
CO2: 25 mmol/L (ref 22–32)
Calcium: 8.6 mg/dL — ABNORMAL LOW (ref 8.9–10.3)
Chloride: 103 mmol/L (ref 98–111)
Creatinine, Ser: 0.75 mg/dL (ref 0.61–1.24)
GFR, Estimated: >60 mL/min (ref >60)
Glucose, Bld: 152 mg/dL — ABNORMAL HIGH (ref 70–99)
Potassium: 3.8 mmol/L (ref 3.5–5.1)
Sodium: 134 mmol/L — ABNORMAL LOW (ref 135–145)

## 2023-04-29 LAB — TROPONIN I (HIGH SENSITIVITY)
Troponin I (High Sensitivity): 6 ng/L (ref <18)
Troponin I (High Sensitivity): 6 ng/L (ref <18)

## 2023-04-29 NOTE — Discharge Instructions (Addendum)
We evaluated you for your low blood pressure.  Your blood pressure was improved in the emergency department.  We did not see any signs of any dangerous problems.  Your cardiac test was reassuring.  Your blood pressure here was improved.  Please follow-up closely with your primary doctor.  If you have any symptoms such as lightheadedness, dizziness, chest pain, shortness of breath, fevers or chills, painful urination, diarrhea, vomiting, or any other new medical symptoms then please come back for reassessment.

## 2023-04-29 NOTE — ED Notes (Signed)
Patient Alert and oriented to baseline. Stable and ambulatory to baseline. Patient verbalized understanding of the discharge instructions.  Patient belongings were taken by the patient.   

## 2023-04-29 NOTE — ED Notes (Signed)
Pt states only reason he came in today is because his BP was 86/46. States at that time he felt fine.

## 2023-04-29 NOTE — ED Provider Notes (Signed)
EMERGENCY DEPARTMENT AT [facility] Provider Note  CSN: [id number] Arrival date & time: 04/29/23 1117  Chief Complaint(s) Chest Pain  HPI Thomas Ball is a 62 y.o. male history of COPD, GERD, hypertension, hyperlipidemia, diabetes, presenting with low blood pressure.  Patient reports that he checks his blood pressure every morning.  He checked his blood pressure this morning and it was 80/60.  He reports that otherwise he felt fine.  He rechecked it and it was 119 systolic and then 110 systolic.  He felt that he needed to come and get evaluated.  He reports that he occasionally has cough and shortness of breath from his chronic COPD, has been having this for the past month on and off but not severe and chronic.  He also reports occasional chest pain with cough.  He did not have any change or worsening of his symptoms today.  Also denies any fevers or chills, abdominal pain, nausea or vomiting, painful urination, diarrhea, or any other new symptoms.   Past Medical History Past Medical History:  Diagnosis Date   GERD (gastroesophageal reflux disease)    HCV (hepatitis C virus) DR. ZACKS   AUG 2013 VIRAL LOAD UNDETECTABLE   History of cardiac catheterization    a. 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   Sleep apnea    SVT (supraventricular tachycardia)    Patient Active Problem List   Diagnosis Date Noted   Low back pain 04/05/2023   URI with cough and congestion 04/05/2023   Tooth abscess 04/05/2023   Long term (current) use of oral hypoglycemic drugs 03/13/2023   Diabetes mellitus type II, controlled (HCC) 03/13/2023   Hyperlipidemia associated with type 2 diabetes mellitus (HCC) 04/29/2021   Panic attacks 12/07/2020   Chest congestion 12/07/2020   Hypertension associated with diabetes (HCC) 04/26/2018   OSA (obstructive sleep  apnea) 03/04/2018   BPH (benign prostatic hyperplasia) 10/07/2015   EIC (epidermal inclusion cyst) 07/23/2015   Tobacco abuse 07/02/2015   SVT (supraventricular tachycardia) (HCC)    Intermittent palpitations 04/21/2014   Bradycardia 04/21/2014   Morbid obesity (HCC) 04/21/2014   Cirrhosis of liver (HCC) 11/05/2013   GERD (gastroesophageal reflux disease) 07/18/2012   Screening for colorectal cancer 07/18/2012   Pulmonary nodule 02/14/2012   Home Medication(s) Prior to Admission medications   Medication Sig Start Date End Date Taking? Authorizing Provider  albuterol (PROVENTIL) (2.5 MG/3ML) 0.083% nebulizer solution Take 3 mLs (2.5 mg total) by nebulization every 6 (six) hours as needed for wheezing or shortness of breath. 08/04/21   Sonny Masters, FNP  albuterol (VENTOLIN HFA) 108 (90 Base) MCG/ACT inhaler Inhale 2 puffs into the lungs every 6 (six) hours as needed for wheezing or shortness of breath. 03/21/22   Glenford Bayley, NP  atorvastatin (LIPITOR) 40 MG tablet TAKE 1 TABLET DAILY AT 6PM NEEDS TO BE SEEN 03/14/23   Delynn Flavin M, DO  doxazosin (CARDURA) 1 MG tablet Take 1 tablet (1 mg total) by mouth daily. 01/18/21   Raliegh Ip, DO  flecainide (TAMBOCOR) 50 MG tablet Take 1 tablet (50 mg total) by mouth 2 (two) times daily. 04/26/23   Pricilla Riffle, MD  fluticasone [facility]) 50 MCG/ACT nasal spray Place 2 sprays into both nostrils daily. 04/05/23   St Vena Austria, NP  metFORMIN (  GLUCOPHAGE) 500 MG tablet TAKE ONE TABLET BY MOUTH TWICE DAILY 02/12/23   Delynn Flavin M, DO  metoprolol succinate (TOPROL XL) 25 MG 24 hr tablet Take 1 tablet (25 mg total) by mouth daily. 04/26/23   Pricilla Riffle, MD  nystatin powder Apply 1 Application topically 3 (three) times daily. X14 per flareup 04/11/23   Delynn Flavin M, DO  omeprazole (PRILOSEC) 40 MG capsule TAKE ONE (1) CAPSULE EACH DAY 05/12/22   Coralyn Helling, MD  Tiotropium Bromide-Olodaterol (STIOLTO RESPIMAT)  2.5-2.5 MCG/ACT AERS Inhale 2 puffs into the lungs daily. 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   CHOLECYSTECTOMY     COLONOSCOPY  10/04/2012   SLF:ONE/8 SIMPLE ADENOMA, TICS, SML IH   ESOPHAGOGASTRODUODENOSCOPY N/A 09/04/2014   Procedure: ESOPHAGOGASTRODUODENOSCOPY (EGD);  Surgeon: West Bali, MD;  Location: [facility];  Service: Endoscopy;  Laterality: N/A;  145   GALLBLADDER SURGERY     JAW BROKEN     LIPOMA REMOVALS     SEVERAL   RIGHT/LEFT HEART CATH AND CORONARY ANGIOGRAPHY N/A 10/08/2018   Procedure: RIGHT/LEFT HEART CATH AND CORONARY ANGIOGRAPHY;  Surgeon: Swaziland, Peter M, MD;  Location: [facility] INVASIVE CV LAB;  Service: Cardiovascular;  Laterality: N/A;   SUPRAVENTRICULAR TACHYCARDIA ABLATION N/A 06/22/2014   Procedure: SUPRAVENTRICULAR TACHYCARDIA ABLATION;  Surgeon: Marinus Maw, MD;  Location: [facility] CATH LAB;  Service: Cardiovascular;  Laterality: N/A;   Family History Family History  Problem Relation Age of Onset   Emphysema Mother    COPD Mother    Early death Mother    Hyperlipidemia Father    Stroke Father    Diabetes Father    High Cholesterol Father    Colon cancer Neg Hx     Social History Social History   Tobacco Use   Smoking status: Every Day    Current packs/day: 2.00    Average packs/day: 2.0 packs/day for 45.0 years (90.0 ttl pk-yrs)    Types: Cigarettes   Smokeless tobacco: Never   Tobacco comments:    smokes 1-1.5 packs per day  Vaping Use   Vaping status: Never Used  Substance Use Topics   Alcohol use: No   Drug use: No    Comment: in the 1980s, smoke crack. None since Dec 2011.    Allergies Patient has no known allergies.  Review of Systems Review of Systems  All other systems reviewed and are  negative.   Physical Exam Vital Signs  I have reviewed the triage vital signs BP (!) 142/71   Pulse 88   Temp 98.3 F (36.8 C) (Oral)   Resp  13   Ht 5\' 5"  (1.651 m)   Wt 136.1 kg   SpO2 95%   BMI 49.92 kg/m  Physical Exam Vitals and nursing note reviewed.  Constitutional:      General: He is not in acute distress.    Appearance: Normal appearance.  HENT:     Mouth/Throat:     Mouth: Mucous membranes are moist.  Eyes:     Conjunctiva/sclera: Conjunctivae normal.  Cardiovascular:     Rate and Rhythm: Normal rate and regular rhythm.  Pulmonary:     Effort: Pulmonary effort is normal. No respiratory distress.     Breath sounds: Normal breath sounds.  Abdominal:     General: Abdomen is flat.     Palpations: Abdomen is soft.     Tenderness: There is no abdominal tenderness.  Musculoskeletal:     Right lower leg: No edema.     Left lower leg: No edema.  Skin:    General: Skin is warm and dry.     Capillary Refill: Capillary refill takes less than 2 seconds.  Neurological:     Mental Status: He is alert and oriented to person, place, and time. Mental status is at baseline.  Psychiatric:        Mood and Affect: Mood normal.        Behavior: Behavior normal.     ED Results and Treatments Labs (all labs ordered are listed, but only abnormal results are displayed) Labs Reviewed  BASIC METABOLIC PANEL - Abnormal; Notable for the following components:      Result Value   Sodium 134 (*)    Glucose, Bld 152 (*)    BUN 21 (*)    Calcium 8.6 (*)    All other components within normal limits  CBC - Abnormal; Notable for the following components:   RBC 3.94 (*)    Hemoglobin 12.6 (*)    HCT 37.4 (*)    Platelets 136 (*)    All other components within normal limits  TROPONIN I (HIGH SENSITIVITY)  TROPONIN I (HIGH SENSITIVITY)                                                                                                                          Radiology DG Chest  Portable 1 View  Result Date: 04/29/2023 CLINICAL DATA:  Chest pain and shortness of breath for the past few weeks. EXAM: PORTABLE CHEST 1 VIEW COMPARISON:  CT chest dated October 18, 2022. Chest x-ray dated March 21, 2022. FINDINGS: Mild cardiomegaly. Low lung volumes are present, causing crowding of the pulmonary vasculature. Unchanged calcified granuloma in the left upper lobe. No focal consolidation, pleural effusion, or pneumothorax. No acute osseous abnormality. IMPRESSION: No active disease. Electronically Signed   By: Obie Dredge M.D.   On: 04/29/2023 12:03    Pertinent labs & imaging results that were available during my care of the patient were reviewed by me and considered in my medical decision making (see  MDM for details).  Medications Ordered in ED Medications - No data to display                                                                                                                                   Procedures Procedures  (including critical care time)  Medical Decision Making / ED Course   MDM:  62 year old male presenting to the emergency department with episode of low blood pressure.  Patient overall very well-appearing, physical exam reassuring, no focal abnormality.  Lungs clear.  Unclear cause of low blood pressure, suspect possibly erroneous measurement given that he rechecked it immediately afterwards and it was normal.  He also felt normal during this time.  He drove himself to the emergency department.  He denies any infectious symptoms, fevers or chills to suggest occult infectious process.  He reports chronic chest pain with cough which sounds atypical for any ACS and had normal stress test last year, EKG here with supraventricular bigeminy but no heart block.  No sign of severe anemia.  Initial troponin is negative.  Does not appear dehydrated.  Chest x-ray is clear.  If delta troponin is also negative then anticipate discharge.  Clinical Course as of  04/30/23 [id number]  Wynelle Link Apr 29, 2023  1530 Patient feels at baseline, ambulatory, BP check prior to discharge 142/71. Delta troponin negative, patient ambulatory. Advised outpatient follow up and continuing to check BP at home and keep log. Will discharge patient to home. All questions answered. Patient comfortable with plan of discharge. Return precautions discussed with patient and specified on the after visit summary.  [WS]    Clinical Course User Index [WS] Lonell Grandchild, MD     Additional history obtained: -External records from outside source obtained and reviewed including: Chart review including previous notes, labs, imaging, consultation notes including prior cardiology and PMD notes   Lab Tests: -I ordered, reviewed, and interpreted labs.   The pertinent results include:   Labs Reviewed  BASIC METABOLIC PANEL - Abnormal; Notable for the following components:      Result Value   Sodium 134 (*)    Glucose, Bld 152 (*)    BUN 21 (*)    Calcium 8.6 (*)    All other components within normal limits  CBC - Abnormal; Notable for the following components:   RBC 3.94 (*)    Hemoglobin 12.6 (*)    HCT 37.4 (*)    Platelets 136 (*)    All other components within normal limits  TROPONIN I (HIGH SENSITIVITY)  TROPONIN I (HIGH SENSITIVITY)    Notable for mild hyperglycemia, normal troponin ,no AKI, no severe anemia  EKG   EKG Interpretation Date/Time:  Sunday April 29 2023 13:23:08 EDT Ventricular Rate:  71 PR Interval:  121 QRS Duration:  96 QT Interval:  420 QTC Calculation: 457 R Axis:   22  Text Interpretation: sinus rhythm with supraventricular bigeminy  Low voltage, precordial leads Confirmed by Alvino Blood ([id number]) on 04/29/2023 1:31:18 PM         Imaging Studies ordered: I ordered imaging studies including CXR On my interpretation imaging demonstrates no acute process I independently visualized and interpreted imaging. I agree with the radiologist  interpretation   Medicines ordered and prescription drug management: No orders of the defined types were placed in this encounter.   -I have reviewed the patients home medicines and have made adjustments as needed   Cardiac Monitoring: The patient was maintained on a cardiac monitor.  I personally viewed and interpreted the cardiac monitored which showed an underlying rhythm of: sinus rhythm with supraventricular bigeminy   Social Determinants of Health:  Diagnosis or treatment significantly limited by social determinants of health: current smoker and obesity   Reevaluation: After the interventions noted above, I reevaluated the patient and found that their symptoms have improved  Co morbidities that complicate the patient evaluation  Past Medical History:  Diagnosis Date   GERD (gastroesophageal reflux disease)    HCV (hepatitis C virus) DR. ZACKS   AUG 2013 VIRAL LOAD UNDETECTABLE   History of cardiac catheterization    a. 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   Sleep apnea    SVT (supraventricular tachycardia)       Dispostion: Disposition decision including need for hospitalization was considered, and patient discharged from emergency department.    Final Clinical Impression(s) / ED Diagnoses Final diagnoses:  Encounter for examination of blood pressure without abnormal findings     This chart was dictated using voice recognition software.  Despite best efforts to proofread,  errors can occur which can change the documentation meaning.    Lonell Grandchild, MD 04/30/23 [phone]

## 2023-04-29 NOTE — ED Triage Notes (Signed)
Pt with mid CP and SOB for few weeks. Pt states he checked his BP at home and BP low 86/46. BP in triage 104/60

## 2023-04-30 ENCOUNTER — Telehealth: Payer: Self-pay

## 2023-04-30 NOTE — Telephone Encounter (Signed)
Transition Care Management Unsuccessful Follow-up Telephone Call  Date of discharge and from where:  04/29/23 [facility]  Attempts:  1st Attempt  Reason for unsuccessful TCM follow-up call:  Left voice message

## 2023-05-01 NOTE — Telephone Encounter (Signed)
Called patient, he did not answer. Left message for him to call back.

## 2023-05-01 NOTE — Telephone Encounter (Signed)
Can try sending script for tiotropium handihaler 1 puff daily, and salmeterol one puff bid.

## 2023-05-02 ENCOUNTER — Telehealth: Payer: Self-pay | Admitting: Internal Medicine

## 2023-05-02 NOTE — Telephone Encounter (Signed)
He was just in ER  Can he come in with BP cuff tomorrow for check / comparison

## 2023-05-02 NOTE — Telephone Encounter (Signed)
I spoke with patient.  He was seen in ED on 8/4 due to low BP.  Patient reports he has been having dizziness for a long time. Seems to be worse recently.  Has not passed out.  Feels foggy headed.   Most recent BP today was 103/62. Heart rate 62. He does not drink much water during the day and drinks 5-6 Coke zero's daily.  He will try and decrease coke zero intake and increase water intake. I told patient we would send message to Dr Tenny Craw and then call him back with recommendations.

## 2023-05-02 NOTE — Telephone Encounter (Signed)
Attempted phone call to pt and left voicemail message to contact RN at [phone]. 

## 2023-05-02 NOTE — Telephone Encounter (Signed)
Pt is returning call.  

## 2023-05-02 NOTE — Telephone Encounter (Signed)
Pt c/o BP issue: STAT if pt c/o blurred vision, one-sided weakness or slurred speech  1. What are your last 5 BP readings?  8/04: 85/46 8/07: 105/48, 101/69, 104/59  2. Are you having any other symptoms (ex. Dizziness, headache, blurred vision, passed out)? Dizziness   3. What is your BP issue?  Patient's BP is low and he is dizzy/off-balance.

## 2023-05-02 NOTE — Telephone Encounter (Signed)
Attempted phone call to pt.  No voicemail message left with this call.

## 2023-05-02 NOTE — Telephone Encounter (Signed)
Patient is returning call. Please advise? 

## 2023-05-03 NOTE — Telephone Encounter (Signed)
Spoke with patient. States feeling much better today, "better than in a long time actually". Patient states he will bring his blood pressure monitor from home to his nurse visit on 8/12 to have it checked for accuracy. No further needs at this time

## 2023-05-07 ENCOUNTER — Ambulatory Visit: Payer: Self-pay | Attending: Cardiovascular Disease

## 2023-05-07 VITALS — HR 60 | Ht 65.0 in | Wt 312.6 lb

## 2023-05-07 DIAGNOSIS — I491 Atrial premature depolarization: Secondary | ICD-10-CM

## 2023-05-07 NOTE — Progress Notes (Signed)
   Nurse Visit   Date of Encounter: 05/07/2023 ID: Thomas Ball, DOB 1961/07/05, MRN [id number]  PCP:  Raliegh Ip, DO   [facility] Providers Cardiologist:  Dietrich Pates, MD Electrophysiologist:  Lewayne Bunting, MD      Visit Details   VS:  There were no vitals taken for this visit. , BMI There is no height or weight on file to calculate BMI.  Wt Readings from Last 3 Encounters:  04/29/23 300 lb (136.1 kg)  04/05/23 299 lb 3.2 oz (135.7 kg)  03/22/23 299 lb (135.6 kg)     Reason for visit: EKG; flecainide decrease to 50 mg PO BID from 75 mg. Pt reported felt terrible on 75 mg.  Flecainide ordered for frequent PAC's Performed today: Vitals, EKG, Provider consulted:Dr. Nahser, and Education Changes (medications, testing, etc.) : continue as ordered Length of Visit: 20 minutes    Medications Adjustments/Labs and Tests Ordered: No orders of the defined types were placed in this encounter.  No orders of the defined types were placed in this encounter.    Signed, Macie Burows, RN  05/07/2023 9:20 AM

## 2023-05-07 NOTE — Patient Instructions (Signed)
Medication Instructions:  Your physician recommends that you continue on your current medications as directed. Please refer to the Current Medication list given to you today.  *If you need a refill on your cardiac medications before your next appointment, please call your pharmacy*   Lab Work: NONE If you have labs (blood work) drawn today and your tests are completely normal, you will receive your results only by: MyChart Message (if you have MyChart) OR A paper copy in the mail If you have any lab test that is abnormal or we need to change your treatment, we will call you to review the results.   Testing/Procedures: NONE   Follow-Up:As ordered At [facility], you and your health needs are our priority.  As part of our continuing mission to provide you with exceptional heart care, we have created designated Provider Care Teams.  These Care Teams include your primary Cardiologist (physician) and Advanced Practice Providers (APPs -  Physician Assistants and Nurse Practitioners) who all work together to provide you with the care you need, when you need it.   Provider:   Dietrich Pates, MD

## 2023-05-08 ENCOUNTER — Telehealth: Payer: Self-pay | Admitting: Family Medicine

## 2023-05-08 NOTE — Telephone Encounter (Signed)
I don't think we are carrying anymore.  I've messaged Raynelle Fanning.

## 2023-05-09 NOTE — Progress Notes (Signed)
Would recomm monitor (72 hour) in October (3 months from last one)

## 2023-05-10 ENCOUNTER — Other Ambulatory Visit (HOSPITAL_BASED_OUTPATIENT_CLINIC_OR_DEPARTMENT_OTHER): Payer: Self-pay

## 2023-05-10 ENCOUNTER — Ambulatory Visit: Payer: Self-pay | Attending: Internal Medicine

## 2023-05-10 ENCOUNTER — Telehealth: Payer: Self-pay

## 2023-05-10 ENCOUNTER — Other Ambulatory Visit: Payer: Self-pay | Admitting: Pharmacist

## 2023-05-10 DIAGNOSIS — I491 Atrial premature depolarization: Secondary | ICD-10-CM

## 2023-05-10 DIAGNOSIS — R001 Bradycardia, unspecified: Secondary | ICD-10-CM

## 2023-05-10 DIAGNOSIS — I471 Supraventricular tachycardia, unspecified: Secondary | ICD-10-CM

## 2023-05-10 NOTE — Telephone Encounter (Signed)
#  5 boxes of Stiolto given to PCP on 05/08/23  Does patient need ref2016 or were you going to send to health dept?  I forgot to ask!  Thanks!

## 2023-05-10 NOTE — Telephone Encounter (Signed)
Per Dr Tenny Craw:   Would recomm monitor (72 hour) in October (3 months from last one)    Zio ordered and my chart sent to the pt.

## 2023-05-10 NOTE — Progress Notes (Unsigned)
Enrolled for [employer] to mail a ZIO XT long term holter monitor to the patients address on file. Requested monitor to be mailed 07/02/24 and applied 07/09/24.

## 2023-05-10 NOTE — Progress Notes (Signed)
   05/10/2023 Name: Thomas Ball MRN: [id number] DOB: Sep 23, 1961  COPD  Patient reports affordability concerns with their medications: Yes ; uninsured  Current medications: stiolto samples today, albuterol rescue Medications tried in the past: spiriva, symbicort, dulera  Reports 1 exacerbations in the past year  Current medication access support: none; uninsured   Objective:  Lab Results  Component Value Date   HGBA1C 6.3 (H) 03/13/2023    Lab Results  Component Value Date   CREATININE 0.75 04/29/2023   BUN 21 (H) 04/29/2023   NA 134 (L) 04/29/2023   K 3.8 04/29/2023   CL 103 04/29/2023   CO2 25 04/29/2023    Lab Results  Component Value Date   CHOL 105 03/13/2023   HDL 39 (L) 03/13/2023   LDLCALC 51 03/13/2023   TRIG 74 03/13/2023   CHOLHDL 2.7 03/13/2023    Medications Reviewed Today     Reviewed by Danella Maiers, RPH (Pharmacist) on 05/10/23 at 1017  Med List Status: <None>   Medication Order Taking? Sig Documenting Provider Last Dose Status Informant  albuterol (PROVENTIL) (2.5 MG/3ML) 0.083% nebulizer solution [id number] No Take 3 mLs (2.5 mg total) by nebulization every 6 (six) hours as needed for wheezing or shortness of breath. Sonny Masters, FNP Taking Active   albuterol (VENTOLIN HFA) 108 (90 Base) MCG/ACT inhaler [id number] No Inhale 2 puffs into the lungs every 6 (six) hours as needed for wheezing or shortness of breath. Glenford Bayley, NP Taking Active   atorvastatin (LIPITOR) 40 MG tablet [id number] No TAKE 1 TABLET DAILY AT 6PM NEEDS TO BE SEEN Delynn Flavin M, DO Taking Active   doxazosin (CARDURA) 1 MG tablet [id number] No Take 1 tablet (1 mg total) by mouth daily. Raliegh Ip, DO Taking Active Self  flecainide (TAMBOCOR) 50 MG tablet [id number] No Take 1 tablet (50 mg total) by mouth 2 (two) times daily. Pricilla Riffle, MD Taking Active   fluticasone [facility]) 50 MCG/ACT nasal spray [id number] No Place 2 sprays into both nostrils  daily. [place] Lighter, Dois Davenport, NP Taking Active   metFORMIN (GLUCOPHAGE) 500 MG tablet [id number] No TAKE ONE TABLET BY MOUTH TWICE DAILY Delynn Flavin M, DO Taking Active   metoprolol succinate (TOPROL XL) 25 MG 24 hr tablet [id number] No Take 1 tablet (25 mg total) by mouth daily. Pricilla Riffle, MD Taking Active   nystatin powder [id number] No Apply 1 Application topically 3 (three) times daily. X14 per flareup Raliegh Ip, DO Taking Active   omeprazole (PRILOSEC) 40 MG capsule [id number] No TAKE ONE (1) CAPSULE EACH DAY Sood, Schurz, MD Taking Active   Tiotropium Bromide-Olodaterol (STIOLTO RESPIMAT) 2.5-2.5 MCG/ACT AERS [id number] No Inhale 2 puffs into the lungs daily. Coralyn Helling, MD Taking Active   Med List Note Dianah Field 05/04/21 1200): Cpap              Assessment/Plan:   Assisted PCP with Stiolto samples Referred patient to [facility] health dept for medication assistance    Kieth Brightly, PharmD, BCACP Clinical Pharmacist, [facility]

## 2023-05-10 NOTE — Telephone Encounter (Signed)
-----   Message from Dietrich Pates sent at 05/09/2023  3:44 AM EDT -----    ----- Message ----- From: Macie Burows, RN Sent: 05/07/2023   9:49 AM EDT To: Pricilla Riffle, MD

## 2023-05-11 ENCOUNTER — Ambulatory Visit (HOSPITAL_COMMUNITY): Admission: RE | Admit: 2023-05-11 | Payer: Self-pay | Source: Ambulatory Visit

## 2023-05-11 ENCOUNTER — Other Ambulatory Visit: Payer: Self-pay | Admitting: Family Medicine

## 2023-05-11 DIAGNOSIS — E119 Type 2 diabetes mellitus without complications: Secondary | ICD-10-CM

## 2023-05-11 NOTE — Telephone Encounter (Signed)
As long as he is amenable to change, we'll get him to HD for med assist Kelci, please let pt know we can get him meds through HDept. Will set up paperwork for him.

## 2023-05-11 NOTE — Telephone Encounter (Signed)
If he can do HDPt I'm sure he will.  You weren't sure if they offered that one or not when we talked.

## 2023-05-14 NOTE — Telephone Encounter (Signed)
Attempted to call pt , no answer left vm for cb  

## 2023-05-24 NOTE — Telephone Encounter (Signed)
Left detailed vm for pt to call back regarding medication - closing call

## 2023-05-30 ENCOUNTER — Ambulatory Visit (HOSPITAL_COMMUNITY)
Admission: RE | Admit: 2023-05-30 | Discharge: 2023-05-30 | Disposition: A | Discharge status: Home / Self Care | Payer: Self-pay | Source: Ambulatory Visit | Attending: Acute Care | Admitting: Acute Care

## 2023-05-30 DIAGNOSIS — Z87891 Personal history of nicotine dependence: Secondary | ICD-10-CM | POA: Insufficient documentation

## 2023-05-30 DIAGNOSIS — R911 Solitary pulmonary nodule: Secondary | ICD-10-CM | POA: Insufficient documentation

## 2023-06-04 ENCOUNTER — Telehealth: Payer: Self-pay | Admitting: *Deleted

## 2023-06-04 NOTE — Telephone Encounter (Signed)
Returned call to patient. Results have not yet been released.  Will notify patient once results are in and have been reviewed.  Patient acknowledged understanding

## 2023-06-04 NOTE — Telephone Encounter (Signed)
Calling back for LCS results. Pls call @ [phone]

## 2023-06-11 ENCOUNTER — Other Ambulatory Visit: Payer: Self-pay | Admitting: Acute Care

## 2023-06-11 DIAGNOSIS — Z122 Encounter for screening for malignant neoplasm of respiratory organs: Secondary | ICD-10-CM

## 2023-06-11 DIAGNOSIS — F17210 Nicotine dependence, cigarettes, uncomplicated: Secondary | ICD-10-CM

## 2023-06-11 DIAGNOSIS — Z87891 Personal history of nicotine dependence: Secondary | ICD-10-CM

## 2023-06-25 ENCOUNTER — Encounter: Payer: Self-pay | Admitting: Family Medicine

## 2023-06-25 ENCOUNTER — Ambulatory Visit (INDEPENDENT_AMBULATORY_CARE_PROVIDER_SITE_OTHER): Payer: Self-pay | Admitting: Family Medicine

## 2023-06-25 VITALS — BP 109/64 | HR 85 | Temp 98.5°F | Ht 65.0 in | Wt 315.0 lb

## 2023-06-25 DIAGNOSIS — I152 Hypertension secondary to endocrine disorders: Secondary | ICD-10-CM

## 2023-06-25 DIAGNOSIS — E785 Hyperlipidemia, unspecified: Secondary | ICD-10-CM

## 2023-06-25 DIAGNOSIS — I878 Other specified disorders of veins: Secondary | ICD-10-CM

## 2023-06-25 DIAGNOSIS — N529 Male erectile dysfunction, unspecified: Secondary | ICD-10-CM

## 2023-06-25 DIAGNOSIS — E1159 Type 2 diabetes mellitus with other circulatory complications: Secondary | ICD-10-CM

## 2023-06-25 DIAGNOSIS — Z7984 Long term (current) use of oral hypoglycemic drugs: Secondary | ICD-10-CM

## 2023-06-25 DIAGNOSIS — E1169 Type 2 diabetes mellitus with other specified complication: Secondary | ICD-10-CM

## 2023-06-25 DIAGNOSIS — Z23 Encounter for immunization: Secondary | ICD-10-CM

## 2023-06-25 LAB — BAYER DCA HB A1C WAIVED: HB A1C (BAYER DCA - WAIVED): 5.6 % (ref 4.8–5.6)

## 2023-06-25 MED ORDER — SILDENAFIL CITRATE 100 MG PO TABS
50.0000 mg | ORAL_TABLET | Freq: Every day | ORAL | 11 refills | Status: AC | PRN
Start: 2023-06-25 — End: ?

## 2023-06-25 MED ORDER — ATORVASTATIN CALCIUM 40 MG PO TABS: 40.0000 mg | ORAL_TABLET | Freq: Every day | ORAL | 3 refills | Status: DC

## 2023-06-25 MED ORDER — METFORMIN HCL 500 MG PO TABS
500.0000 mg | ORAL_TABLET | Freq: Two times a day (BID) | ORAL | 3 refills | Status: DC
Start: 2023-06-25 — End: 2024-01-21

## 2023-06-25 NOTE — Patient Instructions (Signed)
Continue to monitor your blood sugars as we discussed.  Take your medication as directed.    TED hose from [employer] to help with leg swelling  Where to find more information: American Diabetes Association: diabetes.org/food-and-fitness/food Academy of Nutrition and Dietetics: https://www.vargas.com/ [employer] of Diabetes and Digestive and Kidney Diseases (NIH): FindJewelers.cz American heart association website will have information about salt:   American Heart Association (AHA): heart.org Academy of Nutrition and Dietetics: eatright.org National Kidney Foundation (NKF): kidney.org  Understanding your Hemoglobin A1c:    DASH Eating Plan DASH stands for Dietary Approaches to Stop Hypertension. The DASH eating plan is a healthy eating plan that has been shown to: Lower high blood pressure (hypertension). Reduce your risk for type 2 diabetes, heart disease, and stroke. Help with weight loss. What are tips for following this plan? Reading food labels Check food labels for the amount of salt (sodium) per serving. Choose foods with less than 5 percent of the Daily Value (DV) of sodium. In general, foods with less than 300 milligrams (mg) of sodium per serving fit into this eating plan. To find whole grains, look for the word "whole" as the first word in the ingredient list. Shopping Buy products labeled as "low-sodium" or "no salt added." Buy fresh foods. Avoid canned foods and pre-made or frozen meals. Cooking Try not to add salt when you cook. Use salt-free seasonings or herbs instead of table salt or sea salt. Check with your health care provider or pharmacist before using salt substitutes. Do not fry foods. Cook foods in healthy ways, such as baking, boiling, grilling, roasting, or broiling. Cook using oils that are good for your heart. These include olive, canola,  avocado, soybean, and sunflower oil. Meal planning  Eat a balanced diet. This should include: 4 or more servings of fruits and 4 or more servings of vegetables each day. Try to fill half of your plate with fruits and vegetables. 6-8 servings of whole grains each day. 6 or less servings of lean meat, poultry, or fish each day. 1 oz is 1 serving. A 3 oz (85 g) serving of meat is about the same size as the palm of your hand. One egg is 1 oz (28 g). 2-3 servings of low-fat dairy each day. One serving is 1 cup (237 mL). 1 serving of nuts, seeds, or beans 5 times each week. 2-3 servings of heart-healthy fats. Healthy fats called omega-3 fatty acids are found in foods such as walnuts, flaxseeds, fortified milks, and eggs. These fats are also found in cold-water fish, such as sardines, salmon, and mackerel. Limit how much you eat of: Canned or prepackaged foods. Food that is high in trans fat, such as fried foods. Food that is high in saturated fat, such as fatty meat. Desserts and other sweets, sugary drinks, and other foods with added sugar. Full-fat dairy products. Do not salt foods before eating. Do not eat more than 4 egg yolks a week. Try to eat at least 2 vegetarian meals a week. Eat more home-cooked food and less restaurant, buffet, and fast food. Lifestyle When eating at a restaurant, ask if your food can be made with less salt or no salt. If you drink alcohol: Limit how much you have to: 0-1 drink a day if you are male. 0-2 drinks a day if you are male. Know how much alcohol is in your drink. In the [place] one drink is one 12 oz bottle of beer (355 mL), one 5 oz glass of wine (148  mL), or one 1 oz glass of hard liquor (44 mL). General information Avoid eating more than 2,300 mg of salt a day. If you have hypertension, you may need to reduce your sodium intake to 1,500 mg a day. Work with your provider to stay at a healthy body weight or lose weight. Ask what the best weight range is  for you. On most days of the week, get at least 30 minutes of exercise that causes your heart to beat faster. This may include walking, swimming, or biking. Work with your provider or dietitian to adjust your eating plan to meet your specific calorie needs. What foods should I eat? Fruits All fresh, dried, or frozen fruit. Canned fruits that are in their natural juice and do not have sugar added to them. Vegetables Fresh or frozen vegetables that are raw, steamed, roasted, or grilled. Low-sodium or reduced-sodium tomato and vegetable juice. Low-sodium or reduced-sodium tomato sauce and tomato paste. Low-sodium or reduced-sodium canned vegetables. Grains Whole-grain or whole-wheat bread. Whole-grain or whole-wheat pasta. Brown rice. Orpah Cobb. Bulgur. Whole-grain and low-sodium cereals. Pita bread. Low-fat, low-sodium crackers. Whole-wheat flour tortillas. Meats and other proteins Skinless chicken or [place]. Ground chicken or [place]. Pork with fat trimmed off. Fish and seafood. Egg whites. Dried beans, peas, or lentils. Unsalted nuts, nut butters, and seeds. Unsalted canned beans. Lean cuts of beef with fat trimmed off. Low-sodium, lean precooked or cured meat, such as sausages or meat loaves. Dairy Low-fat (1%) or fat-free (skim) milk. Reduced-fat, low-fat, or fat-free cheeses. Nonfat, low-sodium ricotta or cottage cheese. Low-fat or nonfat yogurt. Low-fat, low-sodium cheese. Fats and oils Soft margarine without trans fats. Vegetable oil. Reduced-fat, low-fat, or light mayonnaise and salad dressings (reduced-sodium). Canola, safflower, olive, avocado, soybean, and sunflower oils. Avocado. Seasonings and condiments Herbs. Spices. Seasoning mixes without salt. Other foods Unsalted popcorn and pretzels. Fat-free sweets. The items listed above may not be all the foods and drinks you can have. Talk to a dietitian to learn more. What foods should I avoid? Fruits Canned fruit in a light or  heavy syrup. Fried fruit. Fruit in cream or butter sauce. Vegetables Creamed or fried vegetables. Vegetables in a cheese sauce. Regular canned vegetables that are not marked as low-sodium or reduced-sodium. Regular canned tomato sauce and paste that are not marked as low-sodium or reduced-sodium. Regular tomato and vegetable juices that are not marked as low-sodium or reduced-sodium. Rosita Fire. Olives. Grains Baked goods made with fat, such as croissants, muffins, or some breads. Dry pasta or rice meal packs. Meats and other proteins Fatty cuts of meat. Ribs. Fried meat. Tomasa Blase. Bologna, salami, and other precooked or cured meats, such as sausages or meat loaves, that are not lean and low in sodium. Fat from the back of a pig (fatback). Bratwurst. Salted nuts and seeds. Canned beans with added salt. Canned or smoked fish. Whole eggs or egg yolks. Chicken or [place] with skin. Dairy Whole or 2% milk, cream, and half-and-half. Whole or full-fat cream cheese. Whole-fat or sweetened yogurt. Full-fat cheese. Nondairy creamers. Whipped toppings. Processed cheese and cheese spreads. Fats and oils Butter. Stick margarine. Lard. Shortening. Ghee. Bacon fat. Tropical oils, such as coconut, palm kernel, or palm oil. Seasonings and condiments Onion salt, garlic salt, seasoned salt, table salt, and sea salt. Worcestershire sauce. Tartar sauce. Barbecue sauce. Teriyaki sauce. Soy sauce, including reduced-sodium soy sauce. Steak sauce. Canned and packaged gravies. Fish sauce. Oyster sauce. Cocktail sauce. Store-bought horseradish. Ketchup. Mustard. Meat flavorings and tenderizers. Bouillon cubes. Hot  sauces. Pre-made or packaged marinades. Pre-made or packaged taco seasonings. Relishes. Regular salad dressings. Other foods Salted popcorn and pretzels. The items listed above may not be all the foods and drinks you should avoid. Talk to a dietitian to learn more. Where to find more information National Heart, Lung, and  Blood Institute (NHLBI): BuffaloDryCleaner.gl American Heart Association (AHA): heart.org Academy of Nutrition and Dietetics: eatright.org National Kidney Foundation (NKF): kidney.org This information is not intended to replace advice given to you by your health care provider. Make sure you discuss any questions you have with your health care provider. Document Revised: 09/28/2022 Document Reviewed: 09/28/2022 Elsevier Patient Education  2024 [employer].

## 2023-06-25 NOTE — Progress Notes (Signed)
Subjective: CC:DM PCP: Raliegh Ip, DO ZOX:WRUEAV Thomas Ball is a 62 y.o. male presenting to clinic today for:  1. Type 2 Diabetes with hypertension, hyperlipidemia:  Reports compliance with all meds.  Notes he felt hypoglycemic at BG 84.  No true lows.  Avg BGs~ 150s.  Had a lung cancer screening test done recently which showed some coronary artery calcifications.  He plans on reaching out to his cardiologist about this.  Sees Dr. Ladona Ridgel and his wife who is also a cardiologist.  He reports no chest pain, shortness of breath.  He does report some pedal edema and wants to know how to help this.  He admits that he eats a lot of frozen meals and prepackaged meals.  Diabetes Health Maintenance Due  Topic Date Due   OPHTHALMOLOGY EXAM  08/15/2023   HEMOGLOBIN A1C  09/12/2023   FOOT EXAM  03/12/2024    Last A1c:  Lab Results  Component Value Date   HGBA1C 6.3 (H) 03/13/2023   2.  Erectile dysfunction He reports to me some erectile dysfunction.  He is able to achieve an erection but not able to maintain or climax.  He is in a romantic relationship and would like to trial some Viagra to see if this might help.  ROS: Per HPI  No Known Allergies Past Medical History:  Diagnosis Date   GERD (gastroesophageal reflux disease)    HCV (hepatitis C virus) DR. ZACKS   AUG 2013 VIRAL LOAD UNDETECTABLE   History of cardiac catheterization    a. 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   Sleep apnea    SVT (supraventricular tachycardia)     Current Outpatient Medications:    albuterol (PROVENTIL) (2.5 MG/3ML) 0.083% nebulizer solution, Take 3 mLs (2.5 mg total) by nebulization every 6 (six) hours as needed for wheezing or shortness of breath., Disp: 150 mL, Rfl: 1   albuterol (VENTOLIN HFA) 108 (90 Base) MCG/ACT inhaler, Inhale 2 puffs into the lungs every 6  (six) hours as needed for wheezing or shortness of breath., Disp: 8 g, Rfl: 2   atorvastatin (LIPITOR) 40 MG tablet, TAKE 1 TABLET DAILY AT 6PM NEEDS TO BE SEEN, Disp: 30 tablet, Rfl: 5   doxazosin (CARDURA) 1 MG tablet, Take 1 tablet (1 mg total) by mouth daily., Disp: 90 tablet, Rfl: 1   flecainide (TAMBOCOR) 50 MG tablet, Take 1 tablet (50 mg total) by mouth 2 (two) times daily., Disp: , Rfl:    fluticasone (FLONASE) 50 MCG/ACT nasal spray, Place 2 sprays into both nostrils daily., Disp: 16 g, Rfl: 0   metFORMIN (GLUCOPHAGE) 500 MG tablet, TAKE ONE TABLET BY MOUTH TWICE DAILY, Disp: 180 tablet, Rfl: 0   metoprolol succinate (TOPROL XL) 25 MG 24 hr tablet, Take 1 tablet (25 mg total) by mouth daily., Disp: 90 tablet, Rfl: 3   nystatin powder, Apply 1 Application topically 3 (three) times daily. X14 per flareup, Disp: 60 g, Rfl: 0   omeprazole (PRILOSEC) 40 MG capsule, TAKE ONE (1) CAPSULE EACH DAY, Disp: 90 capsule, Rfl: 1   Tiotropium Bromide-Olodaterol (STIOLTO RESPIMAT) 2.5-2.5 MCG/ACT AERS, Inhale 2 puffs into the lungs daily., Disp: 1 each, Rfl: 5 Social History   Socioeconomic History   Marital status: Widowed    Spouse name: Not on file   Number of children:  Not on file   Years of education: Not on file   Highest education level: Not on file  Occupational History   Occupation: concrete work    Associate Professor: SELF-EMPLOYED  Tobacco Use   Smoking status: Every Day    Current packs/day: 2.00    Average packs/day: 2.0 packs/day for 45.0 years (90.0 ttl pk-yrs)    Types: Cigarettes   Smokeless tobacco: Never   Tobacco comments:    smokes 1-1.5 packs per day  Vaping Use   Vaping status: Never Used  Substance and Sexual Activity   Alcohol use: No   Drug use: No    Comment: in the 1980s, smoke crack. None since Dec 2011.    Sexual activity: Not Currently  Other Topics Concern   Not on file  Social History Narrative   Not on file   Social Determinants of Health   Financial  Resource Strain: Not on file  Food Insecurity: Not on file  Transportation Needs: Not on file  Physical Activity: Not on file  Stress: Not on file  Social Connections: Not on file  Intimate Partner Violence: Not on file   Family History  Problem Relation Age of Onset   Emphysema Mother    COPD Mother    Early death Mother    Hyperlipidemia Father    Stroke Father    Diabetes Father    High Cholesterol Father    Colon cancer Neg Hx     Objective: Office vital signs reviewed. BP 109/64   Pulse 85   Temp 98.5 F (36.9 C)   Ht 5\' 5"  (1.651 m)   Wt (!) 315 lb (142.9 kg)   SpO2 91%   BMI 52.42 kg/m   Physical Examination:  General: Awake, alert, super morbidly obese, No acute distress HEENT: sclera white, MMM Cardio: regular rate and rhythm  Pulm:  Normal work of breathing on room air MSK: ambulating independently Extremities: warm, well perfused, Nonpitting edema of legs. No erythema or skin breakdown.  Assessment/ Plan: 62 y.o. male   Controlled type 2 diabetes mellitus with other specified complication, without long-term current use of insulin (HCC) - Plan: Bayer DCA Hb A1c Waived, metFORMIN (GLUCOPHAGE) 500 MG tablet  Long term (current) use of oral hypoglycemic drugs  Hyperlipidemia associated with type 2 diabetes mellitus (HCC) - Plan: CANCELED: Lipid Panel  Hypertension associated with diabetes (HCC)  Encounter for immunization - Plan: Flu vaccine trivalent PF, 6mos and older(Flulaval,Afluria,Fluarix,Fluzone)  Venous stasis  Vasculogenic erectile dysfunction, unspecified vasculogenic erectile dysfunction type - Plan: sildenafil (VIAGRA) 100 MG tablet   She remains under excellent control in fact we could consider backing down on the metformin.  I would like to see how he does throughout the holidays first however.  Continue monitoring blood sugars closely.  Had fasting lipid and kidney function checked earlier this year.  No repeat needed.  Continue  current regimen  Influenza vaccination administered  Discussed salt limitation, compression hose and elevation.  Handout provided for venous stasis.  Given borderline blood pressure I hesitate to add oral diuretic at this time  Trial of Viagra.  Discussed risk versus benefits and red flag signs and symptoms warranting further evaluation in the emergency department.  He voiced good understanding will follow-up as needed this issue  Raliegh Ip, DO [facility] [phone]

## 2023-06-26 ENCOUNTER — Telehealth: Payer: Self-pay | Admitting: Internal Medicine

## 2023-06-26 NOTE — Telephone Encounter (Signed)
Patient states he had a lung screening and has blockages that may interfere with his heart. He would like a call back to discuss.

## 2023-06-26 NOTE — Telephone Encounter (Signed)
Patient reports that he was told there were cardiac concerns on his CT chest lung screening. Report is posted in his chart. Will send to Dr. Tenny Craw for review and advisement on follow up if needed.

## 2023-06-30 DIAGNOSIS — R001 Bradycardia, unspecified: Secondary | ICD-10-CM

## 2023-06-30 DIAGNOSIS — I471 Supraventricular tachycardia, unspecified: Secondary | ICD-10-CM

## 2023-06-30 DIAGNOSIS — I491 Atrial premature depolarization: Secondary | ICD-10-CM

## 2023-07-02 ENCOUNTER — Telehealth: Payer: Self-pay | Admitting: Internal Medicine

## 2023-07-02 NOTE — Telephone Encounter (Signed)
Patient states he received a heart monitor but no one told him anything about it. He states he wore it all weekend and it is starting to fall off so he plans to send it back today. He would like a call back to discuss.

## 2023-07-02 NOTE — Telephone Encounter (Signed)
Patient received My Chart message from Jeannett Senior in August to wear a Zio patch monitor in October for 3 days.  Patient stated he applied monitor Friday.  Advised him to remove and mail monitor back to [facility] for processing.  Patient has not heard back yet about the cardiac concerns on his CT Chest lung screening.  He has had significant leg and ankle swelling and is wondering if this could be related.

## 2023-07-03 NOTE — Telephone Encounter (Signed)
I reviewed CT   He has some mild cholesterol plaquing on arteries. His lipids are well controlled    I would keep on same meds  We have reviewed diet   Cut out ultraprocessed fast foods   REcomm minimally processed whole food instead  Is e wearing monitor?

## 2023-07-03 NOTE — Telephone Encounter (Signed)
Pt advised and he just sent his monitor back in yesterday and will follow up with him after the results are back.   We went over a heart healthy diet.

## 2023-07-06 ENCOUNTER — Other Ambulatory Visit: Payer: Self-pay | Admitting: Family Medicine

## 2023-07-19 ENCOUNTER — Telehealth: Payer: Self-pay | Admitting: Internal Medicine

## 2023-07-19 MED ORDER — METOPROLOL SUCCINATE ER 25 MG PO TB24: 25.0000 mg | ORAL_TABLET | Freq: Two times a day (BID) | ORAL | 3 refills | Status: DC

## 2023-07-19 NOTE — Telephone Encounter (Signed)
Thomas Simpler, MD 07/16/2023  1:28 PM EDT     Patient's monitor shows frequent PACs   Could try increasing Toprol XL to 25 2x per day Follow BP and HR     The patient has been notified of the result and verbalized understanding.  All questions (if any) were answered. Frutoso Schatz, RN 07/19/2023 8:32 AM

## 2023-07-19 NOTE — Telephone Encounter (Signed)
Pt was returning nurse Ann's call regarding results and pt is requesting a callback from the first nurse available to speak with him about his results being that he doesn't use MyChart. Please advise

## 2023-08-07 ENCOUNTER — Telehealth: Payer: Self-pay | Admitting: Adult Health

## 2023-08-07 NOTE — Telephone Encounter (Signed)
Tiotropium Bromide-Olodaterol (STIOLTO RESPIMAT) 2.5-2.5 MCG/ACT AERS    Patient is requesting samples

## 2023-08-07 NOTE — Telephone Encounter (Signed)
We do not have samples of Stiolto in [place]. Patient has been advised.   MARKET - please see if there are any samples available there. Also, patient may qualify for patient assistance as he does not have insurance.  Thanks!

## 2023-08-08 NOTE — Telephone Encounter (Signed)
Left message on VM to let patient know we have samples of Stiolto in [facility].  Ask patient if he wants to come to [facility] clinic to pick up samples.  Have NOT put any at front desk yet.

## 2023-08-09 ENCOUNTER — Ambulatory Visit: Payer: Self-pay | Admitting: Family Medicine

## 2023-08-09 ENCOUNTER — Ambulatory Visit (INDEPENDENT_AMBULATORY_CARE_PROVIDER_SITE_OTHER): Payer: Self-pay | Admitting: Family Medicine

## 2023-08-09 ENCOUNTER — Encounter: Payer: Self-pay | Admitting: Family Medicine

## 2023-08-09 ENCOUNTER — Other Ambulatory Visit: Payer: Self-pay | Admitting: Family Medicine

## 2023-08-09 VITALS — BP 121/72 | HR 77 | Temp 98.0°F | Ht 65.0 in | Wt 323.0 lb

## 2023-08-09 DIAGNOSIS — J209 Acute bronchitis, unspecified: Secondary | ICD-10-CM

## 2023-08-09 DIAGNOSIS — I499 Cardiac arrhythmia, unspecified: Secondary | ICD-10-CM

## 2023-08-09 DIAGNOSIS — J441 Chronic obstructive pulmonary disease with (acute) exacerbation: Secondary | ICD-10-CM

## 2023-08-09 MED ORDER — ALBUTEROL SULFATE HFA 108 (90 BASE) MCG/ACT IN AERS
2.0000 | INHALATION_SPRAY | Freq: Four times a day (QID) | RESPIRATORY_TRACT | 2 refills | Status: AC | PRN
Start: 2023-08-09 — End: ?

## 2023-08-09 MED ORDER — BREZTRI AEROSPHERE 160-9-4.8 MCG/ACT IN AERO
2.0000 | INHALATION_SPRAY | Freq: Two times a day (BID) | RESPIRATORY_TRACT | 11 refills | Status: DC
Start: 2023-08-09 — End: 2023-12-11

## 2023-08-09 MED ORDER — PREDNISONE 20 MG PO TABS
40.0000 mg | ORAL_TABLET | Freq: Every day | ORAL | 0 refills | Status: AC
Start: 2023-08-09 — End: 2023-08-14

## 2023-08-09 MED ORDER — AZITHROMYCIN 250 MG PO TABS
ORAL_TABLET | ORAL | 0 refills | Status: AC
Start: 2023-08-09 — End: 2023-08-14

## 2023-08-09 NOTE — Progress Notes (Signed)
Subjective:  Patient ID: Thomas Ball, male    DOB: [birth year], 62 y.o.   MRN: [id number]  Patient Care Team: Raliegh Ip, DO as PCP - General (Family Medicine) Pricilla Riffle, MD as PCP - Cardiology (Cardiology) Marinus Maw, MD as PCP - Electrophysiology (Cardiology) Michaelle Copas, MD as Referring Physician (Optometry)   Chief Complaint:  cough, congestion, SOB (X 3 weeks)  HPI: Thomas Ball is a 62 y.o. male presenting on 08/09/2023 for cough, congestion, SOB (X 3 weeks) States that he is having a hard time breathing. States that this started one week ago. Reports cough and congestion that started 2 weeks ago. Denies sore throat, ear pain, sinus pain, fevers. Productive cough, states that it started out brown and is now yellow.  Two home tests for covid negative. He is out of his rescue inhaler. He is self pay and is about to run out of stiolto.   Relevant past medical, surgical, family, and social history reviewed and updated as indicated.  Allergies and medications reviewed and updated. Data reviewed: Chart in Epic.   Past Medical History:  Diagnosis Date   GERD (gastroesophageal reflux disease)    HCV (hepatitis C virus) DR. ZACKS   AUG 2013 VIRAL LOAD UNDETECTABLE   History of cardiac catheterization    a. 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   CHOLECYSTECTOMY     COLONOSCOPY  10/04/2012   SLF:ONE/8 SIMPLE ADENOMA, TICS, SML IH   ESOPHAGOGASTRODUODENOSCOPY N/A 09/04/2014   Procedure: ESOPHAGOGASTRODUODENOSCOPY (EGD);  Surgeon: West Bali, MD;  Location: [facility];  Service: Endoscopy;  Laterality: N/A;  145   GALLBLADDER SURGERY     JAW BROKEN      LIPOMA REMOVALS     SEVERAL   RIGHT/LEFT HEART CATH AND CORONARY ANGIOGRAPHY N/A 10/08/2018   Procedure: RIGHT/LEFT HEART CATH AND CORONARY ANGIOGRAPHY;  Surgeon: Swaziland, Peter M, MD;  Location: [facility] INVASIVE CV LAB;  Service: Cardiovascular;  Laterality: N/A;   SUPRAVENTRICULAR TACHYCARDIA ABLATION N/A 06/22/2014   Procedure: SUPRAVENTRICULAR TACHYCARDIA ABLATION;  Surgeon: Marinus Maw, MD;  Location: [facility] CATH LAB;  Service: Cardiovascular;  Laterality: N/A;    Social History   Socioeconomic History   Marital status: Widowed    Spouse name: Not on file   Number of children: Not on file   Years of education: Not on file   Highest education level: Not on file  Occupational History   Occupation: concrete work    Associate Professor: SELF-EMPLOYED  Tobacco Use   Smoking status: Every Day    Current packs/day: 2.00    Average packs/day: 2.0 packs/day for 45.0 years (90.0 ttl pk-yrs)    Types: Cigarettes   Smokeless tobacco: Never   Tobacco comments:    smokes 1-1.5 packs per day  Vaping Use   Vaping status: Never Used  Substance and Sexual Activity   Alcohol use: No   Drug use: No    Comment: in the 1980s, smoke crack. None since Dec 2011.    Sexual activity: Not Currently  Other Topics Concern   Not on file  Social History Narrative   Not on file   Social Determinants of Health   Financial Resource Strain: Not on file  Food Insecurity: Not on file  Transportation Needs: Not on file  Physical Activity: Not on file  Stress: Not on file  Social Connections: Not on file  Intimate Partner Violence: Not on file    Outpatient Encounter Medications as of 08/09/2023  Medication Sig   albuterol (PROVENTIL) (2.5 MG/3ML) 0.083% nebulizer solution Take 3 mLs (2.5 mg total) by nebulization every 6 (six) hours as needed for wheezing or shortness of breath.   albuterol (VENTOLIN HFA) 108 (90 Base) MCG/ACT inhaler Inhale 2 puffs into the lungs every 6 (six) hours as needed for wheezing or  shortness of breath.   atorvastatin (LIPITOR) 40 MG tablet Take 1 tablet (40 mg total) by mouth daily.   doxazosin (CARDURA) 1 MG tablet Take 1 tablet (1 mg total) by mouth daily.   doxazosin (CARDURA) 2 MG tablet TAKE 1/2 TABLET DAILY   flecainide (TAMBOCOR) 50 MG tablet Take 1 tablet (50 mg total) by mouth 2 (two) times daily.   fluticasone (FLONASE) 50 MCG/ACT nasal spray Place 2 sprays into both nostrils daily.   metFORMIN (GLUCOPHAGE) 500 MG tablet Take 1 tablet (500 mg total) by mouth 2 (two) times daily.   metoprolol succinate (TOPROL XL) 25 MG 24 hr tablet Take 1 tablet (25 mg total) by mouth in the morning and at bedtime.   nystatin powder Apply 1 Application topically 3 (three) times daily. X14 per flareup   omeprazole (PRILOSEC) 40 MG capsule TAKE ONE (1) CAPSULE EACH DAY   sildenafil (VIAGRA) 100 MG tablet Take 0.5-1 tablets (50-100 mg total) by mouth daily as needed for erectile dysfunction.   Tiotropium Bromide-Olodaterol (STIOLTO RESPIMAT) 2.5-2.5 MCG/ACT AERS Inhale 2 puffs into the lungs daily.   No facility-administered encounter medications on file as of 08/09/2023.    No Known Allergies  Review of Systems As per HPI  Objective:  BP 136/82   Pulse 77   Temp 98 F (36.7 C)   Ht 5\' 5"  (1.651 m)   Wt (!) 323 lb (146.5 kg)   SpO2 98%   BMI 53.75 kg/m    Wt Readings from Last 3 Encounters:  08/09/23 (!) 323 lb (146.5 kg)  06/25/23 (!) 315 lb (142.9 kg)  05/07/23 (!) 312 lb 9.6 oz (141.8 kg)   Physical Exam Constitutional:      General: He is awake. He is not in acute distress.    Appearance: Normal appearance. He is well-developed and well-groomed. He is morbidly obese. He is ill-appearing. He is not toxic-appearing or diaphoretic.  Cardiovascular:     Rate and Rhythm: Normal rate and regular rhythm. Frequent Extrasystoles are present.    Pulses: Normal pulses.          Radial pulses are 2+ on the right side and 2+ on the left side.       Posterior tibial  pulses are 2+ on the right side and 2+ on the left side.     Heart sounds: Normal heart sounds. No murmur heard.    No gallop.  Pulmonary:     Effort: Pulmonary effort is normal. No respiratory distress.     Breath sounds: Decreased air movement present. No stridor. Examination of the left-middle field reveals decreased breath sounds. Examination of the right-lower field reveals decreased breath sounds. Examination of the left-lower field reveals decreased breath sounds. Decreased breath sounds present. No wheezing, rhonchi  or rales.     Comments: Course lung sounds bilateral mid and lower bases  Musculoskeletal:     Cervical back: Full passive range of motion without pain and neck supple.     Right lower leg: No edema.     Left lower leg: No edema.  Skin:    General: Skin is warm.     Capillary Refill: Capillary refill takes less than 2 seconds.  Neurological:     General: No focal deficit present.     Mental Status: He is alert, oriented to person, place, and time and easily aroused. Mental status is at baseline.     GCS: GCS eye subscore is 4. GCS verbal subscore is 5. GCS motor subscore is 6.     Motor: No weakness.  Psychiatric:        Attention and Perception: Attention and perception normal.        Mood and Affect: Mood and affect normal.        Speech: Speech normal.        Behavior: Behavior normal. Behavior is cooperative.        Thought Content: Thought content normal. Thought content does not include homicidal or suicidal ideation. Thought content does not include homicidal or suicidal plan.        Cognition and Memory: Cognition and memory normal.        Judgment: Judgment normal.     Results for orders placed or performed in visit on 06/25/23  Bayer DCA Hb A1c Waived  Result Value Ref Range   HB A1C (BAYER DCA - WAIVED) 5.6 4.8 - 5.6 %       06/25/2023    3:01 PM 04/05/2023    3:42 PM 03/13/2023    8:32 AM 03/09/2023    9:22 AM 01/24/2023    9:51 AM  Depression  screen PHQ 2/9  Decreased Interest 0 0 0 0 0  Down, Depressed, Hopeless 0 0 0 0 0  PHQ - 2 Score 0 0 0 0 0  Altered sleeping 0 0 0 0 0  Tired, decreased energy 0 0 0 0 0  Change in appetite 0 0 0 0 0  Feeling bad or failure about yourself  0 0 0 0 0  Trouble concentrating 0 0 0 0 0  Moving slowly or fidgety/restless 0 0 0 0 0  Suicidal thoughts 0 0 0 0 0  PHQ-9 Score 0 0 0 0 0  Difficult doing work/chores Not difficult at all Not difficult at all Not difficult at all Not difficult at all Not difficult at all       06/25/2023    3:01 PM 04/05/2023    3:43 PM 03/13/2023    8:32 AM 03/09/2023    9:22 AM  GAD 7 : Generalized Anxiety Score  Nervous, Anxious, on Edge 0 0 0 0  Control/stop worrying 0 0 0 0  Worry too much - different things 0 0 0 0  Trouble relaxing 0 0 0 0  Restless 0 0 0 0  Easily annoyed or irritable 0 0 0 0  Afraid - awful might happen 0 0 0 0  Total GAD 7 Score 0 0 0 0  Anxiety Difficulty Not difficult at all Not difficult at all Not difficult at all Not difficult at all   Pertinent labs & imaging results that were available during my care of the patient were reviewed by me and considered in my medical decision making.  Assessment & Plan:  Thomas Ball was seen today for cough, congestion, sob.  Diagnoses and all orders for this visit:  COPD exacerbation (HCC) Will start medications as below. If patient does not improve will continue evaluation with additional imaging. Patient to follow up with pulmonology. Established with Craige Cotta, MD -     albuterol (VENTOLIN HFA) 108 (90 Base) MCG/ACT inhaler; Inhale 2 puffs into the lungs every 6 (six) hours as needed for wheezing or shortness of breath. -     predniSONE (DELTASONE) 20 MG tablet; Take 2 tablets (40 mg total) by mouth daily with breakfast for 5 days. -     azithromycin (ZITHROMAX) 250 MG tablet; Take 2 tablets on day 1, then 1 tablet daily on days 2 through 5 -     Budeson-Glycopyrrol-Formoterol (BREZTRI AEROSPHERE)  160-9-4.8 MCG/ACT AERO; Inhale 2 puffs into the lungs 2 (two) times daily.  Bronchitis with bronchospasm Refill provided as below.  -     albuterol (VENTOLIN HFA) 108 (90 Base) MCG/ACT inhaler; Inhale 2 puffs into the lungs every 6 (six) hours as needed for wheezing or shortness of breath.  Irregular heart beat Patient is established with Cardiology and takes multiple medications for rhythm and rate control. Labs as below. Will communicate results to patient once available. Will await results to determine next steps.  Patient aware lab is billed separately.  Reviewed labs and patient was slightly anemic, which can contribute to ectopy.  Recommend patient follow up with cardiology.  -     Anemia Profile B -     CMP14+EGFR -     TSH -     T4, Free  Continue all other maintenance medications.  Follow up plan: Return if symptoms worsen or fail to improve.   Continue healthy lifestyle choices, including diet (rich in fruits, vegetables, and lean proteins, and low in salt and simple carbohydrates) and exercise (at least 30 minutes of moderate physical activity daily).  Written and verbal instructions provided   The above assessment and management plan was discussed with the patient. The patient verbalized understanding of and has agreed to the management plan. Patient is aware to call the clinic if they develop any new symptoms or if symptoms persist or worsen. Patient is aware when to return to the clinic for a follow-up visit. Patient educated on when it is appropriate to go to the emergency department.   Neale Burly, DNP-FNP [facility] [address] [place], [place] [address] [phone]

## 2023-08-09 NOTE — Telephone Encounter (Signed)
Chief Complaint: shortness of breath Symptoms: shortness of breath, occasional wheezing, cough, congestion Frequency: constant x 1 week Pertinent Negatives: Patient denies fever, chest pain, hypoxia Disposition: [] ED /[] Urgent Care (no appt availability in office) / [x] Appointment(In office/virtual)/ []  [facility]/ [] Home Care/ [] Refused Recommended Disposition /[] [facility] [place] [facility]/ []  Follow-up with PCP Additional Notes: Patient reports that he has been having shortness of breath for the past week. Patient reports he usually gets bronchitis this time of year and assumed his symptoms were related to that, but is now concerned because after a week he is not feeling better. Patient also reports coughing up yellow mucus, congestion, and occasional wheezing. Patient reports he monitors his oxygen and HR at home and they have all been within his normal during this time. Oxygen reading currently 95%. Patient scheduled for appointment today 11/14. Patient verbalized understanding.  Copied from CRM [id number]. Topic: Clinical - Red Word Triage >> Aug 09, 2023  2:03 PM Desma Mcgregor wrote: Red Word that prompted transfer to Nurse Triage: Shortness of breath, has bronchitis. Gets worse when actively moving around. Reason for Disposition  [1] MILD difficulty breathing (e.g., minimal/no SOB at rest, SOB with walking, pulse <100) AND [2] NEW-onset or WORSE than normal  Answer Assessment - Initial Assessment Questions 1. RESPIRATORY STATUS: "Describe your breathing?" (e.g., wheezing, shortness of breath, unable to speak, severe coughing)      Shortness of breath, cough, wheezing after a cough 2. ONSET: "When did this breathing problem begin?"      1 week ago 3. PATTERN "Does the difficult breathing come and go, or has it been constant since it started?"      It's constant and it's stayed the same, but it does get worse with movement 4. SEVERITY: "How bad is your breathing?" (e.g., mild, moderate,  severe)    - MILD: No SOB at rest, mild SOB with walking, speaks normally in sentences, can lie down, no retractions, pulse < 100.    - MODERATE: SOB at rest, SOB with minimal exertion and prefers to sit, cannot lie down flat, speaks in phrases, mild retractions, audible wheezing, pulse 100-120.    - SEVERE: Very SOB at rest, speaks in single words, struggling to breathe, sitting hunched forward, retractions, pulse > 120      moderate 5. RECURRENT SYMPTOM: "Have you had difficulty breathing before?" If Yes, ask: "When was the last time?" and "What happened that time?"      Usually this happens when I get bronchitis but this is the worst I've ever gotten it and it hasn't gone away 6. CARDIAC HISTORY: "Do you have any history of heart disease?" (e.g., heart attack, angina, bypass surgery, angioplasty)      My heart rhythm isn't right so I take medications for that 7. LUNG HISTORY: "Do you have any history of lung disease?"  (e.g., pulmonary embolus, asthma, emphysema)     COPD, OSA 8. CAUSE: "What do you think is causing the breathing problem?"      I think it's related to my bronchitis  9. OTHER SYMPTOMS: "Do you have any other symptoms? (e.g., dizziness, runny nose, cough, chest pain, fever)     Cough, congestion, yellow mucus 10. O2 SATURATION MONITOR:  "Do you use an oxygen saturation monitor (pulse oximeter) at home?" If Yes, ask: "What is your reading (oxygen level) today?" "What is your usual oxygen saturation reading?" (e.g., 95%)        I monitor it regularly, right now it is  95% and my HR is 54 which is normal for me  Protocols used: Breathing Difficulty-A-AH

## 2023-08-10 LAB — CMP14+EGFR
ALT: 19 [IU]/L (ref 0–44)
AST: 18 [IU]/L (ref 0–40)
Albumin: 3.9 g/dL (ref 3.8–4.9)
Alkaline Phosphatase: 28 [IU]/L — ABNORMAL LOW (ref 44–121)
BUN/Creatinine Ratio: 23 (ref 10–24)
BUN: 17 mg/dL (ref 8–27)
Bilirubin Total: 0.8 mg/dL (ref 0.0–1.2)
CO2: 25 mmol/L (ref 20–29)
Calcium: 8.7 mg/dL (ref 8.6–10.2)
Chloride: 102 mmol/L (ref 96–106)
Creatinine, Ser: 0.75 mg/dL — ABNORMAL LOW (ref 0.76–1.27)
Globulin, Total: 2.3 g/dL (ref 1.5–4.5)
Glucose: 135 mg/dL — ABNORMAL HIGH (ref 70–99)
Potassium: 4.6 mmol/L (ref 3.5–5.2)
Sodium: 139 mmol/L (ref 134–144)
Total Protein: 6.2 g/dL (ref 6.0–8.5)
eGFR: 103 mL/min/{1.73_m2} (ref >59)

## 2023-08-10 LAB — ANEMIA PROFILE B
Basophils Absolute: 0.1 10*3/uL (ref 0.0–0.2)
Basos: 1 %
EOS (ABSOLUTE): 0.1 10*3/uL (ref 0.0–0.4)
Eos: 2 %
Ferritin: 108 ng/mL (ref 30–400)
Folate: 15.5 ng/mL (ref >3.0)
Hematocrit: 37.9 % (ref 37.5–51.0)
Hemoglobin: 12.3 g/dL — ABNORMAL LOW (ref 13.0–17.7)
Immature Grans (Abs): 0 10*3/uL (ref 0.0–0.1)
Immature Granulocytes: 0 %
Iron Saturation: 15 % (ref 15–55)
Iron: 39 ug/dL (ref 38–169)
Lymphocytes Absolute: 1.1 10*3/uL (ref 0.7–3.1)
Lymphs: 19 %
MCH: 31.7 pg (ref 26.6–33.0)
MCHC: 32.5 g/dL (ref 31.5–35.7)
MCV: 98 fL — ABNORMAL HIGH (ref 79–97)
Monocytes Absolute: 0.4 10*3/uL (ref 0.1–0.9)
Monocytes: 7 %
Neutrophils Absolute: 4.1 10*3/uL (ref 1.4–7.0)
Neutrophils: 71 %
Platelets: 125 10*3/uL — ABNORMAL LOW (ref 150–450)
RBC: 3.88 x10E6/uL — ABNORMAL LOW (ref 4.14–5.80)
RDW: 12.4 % (ref 11.6–15.4)
Retic Ct Pct: 1.3 % (ref 0.6–2.6)
Total Iron Binding Capacity: 261 ug/dL (ref 250–450)
UIBC: 222 ug/dL (ref 111–343)
Vitamin B-12: 313 pg/mL (ref 232–1245)
WBC: 5.8 10*3/uL (ref 3.4–10.8)

## 2023-08-10 LAB — TSH: TSH: 2.1 u[IU]/mL (ref 0.450–4.500)

## 2023-08-10 LAB — T4, FREE: Free T4: 1.32 ng/dL (ref 0.82–1.77)

## 2023-08-10 NOTE — Progress Notes (Signed)
Labs stable. Follow up with PCP.

## 2023-08-13 ENCOUNTER — Telehealth: Payer: Self-pay

## 2023-08-13 DIAGNOSIS — I491 Atrial premature depolarization: Secondary | ICD-10-CM

## 2023-08-13 DIAGNOSIS — R001 Bradycardia, unspecified: Secondary | ICD-10-CM

## 2023-08-13 DIAGNOSIS — I471 Supraventricular tachycardia, unspecified: Secondary | ICD-10-CM

## 2023-08-13 NOTE — Telephone Encounter (Signed)
Per Dr Tenny Craw:   Please set pt up for follow up monitor after the new year (3 day)

## 2023-08-13 NOTE — Telephone Encounter (Signed)
-----   Message from Dietrich Pates sent at 08/12/2023  9:38 PM EST ----- Please set pt up for follow up monitor after the new year (3 day) ----- Message ----- From: Arrie Senate, FNP Sent: 08/09/2023   4:21 PM EST To: Pricilla Riffle, MD  Hi Dr. Tenny Craw, I just wanted to share this chart. I saw a mutual patient of ours today and he remains to have a lot of ectopy on exam. He remains asymptomatic.  Thanks!  Jerrel Ivory

## 2023-08-14 ENCOUNTER — Ambulatory Visit: Payer: Self-pay | Attending: Internal Medicine

## 2023-08-14 DIAGNOSIS — R001 Bradycardia, unspecified: Secondary | ICD-10-CM

## 2023-08-14 DIAGNOSIS — I471 Supraventricular tachycardia, unspecified: Secondary | ICD-10-CM

## 2023-08-14 DIAGNOSIS — I491 Atrial premature depolarization: Secondary | ICD-10-CM

## 2023-08-14 NOTE — Telephone Encounter (Signed)
Left detailed message per signed DPR. Encouraged call back if there are any questions.

## 2023-08-14 NOTE — Telephone Encounter (Signed)
I spoke with the pt and advised him about the 3 day monitor for jan 2025.   Pt sounded very short of breath on the phone: he says he is being treated for bronchitis.. he saw his PCP 11/14 and they made some med changes:   Will start medications as below. If patient does not improve will continue evaluation with additional imaging. Patient to follow up with pulmonology. Established with Craige Cotta, MD -     albuterol (VENTOLIN HFA) 108 (90 Base) MCG/ACT inhaler; Inhale 2 puffs into the lungs every 6 (six) hours as needed for wheezing or shortness of breath. -     predniSONE (DELTASONE) 20 MG tablet; Take 2 tablets (40 mg total) by mouth daily with breakfast for 5 days. -     azithromycin (ZITHROMAX) 250 MG tablet; Take 2 tablets on day 1, then 1 tablet daily on days 2 through 5 -     Budeson-Glycopyrrol-Formoterol (BREZTRI AEROSPHERE) 160-9-4.8 MCG/ACT AERO; Inhale 2 puffs into the lungs 2 (two) times daily.  Pt says he is not much better and getting very uncomfortable... he denies a productive cough and no pain with breathing.   I will forward to his PCP and Pulmonary for review and recommendations.   Today was the last day of his steroids. Pt has video visit with Pulmonary 08/17/23.

## 2023-08-14 NOTE — Progress Notes (Unsigned)
Enrolled for [employer] to mail a ZIO XT long term holter monitor to the patients address on file. Requested monitor be shipped after 09/26/2023.

## 2023-08-16 ENCOUNTER — Encounter (HOSPITAL_COMMUNITY): Payer: Self-pay

## 2023-08-16 ENCOUNTER — Other Ambulatory Visit: Payer: Self-pay

## 2023-08-16 ENCOUNTER — Emergency Department (HOSPITAL_COMMUNITY): Payer: Self-pay

## 2023-08-16 ENCOUNTER — Emergency Department (HOSPITAL_COMMUNITY)
Admission: EM | Admit: 2023-08-16 | Discharge: 2023-08-16 | Disposition: A | Discharge status: Home / Self Care | Payer: Self-pay | Attending: Emergency Medicine | Admitting: Emergency Medicine

## 2023-08-16 DIAGNOSIS — I509 Heart failure, unspecified: Secondary | ICD-10-CM | POA: Insufficient documentation

## 2023-08-16 DIAGNOSIS — R0602 Shortness of breath: Secondary | ICD-10-CM

## 2023-08-16 DIAGNOSIS — I110 Hypertensive heart disease with heart failure: Secondary | ICD-10-CM | POA: Insufficient documentation

## 2023-08-16 DIAGNOSIS — R600 Localized edema: Secondary | ICD-10-CM | POA: Insufficient documentation

## 2023-08-16 DIAGNOSIS — Z79899 Other long term (current) drug therapy: Secondary | ICD-10-CM | POA: Insufficient documentation

## 2023-08-16 LAB — CBC WITH DIFFERENTIAL/PLATELET
Abs Immature Granulocytes: 0.03 10*3/uL (ref 0.00–0.07)
Basophils Absolute: 0.1 10*3/uL (ref 0.0–0.1)
Basophils Relative: 1 %
Eosinophils Absolute: 0.1 10*3/uL (ref 0.0–0.5)
Eosinophils Relative: 1 %
HCT: 40.9 % (ref 39.0–52.0)
Hemoglobin: 13 g/dL (ref 13.0–17.0)
Immature Granulocytes: 0 %
Lymphocytes Relative: 17 %
Lymphs Abs: 1.3 10*3/uL (ref 0.7–4.0)
MCH: 30.7 pg (ref 26.0–34.0)
MCHC: 31.8 g/dL (ref 30.0–36.0)
MCV: 96.5 fL (ref 80.0–100.0)
Monocytes Absolute: 0.6 10*3/uL (ref 0.1–1.0)
Monocytes Relative: 8 %
Neutro Abs: 5.6 10*3/uL (ref 1.7–7.7)
Neutrophils Relative %: 73 %
Platelets: 133 10*3/uL — ABNORMAL LOW (ref 150–400)
RBC: 4.24 MIL/uL (ref 4.22–5.81)
RDW: 13.4 % (ref 11.5–15.5)
WBC: 7.6 10*3/uL (ref 4.0–10.5)
nRBC: 0 % (ref 0.0–0.2)

## 2023-08-16 LAB — BRAIN NATRIURETIC PEPTIDE: B Natriuretic Peptide: 213 pg/mL — ABNORMAL HIGH (ref 0.0–100.0)

## 2023-08-16 LAB — BASIC METABOLIC PANEL
Anion gap: 8 (ref 5–15)
BUN: 20 mg/dL (ref 6–20)
CO2: 30 mmol/L (ref 22–32)
Calcium: 8.7 mg/dL — ABNORMAL LOW (ref 8.9–10.3)
Chloride: 96 mmol/L — ABNORMAL LOW (ref 98–111)
Creatinine, Ser: 0.78 mg/dL (ref 0.61–1.24)
GFR, Estimated: >60 mL/min (ref >60)
Glucose, Bld: 132 mg/dL — ABNORMAL HIGH (ref 70–99)
Potassium: 4.3 mmol/L (ref 3.5–5.1)
Sodium: 134 mmol/L — ABNORMAL LOW (ref 135–145)

## 2023-08-16 MED ORDER — FUROSEMIDE 20 MG PO TABS: 20.0000 mg | ORAL_TABLET | Freq: Every day | ORAL | 0 refills | Status: DC

## 2023-08-16 NOTE — Discharge Instructions (Signed)
You were seen for your shortness of breath in the emergency department. You were found to have extra fluid on your legs which may be related to your symptoms.   At home, please take the lasix for the next 4 days. Try not to drink more than 2L of fluids per day.    Check your MyChart online for the results of any tests that had not resulted by the time you left the emergency department.   Follow-up with your primary doctor in 2-3 days regarding your visit.    Return immediately to the emergency department if you experience any of the following: difficulty breathing, or any other concerning symptoms.    Thank you for visiting our Emergency Department. It was a pleasure taking care of you today.

## 2023-08-16 NOTE — ED Triage Notes (Signed)
Pt c/o SOB that has been going on for over a week. Pt states being dx with bronchitis over 2 weeks ago and after getting better the SOB starting. Pt states no different today than the past week just increasing SOB throughout the days.

## 2023-08-16 NOTE — ED Provider Notes (Signed)
[facility] EMERGENCY DEPARTMENT AT [facility] Provider Note   CSN: [id number] Arrival date & time: 08/16/23  4098     History  Chief Complaint  Patient presents with   Shortness of Breath    Thomas Ball is a 62 y.o. male.  62 year old male with history of A-fib, hypertension, SVT, and hyperlipidemia who presents to the emergency department with shortness of breath, cough, and congestion for 3 weeks.  Says that he started feeling better until approximately 2 weeks ago when his symptoms recurred.  Primary doctor started him on prednisone and some antibiotics which she took and said that he is starting to feel better.  Still has a cough and some shortness of breath at night but continues to improve.  Says he was called by his primary doctor to come to the emergency department if you was still having any symptoms for repeat evaluation so he decided to come in today.  Also saying that he has had bilateral leg swelling and some weight gain.  Has had negative home COVID test already.  Says that he has been taking his inhalers as prescribed.  Denies any fevers or chest pain.       Home Medications Prior to Admission medications   Medication Sig Start Date End Date Taking? Authorizing Provider  furosemide (LASIX) 20 MG tablet Take 1 tablet (20 mg total) by mouth daily for 4 days. 08/16/23 08/20/23 Yes Rondel Baton, MD  albuterol (PROVENTIL) (2.5 MG/3ML) 0.083% nebulizer solution Take 3 mLs (2.5 mg total) by nebulization every 6 (six) hours as needed for wheezing or shortness of breath. 08/04/21   Sonny Masters, FNP  albuterol (VENTOLIN HFA) 108 (90 Base) MCG/ACT inhaler Inhale 2 puffs into the lungs every 6 (six) hours as needed for wheezing or shortness of breath. 08/09/23   Milian, Aleen Campi, FNP  atorvastatin (LIPITOR) 40 MG tablet Take 1 tablet (40 mg total) by mouth daily. 06/25/23   Raliegh Ip, DO  Budeson-Glycopyrrol-Formoterol (BREZTRI AEROSPHERE)  160-9-4.8 MCG/ACT AERO Inhale 2 puffs into the lungs 2 (two) times daily. 08/09/23   Milian, Aleen Campi, FNP  doxazosin (CARDURA) 1 MG tablet Take 1 tablet (1 mg total) by mouth daily. 01/18/21   Raliegh Ip, DO  doxazosin (CARDURA) 2 MG tablet TAKE 1/2 TABLET DAILY 07/06/23   Delynn Flavin M, DO  flecainide (TAMBOCOR) 50 MG tablet Take 1 tablet (50 mg total) by mouth 2 (two) times daily. 04/26/23   Pricilla Riffle, MD  fluticasone [facility]) 50 MCG/ACT nasal spray Place 2 sprays into both nostrils daily. 04/05/23   St Vena Austria, NP  metFORMIN (GLUCOPHAGE) 500 MG tablet Take 1 tablet (500 mg total) by mouth 2 (two) times daily. 06/25/23   Raliegh Ip, DO  metoprolol succinate (TOPROL XL) 25 MG 24 hr tablet Take 1 tablet (25 mg total) by mouth in the morning and at bedtime. 07/19/23   Pricilla Riffle, MD  nystatin powder Apply 1 Application topically 3 (three) times daily. X14 per flareup 04/11/23   Delynn Flavin M, DO  omeprazole (PRILOSEC) 40 MG capsule TAKE ONE (1) CAPSULE EACH DAY 05/12/22   Coralyn Helling, MD  sildenafil (VIAGRA) 100 MG tablet Take 0.5-1 tablets (50-100 mg total) by mouth daily as needed for erectile dysfunction. 06/25/23   Raliegh Ip, DO      Allergies    Patient has no known allergies.    Review of Systems   Review of Systems  Physical Exam Updated Vital Signs BP 121/76   Pulse 64   Temp 97.7 F (36.5 C) (Oral)   Resp 17   Ht 5\' 5"  (1.651 m)   Wt 136.1 kg   SpO2 95%   BMI 49.92 kg/m  Physical Exam Vitals and nursing note reviewed.  Constitutional:      General: He is not in acute distress.    Appearance: He is well-developed.  HENT:     Head: Normocephalic and atraumatic.     Right Ear: External ear normal.     Left Ear: External ear normal.     Nose: Nose normal.  Eyes:     Extraocular Movements: Extraocular movements intact.     Conjunctiva/sclera: Conjunctivae normal.     Pupils: Pupils are equal, round, and  reactive to light.  Cardiovascular:     Rate and Rhythm: Normal rate and regular rhythm.     Heart sounds: Normal heart sounds.  Pulmonary:     Effort: Pulmonary effort is normal. No respiratory distress.     Breath sounds: Rales (Bibasilar) present.  Musculoskeletal:     Cervical back: Normal range of motion and neck supple.     Right lower leg: Edema present.     Left lower leg: Edema present.  Skin:    General: Skin is warm and dry.  Neurological:     Mental Status: He is alert. Mental status is at baseline.  Psychiatric:        Mood and Affect: Mood normal.        Behavior: Behavior normal.     ED Results / Procedures / Treatments   Labs (all labs ordered are listed, but only abnormal results are displayed) Labs Reviewed  BRAIN NATRIURETIC PEPTIDE - Abnormal; Notable for the following components:      Result Value   B Natriuretic Peptide 213.0 (*)    All other components within normal limits  BASIC METABOLIC PANEL - Abnormal; Notable for the following components:   Sodium 134 (*)    Chloride 96 (*)    Glucose, Bld 132 (*)    Calcium 8.7 (*)    All other components within normal limits  CBC WITH DIFFERENTIAL/PLATELET - Abnormal; Notable for the following components:   Platelets 133 (*)    All other components within normal limits    EKG EKG Interpretation Date/Time:  Thursday August 16 2023 09:12:35 EST Ventricular Rate:  71 PR Interval:  58 QRS Duration:  94 QT Interval:  422 QTC Calculation: 353 R Axis:   14  Text Interpretation: Sinus bradycardia Atrial premature complexes in couplets Short PR interval Low voltage, precordial leads Confirmed by Vonita Moss [id number]) on 08/16/2023 9:40:26 AM  Radiology DG Chest 2 View  Result Date: 08/16/2023 CLINICAL DATA:  62 year-old male with shortness of breath and cough. EXAM: CHEST - 2 VIEW COMPARISON:  Chest CT 05/30/2023 and earlier. FINDINGS: PA and lateral views 0853 hours. Mediastinal lipomatosis and a  degree of cardiomegaly demonstrated on the prior CT. Stable cardiac size and mediastinal contours. Visualized tracheal air column is within normal limits. No pneumothorax, pulmonary edema, pleural effusion or consolidation. Calcified left midlung granuloma again noted. No acute osseous abnormality identified. Negative visible bowel gas. IMPRESSION: Stable cardiomegaly. No acute cardiopulmonary abnormality. Electronically Signed   By: Odessa Fleming M.D.   On: 08/16/2023 09:05    Procedures Procedures    Medications Ordered in ED Medications - No data to display  ED Course/ Medical Decision Making/ A&P  Medical Decision Making Amount and/or Complexity of Data Reviewed Labs: ordered. Radiology: ordered.  Risk Prescription drug management.   FEDRICK SOUTHWARD is a 62 y.o. male with comorbidities that complicate the patient evaluation including A-fib, hypertension, SVT, and hyperlipidemia who presents to the emergency department with shortness of breath, cough, and congestion for 3 weeks.   Initial Ddx:  Heart failure exacerbation, pneumonia, URI, COPD exacerbation  MDM/Course:  Patient presents to the emergency department with shortness of breath cough and congestion for 3 weeks.  Also is having lower extremity swelling.  On exam appears mildly volume overloaded does have some Rales.  Satting well on room air and is not in acute distress.  Did have a BNP that was sent that was 213 and only marginally up from prior.  Chest x-ray did show cardiomegaly but no pneumonia.  Given his evaluation feel that his symptoms are likely due to heart failure exacerbation or lingering URI.  Says he is feeling better so feel that he can continue taking Lasix and follow-up with his primary doctor as an outpatient.   This patient presents to the ED for concern of complaints listed in HPI, this involves an extensive number of treatment options, and is a complaint that carries with it a  high risk of complications and morbidity. Disposition including potential need for admission considered.   Dispo: DC Home. Return precautions discussed including, but not limited to, those listed in the AVS. Allowed pt time to ask questions which were answered fully prior to dc.  Records reviewed Outpatient Clinic Notes The following labs were independently interpreted: Chemistry and show no acute abnormality I independently reviewed the following imaging with scope of interpretation limited to determining acute life threatening conditions related to emergency care: Chest x-ray and agree with the radiologist interpretation with the following exceptions: none I personally reviewed and interpreted cardiac monitoring: Sinus bradycardia I personally reviewed and interpreted the pt's EKG: see above for interpretation  I have reviewed the patients home medications and made adjustments as needed Social Determinants of health:  Elderly  Portions of this note were generated with Scientist, clinical (histocompatibility and immunogenetics). Dictation errors may occur despite best attempts at proofreading.           Final Clinical Impression(s) / ED Diagnoses Final diagnoses:  Shortness of breath  Acute on chronic congestive heart failure, unspecified heart failure type [facility])    Rx / DC Orders ED Discharge Orders          Ordered    furosemide (LASIX) 20 MG tablet  Daily        08/16/23 1000              Rondel Baton, MD 08/17/23 2041

## 2023-08-17 ENCOUNTER — Telehealth: Payer: Self-pay | Admitting: Adult Health

## 2023-08-17 NOTE — Telephone Encounter (Signed)
Please mark no show for virtual visit 08/17/2023  Called home x 3 with no answer, straight to VM.

## 2023-08-21 ENCOUNTER — Ambulatory Visit (INDEPENDENT_AMBULATORY_CARE_PROVIDER_SITE_OTHER): Payer: Self-pay | Admitting: Family Medicine

## 2023-08-21 ENCOUNTER — Telehealth: Payer: Self-pay | Admitting: Adult Health

## 2023-08-21 ENCOUNTER — Telehealth: Payer: Self-pay | Admitting: Internal Medicine

## 2023-08-21 ENCOUNTER — Encounter: Payer: Self-pay | Admitting: Family Medicine

## 2023-08-21 VITALS — BP 103/66 | HR 85 | Temp 98.5°F | Ht 65.0 in | Wt 308.0 lb

## 2023-08-21 DIAGNOSIS — I5033 Acute on chronic diastolic (congestive) heart failure: Secondary | ICD-10-CM

## 2023-08-21 MED ORDER — FUROSEMIDE 20 MG PO TABS: 20.0000 mg | ORAL_TABLET | Freq: Every day | ORAL | 0 refills | Status: DC

## 2023-08-21 NOTE — Progress Notes (Signed)
Subjective: CC: ER follow-up PCP: Raliegh Ip, DO Thomas Ball is a 62 y.o. male presenting to clinic today for:  1.  Acute on chronic heart failure Patient was seen in ER recently for shortness of breath.  He had chest x-ray which demonstrated no pulmonary infiltrates but BNP was up from baseline.  He was retaining fluid and notes that ever since he was started on the 4-day course of Lasix he is lost about 20 pounds in fluid.  To continue Lasix.  He has reached out to his cardiologist and pulmonologist but has not yet heard back.  He admits that he continues to consume frozen foods that are things that contain salt but he is prepared to start making his food on his own.   ROS: Per HPI  No Known Allergies Past Medical History:  Diagnosis Date   GERD (gastroesophageal reflux disease)    HCV (hepatitis C virus) DR. ZACKS   AUG 2013 VIRAL LOAD UNDETECTABLE   History of cardiac catheterization    a. 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   Sleep apnea    SVT (supraventricular tachycardia) (HCC)     Current Outpatient Medications:    albuterol (PROVENTIL) (2.5 MG/3ML) 0.083% nebulizer solution, Take 3 mLs (2.5 mg total) by nebulization every 6 (six) hours as needed for wheezing or shortness of breath., Disp: 150 mL, Rfl: 1   albuterol (VENTOLIN HFA) 108 (90 Base) MCG/ACT inhaler, Inhale 2 puffs into the lungs every 6 (six) hours as needed for wheezing or shortness of breath., Disp: 8 g, Rfl: 2   atorvastatin (LIPITOR) 40 MG tablet, Take 1 tablet (40 mg total) by mouth daily., Disp: 90 tablet, Rfl: 3   Budeson-Glycopyrrol-Formoterol (BREZTRI AEROSPHERE) 160-9-4.8 MCG/ACT AERO, Inhale 2 puffs into the lungs 2 (two) times daily., Disp: 10.7 g, Rfl: 11   doxazosin (CARDURA) 1 MG tablet, Take 1 tablet (1 mg total) by mouth daily., Disp: 90 tablet,  Rfl: 1   doxazosin (CARDURA) 2 MG tablet, TAKE 1/2 TABLET DAILY, Disp: 45 tablet, Rfl: 1   flecainide (TAMBOCOR) 50 MG tablet, Take 1 tablet (50 mg total) by mouth 2 (two) times daily., Disp: , Rfl:    fluticasone (FLONASE) 50 MCG/ACT nasal spray, Place 2 sprays into both nostrils daily., Disp: 16 g, Rfl: 0   metFORMIN (GLUCOPHAGE) 500 MG tablet, Take 1 tablet (500 mg total) by mouth 2 (two) times daily., Disp: 180 tablet, Rfl: 3   metoprolol succinate (TOPROL XL) 25 MG 24 hr tablet, Take 1 tablet (25 mg total) by mouth in the morning and at bedtime., Disp: 180 tablet, Rfl: 3   nystatin powder, Apply 1 Application topically 3 (three) times daily. X14 per flareup, Disp: 60 g, Rfl: 0   omeprazole (PRILOSEC) 40 MG capsule, TAKE ONE (1) CAPSULE EACH DAY, Disp: 90 capsule, Rfl: 1   sildenafil (VIAGRA) 100 MG tablet, Take 0.5-1 tablets (50-100 mg total) by mouth daily as needed for erectile dysfunction., Disp: 5 tablet, Rfl: 11   furosemide (LASIX) 20 MG tablet, Take 1 tablet (20 mg total) by mouth daily for 4 days., Disp: 5 tablet, Rfl: 0 Social History   Socioeconomic History   Marital status: Widowed    Spouse name: Not on file   Number of children: Not on file   Years of education: Not  on file   Highest education level: Not on file  Occupational History   Occupation: concrete work    Associate Professor: SELF-EMPLOYED  Tobacco Use   Smoking status: Every Day    Current packs/day: 2.00    Average packs/day: 2.0 packs/day for 45.0 years (90.0 ttl pk-yrs)    Types: Cigarettes   Smokeless tobacco: Never   Tobacco comments:    smokes 1-1.5 packs per day  Vaping Use   Vaping status: Never Used  Substance and Sexual Activity   Alcohol use: No   Drug use: No    Comment: in the 1980s, smoke crack. None since Dec 2011.    Sexual activity: Not Currently  Other Topics Concern   Not on file  Social History Narrative   Not on file   Social Determinants of Health   Financial Resource Strain: Not on  file  Food Insecurity: Not on file  Transportation Needs: Not on file  Physical Activity: Not on file  Stress: Not on file  Social Connections: Not on file  Intimate Partner Violence: Not on file   Family History  Problem Relation Age of Onset   Emphysema Mother    COPD Mother    Early death Mother    Hyperlipidemia Father    Stroke Father    Diabetes Father    High Cholesterol Father    Colon cancer Neg Hx     Objective: Office vital signs reviewed. BP 103/66   Pulse 85   Temp 98.5 F (36.9 C)   Ht 5\' 5"  (1.651 m)   Wt (!) 308 lb (139.7 kg)   SpO2 96%   BMI 51.25 kg/m   Physical Examination:  General: Awake, alert, super morbidly obese, No acute distress HEENT: Sclera white.  Moist mucous membranes Cardio: regular rate and rhythm, S1S2 heard, no murmurs appreciated Pulm: clear to auscultation bilaterally, no wheezes, rhonchi or rales; normal work of breathing on room air Extremities: warm, well perfused, trace leg edema to mid shins bilaterally, no cyanosis or clubbing; +2 pulses bilaterally    Assessment/ Plan: 62 y.o. male   Acute on chronic diastolic heart failure (HCC) - Plan: furosemide (LASIX) 20 MG tablet, Basic Metabolic Panel  I reviewed his labs.  I have renewed the Lasix and encouraged him to use this if he increases by 3 pounds in 24 hours.  I will CC cardiology as FYI.  I encouraged him to continue trying and arrange an appointment with them on the outpatient setting as they may want to keep him on this medication long-term.  Will check potassium level.  May need to consider adding supplemental potassium with Lasix use   Raliegh Ip, DO [facility] [phone]

## 2023-08-21 NOTE — Telephone Encounter (Signed)
Left a message for the pt to call back.  

## 2023-08-21 NOTE — Telephone Encounter (Signed)
Pt would like to discuss his new medication, he was given fluid pills to take the fluid off his lungs while at the er

## 2023-08-21 NOTE — Telephone Encounter (Signed)
Pt called to inform Dr. Tenny Craw that he went to the ED last week and was told he is retaining a lot of fluid. He states he started taking furosemide and "in a few days I lost like 25 lbs". The doctor there suggested he reach out to Dr. Tenny Craw because it may be related to his heart.

## 2023-08-22 LAB — BASIC METABOLIC PANEL
BUN/Creatinine Ratio: 27 — ABNORMAL HIGH (ref 10–24)
BUN: 23 mg/dL (ref 8–27)
CO2: 23 mmol/L (ref 20–29)
Calcium: 9 mg/dL (ref 8.6–10.2)
Chloride: 100 mmol/L (ref 96–106)
Creatinine, Ser: 0.85 mg/dL (ref 0.76–1.27)
Glucose: 158 mg/dL — ABNORMAL HIGH (ref 70–99)
Potassium: 4.4 mmol/L (ref 3.5–5.2)
Sodium: 137 mmol/L (ref 134–144)
eGFR: 99 mL/min/{1.73_m2} (ref >59)

## 2023-08-22 NOTE — Progress Notes (Signed)
Patient will need follow up in cardiology clinic  Seen in ER for volume overload   Given lasix 1  Confirm that he is still taking it 2.  Needs to limit salt intake    Minimize processed foods 3  Set up for appt in [place]

## 2023-08-22 NOTE — Telephone Encounter (Signed)
Patient has reached out to cardiology regarding Furosemide. Nothing further needed at this time.

## 2023-08-28 NOTE — Telephone Encounter (Signed)
Patient is returning phone call.  °

## 2023-08-28 NOTE — Telephone Encounter (Addendum)
  Per Dr Tenny Craw:    Patient will need follow up in cardiology clinic  Seen in ER for volume overload   Given lasix 1  Confirm that he is still taking it 2.  Needs to limit salt intake    Minimize processed foods 3  Set up for appt in [place]

## 2023-08-29 ENCOUNTER — Telehealth: Payer: Self-pay

## 2023-08-29 NOTE — Telephone Encounter (Signed)
-----   Message from Nurse Dewayne Hatch B sent at 08/28/2023  8:02 AM EST -----  ----- Message ----- From: Cherylann Banas, RN Sent: 08/28/2023   7:35 AM EST To: Bertram Millard, RN   ----- Message ----- From: Pricilla Riffle, MD Sent: 08/22/2023   4:37 PM EST To: Mickie Bail [facility] Triage     ----- Message ----- From: Raliegh Ip, DO Sent: 08/21/2023   5:15 PM EST To: Pricilla Riffle, MD

## 2023-08-29 NOTE — Telephone Encounter (Signed)
Spoke to pt who verbalized he was continuing Lasix per hospital f/u. Pt stated that he has an appointment with Dr. Tenny Craw on 12/27. Pt had no questions or concerns at this time.

## 2023-08-29 NOTE — Telephone Encounter (Signed)
Late entry:   I spoke with the pt late yesterday and he says he has had much improvement except he has been taking the Lasix as needed and sometime 20 mg and sometimes 40 mg. After taking 40 mg at home for a few days he had no more peripheral edema and his breathing was much improved.  His edema continues to stay down but his breathing is hard on and off so he takes 1-2 lasix and it improves.   I sched him for the [place] office for 09/21/23... I will talk with Dr Tenny Craw about a plan for his lasix and any new labs.

## 2023-08-29 NOTE — Telephone Encounter (Signed)
I think that is OK  Alternating as needed  Follow up appt as planned

## 2023-08-29 NOTE — Telephone Encounter (Signed)
Pt advised.

## 2023-08-30 ENCOUNTER — Telehealth: Payer: Self-pay

## 2023-08-30 NOTE — Progress Notes (Signed)
Pharmacy Medication Assistance Program Note    09/06/2023  Patient ID: Thomas Ball, male   DOB: May 04, 1961, 62 y.o.   MRN: [id number]     08/30/2023  Outreach [employer]  Manufacturer [employer] Nurse, adult Drugs [employer]  Type of Radiographer, therapeutic Assistance  Name of Prescriber Delynn Flavin  Date Application Received From Provider 08/21/2023  Date Application Submitted to Manufacturer 08/30/2023  Method Application Sent to Manufacturer Fax  Patient Assistance Determination Denied  Patient Denial Reasons Other    May be eligible for alternative funding source - patient should apply for medicaid.

## 2023-09-21 ENCOUNTER — Other Ambulatory Visit (HOSPITAL_COMMUNITY)
Admission: RE | Admit: 2023-09-21 | Discharge: 2023-09-21 | Disposition: A | Discharge status: Home / Self Care | Payer: Self-pay | Source: Ambulatory Visit | Attending: Internal Medicine | Admitting: Internal Medicine

## 2023-09-21 ENCOUNTER — Ambulatory Visit: Payer: Self-pay | Attending: Internal Medicine | Admitting: Internal Medicine

## 2023-09-21 ENCOUNTER — Ambulatory Visit: Payer: Self-pay | Admitting: Internal Medicine

## 2023-09-21 ENCOUNTER — Encounter: Payer: Self-pay | Admitting: Internal Medicine

## 2023-09-21 VITALS — BP 122/70 | HR 60 | Ht 65.0 in | Wt 312.6 lb

## 2023-09-21 DIAGNOSIS — E1165 Type 2 diabetes mellitus with hyperglycemia: Secondary | ICD-10-CM

## 2023-09-21 DIAGNOSIS — I10 Essential (primary) hypertension: Secondary | ICD-10-CM | POA: Insufficient documentation

## 2023-09-21 DIAGNOSIS — R002 Palpitations: Secondary | ICD-10-CM | POA: Insufficient documentation

## 2023-09-21 DIAGNOSIS — R609 Edema, unspecified: Secondary | ICD-10-CM

## 2023-09-21 LAB — CBC
HCT: 41.5 % (ref 39.0–52.0)
Hemoglobin: 13.9 g/dL (ref 13.0–17.0)
MCH: 31.5 pg (ref 26.0–34.0)
MCHC: 33.5 g/dL (ref 30.0–36.0)
MCV: 94.1 fL (ref 80.0–100.0)
Platelets: 134 10*3/uL — ABNORMAL LOW (ref 150–400)
RBC: 4.41 MIL/uL (ref 4.22–5.81)
RDW: 13.5 % (ref 11.5–15.5)
WBC: 6.4 10*3/uL (ref 4.0–10.5)
nRBC: 0 % (ref 0.0–0.2)

## 2023-09-21 LAB — BRAIN NATRIURETIC PEPTIDE: B Natriuretic Peptide: 182 pg/mL — ABNORMAL HIGH (ref 0.0–100.0)

## 2023-09-21 LAB — BASIC METABOLIC PANEL
Anion gap: 8 (ref 5–15)
BUN: 15 mg/dL (ref 8–23)
CO2: 26 mmol/L (ref 22–32)
Calcium: 8.9 mg/dL (ref 8.9–10.3)
Chloride: 100 mmol/L (ref 98–111)
Creatinine, Ser: 0.87 mg/dL (ref 0.61–1.24)
GFR, Estimated: >60 mL/min (ref >60)
Glucose, Bld: 123 mg/dL — ABNORMAL HIGH (ref 70–99)
Potassium: 4 mmol/L (ref 3.5–5.1)
Sodium: 134 mmol/L — ABNORMAL LOW (ref 135–145)

## 2023-09-21 LAB — MAGNESIUM: Magnesium: 2 mg/dL (ref 1.7–2.4)

## 2023-09-21 LAB — TSH: TSH: 1.72 u[IU]/mL (ref 0.350–4.500)

## 2023-09-21 NOTE — Patient Instructions (Signed)
Medication Instructions:  Your physician recommends that you continue on your current medications as directed. Please refer to the Current Medication list given to you today.  *If you need a refill on your cardiac medications before your next appointment, please call your pharmacy*   Lab Work: Your physician recommends that you return for lab work today. ( CBC, TSH, BMET, BNP, A1C, Magnesium)   If you have labs (blood work) drawn today and your tests are completely normal, you will receive your results only by: MyChart Message (if you have MyChart) OR A paper copy in the mail If you have any lab test that is abnormal or we need to change your treatment, we will call you to review the results.   Testing/Procedures: NONE    Follow-Up: At [facility], you and your health needs are our priority.  As part of our continuing mission to provide you with exceptional heart care, we have created designated Provider Care Teams.  These Care Teams include your primary Cardiologist (physician) and Advanced Practice Providers (APPs -  Physician Assistants and Nurse Practitioners) who all work together to provide you with the care you need, when you need it.  We recommend signing up for the patient portal called "MyChart".  Sign up information is provided on this After Visit Summary.  MyChart is used to connect with patients for Virtual Visits (Telemedicine).  Patients are able to view lab/test results, encounter notes, upcoming appointments, etc.  Non-urgent messages can be sent to your provider as well.   To learn more about what you can do with MyChart, go to [email or web address].    Your next appointment:   6 month(s)  Provider:   You may see Dietrich Pates, MD or one of the following Advanced Practice Providers on your designated Care Team:   Randall An, PA-C  Jacolyn Reedy, PA-C     Other Instructions Thank you for choosing [facility]!

## 2023-09-21 NOTE — Progress Notes (Signed)
Cardiology Office Note   Date:  09/21/2023   ID:  Jarris, Ruecker 1961/01/31, MRN [id number]  PCP:  Raliegh Ip, DO  Cardiologist:   Dietrich Pates, MD   Pt presents for follow up of palpitations     History of Present Illness: Thomas Ball is a 62 y.o. male with a history of palpitations, tachy/brady syndrome.  He is s/p SVT ablation in 2015    He also has a hx of normal coronary arteries (LHC 2020), HL, HTN, cirrhosis, Hep C, GERD, OSA (using CPAP)    I last saw the pt in June 2024  Flecanide was increased to 75 bid   He felt poorly  Foggy, dizzy  REcomm he cut back to 50 bid  Seen in ER on 04/29/23 with low  Felt possibly erroneous   Labs negative  BP OK    In October 2024 the pt wore a  Zio patch.  It showed frequent PACs  This showed 20% PACs   21 bursts of SVT, longest for 10 beats  Recomm increasing Toprol XL to 25 bid   Pt went to ER on 08/16/23    He had had a cough and congestion for a few wks prior   Rx prednisone and ABX by PCP   Had LE edema Pt given Rx for lasix   Has been on 20 mg since   Since the ER visit he  says swelling improved  Breathing is OK     Pt notes occasional CP along ribs   Occasionally across chest   Not associated with activity  ? Related to cough Has occasional dizziness   Not like this summer   Current Meds  Medication Sig   albuterol (PROVENTIL) (2.5 MG/3ML) 0.083% nebulizer solution Take 3 mLs (2.5 mg total) by nebulization every 6 (six) hours as needed for wheezing or shortness of breath.   albuterol (VENTOLIN HFA) 108 (90 Base) MCG/ACT inhaler Inhale 2 puffs into the lungs every 6 (six) hours as needed for wheezing or shortness of breath.   atorvastatin (LIPITOR) 40 MG tablet Take 1 tablet (40 mg total) by mouth daily.   Budeson-Glycopyrrol-Formoterol (BREZTRI AEROSPHERE) 160-9-4.8 MCG/ACT AERO Inhale 2 puffs into the lungs 2 (two) times daily.   doxazosin (CARDURA) 1 MG tablet Take 1 tablet (1 mg total) by mouth daily.    doxazosin (CARDURA) 2 MG tablet TAKE 1/2 TABLET DAILY   flecainide (TAMBOCOR) 50 MG tablet Take 1 tablet (50 mg total) by mouth 2 (two) times daily.   fluticasone (FLONASE) 50 MCG/ACT nasal spray Place 2 sprays into both nostrils daily.   furosemide (LASIX) 20 MG tablet Take 1 tablet (20 mg total) by mouth daily.   metFORMIN (GLUCOPHAGE) 500 MG tablet Take 1 tablet (500 mg total) by mouth 2 (two) times daily.   metoprolol succinate (TOPROL XL) 25 MG 24 hr tablet Take 1 tablet (25 mg total) by mouth in the morning and at bedtime.   omeprazole (PRILOSEC) 40 MG capsule TAKE ONE (1) CAPSULE EACH DAY   sildenafil (VIAGRA) 100 MG tablet Take 0.5-1 tablets (50-100 mg total) by mouth daily as needed for erectile dysfunction.     Allergies:   Patient has no known allergies.   Past Medical History:  Diagnosis Date   GERD (gastroesophageal reflux disease)    HCV (hepatitis C virus) DR. ZACKS   AUG 2013 VIRAL LOAD UNDETECTABLE   History of cardiac catheterization    a. 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   CHOLECYSTECTOMY     COLONOSCOPY  10/04/2012   SLF:ONE/8 SIMPLE ADENOMA, TICS, SML IH   ESOPHAGOGASTRODUODENOSCOPY N/A 09/04/2014   Procedure: ESOPHAGOGASTRODUODENOSCOPY (EGD);  Surgeon: West Bali, MD;  Location: [facility];  Service: Endoscopy;  Laterality: N/A;  145   GALLBLADDER SURGERY     JAW BROKEN     LIPOMA REMOVALS     SEVERAL   RIGHT/LEFT HEART CATH AND CORONARY ANGIOGRAPHY N/A 10/08/2018   Procedure: RIGHT/LEFT HEART CATH AND CORONARY ANGIOGRAPHY;  Surgeon: Swaziland, Peter M, MD;  Location: [facility] INVASIVE CV LAB;  Service: Cardiovascular;  Laterality: N/A;   SUPRAVENTRICULAR  TACHYCARDIA ABLATION N/A 06/22/2014   Procedure: SUPRAVENTRICULAR TACHYCARDIA ABLATION;  Surgeon: Marinus Maw, MD;  Location: [facility] CATH LAB;  Service: Cardiovascular;  Laterality: N/A;     Social History:  The patient  reports that he has been smoking cigarettes. He has a 90 pack-year smoking history. He has never used smokeless tobacco. He reports that he does not drink alcohol and does not use drugs.   Family History:  The patient's family history includes COPD in his mother; Diabetes in his father; Early death in his mother; Emphysema in his mother; High Cholesterol in his father; Hyperlipidemia in his father; Stroke in his father.    ROS:  Please see the history of present illness. All other systems are reviewed and  Negative to the above problem except as noted.    PHYSICAL EXAM: VS:  BP 122/70   Pulse 60   Ht 5\' 5"  (1.651 m)   Wt (!) 312 lb 9.6 oz (141.8 kg)   SpO2 96%   BMI 52.02 kg/m    Pulse while listening to him is 56 to 65   bpm   GEN:  Morbidly obese 62 yo in NAD  Examined in chair  HEENT: normal  Neck: no JVD  Cardiac: RRR; no murmur  Trivial LE  edema  Respiratory:  clear to auscultation  GI: soft, nontender MS: no deformity Moving all extremities      EKG:  EKG shows SR with PACs  60 bpm     Lipid Panel    Component Value Date/Time   CHOL 105 03/13/2023 0817   TRIG 74 03/13/2023 0817   HDL 39 (L) 03/13/2023 0817   CHOLHDL 2.7 03/13/2023 0817   CHOLHDL 5.4 09/06/2017 1321   VLDL 24 09/06/2017 1321   LDLCALC 51 03/13/2023 0817      Wt Readings from Last 3 Encounters:  09/21/23 (!) 312 lb 9.6 oz (141.8 kg)  08/21/23 (!) 308 lb (139.7 kg)  08/16/23 300 lb (136.1 kg)      ASSESSMENT AND PLAN:  1  Atrial ectopy   Pt continues to have PACs   He does not appear to be bothered by them   He backed down on flecaniide to 50 mg bid   Continue   Check labs today   2  Dyspnea  Pt seen in ER in November   Had dyspnea and some LE edema   After URI    has  been taking 20 mg lasix that  was prescribed there since     Will get labs today   2  HL   LDL 51  HDL 39  Excellent  continue on statin   3 Metabolic  WIll check A1C today    Reviewed diet   Cut out carbs     Follow up in 6 months   Current medicines are reviewed at length with the patient today.  The patient does not have concerns regarding medicines.  Signed, Dietrich Pates, MD  09/21/2023 2:46 PM    [facility] [address] [place], [place], [place]  [address] Phone: [phone]; Fax: [phone]

## 2023-09-22 LAB — HEMOGLOBIN A1C
Hgb A1c MFr Bld: 7 % — ABNORMAL HIGH (ref 4.8–5.6)
Mean Plasma Glucose: 154 mg/dL

## 2023-09-24 ENCOUNTER — Ambulatory Visit: Payer: Self-pay | Admitting: Internal Medicine

## 2023-10-01 ENCOUNTER — Other Ambulatory Visit: Payer: Self-pay

## 2023-10-01 ENCOUNTER — Telehealth: Payer: Self-pay | Admitting: Family Medicine

## 2023-10-01 MED ORDER — DOXAZOSIN MESYLATE 2 MG PO TABS: 1.0000 mg | ORAL_TABLET | Freq: Every day | ORAL | 1 refills | Status: DC

## 2023-10-01 NOTE — Telephone Encounter (Signed)
 Copied from CRM [id number]. Topic: Clinical - Medication Question >> Oct 01, 2023 10:15 AM Zacyrah J wrote: Reason for CRM: pt would like for Dr. Jolinda to refill his blood pressure pill (pt is un ware of the name of the blood pressure pill) pt hasn't seen Dr. Jolinda in a month and would to know if he should be seen or if Dr. Jolinda can call in his blood pressure pill.

## 2023-10-02 ENCOUNTER — Other Ambulatory Visit: Payer: Self-pay | Admitting: Family Medicine

## 2023-10-02 NOTE — Telephone Encounter (Signed)
 I see doxazosin  was sent in.  I suspect this is what he is requesting?  Please make sure he is only taking 1/2 tablet since the 2mg  was sent in. Daily dose was last rx at 1mg  per day.  If having some prostate issues, recommend follow up with urology for further investigation.

## 2023-10-02 NOTE — Telephone Encounter (Signed)
 Lmtcb

## 2023-10-05 ENCOUNTER — Other Ambulatory Visit: Payer: Self-pay | Admitting: Family Medicine

## 2023-10-05 DIAGNOSIS — I5033 Acute on chronic diastolic (congestive) heart failure: Secondary | ICD-10-CM

## 2023-10-09 DIAGNOSIS — I491 Atrial premature depolarization: Secondary | ICD-10-CM

## 2023-10-09 DIAGNOSIS — R001 Bradycardia, unspecified: Secondary | ICD-10-CM

## 2023-10-09 DIAGNOSIS — I471 Supraventricular tachycardia, unspecified: Secondary | ICD-10-CM

## 2023-10-15 ENCOUNTER — Ambulatory Visit: Payer: Self-pay | Admitting: Family Medicine

## 2023-10-15 ENCOUNTER — Ambulatory Visit (INDEPENDENT_AMBULATORY_CARE_PROVIDER_SITE_OTHER): Payer: Self-pay | Admitting: Nurse Practitioner

## 2023-10-15 ENCOUNTER — Encounter: Payer: Self-pay | Admitting: Nurse Practitioner

## 2023-10-15 VITALS — BP 99/58 | HR 60 | Temp 97.9°F | Ht 65.0 in | Wt 304.0 lb

## 2023-10-15 DIAGNOSIS — R0981 Nasal congestion: Secondary | ICD-10-CM | POA: Insufficient documentation

## 2023-10-15 DIAGNOSIS — J101 Influenza due to other identified influenza virus with other respiratory manifestations: Secondary | ICD-10-CM | POA: Insufficient documentation

## 2023-10-15 DIAGNOSIS — R051 Acute cough: Secondary | ICD-10-CM | POA: Insufficient documentation

## 2023-10-15 DIAGNOSIS — R52 Pain, unspecified: Secondary | ICD-10-CM | POA: Insufficient documentation

## 2023-10-15 DIAGNOSIS — B372 Candidiasis of skin and nail: Secondary | ICD-10-CM

## 2023-10-15 LAB — VERITOR FLU A/B WAIVED
Influenza A: POSITIVE — AB
Influenza B: NEGATIVE

## 2023-10-15 MED ORDER — OSELTAMIVIR PHOSPHATE 75 MG PO CAPS: 75.0000 mg | ORAL_CAPSULE | Freq: Two times a day (BID) | ORAL | 0 refills | Status: DC

## 2023-10-15 MED ORDER — NYSTATIN 100000 UNIT/GM EX POWD: 1.0000 | Freq: Three times a day (TID) | CUTANEOUS | 0 refills | Status: DC

## 2023-10-15 NOTE — Progress Notes (Addendum)
Acute Office Visit  Subjective:     Patient ID: Thomas Ball, male    DOB: 1961/08/11, 63 y.o.   MRN: [id number]  Chief Complaint  Patient presents with   Nasal Congestion    Symptoms since last Friday, exposed to family member with flu   Chills   Fatigue   Cough    HPI Thomas Ball is a 63 y.o. male present for an acute visit 10/15/23 concerns for flu like symptoms and ned nystatin powder refill. who complains of congestion, myalgias, and flu like symptoms for 3 days." I was around people dx with flu" He denies a history of anorexia, chest pain, dizziness, fevers, weakness, and wheezing and denies a history of asthma. Patient POC fLu A positive  Active Ambulatory Problems    Diagnosis Date Noted   Pulmonary nodule 02/14/2012   GERD (gastroesophageal reflux disease) 07/18/2012   Screening for colorectal cancer 07/18/2012   Cirrhosis of liver (HCC) 11/05/2013   Intermittent palpitations 04/21/2014   Bradycardia 04/21/2014   Morbid obesity (HCC) 04/21/2014   SVT (supraventricular tachycardia) (HCC)    Tobacco abuse 07/02/2015   EIC (epidermal inclusion cyst) 07/23/2015   BPH (benign prostatic hyperplasia) 10/07/2015   OSA (obstructive sleep apnea) 03/04/2018   Hypertension associated with diabetes (HCC) 04/26/2018   Panic attacks 12/07/2020   Chest congestion 12/07/2020   Hyperlipidemia associated with type 2 diabetes mellitus (HCC) 04/29/2021   Long term (current) use of oral hypoglycemic drugs 03/13/2023   Diabetes mellitus type II, controlled (HCC) 03/13/2023   Low back pain 04/05/2023   URI with cough and congestion 04/05/2023   Tooth abscess 04/05/2023   Influenza A virus present 10/15/2023   Nasal congestion 10/15/2023   Acute cough 10/15/2023   Body aches 10/15/2023   Resolved Ambulatory Problems    Diagnosis Date Noted   Hepatitis C, chronic (HCC) 12/12/2010   Hepatitis C 01/21/2012   Chest pain 04/20/2014   Hypotension 04/21/2014   Cough 09/17/2015    SOB (shortness of breath) 09/15/2018   Dyspnea    Pure hypercholesterolemia 01/12/2020   Headache in front of head 12/07/2020   Uncontrolled type 2 diabetes mellitus with hyperglycemia (HCC) 04/29/2021   Past Medical History:  Diagnosis Date   HCV (hepatitis C virus) DR. ZACKS   History of cardiac catheterization    Hypercholesteremia    Hypertension    Pancreatitis, gallstone    Skin cancer 1992   Sleep apnea     ROS Negative unless indicated in HPI    Objective:    BP (!) 99/58   Pulse 60   Temp 97.9 F (36.6 C) (Temporal)   Ht 5\' 5"  (1.651 m)   Wt (!) 304 lb (137.9 kg)   SpO2 97%   BMI 50.59 kg/m  BP Readings from Last 3 Encounters:  10/15/23 (!) 99/58  09/21/23 122/70  08/21/23 103/66   Wt Readings from Last 3 Encounters:  10/15/23 (!) 304 lb (137.9 kg)  09/21/23 (!) 312 lb 9.6 oz (141.8 kg)  08/21/23 (!) 308 lb (139.7 kg)      Physical Exam -year-old he appears well, vital signs are as noted. Ears normal.  Throat and pharynx normal.  Neck supple. No adenopathy in the neck. Nose is congested. Sinuses non tender. The chest is clear, without wheezes or rales. No results found for any visits on 10/15/23.      Assessment & Plan:  Influenza A virus present -     Oseltamivir Phosphate; Take  1 capsule (75 mg total) by mouth 2 (two) times daily.  Dispense: 10 capsule; Refill: 0  Acute cough -     Veritor Flu A/B Waived -     Oseltamivir Phosphate; Take 1 capsule (75 mg total) by mouth 2 (two) times daily.  Dispense: 10 capsule; Refill: 0  Nasal congestion -     Veritor Flu A/B Waived -     Oseltamivir Phosphate; Take 1 capsule (75 mg total) by mouth 2 (two) times daily.  Dispense: 10 capsule; Refill: 0  Body aches -     Veritor Flu A/B Waived -     Oseltamivir Phosphate; Take 1 capsule (75 mg total) by mouth 2 (two) times daily.  Dispense: 10 capsule; Refill: 0  Candidal intertrigo -     Nystatin; Apply 1 Application topically 3 (three) times daily. X14  per flareup  Dispense: 60 g; Refill: 0  [name] is a 63 year old Caucasian male seen today for flu A, no acute distress viral upper respiratory illness and influenza A Tamiflu 75 mg twice daily for 5 days, increase hydration Tylenol/ibuprofen for fever rest, and return office visit prn if symptoms persist or worsen. Lack of antibiotic effectiveness discussed with him. Call or return to clinic prn if these symptoms worsen or fail to improve as anticipated.   -Nyastin Powder statin refilled  Encourage healthy lifestyle choices, including diet (rich in fruits, vegetables, and lean proteins, and low in salt and simple carbohydrates) and exercise (at least 30 minutes of moderate physical activity daily).     The above assessment and management plan was discussed with the patient. The patient verbalized understanding of and has agreed to the management plan. Patient is aware to call the clinic if they develop any new symptoms or if symptoms persist or worsen. Patient is aware when to return to the clinic for a follow-up visit. Patient educated on when it is appropriate to go to the emergency department.  Return if symptoms worsen or fail to improve. Thomas Ball, [facility] [address] [place], [place] [address] [phone]  Note: This document was prepared by [employer] voice dictation technology and any errors that results from this process are unintentional.

## 2023-10-15 NOTE — Telephone Encounter (Signed)
Copied from CRM [id number]. Topic: Clinical - Red Word Triage >> Oct 15, 2023  8:41 AM Thomas Ball wrote: Red Word that prompted transfer to Nurse Triage: Breathing concerns, rough breathing shortness of breath, copd. Also body aches and cold sweats.   Chief Complaint: Flu like symptoms Symptoms: productive cough, muscle aches, body aches, congestion, recent exposure to family in the home with have been diagnosed with the Flu. Frequency: x 3-4 days Pertinent Negatives: Patient denies severe difficulty breathing Disposition: [] ED /[] Urgent Care (no appt availability in office) / [x] Appointment(In office/virtual)/ []  [facility]/ [] Home Care/ [] Refused Recommended Disposition /[] [place] [facility]/ []  Follow-up with PCP Additional Notes: Patient called and advised that he has been sick for the past 3-4 days now.  He had cold chills and sweats, body aches, muscle aches, and he states that his son and his son's family tested positive for the Flu recently.  He states that he has been around them in the home.  He also had a sore throat briefly the first day he started feeling sick but that sore throat has stopped hurting since then.  Patient is a diabetic and states that he has COPD and heart problems.  He advised that he had the flu shot this year.  Patient appointment is made for today 10/15/2023 at 10:30am with Arrie Aran. [place] Lighter.  Patient is advised that if he gets worse to go to the emergency room.  Patient verbalized understanding.  Reason for Disposition  [1] Influenza EXPOSURE (Close Contact) within last 48 hours (2 days) AND [2] exposed person is HIGH RISK (e.g., age > 64 years, pregnant, HIV+, chronic medical condition)  Answer Assessment - Initial Assessment Questions 1. TYPE of EXPOSURE: "How were you exposed?" (e.g., close contact, not a close contact)     Lives with the son and son's family who all have the Flu.  He has been around them but not constantly in the same  room 2. DATE of EXPOSURE: "When did the exposure occur?" (e.g., hour, days, weeks)     The past few days 4. HIGH RISK for COMPLICATIONS: "Do you have any heart or lung problems?" "Do you have a weakened immune system?" (e.g., CHF, COPD, asthma, HIV positive, chemotherapy, renal failure, diabetes mellitus, sickle cell anemia)     COPD and diabetes, and heart problems per patient 5. SYMPTOMS: "Do you have any symptoms?" (e.g., cough, fever, sore throat, difficulty breathing).     Productive cough, muscle aches, cold chills, body aches, sore throat the first day briefly but that stopped, unknown of fever, patient sounds congested on the phone  Protocols used: Influenza (Flu) Exposure-A-AH

## 2023-10-16 ENCOUNTER — Ambulatory Visit: Payer: Self-pay | Admitting: Adult Health

## 2023-10-25 ENCOUNTER — Encounter: Payer: Self-pay | Admitting: Internal Medicine

## 2023-10-25 NOTE — Telephone Encounter (Signed)
Lm on vm. 2nd attempt. LS

## 2023-10-30 ENCOUNTER — Telehealth: Payer: Self-pay | Admitting: Family Medicine

## 2023-10-30 NOTE — Telephone Encounter (Signed)
Left message no samples in clinic

## 2023-10-30 NOTE — Telephone Encounter (Signed)
 Copied from CRM [id number]. Topic: Clinical - Medication Question >> Oct 30, 2023  8:53 AM Thomas Ball ORN wrote: Reason for CRM: Patient requesting more sample of breztri  the medication is for his breathing , something patient take everyday twice a day , will be out of medication samples in 4 days  Provider allow patient to get samples since the medication is so high

## 2023-10-31 ENCOUNTER — Telehealth: Payer: Self-pay | Admitting: *Deleted

## 2023-10-31 NOTE — Telephone Encounter (Signed)
 Patient states he has checked with is PCP (they prescribed the medication) and they do not have samples---he has enough for 3 more days and does not have the $500.00 to pick up the refill

## 2023-10-31 NOTE — Telephone Encounter (Signed)
Gave patient a sample  of medication to  help until is appt with Tammy

## 2023-10-31 NOTE — Telephone Encounter (Signed)
Pt has been notified. LS

## 2023-10-31 NOTE — Telephone Encounter (Signed)
Patient is a walk in today and is wondering if we have any samples of Breztri--he has an upcoming appointment with Rubye Oaks,, NP  12/11/23

## 2023-11-19 ENCOUNTER — Encounter: Payer: Self-pay | Admitting: Nurse Practitioner

## 2023-11-19 ENCOUNTER — Telehealth: Payer: Self-pay | Admitting: Adult Health

## 2023-11-19 ENCOUNTER — Ambulatory Visit (INDEPENDENT_AMBULATORY_CARE_PROVIDER_SITE_OTHER): Payer: Self-pay | Admitting: Nurse Practitioner

## 2023-11-19 VITALS — BP 122/71 | HR 52 | Temp 97.1°F | Ht 65.0 in | Wt 309.0 lb

## 2023-11-19 DIAGNOSIS — R339 Retention of urine, unspecified: Secondary | ICD-10-CM

## 2023-11-19 DIAGNOSIS — R300 Dysuria: Secondary | ICD-10-CM

## 2023-11-19 DIAGNOSIS — N3000 Acute cystitis without hematuria: Secondary | ICD-10-CM

## 2023-11-19 LAB — MICROSCOPIC EXAMINATION
Renal Epithel, UA: NONE SEEN /HPF
WBC, UA: >30 /HPF — AB (ref 0–5)
Yeast, UA: NONE SEEN

## 2023-11-19 LAB — URINALYSIS, COMPLETE
Bilirubin, UA: NEGATIVE
Glucose, UA: NEGATIVE
Ketones, UA: NEGATIVE
Nitrite, UA: NEGATIVE
Specific Gravity, UA: 1.02 (ref 1.005–1.030)
Urobilinogen, Ur: 1 mg/dL (ref 0.2–1.0)
pH, UA: 7 (ref 5.0–7.5)

## 2023-11-19 MED ORDER — SULFAMETHOXAZOLE-TRIMETHOPRIM 800-160 MG PO TABS
1.0000 | ORAL_TABLET | Freq: Two times a day (BID) | ORAL | 0 refills | Status: DC
Start: 2023-11-19 — End: 2024-01-21

## 2023-11-19 MED ORDER — TAMSULOSIN HCL 0.4 MG PO CAPS
0.4000 mg | ORAL_CAPSULE | Freq: Every day | ORAL | 3 refills | Status: AC
Start: 2023-11-19 — End: ?

## 2023-11-19 NOTE — Progress Notes (Signed)
 Subjective:    Patient ID: Thomas Ball, male    DOB: [birth year], 62 y.o.   MRN: [id number]   Chief Complaint: Urinary Retention   HPI Patient comes in c/o difficulty urinating. He usually sees Dr. Nadine Ball and she started him on blood pressure meds a year or so ago when this started and it helped for awhile but now he is experiencing it again. Cannot  find specific note about medication and urinary retention. Patient is unsure of the meds he was put on. Think he is talking about cardura. Says that he has trouble getting stream started and it is very weak.  Lab Results  Component Value Date   PSA1 1.0 03/13/2023   PSA1 1.3 01/22/2020   PSA1 1.3 09/09/2018     Patient Active Problem List   Diagnosis Date Noted   Influenza A virus present 10/15/2023   Nasal congestion 10/15/2023   Acute cough 10/15/2023   Body aches 10/15/2023   Low back pain 04/05/2023   URI with cough and congestion 04/05/2023   Tooth abscess 04/05/2023   Long term (current) use of oral hypoglycemic drugs 03/13/2023   Diabetes mellitus type II, controlled (HCC) 03/13/2023   Hyperlipidemia associated with type 2 diabetes mellitus (HCC) 04/29/2021   Panic attacks 12/07/2020   Chest congestion 12/07/2020   Hypertension associated with diabetes (HCC) 04/26/2018   OSA (obstructive sleep apnea) 03/04/2018   BPH (benign prostatic hyperplasia) 10/07/2015   EIC (epidermal inclusion cyst) 07/23/2015   Tobacco abuse 07/02/2015   SVT (supraventricular tachycardia) (HCC)    Intermittent palpitations 04/21/2014   Bradycardia 04/21/2014   Morbid obesity (HCC) 04/21/2014   Cirrhosis of liver (HCC) 11/05/2013   GERD (gastroesophageal reflux disease) 07/18/2012   Screening for colorectal cancer 07/18/2012   Pulmonary nodule 02/14/2012       Review of Systems  Constitutional:  Negative for diaphoresis.  Eyes:  Negative for pain.  Respiratory:  Negative for shortness of breath.   Cardiovascular:  Negative  for chest pain, palpitations and leg swelling.  Gastrointestinal:  Negative for abdominal pain.  Endocrine: Negative for polydipsia.  Skin:  Negative for rash.  Neurological:  Negative for dizziness, weakness and headaches.  Hematological:  Does not bruise/bleed easily.  All other systems reviewed and are negative.      Objective:   Physical Exam Constitutional:      Appearance: Normal appearance. He is obese.  Cardiovascular:     Rate and Rhythm: Normal rate and regular rhythm.     Heart sounds: Normal heart sounds.  Pulmonary:     Effort: Pulmonary effort is normal.     Breath sounds: Normal breath sounds.  Skin:    General: Skin is warm.  Neurological:     General: No focal deficit present.     Mental Status: He is alert and oriented to person, place, and time.  Psychiatric:        Mood and Affect: Mood normal.        Behavior: Behavior normal.    BP 122/71   Pulse (!) 52   Temp (!) 97.1 F (36.2 C) (Temporal)   Ht 5\' 5"  (1.651 m)   Wt (!) 309 lb (140.2 kg)   SpO2 93%   BMI 51.42 kg/m         Assessment & Plan:   Thomas Ball in today with chief complaint of Urinary Retention   1. Difficult or painful urination (Primary)  - Urinalysis, Complete - Urine Culture  2. Acute cystitis without hematuria Take medication as prescribe Cotton underwear Take shower not bath Cranberry juice, yogurt Force fluids AZO over the counter X2 days Culture pending RTO prn  - sulfamethoxazole-trimethoprim (BACTRIM DS) 800-160 MG tablet; Take 1 tablet by mouth 2 (two) times daily.  Dispense: 20 tablet; Refill: 0  3. Urinary retention Referral to urology - tamsulosin (FLOMAX) 0.4 MG CAPS capsule; Take 1 capsule (0.4 mg total) by mouth daily.  Dispense: 30 capsule; Refill: 3 - Ambulatory referral to Urology    The above assessment and management plan was discussed with the patient. The patient verbalized understanding of and has agreed to the management plan.  Patient is aware to call the clinic if symptoms persist or worsen. Patient is aware when to return to the clinic for a follow-up visit. Patient educated on when it is appropriate to go to the emergency department.   Mary-Margaret Daphine Deutscher, FNP

## 2023-11-19 NOTE — Patient Instructions (Signed)
 Trouble Peeing (Acute Urinary Retention) in Males: What to Know Acute urinary retention is when a person can't pee at all or can only pee a little. This can come on all of a sudden. If it's not treated, it can lead to kidney problems or other serious problems. What are the causes? Acute urinary retention may be caused by: A problem with the urethra. This is the tube that drains pee from the bladder. Problems with the nerves in the bladder. Tumors. Some medicines. An infection. Having trouble pooping (constipation). What increases the risk? Older males are more at risk because their prostate gland may get larger as they age. Other health problems can also raise the risk. These include: Multiple sclerosis. Injury to the spinal cord. Diabetes. A condition that affects the way the brain works, such as dementia. Mental health problems. Having urinary retention before. What are the signs or symptoms? Trouble peeing. Pain in the lower belly. How is this treated? Treatment may include: Medicines. Treatment for problems that may cause this. Placing a soft tube called a catheter into the bladder to drain pee out of the body. Therapy to treat mental health problems. If needed, you may be treated in the hospital for kidney problems. Follow these instructions at home: Medicines Take medicines only as told. Do not take any medicine unless your doctor says it's OK. If you were given antibiotics, take them as told. Do not stop taking them even if you start to feel better. General instructions Do not smoke, vape, or use nicotine or tobacco. Drink more fluids as told. If you need to use a catheter, follow the instructions closely. This will help prevent a bladder infection. If told, keep track of changes in your blood pressure at home. Tell your doctor about them. Contact a doctor if: You have bladder cramping, called spasms. You leak pee when you have spasms. You have a fever or chills. You  have blood in your pee. Get help right away if: You can't pee. This information is not intended to replace advice given to you by your health care provider. Make sure you discuss any questions you have with your health care provider. Document Revised: 05/10/2023 Document Reviewed: 05/10/2023 Elsevier Patient Education  2024 [employer].

## 2023-11-19 NOTE — Telephone Encounter (Signed)
 Appointment rescheduled to 9:00 am 12/11/23---will mail information to patient and he voiced his understanding

## 2023-11-19 NOTE — Telephone Encounter (Signed)
 Spoke with patent regarding the 12/11/23 3:00 pm appointment with Rubye Oaks, NP--rescheduled to

## 2023-11-23 LAB — URINE CULTURE

## 2023-11-26 ENCOUNTER — Other Ambulatory Visit: Payer: Self-pay | Admitting: Family Medicine

## 2023-11-30 ENCOUNTER — Telehealth: Payer: Self-pay | Admitting: Adult Health

## 2023-11-30 NOTE — Telephone Encounter (Signed)
 Gave patient sample of this medication, advised pt to discussed alternative

## 2023-11-30 NOTE — Telephone Encounter (Signed)
 Patient is a walk in today and is asking for samples of [employer]

## 2023-12-11 ENCOUNTER — Encounter: Payer: Self-pay | Admitting: Adult Health

## 2023-12-11 ENCOUNTER — Ambulatory Visit: Payer: Self-pay | Admitting: Adult Health

## 2023-12-11 VITALS — BP 116/75 | HR 48 | Ht 65.0 in | Wt 318.0 lb

## 2023-12-11 DIAGNOSIS — J4489 Other specified chronic obstructive pulmonary disease: Secondary | ICD-10-CM

## 2023-12-11 DIAGNOSIS — Z6841 Body Mass Index (BMI) 40.0 and over, adult: Secondary | ICD-10-CM

## 2023-12-11 DIAGNOSIS — F17210 Nicotine dependence, cigarettes, uncomplicated: Secondary | ICD-10-CM

## 2023-12-11 DIAGNOSIS — J209 Acute bronchitis, unspecified: Secondary | ICD-10-CM

## 2023-12-11 DIAGNOSIS — G4733 Obstructive sleep apnea (adult) (pediatric): Secondary | ICD-10-CM

## 2023-12-11 DIAGNOSIS — R911 Solitary pulmonary nodule: Secondary | ICD-10-CM

## 2023-12-11 MED ORDER — FLUTICASONE-SALMETEROL 250-50 MCG/ACT IN AEPB: 1.0000 | INHALATION_SPRAY | Freq: Two times a day (BID) | RESPIRATORY_TRACT | 11 refills | Status: DC

## 2023-12-11 MED ORDER — ALBUTEROL SULFATE (2.5 MG/3ML) 0.083% IN NEBU: 2.5000 mg | INHALATION_SOLUTION | Freq: Four times a day (QID) | RESPIRATORY_TRACT | 5 refills | Status: AC | PRN

## 2023-12-11 NOTE — Patient Instructions (Addendum)
 Change CPAP pressure and mask - may like the Dreamwear Nasal mask.  Wear CPAP At bedtime, wear all night long for at least 6hr or more.   Change to Wixela 250 1 puff Twice daily, rinse after use   Albuterol inhaler or neb As needed   [employer] .  Work on not smoking  Continue with yearly CT chest screening program.   Follow up with Dr. Sherene Sires  or Ryon Layton NP in 3  months and As needed

## 2023-12-11 NOTE — Progress Notes (Signed)
 @Patient  ID: Thomas Ball, male    DOB: 1961-02-06, 63 y.o.   MRN: [id number]  Chief Complaint  Patient presents with   Follow-up   Discussed the use of AI scribe software for clinical note transcription with the patient, who gave verbal consent to proceed.  Referring provider: Raliegh Ip, DO  HPI: 63 year old male active smoker followed for obstructive sleep apnea and COPD, pulmonary nodule Participates in the lung cancer CT screening program Medical history significant for SVT, hypertension, cirrhosis, diabetes, hepatitis C, gallstone pancreatitis, Atrial Fib   TEST/EVENTS :  CT chest May 30, 2023 emphysema, stable at 8.8 mm noncalcified right lower lobe nodule, no new suspicious pulmonary nodules (lung RADS 2 recommend CT chest in 1 year)  2D echo March 30, 2022 EF 55 to 60%, RV SF is normal, RV size is normal right and left atrium mild to moderate dilated  PFT 12/02/18 >> FEV1 2.78 (92%), FEV1% 79, TLC 4.83 (83%), DLCO 97% PFT 11/15/21 >> FEV1 2.45 (84%), FEV1% 77, TLC 5.66 (97%), DLCO 99%  PSG 08/01/17 >> AHI 40.4, SpO2 low 74%   RHC/LHC 10/08/18 >> no CAD, mild pulmonary HTN   12/11/2023 Follow up ; COPD with emphysema, obstructive sleep apnea, pulmonary nodule Patient returns for follow-up visit.  Last seen March 2024.  Patient has underlying severe sleep apnea is on nocturnal CPAP.  He has COPD with emphysema.  Previous PFTs in 2023 showed mild COPD.  Patient also has a right lower lobe nodule that is being followed on serial CT.  CT chest in September 2024 showed stable right lower lobe nodule.  He participates in the lung cancer CT screening program.   He has obstructive sleep apnea and uses CPAP at bedtime but often falls asleep without it, using it for only two to three hours. He feels better when using CPAP but notes it sometimes leaks due to facial hair. CPAP download shows 100% compliance with daily average usage at 3hr . On CPAP auto 5-20cmH2o. Daily avg  pressure at 14cmH2o. AHI 11.8/hr . (Mixed cental and obstructive events) . ++ mask leaks.    He has a history of COPD with emphysema and is an active smoker, currently smoking a pack every three days. He participates in a lung cancer CT screening program, with the most recent CT in September 2024 showing stable emphysema and a noncalcified right lower lobe nodule. Previous PFTs in 2023 indicated mild COPD. He uses Breztri sample for COPD management but finds it expensive. Can not afford Breztri. Does not have insurance. We discussed Good Rx and changing to more affordable inhaler to help with symptom/disease burden. He also uses an albuterol inhaler and nebulizer for symptom relief.  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 He engages in some exercise, including walking and seated exercises, but struggles with weight management. He experiences shortness of breath and foggy-headedness when not using his inhaler. He notes swelling in his feet that resolves with routine diuretic use.      No Known Allergies  Immunization History  Administered Date(s) Administered   Influenza Split 11/22/2012   Influenza, Seasonal, Injecte, Preservative Fre 06/25/2023   Influenza,inj,Quad PF,6+ Mos 07/02/2015, 09/09/2018, 06/28/2021, 10/06/2022   Pneumococcal Polysaccharide-23 11/22/2012   Rabies, IM 02/05/2022, 02/08/2022, 02/12/2022, 02/19/2022   Tdap 06/11/2012, 02/05/2022    Past Medical History:  Diagnosis Date   GERD (gastroesophageal reflux disease)    HCV (hepatitis C virus) DR. ZACKS   AUG 2013 VIRAL LOAD UNDETECTABLE   History of cardiac catheterization    a. 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    Sleep apnea    SVT (supraventricular tachycardia) (HCC)     Tobacco History: Social History   Tobacco Use  Smoking Status Every Day   Current packs/day: 2.00   Average packs/day: 2.0 packs/day for 45.0 years (90.0 ttl pk-yrs)   Types: Cigarettes  Smokeless Tobacco Never  Tobacco Comments   smokes 1-1.5 packs per day   Ready to quit: No Counseling given: Not Answered Tobacco comments: smokes 1-1.5 packs per day   Outpatient Medications Prior to Visit  Medication Sig Dispense Refill   albuterol (PROVENTIL) (2.5 MG/3ML) 0.083% nebulizer solution Take 3 mLs (2.5 mg total) by nebulization every 6 (six) hours as needed for wheezing or shortness of breath. 150 mL 1   albuterol (VENTOLIN HFA) 108 (90 Base) MCG/ACT inhaler Inhale 2 puffs into the lungs every 6 (six) hours as needed for wheezing or shortness of breath. 8 g 2   atorvastatin (LIPITOR) 40 MG tablet Take 1 tablet (40 mg total) by mouth daily. 90 tablet 3   Budeson-Glycopyrrol-Formoterol (BREZTRI AEROSPHERE) 160-9-4.8 MCG/ACT AERO Inhale 2 puffs into the lungs 2 (two) times daily. 10.7 g 11   doxazosin (CARDURA) 2 MG tablet Take 0.5 tablets (1 mg total) by mouth daily. 45 tablet 1   flecainide (TAMBOCOR) 50 MG tablet Take 1 tablet (50 mg total) by mouth 2 (two) times daily.     fluticasone (FLONASE) 50 MCG/ACT nasal spray Place 2 sprays into both nostrils daily. 16 g 0   furosemide (LASIX) 20 MG tablet TAKE ONE (1) TABLET BY MOUTH EVERY DAY 30 tablet 3   metFORMIN (GLUCOPHAGE) 500 MG tablet Take 1 tablet (500 mg total) by mouth 2 (two) times daily. 180 tablet 3   metoprolol succinate (TOPROL XL) 25 MG 24 hr tablet Take 1 tablet (25 mg total) by mouth in the morning and at bedtime. 180 tablet 3   nystatin powder Apply 1 Application topically 3 (three) times daily. X14 per flareup 60 g 0   omeprazole (PRILOSEC) 40 MG capsule TAKE ONE (1) CAPSULE EACH DAY 90 capsule 1   sildenafil (VIAGRA) 100 MG tablet Take 0.5-1 tablets (50-100  mg total) by mouth daily as needed for erectile dysfunction. 5 tablet 11   sulfamethoxazole-trimethoprim (BACTRIM DS) 800-160 MG tablet Take 1 tablet by mouth 2 (two) times daily. 20 tablet 0   tamsulosin (FLOMAX) 0.4 MG CAPS capsule Take 1 capsule (0.4 mg total) by mouth daily. 30 capsule 3   No facility-administered medications prior to visit.     Review of Systems:   Constitutional:   No  weight loss, night sweats,  Fevers, chills,+ fatigue, or  lassitude.  HEENT:   No headaches,  Difficulty swallowing,  Tooth/dental problems, or  Sore throat,                No sneezing, itching, ear ache, nasal congestion, post nasal drip,   CV:  No chest pain,  Orthopnea, PND, swelling in lower extremities, anasarca, dizziness, palpitations, syncope.   GI  No heartburn, indigestion, abdominal pain, nausea, vomiting, diarrhea, change in bowel habits, loss of  appetite, bloody stools.   Resp: No wheezing.  No chest wall deformity  Skin: no rash or lesions.  GU: no dysuria, change in color of urine, no urgency or frequency.  No flank pain, no hematuria   MS:  No joint pain or swelling.  No decreased range of motion.  No back pain.    Physical Exam  BP 116/75   Pulse (!) 48   Ht 5\' 5"  (1.651 m)   Wt (!) 318 lb (144.2 kg)   SpO2 94% Comment: room air  BMI 52.92 kg/m   GEN: A/Ox3; pleasant , NAD, well nourished    HEENT:  [place]/AT, NOSE-clear, THROAT-clear, no lesions, no postnasal drip or exudate noted.   NECK:  Supple w/ fair ROM; no JVD; normal carotid impulses w/o bruits; no thyromegaly or nodules palpated; no lymphadenopathy.    RESP  Clear  P & A; w/o, wheezes/ rales/ or rhonchi. no accessory muscle use, no dullness to percussion  CARD:  RRR, no m/r/g, no peripheral edema, pulses intact, no cyanosis or clubbing.  GI:   Soft & nt; nml bowel sounds; no organomegaly or masses detected.   Musco: Warm bil, no deformities or joint swelling noted.   Neuro: alert, no focal deficits  noted.    Skin: Warm, no lesions or rashes    Lab Results:  CBC    Component Value Date/Time   WBC 6.4 09/21/2023 1514   RBC 4.41 09/21/2023 1514   HGB 13.9 09/21/2023 1514   HGB 12.3 (L) 08/09/2023 1542   HCT 41.5 09/21/2023 1514   HCT 37.9 08/09/2023 1542   PLT 134 (L) 09/21/2023 1514   PLT 125 (L) 08/09/2023 1542   MCV 94.1 09/21/2023 1514   MCV 98 (H) 08/09/2023 1542   MCH 31.5 09/21/2023 1514   MCHC 33.5 09/21/2023 1514   RDW 13.5 09/21/2023 1514   RDW 12.4 08/09/2023 1542   LYMPHSABS 1.3 08/16/2023 0903   LYMPHSABS 1.1 08/09/2023 1542   MONOABS 0.6 08/16/2023 0903   EOSABS 0.1 08/16/2023 0903   EOSABS 0.1 08/09/2023 1542   BASOSABS 0.1 08/16/2023 0903   BASOSABS 0.1 08/09/2023 1542    BMET    Component Value Date/Time   NA 134 (L) 09/21/2023 1514   NA 137 08/21/2023 1421   K 4.0 09/21/2023 1514   CL 100 09/21/2023 1514   CO2 26 09/21/2023 1514   GLUCOSE 123 (H) 09/21/2023 1514   BUN 15 09/21/2023 1514   BUN 23 08/21/2023 1421   CREATININE 0.87 09/21/2023 1514   CREATININE 1.01 06/11/2015 0801   CALCIUM 8.9 09/21/2023 1514   GFRNONAA >60 09/21/2023 1514   GFRAA 106 08/16/2020 0938    BNP    Component Value Date/Time   BNP 182.0 (H) 09/21/2023 1514    ProBNP    Component Value Date/Time   PROBNP 192.0 (H) 03/21/2022 0936    Imaging: No results found.  Administration History     None          Latest Ref Rng & Units 11/15/2021   10:23 AM 12/02/2018    8:55 AM  PFT Results  FVC-Pre L 3.20  3.74   FVC-Predicted Pre % 83  95   FVC-Post L 3.20  3.51   FVC-Predicted Post % 83  89   Pre FEV1/FVC % % 71  74   Post FEV1/FCV % % 77  79   FEV1-Pre L 2.28  2.78   FEV1-Predicted Pre % 78  92   FEV1-Post  L 2.45  2.78   DLCO uncorrected ml/min/mmHg 22.79  22.76   DLCO UNC% % 99  97   DLCO corrected ml/min/mmHg 22.79    DLCO COR %Predicted % 99    DLVA Predicted % 95  94   TLC L 5.66  4.83   TLC % Predicted % 97  83   RV % Predicted % 114   61     No results found for: "NITRICOXIDE"      Assessment & Plan:  Assessment and Plan    Obstructive Sleep Apnea   He has severe obstructive sleep apnea with suboptimal CPAP usage, currently using it for only 3 hours per night. He often falls asleep without the CPAP and uses it late at night. There is mask leakage, likely due to facial hair. The impact of untreated sleep apnea on cardiovascular health, including increased risk for congestive heart failure, atrial fibrillation, and hypertension, was discussed. Consistent CPAP use for at least 6 hours per night is crucial to reduce stress on the body and improve symptoms. Alternative mask options and pressure adjustments were considered to improve efficacy. The plan is to adjust CPAP pressure to auto CPAP 10-18cmH2O to help decrease residual events. , recommend trying a Dreamwear nasal mask to reduce leakage, and instruct him to use CPAP as soon as he gets in the bed . If not improving could send over for CPAP titration .  Follow-up is scheduled in two months to assess CPAP effectiveness.  Chronic Obstructive Pulmonary Disease (COPD) with Emphysema   He has mild COPD with emphysema,  He is an active smoker, currently smoking one pack every three days, and reports recent dyspnea, possibly due to inconsistent use of the Breztri inhaler. The importance of smoking cessation to prevent disease progression was discussed. Exposure to concrete dust may exacerbate symptoms. Cost-effective alternatives to [facility] were considered due to financial constraints. The plan is to switch from [place] to [place] inhaler due to cost concerns, provide a sample of Breztri to use until [place] is obtained, and refill albuterol nebulizer solution. He is encouraged to quit smoking and advised to use an albuterol inhaler or nebulizer for acute dyspnea. A GoodRx coupon for affordable inhaler options was discussed.  Pulmonary Nodule   He has a stable 8.8 mm noncalcified right  lower lobe nodule, monitored through a lung cancer CT screening program. The last CT in September 2024 showed no new suspicious nodules. The plan is to continue annual CT chest scans in September 2025.  Atrial Fibrillation   He has atrial fibrillation, managed with flecainide. Continue follow up with Cardiology .   Hypertension   Hypertension -continue follow up with PCP .    Tobacco abuse -smoking cessation discussed.   Morbid Obesity- BMI 52. Encouraged on healthy diet and exercise .     Rubye Oaks, NP 12/11/2023

## 2024-01-07 ENCOUNTER — Telehealth: Payer: Self-pay | Admitting: Family Medicine

## 2024-01-08 ENCOUNTER — Encounter: Payer: Self-pay | Admitting: Nurse Practitioner

## 2024-01-08 ENCOUNTER — Ambulatory Visit (INDEPENDENT_AMBULATORY_CARE_PROVIDER_SITE_OTHER): Payer: Self-pay | Admitting: Nurse Practitioner

## 2024-01-08 VITALS — BP 104/63 | HR 51 | Temp 97.2°F | Ht 65.0 in | Wt 314.2 lb

## 2024-01-08 DIAGNOSIS — K921 Melena: Secondary | ICD-10-CM | POA: Insufficient documentation

## 2024-01-08 NOTE — Progress Notes (Signed)
 Acute Office Visit  Subjective:     Patient ID: Thomas Ball, male    DOB: 12/10/1960, 63 y.o.   MRN: [id number]  Chief Complaint  Patient presents with   Rectal Bleeding    Woke up this morning and had blood on pillows unsure of where it is coming from     Rectal Bleeding  Pertinent negatives include no fever, no diarrhea, no nausea, no vomiting, no chest pain, no headaches and no coughing.   Thomas Ball 63 year old male present January 08, 2024 for an acute visit concern for melena The patient is a 63 year old male with a past medical history of GERD and internal hemorrhoids who presents for an acute visit due to rectal bleeding. He reports that he woke up this morning and noticed blood on the pillows he sleeps on. He states that he typically sleeps in a recliner without clothing and places two pillows beneath his buttocks. Upon waking, he observed blood on the pillows and suspects it originated from his rectum. He denies any recent trauma or injury. He has a known history of internal hemorrhoids and believes this may be the source. No associated abdominal pain, changes in bowel habits, or constitutional symptoms were reported. Smoke 1/2 pack daily     08/21/2023    1:43 PM 08/09/2023    3:09 PM 06/25/2023    3:01 PM 04/05/2023    3:43 PM  GAD 7 : Generalized Anxiety Score  Nervous, Anxious, on Edge 0 0 0 0  Control/stop worrying 0 0 0 0  Worry too much - different things 0 0 0 0  Trouble relaxing 0 0 0 0  Restless 0 0 0 0  Easily annoyed or irritable 0 0 0 0  Afraid - awful might happen 0 0 0 0  Total GAD 7 Score 0 0 0 0  Anxiety Difficulty Not difficult at all Not difficult at all Not difficult at all Not difficult at all     [facility] Visit from 08/21/2023 in [facility]  PHQ-9 Total Score 0       Active Ambulatory Problems    Diagnosis Date Noted   Pulmonary nodule 02/14/2012   GERD (gastroesophageal reflux  disease) 07/18/2012   Screening for colorectal cancer 07/18/2012   Cirrhosis of liver (HCC) 11/05/2013   Intermittent palpitations 04/21/2014   Bradycardia 04/21/2014   Morbid obesity (HCC) 04/21/2014   SVT (supraventricular tachycardia) (HCC)    Tobacco abuse 07/02/2015   EIC (epidermal inclusion cyst) 07/23/2015   BPH (benign prostatic hyperplasia) 10/07/2015   OSA (obstructive sleep apnea) 03/04/2018   Hypertension associated with diabetes (HCC) 04/26/2018   Panic attacks 12/07/2020   Chest congestion 12/07/2020   Hyperlipidemia associated with type 2 diabetes mellitus (HCC) 04/29/2021   Long term (current) use of oral hypoglycemic drugs 03/13/2023   Diabetes mellitus type II, controlled (HCC) 03/13/2023   Low back pain 04/05/2023   URI with cough and congestion 04/05/2023   Tooth abscess 04/05/2023   Influenza A virus present 10/15/2023   Nasal congestion 10/15/2023   Acute cough 10/15/2023   Body aches 10/15/2023   Blood in stool 01/08/2024   Resolved Ambulatory Problems    Diagnosis Date Noted   Hepatitis C, chronic (HCC) 12/12/2010   Hepatitis C 01/21/2012   Chest pain 04/20/2014   Hypotension 04/21/2014   Cough 09/17/2015   SOB (shortness of breath) 09/15/2018   Dyspnea    Pure hypercholesterolemia 01/12/2020  Headache in front of head 12/07/2020   Uncontrolled type 2 diabetes mellitus with hyperglycemia (HCC) 04/29/2021   Past Medical History:  Diagnosis Date   HCV (hepatitis C virus) DR. ZACKS   History of cardiac catheterization    Hypercholesteremia    Hypertension    Pancreatitis, gallstone    Skin cancer 1992   Sleep apnea     Review of Systems  Constitutional:  Negative for chills and fever.  Respiratory:  Negative for cough, shortness of breath and wheezing.   Cardiovascular:  Negative for chest pain and leg swelling.  Gastrointestinal:  Positive for blood in stool and hematochezia. Negative for constipation, diarrhea, melena, nausea and vomiting.        On pillows  Neurological:  Negative for dizziness and headaches.   Negative unless indicated in HPI    Objective:    BP 104/63   Pulse (!) 51   Temp (!) 97.2 F (36.2 C) (Temporal)   Ht 5\' 5"  (1.651 m)   Wt (!) 314 lb 3.2 oz (142.5 kg)   SpO2 95%   BMI 52.29 kg/m  BP Readings from Last 3 Encounters:  01/08/24 104/63  12/11/23 116/75  11/19/23 122/71   Wt Readings from Last 3 Encounters:  01/08/24 (!) 314 lb 3.2 oz (142.5 kg)  12/11/23 (!) 318 lb (144.2 kg)  11/19/23 (!) 309 lb (140.2 kg)      Physical Exam Vitals and nursing note reviewed. Exam conducted with a chaperone present [employer]).  Constitutional:      General: He is not in acute distress.    Appearance: He is obese.  HENT:     Head: Normocephalic and atraumatic.     Nose: Nose normal.  Eyes:     General: No scleral icterus.    Extraocular Movements: Extraocular movements intact.     Conjunctiva/sclera: Conjunctivae normal.     Pupils: Pupils are equal, round, and reactive to light.  Abdominal:     General: Bowel sounds are normal.     Comments: Abdominal fold  Genitourinary:    Rectum: Normal. No tenderness, anal fissure or external hemorrhoid. Normal anal tone.  Skin:    General: Skin is warm and dry.     Findings: No rash.  Neurological:     Mental Status: He is alert and oriented to person, place, and time.  Psychiatric:        Mood and Affect: Mood normal.        Behavior: Behavior normal.        Thought Content: Thought content normal.        Judgment: Judgment normal.     No results found for any visits on 01/08/24.      Assessment & Plan:  Blood in stool -     Fecal occult blood, imunochemical  Thomas Ball is 63 year old Caucasian male seen today for blood in stool, no acute distress Blood in stool: FOBT ordered, advised to collect sample and to bring it back to the lab to be processed  Encourage healthy lifestyle choices, including diet (rich in fruits, vegetables, and  lean proteins, and low in salt and simple carbohydrates) and exercise (at least 30 minutes of moderate physical activity daily).     The above assessment and management plan was discussed with the patient. The patient verbalized understanding of and has agreed to the management plan. Patient is aware to call the clinic if they develop any new symptoms or if symptoms persist or worsen. Patient is aware  when to return to the clinic for a follow-up visit. Patient educated on when it is appropriate to go to the emergency department.  Return if symptoms worsen or fail to improve.  Shawni Volkov [place], DNP [facility] [address] [place], [place] [address] [phone]  Note: This document was prepared by [employer] voice dictation technology and any errors that results from this process are unintentional.

## 2024-01-09 ENCOUNTER — Other Ambulatory Visit: Payer: Self-pay

## 2024-01-10 LAB — FECAL OCCULT BLOOD, IMMUNOCHEMICAL: Fecal Occult Bld: NEGATIVE

## 2024-01-21 ENCOUNTER — Encounter: Payer: Self-pay | Admitting: Family Medicine

## 2024-01-21 ENCOUNTER — Ambulatory Visit (INDEPENDENT_AMBULATORY_CARE_PROVIDER_SITE_OTHER): Payer: Self-pay | Admitting: Family Medicine

## 2024-01-21 VITALS — BP 102/58 | HR 49 | Temp 98.2°F | Ht 65.0 in | Wt 318.0 lb

## 2024-01-21 DIAGNOSIS — K921 Melena: Secondary | ICD-10-CM

## 2024-01-21 DIAGNOSIS — E1159 Type 2 diabetes mellitus with other circulatory complications: Secondary | ICD-10-CM

## 2024-01-21 DIAGNOSIS — I152 Hypertension secondary to endocrine disorders: Secondary | ICD-10-CM

## 2024-01-21 DIAGNOSIS — E119 Type 2 diabetes mellitus without complications: Secondary | ICD-10-CM

## 2024-01-21 DIAGNOSIS — B372 Candidiasis of skin and nail: Secondary | ICD-10-CM

## 2024-01-21 DIAGNOSIS — Z7984 Long term (current) use of oral hypoglycemic drugs: Secondary | ICD-10-CM

## 2024-01-21 DIAGNOSIS — Z1211 Encounter for screening for malignant neoplasm of colon: Secondary | ICD-10-CM

## 2024-01-21 DIAGNOSIS — Z0001 Encounter for general adult medical examination with abnormal findings: Secondary | ICD-10-CM

## 2024-01-21 DIAGNOSIS — Z23 Encounter for immunization: Secondary | ICD-10-CM

## 2024-01-21 DIAGNOSIS — E1169 Type 2 diabetes mellitus with other specified complication: Secondary | ICD-10-CM

## 2024-01-21 DIAGNOSIS — K7469 Other cirrhosis of liver: Secondary | ICD-10-CM

## 2024-01-21 DIAGNOSIS — Z6841 Body Mass Index (BMI) 40.0 and over, adult: Secondary | ICD-10-CM | POA: Insufficient documentation

## 2024-01-21 DIAGNOSIS — Z1212 Encounter for screening for malignant neoplasm of rectum: Secondary | ICD-10-CM

## 2024-01-21 DIAGNOSIS — Z0000 Encounter for general adult medical examination without abnormal findings: Secondary | ICD-10-CM

## 2024-01-21 LAB — LIPID PANEL

## 2024-01-21 LAB — BAYER DCA HB A1C WAIVED: HB A1C (BAYER DCA - WAIVED): 6.6 % — ABNORMAL HIGH (ref 4.8–5.6)

## 2024-01-21 MED ORDER — DOXAZOSIN MESYLATE 2 MG PO TABS: 1.0000 mg | ORAL_TABLET | Freq: Every day | ORAL | 3 refills | Status: AC

## 2024-01-21 MED ORDER — METFORMIN HCL 500 MG PO TABS: 500.0000 mg | ORAL_TABLET | Freq: Two times a day (BID) | ORAL | 3 refills | Status: DC

## 2024-01-21 MED ORDER — ATORVASTATIN CALCIUM 40 MG PO TABS: 40.0000 mg | ORAL_TABLET | Freq: Every day | ORAL | 3 refills | Status: AC

## 2024-01-21 MED ORDER — NYSTATIN 100000 UNIT/GM EX POWD: 1.0000 | Freq: Three times a day (TID) | CUTANEOUS | 0 refills | Status: DC

## 2024-01-21 NOTE — Addendum Note (Signed)
 Addended by: Curvin Downing on: 01/21/2024 09:46 AM   Modules accepted: Orders

## 2024-01-21 NOTE — Progress Notes (Signed)
 Thomas Ball is a 63 y.o. male presents to office today for annual physical exam examination.    Concerns today include: 1. Type 2 Diabetes with hypertension, hyperlipidemia:  Patient reports compliance with all medications.  He drinks diet Cokes every day, does not follow a strict diet but does try to switch simple carbs to complex carbs.  He admits to eating a lot of Posta.  He gets about 9-11,000 steps per day.  He also reports eating [employer] cups.  He does have a goal of weight loss.  Last eye exam: needs Last foot exam: UTD Last A1c:  Lab Results  Component Value Date   HGBA1C 7.0 (H) 09/21/2023   Nephropathy screen indicated?: UTD Last flu, zoster and/or pneumovax:  Immunization History  Administered Date(s) Administered   Influenza Split 11/22/2012   Influenza, Seasonal, Injecte, Preservative Fre 06/25/2023   Influenza,inj,Quad PF,6+ Mos 07/02/2015, 09/09/2018, 06/28/2021, 10/06/2022   Pneumococcal Polysaccharide-23 11/22/2012   Rabies, IM 02/05/2022, 02/08/2022, 02/12/2022, 02/19/2022   Tdap 06/11/2012, 02/05/2022    ROS: Denies chest pain, shortness of breath, falls.  2.?  Bloody stool He was evaluated a couple of days ago for suspected bloody stool.  He sits in a chair to sleep at nighttime and when he woke up there was a pool of blood underneath him.  He assumed this was rectal bleeding but had FOBT which was negative.  He has not reported any bleeding since but does note that he did have a lesion on the scrotum that he picked at.  At the time it was not bleeding but he says that this could have been the area that caused the bleeding.  He is not on any blood thinners but does take daily aspirin .  Is overdue for colonoscopy anyway so wants a referral back to Dr. Cherisse Cornell office   Marital status: Has a girlfriend Health Maintenance Due  Topic Date Due   Zoster Vaccines- Shingrix (1 of 2) Never done   Pneumococcal Vaccine 50-53 Years old (2 of 2 - PCV) 11/22/2013    Colonoscopy  10/04/2022   OPHTHALMOLOGY EXAM  08/15/2023   Refills needed today: none  Immunization History  Administered Date(s) Administered   Influenza Split 11/22/2012   Influenza, Seasonal, Injecte, Preservative Fre 06/25/2023   Influenza,inj,Quad PF,6+ Mos 07/02/2015, 09/09/2018, 06/28/2021, 10/06/2022   Pneumococcal Polysaccharide-23 11/22/2012   Rabies, IM 02/05/2022, 02/08/2022, 02/12/2022, 02/19/2022   Tdap 06/11/2012, 02/05/2022   Past Medical History:  Diagnosis Date   GERD (gastroesophageal reflux disease)    HCV (hepatitis C virus) DR. ZACKS   AUG 2013 VIRAL LOAD UNDETECTABLE   History of cardiac catheterization    a. 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   Sleep apnea    SVT (supraventricular tachycardia) (HCC)    Social History   Socioeconomic History   Marital status: Widowed    Spouse name: Not on file   Number of children: Not on file   Years of education: Not on file   Highest education level: Not on file  Occupational History   Occupation: concrete work    Associate Professor: SELF-EMPLOYED  Tobacco Use   Smoking status: Every Day    Current packs/day: 2.00    Average packs/day: 2.0 packs/day for 45.0 years (90.0 ttl pk-yrs)    Types: Cigarettes   Smokeless tobacco: Never  Tobacco comments:    smokes 1-1.5 packs per day  Vaping Use   Vaping status: Never Used  Substance and Sexual Activity   Alcohol use: No   Drug use: No    Comment: in the 1980s, smoke crack. None since Dec 2011.    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   CHOLECYSTECTOMY     COLONOSCOPY  10/04/2012   SLF:ONE/8 SIMPLE ADENOMA, TICS, SML IH   ESOPHAGOGASTRODUODENOSCOPY N/A 09/04/2014   Procedure: ESOPHAGOGASTRODUODENOSCOPY (EGD);  Surgeon: Alyce Jubilee, MD;  Location: [facility];  Service: Endoscopy;  Laterality: N/A;  145   GALLBLADDER SURGERY     JAW BROKEN     LIPOMA REMOVALS     SEVERAL   RIGHT/LEFT HEART CATH AND CORONARY ANGIOGRAPHY N/A 10/08/2018   Procedure: RIGHT/LEFT HEART CATH AND CORONARY ANGIOGRAPHY;  Surgeon: Swaziland, Peter M, MD;  Location: [facility] INVASIVE CV LAB;  Service: Cardiovascular;  Laterality: N/A;   SUPRAVENTRICULAR TACHYCARDIA ABLATION N/A 06/22/2014   Procedure: SUPRAVENTRICULAR TACHYCARDIA ABLATION;  Surgeon: Tammie Fall, MD;  Location: [facility] CATH LAB;  Service: Cardiovascular;  Laterality: N/A;   Family History  Problem Relation Age of Onset   Emphysema Mother    COPD Mother    Early death Mother    Hyperlipidemia Father    Stroke Father    Diabetes Father    High Cholesterol Father    Colon cancer Neg Hx     Current Outpatient Medications:    albuterol  (PROVENTIL ) (2.5 MG/3ML) 0.083% nebulizer solution, Take 3 mLs (2.5 mg total) by nebulization every 6 (six) hours as needed for wheezing or shortness of breath., Disp: 150 mL, Rfl: 5   albuterol  (VENTOLIN  HFA) 108 (90 Base) MCG/ACT inhaler, Inhale 2 puffs into the lungs every 6 (six) hours as needed for wheezing or shortness of breath., Disp: 8 g, Rfl: 2   doxazosin  (CARDURA ) 2 MG tablet, Take 0.5 tablets (1 mg total) by mouth daily., Disp: 45 tablet, Rfl: 1   flecainide  (TAMBOCOR ) 50 MG tablet, Take 1 tablet (50 mg total) by mouth 2 (two) times daily., Disp: , Rfl:    fluticasone  (FLONASE ) 50 MCG/ACT nasal spray, Place 2 sprays into both nostrils daily., Disp: 16 g, Rfl: 0   fluticasone -salmeterol (WIXELA INHUB)  250-50 MCG/ACT AEPB, Inhale 1 puff into the lungs in the morning and at bedtime., Disp: 1 each, Rfl: 11   furosemide  (LASIX ) 20 MG tablet, TAKE ONE (1) TABLET BY MOUTH EVERY DAY, Disp: 30 tablet, Rfl: 3   metoprolol  succinate (TOPROL  XL) 25 MG 24 hr tablet, Take 1 tablet (25 mg total) by mouth in the morning and at bedtime., Disp: 180 tablet, Rfl: 3   nystatin  powder, Apply 1 Application topically 3 (three) times daily. X14 per flareup, Disp: 60 g, Rfl: 0   omeprazole  (PRILOSEC) 40 MG capsule, TAKE ONE (1) CAPSULE EACH DAY, Disp: 90 capsule, Rfl: 1   sildenafil  (VIAGRA ) 100 MG tablet, Take 0.5-1 tablets (50-100 mg total) by mouth  daily as needed for erectile dysfunction., Disp: 5 tablet, Rfl: 11   sulfamethoxazole -trimethoprim  (BACTRIM  DS) 800-160 MG tablet, Take 1 tablet by mouth 2 (two) times daily., Disp: 20 tablet, Rfl: 0   tamsulosin  (FLOMAX ) 0.4 MG CAPS capsule, Take 1 capsule (0.4 mg total) by mouth daily., Disp: 30 capsule, Rfl: 3   atorvastatin  (LIPITOR) 40 MG tablet, Take 1 tablet (40 mg total) by mouth daily., Disp: 90 tablet, Rfl: 3   metFORMIN  (GLUCOPHAGE ) 500 MG tablet, Take 1 tablet (500 mg total) by mouth 2 (two) times daily., Disp: 180 tablet, Rfl: 3  No Known Allergies   ROS: Review of Systems Pertinent items noted in HPI and remainder of comprehensive ROS otherwise negative.    Physical exam BP (!) 102/58   Pulse (!) 49   Temp 98.2 F (36.8 C)   Ht 5\' 5"  (1.651 m)   Wt (!) 318 lb (144.2 kg)   SpO2 96%   BMI 52.92 kg/m  General appearance: alert, cooperative, appears stated age, no distress, and morbidly obese Head: Normocephalic, without obvious abnormality, atraumatic Eyes: negative findings: lids and lashes normal, conjunctivae and sclerae normal, corneas clear, and pupils equal, round, reactive to light and accomodation Ears: normal TM's and external ear canals both ears Nose: Nares normal. Septum midline. Mucosa normal. No drainage or sinus tenderness. Throat:   Oropharynx without erythema or masses Neck: no adenopathy, supple, symmetrical, trachea midline, and thyroid  not enlarged, symmetric, no tenderness/mass/nodules Back: symmetric, no curvature. ROM normal. No CVA tenderness. Lungs: clear to auscultation bilaterally Chest wall: no tenderness Heart: regular rate and rhythm, S1, S2 normal, no murmur, click, rub or gallop Abdomen:  Morbidly obese abdomen which causes the exam to be technically difficult.  It is soft, nontender and there is no appreciable hepatosplenomegaly or masses Male genitalia: penis: no lesions or discharge. testes: no masses or tenderness. no hernias, he has a small scab noted at the base of the scrotum but there is no appreciable fluctuance, induration, palpable masses. Extremities: extremities normal, atraumatic, no cyanosis or edema Pulses: 2+ and symmetric Skin: Skin color, texture, turgor normal. No rashes or lesions Lymph nodes: Cervical, supraclavicular, and axillary nodes normal. Neurologic: Grossly normal      01/21/2024    8:57 AM 11/19/2023    2:32 PM 08/21/2023    1:44 PM  Depression screen PHQ 2/9  Decreased Interest 0 0 0  Down, Depressed, Hopeless 0 0 0  PHQ - 2 Score 0 0 0  Altered sleeping 0  0  Tired, decreased energy 0  0  Change in appetite 0  0  Feeling bad or failure about yourself  0  0  Trouble concentrating 0  0  Moving slowly or fidgety/restless 0  0  Suicidal thoughts 0  0  PHQ-9 Score 0  0  Difficult doing work/chores Not difficult at all  Not difficult at all      01/21/2024    8:57 AM 08/21/2023    1:43 PM 08/09/2023    3:09 PM 06/25/2023    3:01 PM  GAD 7 : Generalized Anxiety Score  Nervous, Anxious, on Edge 0 0 0 0  Control/stop worrying 0 0 0 0  Worry too much - different things 0 0 0 0  Trouble relaxing 0 0 0 0  Restless 0 0 0 0  Easily annoyed or irritable 0 0 0 0  Afraid - awful might happen 0 0 0 0  Total GAD 7 Score 0 0 0 0  Anxiety Difficulty Not difficult at all Not  difficult at all Not difficult at all Not difficult at all     Assessment/ Plan: Thomas Ball here for annual physical exam.   Annual physical exam  Screening for colorectal cancer - Plan: Ambulatory referral to Gastroenterology  Hematochezia - Plan: Ambulatory referral to Gastroenterology, CBC  Diabetes mellitus treated with oral medication (HCC) - Plan: CBC, CMP14+EGFR, Bayer DCA Hb A1c Waived, metFORMIN  (GLUCOPHAGE ) 500 MG tablet, AMB Referral [facility]BCI Care Management, Amb ref to Medical Nutrition Therapy-MNT  Hyperlipidemia associated with type 2 diabetes mellitus (HCC) - Plan: CMP14+EGFR, Lipid Panel, atorvastatin  (LIPITOR) 40 MG tablet, Amb ref to Medical Nutrition Therapy-MNT  Hypertension associated with diabetes (HCC) - Plan: CMP14+EGFR, Amb ref to Medical Nutrition Therapy-MNT  Other cirrhosis of liver (HCC) - Plan: CBC, CMP14+EGFR, Amb ref to Medical Nutrition Therapy-MNT  Morbid obesity (HCC) - Plan: CMP14+EGFR, VITAMIN D 25 Hydroxy (Vit-D Deficiency, Fractures), Amb ref to Medical Nutrition Therapy-MNT  BMI 50.0-59.9, adult (HCC) - Plan: CMP14+EGFR, Amb ref to Medical Nutrition Therapy-MNT  Candidal intertrigo - Plan: nystatin  powder  A1c down to 6.6 today.  I referred him to nutrition for counseling.  I am going to also refer him to our clinical pharmacist to see if maybe we can get him set up with Ozempic.  Referral to gastroenterology for colon cancer screening and suspected hematochezia.  Though it is quite possible that the bleeding came from the scrotum.  His medications have been renewed.  We will plan for diabetic foot exam, urine microscopy at next visit  Blood pressure well-controlled.  Check LFTs, CBC given history of cirrhosis  Again reinforced need for weight reduction and we discussed ways to do this today.  Hopefully we can get him qualified for GLP through patient assistance  No active candidal intertrigo but nystatin  renewed  Counseled on healthy  lifestyle choices, including diet (rich in fruits, vegetables and lean meats and low in salt and simple carbohydrates) and exercise (at least 30 minutes of moderate physical activity daily).  Patient to follow up 77m for DM/ shingrix #2  Cerina Leary M. Bonnell Butcher, DO

## 2024-01-22 ENCOUNTER — Other Ambulatory Visit: Payer: Self-pay | Admitting: Family Medicine

## 2024-01-22 ENCOUNTER — Encounter: Payer: Self-pay | Admitting: Family Medicine

## 2024-01-22 DIAGNOSIS — E559 Vitamin D deficiency, unspecified: Secondary | ICD-10-CM

## 2024-01-22 LAB — CBC
Hematocrit: 40.8 % (ref 37.5–51.0)
Hemoglobin: 13.4 g/dL (ref 13.0–17.7)
MCH: 31.6 pg (ref 26.6–33.0)
MCHC: 32.8 g/dL (ref 31.5–35.7)
MCV: 96 fL (ref 79–97)
Platelets: 140 10*3/uL — ABNORMAL LOW (ref 150–450)
RBC: 4.24 x10E6/uL (ref 4.14–5.80)
RDW: 12.7 % (ref 11.6–15.4)
WBC: 5.1 10*3/uL (ref 3.4–10.8)

## 2024-01-22 LAB — CMP14+EGFR
ALT: 21 IU/L (ref 0–44)
AST: 17 IU/L (ref 0–40)
Albumin: 4.1 g/dL (ref 3.9–4.9)
Alkaline Phosphatase: 31 IU/L — ABNORMAL LOW (ref 44–121)
BUN/Creatinine Ratio: 20 (ref 10–24)
BUN: 17 mg/dL (ref 8–27)
Bilirubin Total: 0.9 mg/dL (ref 0.0–1.2)
CO2: 26 mmol/L (ref 20–29)
Calcium: 9.4 mg/dL (ref 8.6–10.2)
Chloride: 101 mmol/L (ref 96–106)
Creatinine, Ser: 0.85 mg/dL (ref 0.76–1.27)
Globulin, Total: 2.2 g/dL (ref 1.5–4.5)
Glucose: 141 mg/dL — ABNORMAL HIGH (ref 70–99)
Potassium: 5.3 mmol/L — ABNORMAL HIGH (ref 3.5–5.2)
Sodium: 138 mmol/L (ref 134–144)
Total Protein: 6.3 g/dL (ref 6.0–8.5)
eGFR: 99 mL/min/{1.73_m2} (ref >59)

## 2024-01-22 LAB — LIPID PANEL
Cholesterol, Total: 97 mg/dL — ABNORMAL LOW (ref 100–199)
HDL: 41 mg/dL (ref >39)
LDL CALC COMMENT:: 2.4 ratio (ref 0.0–5.0)
LDL Chol Calc (NIH): 39 mg/dL (ref 0–99)
Triglycerides: 82 mg/dL (ref 0–149)
VLDL Cholesterol Cal: 17 mg/dL (ref 5–40)

## 2024-01-22 LAB — VITAMIN D 25 HYDROXY (VIT D DEFICIENCY, FRACTURES): Vit D, 25-Hydroxy: 13.6 ng/mL — ABNORMAL LOW (ref 30.0–100.0)

## 2024-01-22 MED ORDER — VITAMIN D (ERGOCALCIFEROL) 1.25 MG (50000 UNIT) PO CAPS: 50000.0000 [IU] | ORAL_CAPSULE | ORAL | 3 refills | Status: DC

## 2024-01-24 ENCOUNTER — Encounter: Payer: Self-pay | Admitting: *Deleted

## 2024-01-28 ENCOUNTER — Telehealth: Payer: Self-pay

## 2024-01-28 NOTE — Progress Notes (Signed)
 Care Guide Pharmacy Note  01/28/2024 Name: [name] MRN: [id number] DOB: [birth year]  Referred By: Eliodoro Guerin, DO Reason for referral: Complex Care Management (Outreach to schedule with Pharm d )   Thomas Ball is a 63 y.o. year old male who is a primary care patient of Eliodoro Guerin, DO.  [name] was referred to the pharmacist for assistance related to: DMII  Successful contact was made with the patient to discuss pharmacy services including being ready for the pharmacist to call at least 5 minutes before the scheduled appointment time and to have medication bottles and any blood pressure readings ready for review. The patient agreed to meet with the pharmacist via telephone visit on (date/time).02/20/2024  Lenton Rail , RMA     [place]  [facility], [facility] Guide  Direct Dial: [phone]  Website: [email or web address]

## 2024-02-14 ENCOUNTER — Ambulatory Visit: Payer: Self-pay | Admitting: Internal Medicine

## 2024-02-20 ENCOUNTER — Telehealth: Payer: Self-pay | Admitting: Pharmacist

## 2024-02-20 ENCOUNTER — Other Ambulatory Visit: Payer: Self-pay | Admitting: Pharmacist

## 2024-02-20 DIAGNOSIS — Z7985 Long-term (current) use of injectable non-insulin antidiabetic drugs: Secondary | ICD-10-CM

## 2024-02-20 DIAGNOSIS — E119 Type 2 diabetes mellitus without complications: Secondary | ICD-10-CM

## 2024-02-20 NOTE — Telephone Encounter (Signed)
 Need breztri  and ozempic PAP No insurance Okay to route me entire PAPs

## 2024-02-20 NOTE — Progress Notes (Signed)
   02/20/2024 Name: Thomas Ball MRN: [id number] DOB: Jan 12, 1961  Chief Complaint  Patient presents with   Diabetes   COPD    Thomas Ball is a 63 y.o. year old male who presented for a telephone visit.   They were referred to the pharmacist by their PCP for assistance in managing diabetes and medication access.    Subjective:  Care Team: Primary Care Provider: Eliodoro Guerin, DO    Medication Access/Adherence  Current Pharmacy:  [facility] - [place], [place] - [address] [place] [address] Phone: [phone] Fax: [phone]   Patient reports affordability concerns with their medications: Yes  Patient reports access/transportation concerns to their pharmacy: No  Patient reports adherence concerns with their medications:  Yes  due to cost   Diabetes:  Current medications: metformin , new start ozempic  (will need PAP) Medications tried in the past: n/a  Current glucose readings: n/a  Current meal patterns:  Discussed meal planning options and Plate method for healthy eating Avoid sugary drinks and desserts Incorporate balanced protein, non starchy veggies, 1 serving of carbohydrate with each meal Increase water  intake Increase physical activity as able  Current physical activity: encouraged as able  Current medication access support: no insurance; will need PAP   Objective:  Lab Results  Component Value Date   HGBA1C 6.6 (H) 01/21/2024    Lab Results  Component Value Date   CREATININE 0.85 01/21/2024   BUN 17 01/21/2024   NA 138 01/21/2024   K 5.3 (H) 01/21/2024   CL 101 01/21/2024   CO2 26 01/21/2024    Lab Results  Component Value Date   CHOL 97 (L) 01/21/2024   HDL 41 01/21/2024   LDLCALC 39 01/21/2024   TRIG 82 01/21/2024   CHOLHDL 2.4 01/21/2024    Medications Reviewed Today   Medications were not reviewed in this encounter       Assessment/Plan:   Diabetes: - Currently controlled,  but patient would like additional control and weight loss/cardiac support that a GLP1 medication (Ozempic ) can offer - Reviewed long term cardiovascular and renal outcomes of uncontrolled blood sugar - Reviewed goal A1c, goal fasting, and goal 2 hour post prandial glucose - Reviewed dietary modifications including FOLLOWING A HEART HEALTHY DIET/HEALTHY PLATE METHOD - Recommend to  Start Ozemipc 0.25mg  weekly once patient assistance obtained via novo nordisk PAP Continue metformin  for now  - Patient denies personal or family history of multiple endocrine neoplasia type 2, medullary thyroid  cancer; personal history of pancreatitis or gallbladder disease. - Recommend to check glucose daily (fasting) or if symptomatic - Meets financial criteria for Ozempic  patient assistance program through novo nordisk. Will collaborate with provider, CPhT, and patient to pursue assistance.  -Will also obtain Breztri  inhaler for COPD --currently paying for Wixela and could benefit from triple therapy.  He has back up albuterol  generic at home; No ED admissions due to COPD  Follow Up Plan: 1 month  Thomas Ball, PharmD, BCACP, CPP Clinical Pharmacist, [facility]

## 2024-02-21 ENCOUNTER — Telehealth: Payer: Self-pay

## 2024-02-21 ENCOUNTER — Other Ambulatory Visit (HOSPITAL_COMMUNITY): Payer: Self-pay

## 2024-02-21 MED ORDER — OZEMPIC (0.25 OR 0.5 MG/DOSE) 2 MG/3ML ~~LOC~~ SOPN: 0.2500 mg | PEN_INJECTOR | SUBCUTANEOUS | Status: DC

## 2024-02-21 MED ORDER — BREZTRI AEROSPHERE 160-9-4.8 MCG/ACT IN AERO: 2.0000 | INHALATION_SPRAY | Freq: Two times a day (BID) | RESPIRATORY_TRACT | 4 refills | Status: DC

## 2024-02-21 NOTE — Telephone Encounter (Signed)
 PAP: Patient assistance application for Ozempic  through Novo Nordisk has been SENT TO JULIE PRUITT FOR FURTHER PROCESSING FOR PT

## 2024-02-21 NOTE — Telephone Encounter (Signed)
 PAP: Patient assistance application for Breztri  through [employer] (AZ&Me) has been SENT TO JULIE PRUITT FOR FURTHER PROCESSING FOR PT.

## 2024-02-21 NOTE — Progress Notes (Addendum)
 Pharmacy Medication Assistance Program Note     03/19/2024  Patient ID: Thomas Ball, male  DOB: 1961-06-11, 63 y.o.  MRN:  [id number]     02/21/2024  Outreach Medication Two  Initial Outreach Date (Medication Two) 02/21/2024  Manufacturer Medication Two Novo Nordisk  Nordisk Drugs Ozempic   Dose of Ozempic  0.5MG   Type of Journalist, newspaper to [employer] Fax  Date Application Submitted to Manufacturer 02/26/2024  Patient Assistance Determination Approved  Approval Start Date 03/17/2024

## 2024-02-21 NOTE — Addendum Note (Signed)
 Addended by: Houa Nie D on: 02/21/2024 07:27 PM   Modules accepted: Orders

## 2024-02-21 NOTE — Telephone Encounter (Signed)
 Dr. Bonnell Butcher is PCP/prescriber Both are new starts Added to med list

## 2024-02-22 ENCOUNTER — Other Ambulatory Visit: Payer: Self-pay | Admitting: Family Medicine

## 2024-02-22 NOTE — Progress Notes (Addendum)
 Pharmacy Medication Assistance Program Note    02/26/2024  Patient ID: Thomas Ball, male  DOB: 12-Sep-1961, 63 y.o.  MRN:  [id number]     02/22/2024  Outreach Medication Three  Manufacturer Medication Three [employer]  [employer] Drugs Bretztri  Type of Radiographer, therapeutic Assistance  Date Application Sent to Prescriber 02/21/2024  Date Application Received From Provider 02/22/2024  Date Application Submitted to Manufacturer 02/26/2024  Method Application Sent to Manufacturer Fax    SUBMITTED - 2ND ATTEMPT  Patient previously denied 08/31/23 due to possible LIS eligibility.

## 2024-02-26 ENCOUNTER — Other Ambulatory Visit (HOSPITAL_COMMUNITY): Payer: Self-pay

## 2024-02-27 ENCOUNTER — Telehealth: Payer: Self-pay | Admitting: Family Medicine

## 2024-03-03 NOTE — Telephone Encounter (Signed)
 Faxed addtl info to Nov[employer]isk.

## 2024-03-05 ENCOUNTER — Ambulatory Visit: Payer: Self-pay | Admitting: Internal Medicine

## 2024-03-06 ENCOUNTER — Telehealth: Payer: Self-pay | Admitting: Nutrition

## 2024-03-06 ENCOUNTER — Ambulatory Visit: Payer: Self-pay | Admitting: Nutrition

## 2024-03-06 NOTE — Telephone Encounter (Signed)
 VM to call back to r/s missed appt.

## 2024-03-06 NOTE — Progress Notes (Signed)
 Pharmacy Medication Assistance Program Note    03/06/2024  Patient ID: Thomas Ball, male  DOB: 05-27-1961, 63 y.o.  MRN:  [id number]     02/22/2024  Outreach Medication Three  Manufacturer Medication Three [employer]  [employer] Drugs Bretztri  Type of Radiographer, therapeutic Assistance  Date Application Sent to Prescriber 02/21/2024  Date Application Received From Provider 02/22/2024  Date Application Submitted to Manufacturer 02/26/2024  Method Application Sent to Manufacturer Fax  Patient Assistance Determination Denied  Patient Denial Reasons Over Income     DENIED:

## 2024-03-10 ENCOUNTER — Other Ambulatory Visit: Payer: Self-pay | Admitting: Family Medicine

## 2024-03-10 ENCOUNTER — Other Ambulatory Visit: Payer: Self-pay | Admitting: Nurse Practitioner

## 2024-03-10 DIAGNOSIS — I5033 Acute on chronic diastolic (congestive) heart failure: Secondary | ICD-10-CM

## 2024-03-10 DIAGNOSIS — R339 Retention of urine, unspecified: Secondary | ICD-10-CM

## 2024-03-11 ENCOUNTER — Telehealth: Payer: Self-pay | Admitting: Pharmacist

## 2024-03-11 NOTE — Telephone Encounter (Signed)
   Patient to drop off documentation for taxes 2024 to certify income His income appears to meet income  requirements for Novo Nordisk (ozempic ) and AZ&me patient assistance programs (breztri ) based on patient report.  Triple therapy sample (trelegy) given LOT#2T5W exp,6/26  FBG 144 (improved) Continue metformin  for now GFR within normal limits Will attempt to start Ozempic  pending Novo PAP or LIS/extra help   Marvell Slider, PharmD, BCACP, CPP Clinical Pharmacist, [facility]

## 2024-03-12 NOTE — Progress Notes (Unsigned)
   03/12/2024 Name: Thomas Ball MRN: [id number] DOB: 05-13-1961  No chief complaint on file.   {Visit BJYN:82956}   Subjective:  Care Team: Primary Care Provider: Eliodoro Guerin, DO ; Next Scheduled Visit: *** {careteamprovider:27366}  Medication Access/Adherence  Current Pharmacy:  [facility], [place] - [address] [place] [address] Phone: [phone] Fax: [phone]  [facility] - [place], [place] - [address] [place] [address] Phone: [phone] Fax: [phone]   Patient reports affordability concerns with their medications: {YES/NO:21197} Patient reports access/transportation concerns to their pharmacy: {YES/NO:21197} Patient reports adherence concerns with their medications:  {YES/NO:21197} ***   Diabetes:  Current medications: Ozempic  (was given a sample)? Metformin  500 mg BID Medications tried in the past:   Current glucose readings: *** Using *** meter; testing *** times daily  Date of Download: *** % Time CGM is active: ***% Average Glucose: *** mg/dL Glucose Management Indicator: ***  Glucose Variability: *** (goal <36%) Time in Goal:  - Time in range 70-180: ***% - Time above range: ***% - Time below range: ***% Observed patterns:  Patient {Actions; denies-reports:120008} hypoglycemic s/sx including ***dizziness, shakiness, sweating. Patient {Actions; denies-reports:120008} hyperglycemic symptoms including ***polyuria, polydipsia, polyphagia, nocturia, neuropathy, blurred vision.  Current meal patterns:  - Breakfast: *** - Lunch *** - Supper *** - Snacks *** - Drinks ***  Current physical activity: ***  Current medication access support: *** COPD:  Current medications: Was given Trelelgy sample, Breztri ?, Wixela filled 02/04/24 Medications tried in the past:   Reports *** exacerbations in the past year  mMRC score: *** CAT score: ***  Current medication  access support: ***   Objective:  Lab Results  Component Value Date   HGBA1C 6.6 (H) 01/21/2024    Lab Results  Component Value Date   CREATININE 0.85 01/21/2024   BUN 17 01/21/2024   NA 138 01/21/2024   K 5.3 (H) 01/21/2024   CL 101 01/21/2024   CO2 26 01/21/2024    Lab Results  Component Value Date   CHOL 97 (L) 01/21/2024   HDL 41 01/21/2024   LDLCALC 39 01/21/2024   TRIG 82 01/21/2024   CHOLHDL 2.4 01/21/2024    Medications Reviewed Today   Medications were not reviewed in this encounter       Assessment/Plan:   {Pharmacy A/P Choices:26421}  Follow Up Plan: ***  Georga Killings, PharmD PGY-1 Pharmacy Resident

## 2024-03-13 ENCOUNTER — Other Ambulatory Visit: Payer: Self-pay | Admitting: Pharmacist

## 2024-03-13 NOTE — Telephone Encounter (Signed)
 Patient dropped off proof of income today. Just emailed to you, Aron Lard! Thanks!

## 2024-03-13 NOTE — Telephone Encounter (Signed)
 Called novo nordisk. Per rep: proof of income needed.

## 2024-03-13 NOTE — Progress Notes (Signed)
 Cardiology Office Note   Date:  03/15/2024   ID:  Thomas Ball, DOB 1961-01-25, MRN [id number]  PCP:  Jolinda Norene HERO, DO  Cardiologist:   Vina Gull, MD   Pt presents for follow up of palpitations     History of Present Illness: Thomas Ball is a 63 y.o. male with a history of palpitations, tachy/brady syndrome, HTN, HL, hep C with cirrhosis, GERD, OSA (on CPAP) 2015  s/p SVT ablation  2020   LHC   Normal coronary arteries     June 2024  Flecanide was increased to 75 bid   He felt poorly  Foggy, dizzy  Cut back to 50 bid  OCt 2024 Zio patch showed frequent PACs (20%) with   21 bursts of SVT, longest for 10 beats  Recomm increasing Toprol  XL to 25 bid   I saw the pt in Dec 2024    Since seen he says he is doing better   Doing push ups, partial sit ups  OVerall more active  Feeling like he has more energy     He denies  CP  No SOB   Monitor is reading fewer skips  Diet: Skips breakfast   Drinks water  and coke 0 Lunch   Frozen meal   Keep under 700 calories Dinner:   Frozen meal or frozen skillet   Rare eating out Snack   Potato chips  Current Meds  Medication Sig   albuterol  (PROVENTIL ) (2.5 MG/3ML) 0.083% nebulizer solution Take 3 mLs (2.5 mg total) by nebulization every 6 (six) hours as needed for wheezing or shortness of breath.   albuterol  (VENTOLIN  HFA) 108 (90 Base) MCG/ACT inhaler Inhale 2 puffs into the lungs every 6 (six) hours as needed for wheezing or shortness of breath.   atorvastatin  (LIPITOR) 40 MG tablet Take 1 tablet (40 mg total) by mouth daily.   doxazosin  (CARDURA ) 2 MG tablet Take 0.5 tablets (1 mg total) by mouth daily.   flecainide  (TAMBOCOR ) 50 MG tablet Take 1 tablet (50 mg total) by mouth 2 (two) times daily.   fluticasone  (FLONASE ) 50 MCG/ACT nasal spray Place 2 sprays into both nostrils daily.   metFORMIN  (GLUCOPHAGE ) 500 MG tablet Take 1 tablet (500 mg total) by mouth 2 (two) times daily.   metoprolol  succinate (TOPROL  XL) 25 MG 24 hr  tablet Take 1 tablet (25 mg total) by mouth in the morning and at bedtime.   omeprazole  (PRILOSEC) 40 MG capsule TAKE ONE (1) CAPSULE EACH DAY   sildenafil  (VIAGRA ) 100 MG tablet Take 0.5-1 tablets (50-100 mg total) by mouth daily as needed for erectile dysfunction.   tamsulosin  (FLOMAX ) 0.4 MG CAPS capsule TAKE ONE CAPSULE BY MOUTH DAILY     Allergies:   Patient has no known allergies.   Past Medical History:  Diagnosis Date   GERD (gastroesophageal reflux disease)    HCV (hepatitis C virus) DR. ZACKS   AUG 2013 VIRAL LOAD UNDETECTABLE   History of cardiac catheterization    a. 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   CHOLECYSTECTOMY     COLONOSCOPY  10/04/2012   SLF:ONE/8 SIMPLE  ADENOMA, TICS, SML IH   ESOPHAGOGASTRODUODENOSCOPY N/A 09/04/2014   Procedure: ESOPHAGOGASTRODUODENOSCOPY (EGD);  Surgeon: Margo LITTIE Haddock, MD;  Location: [facility];  Service: Endoscopy;  Laterality: N/A;  145   GALLBLADDER SURGERY     JAW BROKEN     LIPOMA REMOVALS     SEVERAL   RIGHT/LEFT HEART CATH AND CORONARY ANGIOGRAPHY N/A 10/08/2018   Procedure: RIGHT/LEFT HEART CATH AND CORONARY ANGIOGRAPHY;  Surgeon: Swaziland, Peter M, MD;  Location: [facility] INVASIVE CV LAB;  Service: Cardiovascular;  Laterality: N/A;   SUPRAVENTRICULAR TACHYCARDIA ABLATION N/A 06/22/2014   Procedure: SUPRAVENTRICULAR TACHYCARDIA ABLATION;  Surgeon: Danelle LELON Birmingham, MD;  Location: [facility] CATH LAB;  Service: Cardiovascular;  Laterality: N/A;     Social History:  The patient  reports that he has been smoking cigarettes. He has a 90 pack-year smoking history. He has never used smokeless tobacco. He reports that he does not drink alcohol and does not  use drugs.   Family History:  The patient's family history includes COPD in his mother; Diabetes in his father; Early death in his mother; Emphysema in his mother; High Cholesterol in his father; Hyperlipidemia in his father; Stroke in his father.    ROS:  Please see the history of present illness. All other systems are reviewed and  Negative to the above problem except as noted.    PHYSICAL EXAM: VS:  BP 110/60 (BP Location: Left Arm, Cuff Size: Large)   Pulse (!) 52   Ht 5' 5 (1.651 m)   Wt (!) 314 lb 12.8 oz (142.8 kg)   SpO2 93%   BMI 52.39 kg/m     GEN:  Morbidly obese 63 yo in NAD  Examined in chair  HEENT: normal  Neck: no JVD  Cardiac: RRR; no murmur  Trivial LE  edema  Respiratory:  clear to auscultation  GI: soft, nontender MS: no deformity Moving all extremities      EKG:  EKG not done    Lipid Panel    Component Value Date/Time   CHOL 97 (L) 01/21/2024 0856   TRIG 82 01/21/2024 0856   HDL 41 01/21/2024 0856   CHOLHDL 2.4 01/21/2024 0856   CHOLHDL 5.4 09/06/2017 1321   VLDL 24 09/06/2017 1321   LDLCALC 39 01/21/2024 0856      Wt Readings from Last 3 Encounters:  03/14/24 (!) 314 lb 12.8 oz (142.8 kg)  01/21/24 (!) 318 lb (144.2 kg)  01/08/24 (!) 314 lb 3.2 oz (142.5 kg)      ASSESSMENT AND PLAN:  1  Atrial ectopy   Pt doing well   Denies palpitaitions   Continue flecanide 50 bid  2  Dyspnea  PT overall doing better     2  HL   LDL 39  HDL 41  Continue atorvastatin   4 Metabolic  A1C 6.6    Reviewed diet   Cut out carbs    Congratulated on activity   Keep it up   Current medicines are reviewed at length with the patient today.  The patient does not have concerns regarding medicines.  Signed, Vina Gull, MD  03/15/2024 9:42 PM    [facility] [address] [place], [place], [place]  [address] Phone: [phone]; Fax: [phone]

## 2024-03-14 ENCOUNTER — Encounter: Payer: Self-pay | Admitting: Internal Medicine

## 2024-03-14 ENCOUNTER — Ambulatory Visit: Payer: Self-pay | Attending: Internal Medicine | Admitting: Internal Medicine

## 2024-03-14 VITALS — BP 110/60 | HR 52 | Ht 65.0 in | Wt 314.8 lb

## 2024-03-14 DIAGNOSIS — R002 Palpitations: Secondary | ICD-10-CM

## 2024-03-14 NOTE — Patient Instructions (Signed)
 Medication Instructions:  Your physician recommends that you continue on your current medications as directed. Please refer to the Current Medication list given to you today.  *If you need a refill on your cardiac medications before your next appointment, please call your pharmacy*  Lab Work: NONE   If you have labs (blood work) drawn today and your tests are completely normal, you will receive your results only by: MyChart Message (if you have MyChart) OR A paper copy in the mail If you have any lab test that is abnormal or we need to change your treatment, we will call you to review the results.  Testing/Procedures: NONE   Follow-Up: At [facility], you and your health needs are our priority.  As part of our continuing mission to provide you with exceptional heart care, our providers are all part of one team.  This team includes your primary Cardiologist (physician) and Advanced Practice Providers or APPs (Physician Assistants and Nurse Practitioners) who all work together to provide you with the care you need, when you need it.  Your next appointment:    February   Provider:   You may see Ola Berger, MD or one of the following Advanced Practice Providers on your designated Care Team:   Woodfin Hays, PA-C  [place], [place] Theotis Flake, [place]     We recommend signing up for the patient portal called MyChart.  Sign up information is provided on this After Visit Summary.  MyChart is used to connect with patients for Virtual Visits (Telemedicine).  Patients are able to view lab/test results, encounter notes, upcoming appointments, etc.  Non-urgent messages can be sent to your provider as well.   To learn more about what you can do with MyChart, go to [email or web address].   Other Instructions Thank you for choosing [facility]!

## 2024-03-14 NOTE — Telephone Encounter (Signed)
 Income submitted to novo nordisk.

## 2024-03-19 NOTE — Progress Notes (Signed)
 Pharmacy Medication Assistance Program Note    03/19/2024  Patient ID: Thomas Ball, male  DOB: 1961/05/29, 63 y.o.  MRN:  [id number]     02/21/2024  Outreach Medication Two  Initial Outreach Date (Medication Two) 02/21/2024  Manufacturer Medication Two Novo Nordisk  Nordisk Drugs Ozempic   Dose of Ozempic  0.5MG   Type of Journalist, newspaper to [employer] Fax  Date Application Submitted to Manufacturer 02/26/2024  Patient Assistance Determination Approved  Approval Start Date 03/17/2024    NEW - approved. Medication shipment processing.

## 2024-03-26 ENCOUNTER — Ambulatory Visit: Payer: Self-pay | Admitting: Internal Medicine

## 2024-03-26 ENCOUNTER — Ambulatory Visit: Payer: Self-pay | Admitting: Primary Care

## 2024-03-26 ENCOUNTER — Encounter: Payer: Self-pay | Admitting: Primary Care

## 2024-03-31 ENCOUNTER — Telehealth: Payer: Self-pay | Admitting: Family Medicine

## 2024-03-31 NOTE — Telephone Encounter (Signed)
 Samples of Trelegy. Pt is here in lobby asking for them.

## 2024-04-01 ENCOUNTER — Other Ambulatory Visit (INDEPENDENT_AMBULATORY_CARE_PROVIDER_SITE_OTHER): Payer: Self-pay | Admitting: Pharmacist

## 2024-04-01 DIAGNOSIS — Z7984 Long term (current) use of oral hypoglycemic drugs: Secondary | ICD-10-CM

## 2024-04-01 DIAGNOSIS — E119 Type 2 diabetes mellitus without complications: Secondary | ICD-10-CM

## 2024-04-01 NOTE — Progress Notes (Signed)
 04/01/2024 Name: Thomas Ball MRN: [id number] DOB: [birth year]  Chief Complaint  Patient presents with   Diabetes   COPD    Thomas Ball is a 63 y.o. year old male who presented for a telephone visit.  I connected with  Thomas Ball on 04/01/24 by telephone and verified that I am speaking with the correct person using two identifiers. I discussed the limitations of evaluation and management by telemedicine. The patient expressed understanding and agreed to proceed.  Patient was located in her home and PharmD in PCP office during this visit.;    They were referred to the pharmacist by their PCP for assistance in managing diabetes and medication access.    Subjective:  Care Team: Primary Care Provider: Jolinda Norene HERO, DO    Medication Access/Adherence  Current Pharmacy:  [facility] [place], [place] - [address] [place] [address] Phone: [phone] Fax: [phone]  [facility] - [place], [place] - [address] [place] [address] Phone: [phone] Fax: [phone]   Patient reports affordability concerns with their medications: Yes  Patient reports access/transportation concerns to their pharmacy: No  Patient reports adherence concerns with their medications:  No     Diabetes:  Current medications: metformin , new start Ozempic  0.25mg  weekly (hasn't started yet, should come in on Novo PAP_ Medications tried in the past: n/a  Current glucose readings: FBG<130 Using traditional glucometer daily  Current meal patterns:  The patient is asked to make an attempt to improve diet and exercise patterns to aid in medical management of this problem. Discussed meal planning options and Plate method for healthy eating Avoid sugary drinks and desserts Incorporate balanced protein, non starchy veggies, 1 serving of carbohydrate with each meal Increase water  intake Increase physical activity as able  Current  physical activity: increase as able  Current medication access support: approved for NOVO PAP for ozempic    Objective:  Lab Results  Component Value Date   HGBA1C 6.6 (H) 01/21/2024    Lab Results  Component Value Date   CREATININE 0.85 01/21/2024   BUN 17 01/21/2024   NA 138 01/21/2024   K 5.3 (H) 01/21/2024   CL 101 01/21/2024   CO2 26 01/21/2024    Lab Results  Component Value Date   CHOL 97 (L) 01/21/2024   HDL 41 01/21/2024   LDLCALC 39 01/21/2024   TRIG 82 01/21/2024   CHOLHDL 2.4 01/21/2024    Medications Reviewed Today     Reviewed by Billee Mliss BIRCH, [facility] (Pharmacist) on 04/01/24 at 1603  Med List Status: <None>   Medication Order Taking? Sig Documenting Provider Last Dose Status Informant  albuterol  (PROVENTIL ) (2.5 MG/3ML) 0.083% nebulizer solution [id number]  Take 3 mLs (2.5 mg total) by nebulization every 6 (six) hours as needed for wheezing or shortness of breath. Parrett, Madelin RAMAN, NP  Active   albuterol  (VENTOLIN  HFA) 108 (90 Base) MCG/ACT inhaler [id number]  Inhale 2 puffs into the lungs every 6 (six) hours as needed for wheezing or shortness of breath. Cathlene Marry Lenis, FNP  Active   atorvastatin  (LIPITOR) 40 MG tablet [id number]  Take 1 tablet (40 mg total) by mouth daily. Jolinda Norene M, DO  Active   budesonide -glycopyrrolate-formoterol  (BREZTRI  AEROSPHERE) 160-9-4.8 MCG/ACT AERO inhaler [id number]  Inhale 2 puffs into the lungs 2 (two) times daily.  Patient not taking: Reported on 03/14/2024   Jolinda Norene HERO, DO  Active  Med Note (Kalim Kissel D   Fri Feb 29, 2024  3:04 PM) Via [place] patient assistance program   doxazosin  (CARDURA ) 2 MG tablet [id number]  Take 0.5 tablets (1 mg total) by mouth daily. Jolinda Potter M, DO  Active   flecainide  (TAMBOCOR ) 50 MG tablet [id number]  Take 1 tablet (50 mg total) by mouth 2 (two) times daily. Okey Vina GAILS, MD  Active   fluticasone  (FLONASE ) 50 MCG/ACT nasal spray [id number]  Place 2  sprays into both nostrils daily. [place] Thompson, Nena, NP  Active   furosemide  (LASIX ) 20 MG tablet [id number]  TAKE ONE (1) TABLET BY MOUTH EVERY DAY  Patient not taking: Reported on 03/14/2024   Jolinda Potter M, DO  Active   metFORMIN  (GLUCOPHAGE ) 500 MG tablet [id number]  Take 1 tablet (500 mg total) by mouth 2 (two) times daily. Jolinda Potter M, DO  Active   metoprolol  succinate (TOPROL  XL) 25 MG 24 hr tablet [id number]  Take 1 tablet (25 mg total) by mouth in the morning and at bedtime. Okey Vina GAILS, MD  Active   nystatin  powder [id number]  Apply 1 Application topically 3 (three) times daily. X14 per flareup  Patient not taking: Reported on 03/14/2024   Jolinda Potter M, DO  Active   omeprazole  (PRILOSEC) 40 MG capsule [id number]  TAKE ONE (1) CAPSULE EACH DAY [place], [place] M, [place]  Active   Semaglutide ,0.25 or 0.5MG /DOS, (OZEMPIC , 0.25 OR 0.5 MG/DOSE,) 2 MG/3ML SOPN [id number]  Inject 0.25 mg into the skin once a week.  Patient not taking: Reported on 03/14/2024   Jolinda Potter HERO, DO  Active            Med Note TENA, Finnick Orosz D   Fri Feb 29, 2024  3:04 PM) Via novo nordisk patient assistance program    sildenafil  (VIAGRA ) 100 MG tablet [id number]  Take 0.5-1 tablets (50-100 mg total) by mouth daily as needed for erectile dysfunction. Jolinda Potter M, DO  Active   tamsulosin  (FLOMAX ) 0.4 MG CAPS capsule [id number]  TAKE ONE CAPSULE BY MOUTH DAILY Jolinda Potter M, DO  Active   Vitamin D , Ergocalciferol , (DRISDOL ) 1.25 MG (50000 UNIT) CAPS capsule [id number]  Take 1 capsule (50,000 Units total) by mouth every 7 (seven) days.  Patient not taking: Reported on 03/14/2024   Jolinda Potter HERO, DO  Active   Med List Note Christie, Alyson, CPhT 05/04/21 1200): Cpap              Assessment/Plan:   Diabetes: - Currently controlled - Reviewed long term cardiovascular and renal outcomes of uncontrolled blood sugar - Reviewed goal A1c, goal fasting, and goal 2 hour  post prandial glucose - Recommend to : Continue current regimen Start ozempic  0.25mg  weekly when shipment arrives Adjust metformin  as needed--may be able to d/c  - Patient denies personal or family history of multiple endocrine neoplasia type 2, medullary thyroid  cancer; personal history of pancreatitis or gallbladder disease. - Recommend to check glucose daily (fasting) or if symptomatic - Meets financial criteria for ozempic  patient assistance program through novo nordisk. Patient approved;awaiting shipment  COPD: Left trelegy samples up front-->patient states triple therapy has helped tremendously  Denied for [place] PAP (breztri ) Will try to appeal--message sent to CPhT to assist    Follow Up Plan: 1 month  Mliss Tarry Griffin, PharmD, BCACP, CPP Clinical Pharmacist, [facility]

## 2024-04-01 NOTE — Telephone Encounter (Signed)
 Faxed proof of income (2024 tax return/ 1040)

## 2024-04-01 NOTE — Telephone Encounter (Signed)
 Did we submit his income to AZ&me as well? I know he was approved for novo nordisk PAP.  Thank you!

## 2024-04-02 NOTE — Telephone Encounter (Signed)
   Patient may be eligible for Medicaid based on income

## 2024-04-08 ENCOUNTER — Ambulatory Visit (INDEPENDENT_AMBULATORY_CARE_PROVIDER_SITE_OTHER): Payer: Self-pay | Admitting: Nurse Practitioner

## 2024-04-08 ENCOUNTER — Encounter: Payer: Self-pay | Admitting: Nurse Practitioner

## 2024-04-08 VITALS — BP 118/72 | HR 50 | Temp 98.1°F | Ht 65.0 in | Wt 316.0 lb

## 2024-04-08 DIAGNOSIS — S301XXA Contusion of abdominal wall, initial encounter: Secondary | ICD-10-CM

## 2024-04-08 NOTE — Progress Notes (Signed)
   Subjective:    Patient ID: Thomas Ball, male    DOB: 1961/08/13, 63 y.o.   MRN: [id number]   Chief Complaint: bruising  HPI  Patient says he noticed some bruising on left flank on Sunday. Denies any injury. No pain. He was on ASA 81mg  but he stopped taking since he found bruising. Patient Active Problem List   Diagnosis Date Noted   BMI 50.0-59.9, adult (HCC) 01/21/2024   Blood in stool 01/08/2024   Influenza A virus present 10/15/2023   Nasal congestion 10/15/2023   Acute cough 10/15/2023   Body aches 10/15/2023   Low back pain 04/05/2023   URI with cough and congestion 04/05/2023   Tooth abscess 04/05/2023   Long term (current) use of oral hypoglycemic drugs 03/13/2023   Diabetes mellitus type II, controlled (HCC) 03/13/2023   Hyperlipidemia associated with type 2 diabetes mellitus (HCC) 04/29/2021   Panic attacks 12/07/2020   Chest congestion 12/07/2020   Hypertension associated with diabetes (HCC) 04/26/2018   OSA (obstructive sleep apnea) 03/04/2018   BPH (benign prostatic hyperplasia) 10/07/2015   EIC (epidermal inclusion cyst) 07/23/2015   Tobacco abuse 07/02/2015   SVT (supraventricular tachycardia) (HCC)    Intermittent palpitations 04/21/2014   Bradycardia 04/21/2014   Morbid obesity (HCC) 04/21/2014   Cirrhosis of liver (HCC) 11/05/2013   GERD (gastroesophageal reflux disease) 07/18/2012   Screening for colorectal cancer 07/18/2012   Pulmonary nodule 02/14/2012       Review of Systems  Constitutional:  Negative for diaphoresis.  Eyes:  Negative for pain.  Respiratory:  Negative for shortness of breath.   Cardiovascular:  Negative for chest pain, palpitations and leg swelling.  Gastrointestinal:  Negative for abdominal pain.  Endocrine: Negative for polydipsia.  Skin:  Negative for rash.  Neurological:  Negative for dizziness, weakness and headaches.  Hematological:  Does not bruise/bleed easily.  All other systems reviewed and are  negative.      Objective:   Physical Exam Constitutional:      Appearance: Normal appearance. He is obese.  Skin:    General: Skin is warm.     Comments: Contusion noted on left abdominal wall  Neurological:     General: No focal deficit present.     Mental Status: He is alert and oriented to person, place, and time.  Psychiatric:        Mood and Affect: Mood normal.        Behavior: Behavior normal.    BP 118/72   Pulse (!) 50   Temp 98.1 F (36.7 C) (Temporal)   Ht 5' 5 (1.651 m)   Wt (!) 316 lb (143.3 kg)   SpO2 93%   BMI 52.59 kg/m          [name] in today with chief complaint of Bruising on left side (No injury)   1. Contusion of abdominal wall, initial encounter (Primary) Ice area Rto office is worsens It is ok to continue ASA 81mg  daily    The above assessment and management plan was discussed with the patient. The patient verbalized understanding of and has agreed to the management plan. Patient is aware to call the clinic if symptoms persist or worsen. Patient is aware when to return to the clinic for a follow-up visit. Patient educated on when it is appropriate to go to the emergency department.   Mary-Margaret Gladis, FNP

## 2024-04-08 NOTE — Patient Instructions (Signed)
Contusion A contusion is a deep bruise. This is a result of an injury that causes bleeding under the skin. Symptoms of bruising include pain, swelling, and discolored skin. The skin may turn blue, purple, or yellow. Follow these instructions at home: Managing pain, stiffness, and swelling You may use RICE. This stands for: Resting. Icing. Compression, or putting pressure on the injured area. Elevating, or raising the injured area. To follow this method, do these actions: Rest the injured area. If told, put ice on the injured area. To do this: Put ice in a plastic bag. Place a towel between your skin and the bag. Leave the ice on for 20 minutes, 2-3 times per day. If your skin turns bright red, take off the ice right away to prevent skin damage. The risk of skin damage is higher if you cannot feel pain, heat, or cold. If told, apply compression on the injured area using an elastic bandage. Make sure the bandage is not too tight. If the area tingles or has a loss of feeling (numbness), remove it and put it back on as told by your doctor. If possible, elevate the injured area above the level of your heart while you are sitting or lying down.  General instructions Take over-the-counter and prescription medicines only as told by your doctor. Keep all follow-up visits. Your doctor may want to see how your contusion is healing with treatment. Contact a doctor if: Your symptoms do not get better after several days of treatment. Your symptoms get worse. You have trouble moving the injured area. Get help right away if: You have very bad pain. You have a loss of feeling (numbness) in a hand or foot. Your hand or foot turns pale or cold. This information is not intended to replace advice given to you by your health care provider. Make sure you discuss any questions you have with your health care provider. Document Revised: 02/27/2022 Document Reviewed: 02/27/2022 Elsevier Patient Education  2024  [employer].

## 2024-04-10 ENCOUNTER — Ambulatory Visit: Payer: Self-pay | Admitting: Internal Medicine

## 2024-04-22 ENCOUNTER — Other Ambulatory Visit (INDEPENDENT_AMBULATORY_CARE_PROVIDER_SITE_OTHER): Payer: Self-pay | Admitting: Pharmacist

## 2024-04-22 ENCOUNTER — Encounter: Payer: Self-pay | Admitting: Pharmacist

## 2024-04-22 NOTE — Progress Notes (Unsigned)
   Called patient; Left VM New start Ozempic  (novo nordisk PAP supply arrived to clinic) Potentially wean metformin  once stable on Ozempic  Well controlled at baseline; adding ozempic  for cardiac protection and anti-obesity effects

## 2024-04-22 NOTE — Telephone Encounter (Signed)
 I called patient and left a detailed message making him aware that we received his shipment of OZEMPIC  and advised for him to come by the office and pick it up asap.

## 2024-04-23 ENCOUNTER — Ambulatory Visit: Payer: Self-pay | Admitting: *Deleted

## 2024-04-23 DIAGNOSIS — Z7985 Long-term (current) use of injectable non-insulin antidiabetic drugs: Secondary | ICD-10-CM

## 2024-04-23 NOTE — Progress Notes (Signed)
 Patient is in office today for a nurse visit for Medication Education. Patient was provided with instruction and demonstration on how to Administer Medication Ozempic . Patient gave himself first dose with nurse observation.

## 2024-04-30 ENCOUNTER — Other Ambulatory Visit: Payer: Self-pay

## 2024-05-07 ENCOUNTER — Encounter: Payer: Self-pay | Admitting: *Deleted

## 2024-05-07 ENCOUNTER — Ambulatory Visit (INDEPENDENT_AMBULATORY_CARE_PROVIDER_SITE_OTHER): Payer: Self-pay | Admitting: Internal Medicine

## 2024-05-07 ENCOUNTER — Encounter: Payer: Self-pay | Admitting: Internal Medicine

## 2024-05-07 ENCOUNTER — Other Ambulatory Visit: Payer: Self-pay | Admitting: *Deleted

## 2024-05-07 VITALS — BP 118/69 | HR 71 | Temp 97.1°F | Ht 65.0 in | Wt 309.7 lb

## 2024-05-07 DIAGNOSIS — K219 Gastro-esophageal reflux disease without esophagitis: Secondary | ICD-10-CM

## 2024-05-07 DIAGNOSIS — K7469 Other cirrhosis of liver: Secondary | ICD-10-CM

## 2024-05-07 DIAGNOSIS — Z860101 Personal history of adenomatous and serrated colon polyps: Secondary | ICD-10-CM

## 2024-05-07 DIAGNOSIS — K7460 Unspecified cirrhosis of liver: Secondary | ICD-10-CM

## 2024-05-07 DIAGNOSIS — Z8619 Personal history of other infectious and parasitic diseases: Secondary | ICD-10-CM

## 2024-05-07 MED ORDER — PEG 3350-KCL-NA BICARB-NACL 420 G PO SOLR: 4000.0000 mL | Freq: Once | ORAL | 0 refills | Status: AC

## 2024-05-07 NOTE — Progress Notes (Signed)
 Primary Care Physician:  Jolinda Norene HERO, DO Primary Gastroenterologist:  Dr. Cindie  Chief Complaint  Patient presents with   New Patient (Initial Visit)    Patient here today as a new patient due to having constipation. He takes a stool softener daily. Patient denies any sight of blood per rectum. He reports that he woke up during the night once and pulled a scab off of his testicle,the following morning on his bed clothes were saturated in blood. He says he could not find the source of the blood,but he is on a blood thinner. Has a history of internal hemorrhoids, and has some fecal leakage at times.     HPI:   Thomas Ball is a 63 y.o. male who presents to clinic today by referral from his PCP Dr. Jolinda.    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 Last seen in our office May 2022.   He is status post treatment for his hepatitis C in 2012 with PEG interferon as well as ribavirin with negative HCVRNA in 2022. MELD historically low.   Patient states that he lost patient assistance after previous visit so he did not follow up.   Variceal screening: EGD 09/04/2014 did not show esophageal varices. Did show gastritis, esophagitis, biopsies negative for H pylori.    HCC screening: U/S 10/12/21 without hepatoma. Overdue for repeat imaging.   Overall today patient states he is doing well.  Denies confusion or fatigue.  No melena hematochezia.  Has developed mild LE swelling for which he was splaced on furosemide  20 mg daily, this is improved.   History of adenomatous colon polyps: Last colonoscopy 2014 with adenomas removed.  No family history of colorectal malignancy.  Chronic GERD: well controlled on omeprazole  40 mg daily. No dysphagia or odynophagia. No epigastric or chest pain.   Past Medical History:  Diagnosis Date   GERD (gastroesophageal reflux disease)    HCV (hepatitis C virus) DR. ZACKS   AUG 2013 VIRAL LOAD UNDETECTABLE   History of cardiac catheterization    a.  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   CHOLECYSTECTOMY     COLONOSCOPY  10/04/2012   SLF:ONE/8 SIMPLE ADENOMA, TICS, SML IH   ESOPHAGOGASTRODUODENOSCOPY N/A 09/04/2014   Procedure: ESOPHAGOGASTRODUODENOSCOPY (EGD);  Surgeon: Margo LITTIE Haddock, MD;  [facility]: [facility];  Service: Endoscopy;  Laterality: N/A;  145   GALLBLADDER SURGERY     JAW BROKEN     LIPOMA REMOVALS     SEVERAL   RIGHT/LEFT HEART CATH AND CORONARY ANGIOGRAPHY N/A 10/08/2018   Procedure: RIGHT/LEFT HEART CATH AND CORONARY ANGIOGRAPHY;  Surgeon: Swaziland, Peter M, MD;  Location: [facility] INVASIVE CV LAB;  Service: Cardiovascular;  Laterality: N/A;   SUPRAVENTRICULAR TACHYCARDIA ABLATION N/A 06/22/2014   Procedure: SUPRAVENTRICULAR TACHYCARDIA ABLATION;  Surgeon: Danelle LELON Waddell, MD;  Location: [facility] CATH LAB;  Service: Cardiovascular;  Laterality: N/A;    Current Outpatient Medications  Medication Sig Dispense Refill   albuterol  (PROVENTIL ) (2.5 MG/3ML) 0.083% nebulizer solution Take 3 mLs (2.5 mg total) by nebulization every 6 (six) hours as needed for wheezing or shortness of breath. 150 mL 5  albuterol  (VENTOLIN  HFA) 108 (90 Base) MCG/ACT inhaler Inhale 2 puffs into the lungs every 6 (six) hours as needed for wheezing or shortness of breath. 8 g 2   atorvastatin  (LIPITOR) 40 MG tablet Take 1 tablet (40 mg total) by mouth daily. 90 tablet 3   budesonide -glycopyrrolate-formoterol  (BREZTRI  AEROSPHERE) 160-9-4.8 MCG/ACT AERO inhaler Inhale 2 puffs into the lungs 2 (two) times daily. 32.1 g 4   doxazosin  (CARDURA ) 2 MG tablet Take 0.5 tablets (1 mg total) by mouth daily. 45 tablet 3   flecainide  (TAMBOCOR ) 50 MG tablet Take 1 tablet (50 mg  total) by mouth 2 (two) times daily.     furosemide  (LASIX ) 20 MG tablet TAKE ONE (1) TABLET BY MOUTH EVERY DAY 30 tablet 3   metFORMIN  (GLUCOPHAGE ) 500 MG tablet Take 1 tablet (500 mg total) by mouth 2 (two) times daily. 180 tablet 3   metoprolol  succinate (TOPROL  XL) 25 MG 24 hr tablet Take 1 tablet (25 mg total) by mouth in the morning and at bedtime. 180 tablet 3   omeprazole  (PRILOSEC) 40 MG capsule TAKE ONE (1) CAPSULE EACH DAY 90 capsule 3   Semaglutide ,0.25 or 0.5MG /DOS, (OZEMPIC , 0.25 OR 0.5 MG/DOSE,) 2 MG/3ML SOPN Inject 0.25 mg into the skin once a week.     sildenafil  (VIAGRA ) 100 MG tablet Take 0.5-1 tablets (50-100 mg total) by mouth daily as needed for erectile dysfunction. 5 tablet 11   tamsulosin  (FLOMAX ) 0.4 MG CAPS capsule TAKE ONE CAPSULE BY MOUTH DAILY 30 capsule 3   No current facility-administered medications for this visit.    Allergies as of 05/07/2024   (No Known Allergies)    Family History  Problem Relation Age of Onset   Emphysema Mother    COPD Mother    Early death Mother    Hyperlipidemia Father    Stroke Father    Diabetes Father    High Cholesterol Father    Colon cancer Neg Hx     Social History   Socioeconomic History   Marital status: Widowed    Spouse name: Not on file   Number of children: Not on file   Years of education: Not on file   Highest education level: Not on file  Occupational History   Occupation: concrete work    Associate Professor: SELF-EMPLOYED  Tobacco Use   Smoking status: Every Day    Current packs/day: 2.00    Average packs/day: 2.0 packs/day for 45.0 years (90.0 ttl pk-yrs)    Types: Cigarettes   Smokeless tobacco: Never   Tobacco comments:    smokes 1-1.5 packs per day  Vaping Use   Vaping status: Never Used  Substance and Sexual Activity   Alcohol use: No   Drug use: No    Comment: in the 1980s, smoke crack. None since Dec 2011.    Sexual activity: Not Currently  Other Topics Concern   Not on file  Social  History Narrative   Not on file   Social Drivers of Health   Financial Resource Strain: Not on file  Food Insecurity: Not on file  Transportation Needs: Not on file  Physical Activity: Not on file  Stress: Not on file  Social Connections: Not on file  Intimate Partner Violence: Not on file    Subjective: Review of Systems  Constitutional:  Negative for chills and fever.  HENT:  Negative for congestion and hearing loss.   Eyes:  Negative for blurred vision and double vision.  Respiratory:  Negative for cough and  shortness of breath.   Cardiovascular:  Negative for chest pain and palpitations.  Gastrointestinal:  Negative for abdominal pain, blood in stool, constipation, diarrhea, heartburn, melena and vomiting.  Genitourinary:  Negative for dysuria and urgency.  Musculoskeletal:  Negative for joint pain and myalgias.  Skin:  Negative for itching and rash.  Neurological:  Negative for dizziness and headaches.  Psychiatric/Behavioral:  Negative for depression. The patient is not nervous/anxious.        Objective: BP 118/69 (BP Location: Left Arm, Patient Position: Sitting, Cuff Size: Large)   Pulse 71   Temp (!) 97.1 F (36.2 C) (Temporal)   Ht 5' 5 (1.651 m)   Wt (!) 309 lb 11.2 oz (140.5 kg)   BMI 51.54 kg/m  Physical Exam Constitutional:      Appearance: Normal appearance. He is obese.  HENT:     Head: Normocephalic and atraumatic.  Eyes:     Extraocular Movements: Extraocular movements intact.     Conjunctiva/sclera: Conjunctivae normal.  Cardiovascular:     Rate and Rhythm: Normal rate and regular rhythm.  Pulmonary:     Effort: Pulmonary effort is normal.     Breath sounds: Normal breath sounds.  Abdominal:     General: Bowel sounds are normal.     Palpations: Abdomen is soft.  Musculoskeletal:        General: Normal range of motion.     Cervical back: Normal range of motion and neck supple.  Skin:    General: Skin is warm.  Neurological:     General:  No focal deficit present.     Mental Status: He is alert and oriented to person, place, and time.  Psychiatric:        Mood and Affect: Mood normal.        Behavior: Behavior normal.      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  Counseled him that we need to at least see him every 6 months and he understands.  He would like to reestablish care with us  today.  We will check ultrasound and AFP today for [facility] screening  We will schedule for EGD for variceal screening.  At the same time we will perform colonoscopy for surveillance purposes due to personal history of adenomatous colon polyps.  The risks including infection, bleed, or perforation as well as benefits, limitations, alternatives and imponderables have been reviewed with the patient. Questions have been answered. All parties agreeable.  I will check meld labs today.  Counseled on weight loss  Nutrition recommendations:  High-protein diet from a primarily plant-based diet. Avoid red meat.  No raw or undercooked meat, seafood, or shellfish. Low-fat/cholesterol/carbohydrate diet. Limit sodium to no more than 2000 mg/day including everything that you eat and drink. Recommend at least 30 minutes of aerobic and resistance exercise 3 days/week.  Continue furosemide  20 mg daily.  Appears euvolemic on exam today.  Patient follow-up after procedures.  05/07/2024 9:43 AM   Disclaimer: This note was dictated with voice recognition software. Similar sounding words can inadvertently be transcribed and may not be corrected upon review.

## 2024-05-07 NOTE — Patient Instructions (Signed)
 We will schedule you for upper endoscopy and colonoscopy.  I am going to check blood work today at [facility].  We will call with results.  This is in regards to your cirrhosis.  I am also going to order an ultrasound of your liver to screen for liver cancer.  It was very nice seeing you again today.  Dr. Cindie

## 2024-05-08 ENCOUNTER — Other Ambulatory Visit: Payer: Self-pay

## 2024-05-08 ENCOUNTER — Encounter: Payer: Self-pay | Admitting: *Deleted

## 2024-05-09 LAB — PROTIME-INR
INR: 1.1 (ref 0.9–1.2)
Prothrombin Time: 11.4 s (ref 9.1–12.0)

## 2024-05-09 LAB — COMPREHENSIVE METABOLIC PANEL WITH GFR
ALT: 25 IU/L (ref 0–44)
AST: 22 IU/L (ref 0–40)
Albumin: 4.3 g/dL (ref 3.9–4.9)
Alkaline Phosphatase: 27 IU/L — ABNORMAL LOW (ref 44–121)
BUN/Creatinine Ratio: 20 (ref 10–24)
BUN: 18 mg/dL (ref 8–27)
Bilirubin Total: 1.1 mg/dL (ref 0.0–1.2)
CO2: 24 mmol/L (ref 20–29)
Calcium: 9.5 mg/dL (ref 8.6–10.2)
Chloride: 99 mmol/L (ref 96–106)
Creatinine, Ser: 0.89 mg/dL (ref 0.76–1.27)
Globulin, Total: 2.4 g/dL (ref 1.5–4.5)
Glucose: 111 mg/dL — ABNORMAL HIGH (ref 70–99)
Potassium: 4.6 mmol/L (ref 3.5–5.2)
Sodium: 137 mmol/L (ref 134–144)
Total Protein: 6.7 g/dL (ref 6.0–8.5)
eGFR: 97 mL/min/1.73 (ref >59)

## 2024-05-09 LAB — AFP TUMOR MARKER: AFP, Serum, Tumor Marker: 2.2 ng/mL (ref 0.0–8.4)

## 2024-05-15 ENCOUNTER — Ambulatory Visit (HOSPITAL_COMMUNITY)
Admission: RE | Admit: 2024-05-15 | Discharge: 2024-05-15 | Disposition: A | Discharge status: Home / Self Care | Payer: Self-pay | Source: Ambulatory Visit | Attending: Internal Medicine | Admitting: Internal Medicine

## 2024-05-15 DIAGNOSIS — K7469 Other cirrhosis of liver: Secondary | ICD-10-CM | POA: Insufficient documentation

## 2024-05-16 ENCOUNTER — Encounter: Payer: Self-pay | Admitting: Radiology

## 2024-05-24 ENCOUNTER — Ambulatory Visit
Admission: EM | Admit: 2024-05-24 | Discharge: 2024-05-24 | Disposition: A | Discharge status: Home / Self Care | Payer: Self-pay | Attending: Nurse Practitioner | Admitting: Nurse Practitioner

## 2024-05-24 DIAGNOSIS — R399 Unspecified symptoms and signs involving the genitourinary system: Secondary | ICD-10-CM | POA: Insufficient documentation

## 2024-05-24 DIAGNOSIS — R339 Retention of urine, unspecified: Secondary | ICD-10-CM | POA: Insufficient documentation

## 2024-05-24 LAB — POCT URINE DIPSTICK
Bilirubin, UA: NEGATIVE
Glucose, UA: NEGATIVE mg/dL
Ketones, POC UA: NEGATIVE mg/dL
Nitrite, UA: NEGATIVE
POC PROTEIN,UA: 100 — AB
Spec Grav, UA: 1.02 (ref 1.010–1.025)
Urobilinogen, UA: 1 U/dL
pH, UA: 7 (ref 5.0–8.0)

## 2024-05-24 MED ORDER — SULFAMETHOXAZOLE-TRIMETHOPRIM 800-160 MG PO TABS: 1.0000 | ORAL_TABLET | Freq: Two times a day (BID) | ORAL | 0 refills | Status: AC

## 2024-05-24 NOTE — ED Provider Notes (Signed)
 [facility]    CSN: [id number] Arrival date & time: 05/24/24  0808      History   Chief Complaint No chief complaint on file.   HPI Thomas Ball is a 63 y.o. male.   The history is provided by the patient.   Patient presents with a 3-day history of urinary frequency, decreased urine stream, urinary hesitancy, and dysuria.  States that he feels like he has to go to the restroom, but can only produce a small amount of urine.  He denies fever, chills, abdominal pain, nausea, vomiting, flank pain, hematuria, or low back pain.  Patient reports he has a history of prostate problems, but cannot elaborate on exactly what those issues are.  He states that his doctor has started him on tamsulosin  in the past.  Patient reports prior history of a UTI, states he was then referred to urology, but never followed up.  Denies prior history of kidney stones or kidney infection.  Past Medical History:  Diagnosis Date   GERD (gastroesophageal reflux disease)    HCV (hepatitis C virus) DR. ZACKS   AUG 2013 VIRAL LOAD UNDETECTABLE   History of cardiac catheterization    a. 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   Sleep apnea    SVT (supraventricular tachycardia) (HCC)     Patient Active Problem List   Diagnosis Date Noted   BMI 50.0-59.9, adult (HCC) 01/21/2024   Blood in stool 01/08/2024   Influenza A virus present 10/15/2023   Nasal congestion 10/15/2023   Acute cough 10/15/2023   Body aches 10/15/2023   Low back pain 04/05/2023   URI with cough and congestion 04/05/2023   Tooth abscess 04/05/2023   Long term (current) use of oral hypoglycemic drugs 03/13/2023   Diabetes mellitus type II, controlled (HCC) 03/13/2023   Hyperlipidemia associated with type 2 diabetes mellitus (HCC) 04/29/2021   Panic attacks 12/07/2020   Chest congestion  12/07/2020   Hypertension associated with diabetes (HCC) 04/26/2018   OSA (obstructive sleep apnea) 03/04/2018   BPH (benign prostatic hyperplasia) 10/07/2015   EIC (epidermal inclusion cyst) 07/23/2015   Tobacco abuse 07/02/2015   SVT (supraventricular tachycardia) (HCC)    Intermittent palpitations 04/21/2014   Bradycardia 04/21/2014   Morbid obesity (HCC) 04/21/2014   Cirrhosis of liver (HCC) 11/05/2013   GERD (gastroesophageal reflux disease) 07/18/2012   Screening for colorectal cancer 07/18/2012   Pulmonary nodule 02/14/2012    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   CHOLECYSTECTOMY     COLONOSCOPY  10/04/2012   SLF:ONE/8 SIMPLE ADENOMA, TICS, SML IH   ESOPHAGOGASTRODUODENOSCOPY N/A 09/04/2014   Procedure: ESOPHAGOGASTRODUODENOSCOPY (EGD);  Surgeon: Margo LITTIE Haddock, MD;  Location: [facility];  Service: Endoscopy;  Laterality: N/A;  145   GALLBLADDER SURGERY     JAW BROKEN     LIPOMA REMOVALS     SEVERAL   RIGHT/LEFT HEART CATH AND CORONARY ANGIOGRAPHY N/A 10/08/2018   Procedure: RIGHT/LEFT HEART CATH AND CORONARY ANGIOGRAPHY;  Surgeon: Swaziland, Peter M, MD;  Location: [facility] INVASIVE CV LAB;  Service: Cardiovascular;  Laterality: N/A;   SUPRAVENTRICULAR TACHYCARDIA ABLATION N/A 06/22/2014   Procedure: SUPRAVENTRICULAR TACHYCARDIA ABLATION;  Surgeon: Danelle LELON Waddell, MD;  Location: [facility] CATH LAB;  Service: Cardiovascular;  Laterality:  N/A;       Home Medications    Prior to Admission medications   Medication Sig Start Date End Date Taking? Authorizing Provider  albuterol  (PROVENTIL ) (2.5 MG/3ML) 0.083% nebulizer solution Take 3 mLs (2.5 mg total) by nebulization every 6 (six) hours as needed for wheezing or shortness of breath. 12/11/23   Parrett, Madelin RAMAN, NP  albuterol  (VENTOLIN  HFA) 108 (90 Base) MCG/ACT inhaler Inhale 2 puffs into the lungs every 6 (six) hours as needed for wheezing or shortness of breath. 08/09/23    Milian, Marry Lenis, FNP  atorvastatin  (LIPITOR) 40 MG tablet Take 1 tablet (40 mg total) by mouth daily. 01/21/24   Jolinda Norene HERO, DO  budesonide -glycopyrrolate-formoterol  (BREZTRI  AEROSPHERE) 160-9-4.8 MCG/ACT AERO inhaler Inhale 2 puffs into the lungs 2 (two) times daily. 02/21/24   Jolinda Norene HERO, DO  doxazosin  (CARDURA ) 2 MG tablet Take 0.5 tablets (1 mg total) by mouth daily. 01/21/24   Jolinda Norene HERO, DO  flecainide  (TAMBOCOR ) 50 MG tablet Take 1 tablet (50 mg total) by mouth 2 (two) times daily. 04/26/23   Okey Vina GAILS, MD  furosemide  (LASIX ) 20 MG tablet TAKE ONE (1) TABLET BY MOUTH EVERY DAY 03/10/24   Jolinda Norene M, DO  metFORMIN  (GLUCOPHAGE ) 500 MG tablet Take 1 tablet (500 mg total) by mouth 2 (two) times daily. 01/21/24   Jolinda Norene HERO, DO  metoprolol  succinate (TOPROL  XL) 25 MG 24 hr tablet Take 1 tablet (25 mg total) by mouth in the morning and at bedtime. 07/19/23   Okey Vina GAILS, MD  omeprazole  (PRILOSEC) 40 MG capsule TAKE ONE (1) CAPSULE EACH DAY 02/22/24   Jolinda Norene M, DO  Semaglutide ,0.25 or 0.5MG /DOS, (OZEMPIC , 0.25 OR 0.5 MG/DOSE,) 2 MG/3ML SOPN Inject 0.25 mg into the skin once a week. 02/21/24   Jolinda Norene HERO, DO  sildenafil  (VIAGRA ) 100 MG tablet Take 0.5-1 tablets (50-100 mg total) by mouth daily as needed for erectile dysfunction. 06/25/23   Jolinda Norene HERO, DO  tamsulosin  (FLOMAX ) 0.4 MG CAPS capsule TAKE ONE CAPSULE BY MOUTH DAILY 03/10/24   Jolinda Norene HERO, DO    Family History Family History  Problem Relation Age of Onset   Emphysema Mother    COPD Mother    Early death Mother    Hyperlipidemia Father    Stroke Father    Diabetes Father    High Cholesterol Father    Colon cancer Neg Hx     Social History Social History   Tobacco Use   Smoking status: Every Day    Current packs/day: 2.00    Average packs/day: 2.0 packs/day for 45.0 years (90.0 ttl pk-yrs)    Types: Cigarettes   Smokeless tobacco: Never    Tobacco comments:    smokes 1-1.5 packs per day  Vaping Use   Vaping status: Never Used  Substance Use Topics   Alcohol use: No   Drug use: No    Comment: in the 1980s, smoke crack. None since Dec 2011.      Allergies   Patient has no known allergies.   Review of Systems Review of Systems Per HPI  Physical Exam Triage Vital Signs ED Triage Vitals [05/24/24 0846]  Encounter Vitals Group     BP 104/68     Girls Systolic BP Percentile      Girls Diastolic BP Percentile      Boys Systolic BP Percentile      Boys Diastolic BP Percentile      Pulse Rate  70     Resp 20     Temp 98.5 F (36.9 C)     Temp Source Oral     SpO2 93 %     Weight      Height      Head Circumference      Peak Flow      Pain Score 0     Pain Loc      Pain Education      Exclude from Growth Chart    No data found.  Updated Vital Signs BP 104/68 (BP Location: Right Arm)   Pulse 70   Temp 98.5 F (36.9 C) (Oral)   Resp 20   SpO2 93% Comment: copd  Visual Acuity Right Eye Distance:   Left Eye Distance:   Bilateral Distance:    Right Eye Near:   Left Eye Near:    Bilateral Near:     Physical Exam Vitals and nursing note reviewed.  Constitutional:      General: He is not in acute distress.    Appearance: Normal appearance.  HENT:     Head: Normocephalic.     Mouth/Throat:     Mouth: Mucous membranes are moist.  Eyes:     Extraocular Movements: Extraocular movements intact.     Pupils: Pupils are equal, round, and reactive to light.  Cardiovascular:     Rate and Rhythm: Normal rate and regular rhythm.     Pulses: Normal pulses.     Heart sounds: Normal heart sounds.  Pulmonary:     Effort: Pulmonary effort is normal. No respiratory distress.     Breath sounds: Normal breath sounds. No stridor. No wheezing, rhonchi or rales.  Abdominal:     General: Bowel sounds are normal.     Palpations: Abdomen is soft.     Tenderness: There is no abdominal tenderness. There is no right  CVA tenderness or left CVA tenderness.  Musculoskeletal:     Cervical back: Normal range of motion.  Skin:    General: Skin is warm and dry.  Neurological:     General: No focal deficit present.     Mental Status: He is alert and oriented to person, place, and time.  Psychiatric:        Mood and Affect: Mood normal.        Behavior: Behavior normal.      UC Treatments / Results  Labs (all labs ordered are listed, but only abnormal results are displayed) Labs Reviewed  POCT URINE DIPSTICK - Abnormal; Notable for the following components:      Result Value   Clarity, UA cloudy (*)    Blood, UA trace-intact (*)    POC PROTEIN,UA =100 (*)    Leukocytes, UA Large (3+) (*)    All other components within normal limits  URINE CULTURE    EKG   Radiology No results found.  Procedures Procedures (including critical care time)  Medications Ordered in UC Medications - No data to display  Initial Impression / Assessment and Plan / UC Course  I have reviewed the triage vital signs and the nursing notes.  Pertinent labs & imaging results that were available during my care of the patient were reviewed by me and considered in my medical decision making (see chart for details).  Urinalysis is positive for leukocytes, blood and protein, suggestive of a UTI.  Urine culture is pending.  There is concern that patient may have prostate issues, per review of his chart,  he does have a history of BPH, discussion with patient and strongly advised follow-up with urology as soon as possible.  In the interim, will start patient on Bactrim  DS 800/160 mg tablets twice daily.  He is currently taking tamsulosin , patient advised to continue.  Supportive care recommendations were provided and discussed with the patient to include increasing his fluids, over-the-counter analgesics, developing a toileting schedule, and to monitor for worsening symptoms.  Patient was advised that if he becomes unable to  urinate, recommend that he follow-up in the emergency department immediately.  Patient was in agreement with this plan of care and verbalizes understanding.  All questions were answered.  Patient stable for discharge.   Final Clinical Impressions(s) / UC Diagnoses   Final diagnoses:  UTI symptoms   Discharge Instructions   None    ED Prescriptions   None    PDMP not reviewed this encounter.   Gilmer Etta PARAS, NP 05/24/24 [phone]

## 2024-05-24 NOTE — Discharge Instructions (Addendum)
 A urine culture has been ordered.  You will be contacted when the results of the culture are received.  As discussed, it is very important that you follow-up with urology as soon as possible for further evaluation. Take medication as prescribed.  Continue the tamsulosin  you are currently taking. Increase your fluid intake.  Try to drink at least 8-10 8 ounce glasses of water  daily while symptoms persist. Develop a toileting schedule that will allow you to urinate at least every 2 hours. Avoid caffeine such as tea, soda, or coffee while symptoms persist. If you become unable to urinate, please go to the emergency department immediately for further evaluation. Follow-up with your primary care physician if you are unable to see the urologist. Follow-up as needed.

## 2024-05-24 NOTE — ED Triage Notes (Signed)
 Pt reports he can not get his urine out, frequent urination, and  painful urination x 3 days   Last urine this morning small stream.

## 2024-05-27 ENCOUNTER — Encounter: Payer: Self-pay | Admitting: Family Medicine

## 2024-05-27 ENCOUNTER — Ambulatory Visit (HOSPITAL_COMMUNITY): Payer: Self-pay

## 2024-05-27 ENCOUNTER — Ambulatory Visit (INDEPENDENT_AMBULATORY_CARE_PROVIDER_SITE_OTHER): Payer: Self-pay | Admitting: Family Medicine

## 2024-05-27 VITALS — BP 100/62 | HR 58 | Temp 96.8°F | Ht 65.0 in | Wt 311.4 lb

## 2024-05-27 DIAGNOSIS — N401 Enlarged prostate with lower urinary tract symptoms: Secondary | ICD-10-CM

## 2024-05-27 DIAGNOSIS — R3912 Poor urinary stream: Secondary | ICD-10-CM

## 2024-05-27 DIAGNOSIS — N3001 Acute cystitis with hematuria: Secondary | ICD-10-CM

## 2024-05-27 LAB — URINE CULTURE: Culture: >=100000 — AB

## 2024-05-27 MED ORDER — NITROFURANTOIN MONOHYD MACRO 100 MG PO CAPS: 100.0000 mg | ORAL_CAPSULE | Freq: Two times a day (BID) | ORAL | 0 refills | Status: AC

## 2024-05-27 NOTE — Progress Notes (Signed)
 Subjective:  Patient ID: Thomas Ball, male    DOB: [birth year], 63 y.o.   MRN: [id number]  Patient Care Team: Jolinda Norene HERO, DO as PCP - General (Family Medicine) Okey Vina GAILS, MD as PCP - Cardiology (Cardiology) Waddell Danelle LELON, MD as PCP - Electrophysiology (Cardiology) Ladora Ross Lacy Phebe, MD as Referring Physician (Optometry) Parrett, Madelin RAMAN, NP as Nurse Practitioner (Pulmonary Disease)   Chief Complaint:  Follow-up (05/24/2024 [phone] minutes)/[facility]- UTI)   HPI: Thomas Ball is a 63 y.o. male presenting on 05/27/2024 for Follow-up (05/24/2024 (43 minutes)/[facility]- UTI)   Thomas Ball is a 63 year old male who presents for follow-up after being treated for a urinary tract infection and to discuss prostate issues.  He was seen at urgent care for a urinary tract infection and was prescribed Bactrim . He has been taking the antibiotic for three days and has two or three days left of the course. Initially, he experienced difficulty urinating and pain, but there has been improvement in symptoms since starting the antibiotic. He is now able to urinate, although not as well as he would like, and the pain has subsided. No blood in his urine.  A urine culture performed at urgent care showed that the bacteria causing the infection is resistant to Bactrim  and Cipro. He has not been contacted by urgent care regarding a change in antibiotics.  He has a history of difficulty with urine flow and takes medications to assist with this, including a blood pressure pill and a diuretic. He has not previously seen a urologist, although he was supposed to several months ago but missed the appointment due to forgetting to follow up.          Relevant past medical, surgical, family, and social history reviewed and updated as indicated.  Allergies and medications reviewed and updated. Data reviewed: Chart in Epic.   Past Medical  History:  Diagnosis Date   GERD (gastroesophageal reflux disease)    HCV (hepatitis C virus) DR. ZACKS   AUG 2013 VIRAL LOAD UNDETECTABLE   History of cardiac catheterization    a. 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   CHOLECYSTECTOMY     COLONOSCOPY  10/04/2012   SLF:ONE/8 SIMPLE ADENOMA, TICS, SML IH   ESOPHAGOGASTRODUODENOSCOPY N/A 09/04/2014   Procedure: ESOPHAGOGASTRODUODENOSCOPY (EGD);  Surgeon: Margo LITTIE Haddock, MD;  [facility];  Service: Endoscopy;  Laterality: N/A;  145   GALLBLADDER SURGERY     JAW BROKEN     LIPOMA REMOVALS     SEVERAL   RIGHT/LEFT HEART CATH AND CORONARY ANGIOGRAPHY N/A 10/08/2018   Procedure: RIGHT/LEFT HEART CATH AND CORONARY ANGIOGRAPHY;  Surgeon: Swaziland, Peter M, MD;  Location: [facility] INVASIVE CV LAB;  Service: Cardiovascular;  Laterality: N/A;   SUPRAVENTRICULAR TACHYCARDIA ABLATION N/A 06/22/2014   Procedure: SUPRAVENTRICULAR TACHYCARDIA ABLATION;  Surgeon: Danelle LELON Waddell, MD;  Location: [facility] CATH LAB;  Service: Cardiovascular;  Laterality: N/A;    Social History   Socioeconomic History   Marital status: Widowed    Spouse name: Not  on file   Number of children: Not on file   Years of education: Not on file   Highest education level: Not on file  Occupational History   Occupation: concrete work    Associate Professor: SELF-EMPLOYED  Tobacco Use   Smoking status: Every Day    Current packs/day: 2.00    Average packs/day: 2.0 packs/day for 45.0 years (90.0 ttl pk-yrs)    Types: Cigarettes   Smokeless tobacco: Never   Tobacco comments:    smokes 1-1.5 packs per day  Vaping Use   Vaping status: Never Used  Substance and Sexual Activity   Alcohol use:  No   Drug use: No    Comment: in the 1980s, smoke crack. None since Dec 2011.    Sexual activity: Not Currently  Other Topics Concern   Not on file  Social History Narrative   Not on file   Social Drivers of Health   Financial Resource Strain: Not on file  Food Insecurity: Not on file  Transportation Needs: Not on file  Physical Activity: Not on file  Stress: Not on file  Social Connections: Not on file  Intimate Partner Violence: Not on file    Outpatient Encounter Medications as of 05/27/2024  Medication Sig   albuterol  (PROVENTIL ) (2.5 MG/3ML) 0.083% nebulizer solution Take 3 mLs (2.5 mg total) by nebulization every 6 (six) hours as needed for wheezing or shortness of breath.   albuterol  (VENTOLIN  HFA) 108 (90 Base) MCG/ACT inhaler Inhale 2 puffs into the lungs every 6 (six) hours as needed for wheezing or shortness of breath.   atorvastatin  (LIPITOR) 40 MG tablet Take 1 tablet (40 mg total) by mouth daily.   budesonide -glycopyrrolate-formoterol  (BREZTRI  AEROSPHERE) 160-9-4.8 MCG/ACT AERO inhaler Inhale 2 puffs into the lungs 2 (two) times daily.   doxazosin  (CARDURA ) 2 MG tablet Take 0.5 tablets (1 mg total) by mouth daily.   flecainide  (TAMBOCOR ) 50 MG tablet Take 1 tablet (50 mg total) by mouth 2 (two) times daily.   furosemide  (LASIX ) 20 MG tablet TAKE ONE (1) TABLET BY MOUTH EVERY DAY   metFORMIN  (GLUCOPHAGE ) 500 MG tablet Take 1 tablet (500 mg total) by mouth 2 (two) times daily.   metoprolol  succinate (TOPROL  XL) 25 MG 24 hr tablet Take 1 tablet (25 mg total) by mouth in the morning and at bedtime.   nitrofurantoin , macrocrystal-monohydrate, (MACROBID ) 100 MG capsule Take 1 capsule (100 mg total) by mouth 2 (two) times daily for 7 days.   omeprazole  (PRILOSEC) 40 MG capsule TAKE ONE (1) CAPSULE EACH DAY   Semaglutide ,0.25 or 0.5MG /DOS, (OZEMPIC , 0.25 OR 0.5 MG/DOSE,) 2 MG/3ML SOPN Inject 0.25 mg into the skin once a week.   sildenafil  (VIAGRA ) 100 MG tablet Take 0.5-1  tablets (50-100 mg total) by mouth daily as needed for erectile dysfunction.   sulfamethoxazole -trimethoprim  (BACTRIM  DS) 800-160 MG tablet Take 1 tablet by mouth 2 (two) times daily for 7 days.   tamsulosin  (FLOMAX ) 0.4 MG CAPS capsule TAKE ONE CAPSULE BY MOUTH DAILY   No facility-administered encounter medications on file as of 05/27/2024.    No Known Allergies  Pertinent ROS per HPI, otherwise unremarkable      Objective:  BP 100/62   Pulse (!) 58   Temp (!) 96.8 F (36 C)   Ht 5' 5 (1.651 m)   Wt (!) 311 lb 6.4 oz (141.3 kg)   SpO2 91%   BMI 51.82 kg/m    Wt Readings from Last 3 Encounters:  05/27/24 (!) 311 lb  6.4 oz (141.3 kg)  05/07/24 (!) 309 lb 11.2 oz (140.5 kg)  04/22/24 (!) 316 lb (143.3 kg)    Physical Exam Vitals and nursing note reviewed.  Constitutional:      General: He is not in acute distress.    Appearance: Normal appearance. He is morbidly obese. He is not ill-appearing, toxic-appearing or diaphoretic.  HENT:     Head: Normocephalic and atraumatic.     Mouth/Throat:     Mouth: Mucous membranes are moist.  Eyes:     Conjunctiva/sclera: Conjunctivae normal.     Pupils: Pupils are equal, round, and reactive to light.  Cardiovascular:     Rate and Rhythm: Normal rate and regular rhythm.     Heart sounds: Normal heart sounds.  Pulmonary:     Effort: Pulmonary effort is normal.     Breath sounds: Normal breath sounds.  Musculoskeletal:     Right lower leg: Edema (trace) present.     Left lower leg: Edema (trace) present.  Skin:    General: Skin is warm and dry.     Capillary Refill: Capillary refill takes less than 2 seconds.  Neurological:     General: No focal deficit present.     Mental Status: He is alert and oriented to person, place, and time.  Psychiatric:        Mood and Affect: Mood normal.        Behavior: Behavior normal. Behavior is cooperative.        Thought Content: Thought content normal.        Judgment: Judgment normal.       Results for orders placed or performed during the hospital encounter of 05/24/24  POCT URINE DIPSTICK   Collection Time: 05/24/24  8:58 AM  Result Value Ref Range   Color, UA yellow yellow   Clarity, UA cloudy (A) clear   Glucose, UA negative negative mg/dL   Bilirubin, UA negative negative   Ketones, POC UA negative negative mg/dL   Spec Grav, UA 8.979 8.989 - 1.025   Blood, UA trace-intact (A) negative   pH, UA 7.0 5.0 - 8.0   POC PROTEIN,UA =100 (A) negative, trace   Urobilinogen, UA 1.0 0.2 or 1.0 E.U./dL   Nitrite, UA Negative Negative   Leukocytes, UA Large (3+) (A) Negative  Urine Culture   Collection Time: 05/24/24  9:10 AM   Specimen: Urine, Clean Catch  Result Value Ref Range   Specimen Description      URINE, CLEAN CATCH Performed at [facility], [address] [place], [place] [address]    Special Requests      NONE Performed at [facility], [address] [place], [place] [address]    Culture >=100,000 COLONIES/mL STAPHYLOCOCCUS EPIDERMIDIS (A)    Report Status 05/27/2024 FINAL    Organism ID, Bacteria STAPHYLOCOCCUS EPIDERMIDIS (A)       Susceptibility   Staphylococcus epidermidis - MIC*    CIPROFLOXACIN 4 RESISTANT Resistant     GENTAMICIN <=0.5 SENSITIVE Sensitive     NITROFURANTOIN  32 SENSITIVE Sensitive     OXACILLIN <=0.25 SENSITIVE Sensitive     TETRACYCLINE 4 SENSITIVE Sensitive     VANCOMYCIN 1 SENSITIVE Sensitive     TRIMETH /SULFA  80 RESISTANT Resistant     RIFAMPIN <=0.5 SENSITIVE Sensitive     Inducible Clindamycin NEGATIVE Sensitive     * >=100,000 COLONIES/mL STAPHYLOCOCCUS EPIDERMIDIS       Pertinent labs & imaging results that were available during my care of the  patient were reviewed by me and considered in my medical decision making.  Assessment & Plan:  Blaise was seen today for follow-up.  Diagnoses and all orders for this visit:  Acute cystitis with hematuria -     Ambulatory referral to Urology -     nitrofurantoin ,  macrocrystal-monohydrate, (MACROBID ) 100 MG capsule; Take 1 capsule (100 mg total) by mouth 2 (two) times daily for 7 days.  Benign prostatic hyperplasia with weak urinary stream -     Ambulatory referral to Urology     Urinary tract infection due to Staphylococcus epidermidis The infection is resistant to Bactrim  and Cipro. Symptoms have improved with Bactrim , but it is not effective for complete eradication of the bacteria. Macrobid  is indicated based on urine culture sensitivity. - Discontinue Bactrim . - Prescribe Macrobid  100 mg twice a day for 7 days. - Ensure completion of the full course of Macrobid .  Benign prostatic hyperplasia with lower urinary tract symptoms Chronic lower urinary tract symptoms associated with benign prostatic hyperplasia. Symptoms include difficulty with urine flow, which may contribute to urinary tract infections. - Refer to urology for further evaluation and management of BPH. - Instruct to set up a visit with urology at [facility], with locations in [place] or [place].          Continue all other maintenance medications.  Follow up plan: Return if symptoms worsen or fail to improve.   Continue healthy lifestyle choices, including diet (rich in fruits, vegetables, and lean proteins, and low in salt and simple carbohydrates) and exercise (at least 30 minutes of moderate physical activity daily).   The above assessment and management plan was discussed with the patient. The patient verbalized understanding of and has agreed to the management plan. Patient is aware to call the clinic if they develop any new symptoms or if symptoms persist or worsen. Patient is aware when to return to the clinic for a follow-up visit. Patient educated on when it is appropriate to go to the emergency department.   Rosaline Bruns, FNP-C [facility] [phone]

## 2024-05-30 ENCOUNTER — Ambulatory Visit (HOSPITAL_COMMUNITY)
Admission: RE | Admit: 2024-05-30 | Discharge: 2024-05-30 | Disposition: A | Discharge status: Home / Self Care | Payer: Self-pay | Source: Ambulatory Visit | Attending: Family Medicine | Admitting: Family Medicine

## 2024-05-30 DIAGNOSIS — F17210 Nicotine dependence, cigarettes, uncomplicated: Secondary | ICD-10-CM | POA: Insufficient documentation

## 2024-05-30 DIAGNOSIS — Z87891 Personal history of nicotine dependence: Secondary | ICD-10-CM | POA: Insufficient documentation

## 2024-05-30 DIAGNOSIS — Z122 Encounter for screening for malignant neoplasm of respiratory organs: Secondary | ICD-10-CM | POA: Insufficient documentation

## 2024-06-04 ENCOUNTER — Telehealth: Payer: Self-pay | Admitting: Family Medicine

## 2024-06-04 ENCOUNTER — Telehealth: Payer: Self-pay | Admitting: *Deleted

## 2024-06-04 NOTE — Telephone Encounter (Signed)
 Pt called inquiring about his lab work and US . Message was sent to [employer].   Per Dr.Carver: Blood work looked good. US  did not show liver lesion. repeat in 6 months .

## 2024-06-04 NOTE — Telephone Encounter (Signed)
 LMOVM to return call.

## 2024-06-04 NOTE — Telephone Encounter (Unsigned)
 Copied from CRM [id number]. Topic: Clinical - Medication Question >> Jun 04, 2024 10:22 AM Ivette P wrote: Reason for CRM: pt calling in because he is running out of Semaglutide ,0.25 or 0.5MG /DOS, (OZEMPIC , 0.25 OR 0.5 MG/DOSE,) 2 MG/3ML SOPN   Pt has 1 week left and wondering if Mliss will get more samples.

## 2024-06-04 NOTE — Telephone Encounter (Signed)
 Pt is cancelling his procedure on 06/09/24 due to issues going on with his wife. He asked to be rescheduled for Oct or Nov. We will call him back to get rescheduled.

## 2024-06-05 ENCOUNTER — Encounter (HOSPITAL_COMMUNITY): Admission: RE | Admit: 2024-06-05 | Payer: Self-pay | Source: Ambulatory Visit

## 2024-06-05 ENCOUNTER — Encounter: Payer: Self-pay | Admitting: *Deleted

## 2024-06-05 ENCOUNTER — Other Ambulatory Visit (INDEPENDENT_AMBULATORY_CARE_PROVIDER_SITE_OTHER): Payer: Self-pay | Admitting: Pharmacist

## 2024-06-05 DIAGNOSIS — Z7985 Long-term (current) use of injectable non-insulin antidiabetic drugs: Secondary | ICD-10-CM

## 2024-06-05 DIAGNOSIS — E119 Type 2 diabetes mellitus without complications: Secondary | ICD-10-CM

## 2024-06-05 NOTE — Progress Notes (Signed)
 06/05/2024 Name: Thomas Ball MRN: [id number] DOB: [birth year]  Chief Complaint  Patient presents with   Diabetes    Thomas Ball is a 63 y.o. year old male who presented for a telephone visit.  I connected with  [name] on 06/05/24 by telephone and verified that I am speaking with the correct person using two identifiers. I discussed the limitations of evaluation and management by telemedicine. The patient expressed understanding and agreed to proceed.  Patient was located in her home and PharmD in PCP office during this visit.;    They were referred to the pharmacist by their PCP for assistance in managing diabetes and medication access.    Subjective:  Patient reports he is tolerating Ozempic  0.25mg  weekly with no reported side effects.    Care Team: Primary Care Provider: Jolinda Norene HERO, DO    Medication Access/Adherence  Current Pharmacy:  [facility] - [place], [place] - [address] [place] [address] Phone: [phone] Fax: [phone]  [facility] - [place], [place] - [address] [place] [address] Phone: [phone] Fax: [phone]   Patient reports affordability concerns with their medications: Yes  Patient reports access/transportation concerns to their pharmacy: No  Patient reports adherence concerns with their medications:  No     Diabetes:  Current medications: metformin , Ozempic  0.25mg  weekly  Medications tried in the past: n/a  Current glucose readings: FBG<130 Using traditional glucometer daily  Current meal patterns:  The patient is asked to make an attempt to improve diet and exercise patterns to aid in medical management of this problem. Discussed meal planning options and Plate method for healthy eating Avoid sugary drinks and desserts Incorporate balanced protein, non starchy veggies, 1 serving of carbohydrate with each meal Increase water  intake Increase physical  activity as able  Current physical activity: increase as able  Current medication access support: approved for NOVO PAP for ozempic    Objective:  Lab Results  Component Value Date   HGBA1C 6.6 (H) 01/21/2024    Lab Results  Component Value Date   CREATININE 0.89 05/08/2024   BUN 18 05/08/2024   NA 137 05/08/2024   K 4.6 05/08/2024   CL 99 05/08/2024   CO2 24 05/08/2024    Lab Results  Component Value Date   CHOL 97 (L) 01/21/2024   HDL 41 01/21/2024   LDLCALC 39 01/21/2024   TRIG 82 01/21/2024   CHOLHDL 2.4 01/21/2024    Medications Reviewed Today     Reviewed by Billee Mliss BIRCH, [facility] (Pharmacist) on 06/05/24 at 1415  Med List Status: <None>   Medication Order Taking? Sig Documenting Provider Last Dose Status Informant  albuterol  (PROVENTIL ) (2.5 MG/3ML) 0.083% nebulizer solution [id number]  Take 3 mLs (2.5 mg total) by nebulization every 6 (six) hours as needed for wheezing or shortness of breath. Parrett, Madelin RAMAN, NP  Active   albuterol  (VENTOLIN  HFA) 108 (90 Base) MCG/ACT inhaler [id number]  Inhale 2 puffs into the lungs every 6 (six) hours as needed for wheezing or shortness of breath. Cathlene Marry Lenis, FNP  Active   atorvastatin  (LIPITOR) 40 MG tablet [id number]  Take 1 tablet (40 mg total) by mouth daily. Jolinda Norene M, DO  Active   budesonide -glycopyrrolate-formoterol  (BREZTRI  AEROSPHERE) 160-9-4.8 MCG/ACT AERO inhaler [id number]  Inhale 2 puffs into the lungs 2 (two) times daily. Jolinda Norene HERO, DO  Active  Med Note (Bakari Nikolai D   Thu Jun 05, 2024  2:15 PM)    doxazosin  (CARDURA ) 2 MG tablet [id number]  Take 0.5 tablets (1 mg total) by mouth daily. Jolinda Potter M, DO  Active   flecainide  (TAMBOCOR ) 50 MG tablet [id number]  Take 1 tablet (50 mg total) by mouth 2 (two) times daily. Okey Vina GAILS, MD  Active   furosemide  (LASIX ) 20 MG tablet [id number]  TAKE ONE (1) TABLET BY MOUTH EVERY DAY Jolinda Potter M, DO  Active   metFORMIN   (GLUCOPHAGE ) 500 MG tablet [id number]  Take 1 tablet (500 mg total) by mouth 2 (two) times daily. Jolinda Potter M, DO  Active   metoprolol  succinate (TOPROL  XL) 25 MG 24 hr tablet [id number]  Take 1 tablet (25 mg total) by mouth in the morning and at bedtime. Okey Vina GAILS, MD  Active   omeprazole  [facility]) 40 MG capsule [id number]  TAKE ONE (1) CAPSULE EACH DAY [place], [place] M, [place]  Active   Semaglutide ,0.25 or 0.5MG /DOS, (OZEMPIC , 0.25 OR 0.5 MG/DOSE,) 2 MG/3ML SOPN [id number] Yes Inject 0.25 mg into the skin once a week.  Patient taking differently: Inject 0.5 mg into the skin once a week.   Jolinda Potter M, DO  Active            Med Note TENA, Cheryn Lundquist D   Fri Feb 29, 2024  3:04 PM) Via novo nordisk patient assistance program    sildenafil  (VIAGRA ) 100 MG tablet [id number]  Take 0.5-1 tablets (50-100 mg total) by mouth daily as needed for erectile dysfunction. Jolinda Potter M, DO  Active   tamsulosin  (FLOMAX ) 0.4 MG CAPS capsule [id number]  TAKE ONE CAPSULE BY MOUTH DAILY Jolinda Potter HERO, DO  Active   Med List Note Christie, Alyson, CPhT 05/04/21 1200): Cpap             Assessment/Plan:   Diabetes: - Currently controlled - Reviewed long term cardiovascular and renal outcomes of uncontrolled blood sugar - Reviewed goal A1c, goal fasting, and goal 2 hour post prandial glucose - Recommend to : INCREASE Ozempic  to 0.5mg  weekly weekly when shipment arrives Adjust metformin  as needed--may be able to d/c  - Patient denies personal or family history of multiple endocrine neoplasia type 2, medullary thyroid  cancer; personal history of pancreatitis or gallbladder disease. - Recommend to check glucose daily (fasting) or if symptomatic - Meets financial criteria for ozempic  patient assistance program through novo nordisk. Patient approved  COPD: Left Breztri  samples up front-->patient states triple therapy has helped tremendously  Denied for [place] PAP (breztri ) Can't  appeal due to patient unable to apply for medicaid again (previously denied, but can't find letter)--message sent to CPhT to assist    Follow Up Plan: 1 month  Mliss Tarry Griffin, PharmD, BCACP, CPP Clinical Pharmacist, [facility]

## 2024-06-05 NOTE — Telephone Encounter (Signed)
 Pt was informed of providers message regarding results. He has been rescheduled for his procedure on 07/21/24. Updated instructions mailed

## 2024-06-09 ENCOUNTER — Ambulatory Visit (HOSPITAL_COMMUNITY): Admission: RE | Admit: 2024-06-09 | Payer: Self-pay | Source: Home / Self Care | Admitting: Internal Medicine

## 2024-06-09 ENCOUNTER — Encounter (HOSPITAL_COMMUNITY): Admission: RE | Payer: Self-pay | Source: Home / Self Care

## 2024-06-09 SURGERY — COLONOSCOPY
Anesthesia: Choice

## 2024-06-10 ENCOUNTER — Telehealth: Payer: Self-pay

## 2024-06-10 NOTE — Progress Notes (Signed)
 Complex Care Management Care Guide Note  06/10/2024 Name: Thomas Ball MRN: [id number] DOB: 02-Dec-1960  Thomas Ball is a 63 y.o. year old male who is a primary care patient of Jolinda Norene HERO, DO and is actively engaged with the care management team. I reached out to Ryan LELON Ege by phone today to assist with re-scheduling  with the Pharmacist.  Follow up plan: Unsuccessful telephone outreach attempt made. A HIPAA compliant phone message was left for the patient providing contact information and requesting a return call.  Jeoffrey Buffalo , RMA     [facility] Guide  Direct Dial: [phone]  Website: [email or web address]

## 2024-06-11 ENCOUNTER — Ambulatory Visit: Payer: Self-pay | Admitting: Internal Medicine

## 2024-06-13 ENCOUNTER — Other Ambulatory Visit: Payer: Self-pay | Admitting: Acute Care

## 2024-06-13 DIAGNOSIS — Z122 Encounter for screening for malignant neoplasm of respiratory organs: Secondary | ICD-10-CM

## 2024-06-13 DIAGNOSIS — F17210 Nicotine dependence, cigarettes, uncomplicated: Secondary | ICD-10-CM

## 2024-06-13 DIAGNOSIS — Z87891 Personal history of nicotine dependence: Secondary | ICD-10-CM

## 2024-06-16 ENCOUNTER — Ambulatory Visit (INDEPENDENT_AMBULATORY_CARE_PROVIDER_SITE_OTHER): Payer: Self-pay | Admitting: Urology

## 2024-06-16 ENCOUNTER — Encounter: Payer: Self-pay | Admitting: Urology

## 2024-06-16 VITALS — BP 111/72 | HR 65

## 2024-06-16 DIAGNOSIS — R338 Other retention of urine: Secondary | ICD-10-CM

## 2024-06-16 DIAGNOSIS — R339 Retention of urine, unspecified: Secondary | ICD-10-CM

## 2024-06-16 DIAGNOSIS — N401 Enlarged prostate with lower urinary tract symptoms: Secondary | ICD-10-CM

## 2024-06-16 DIAGNOSIS — N3000 Acute cystitis without hematuria: Secondary | ICD-10-CM

## 2024-06-16 LAB — BLADDER SCAN AMB NON-IMAGING: Scan Result: 616

## 2024-06-16 MED ORDER — TAMSULOSIN HCL 0.4 MG PO CAPS: 0.4000 mg | ORAL_CAPSULE | Freq: Two times a day (BID) | ORAL | 11 refills | Status: AC

## 2024-06-16 NOTE — Progress Notes (Signed)
 NIC the US  - done

## 2024-06-16 NOTE — Patient Instructions (Signed)

## 2024-06-16 NOTE — Progress Notes (Signed)
 Bladder Scan completed today.  Patient can void prior to the bladder scan. Bladder scan result: [id number]  Performed By: [facility] LPN

## 2024-06-16 NOTE — Progress Notes (Signed)
 06/16/2024 2:35 PM   Thomas Ball March 24, 1961 [id number]  Referring provider: Severa Rock HERO, FNP [address] [place],  [place] [address]  Difficulty urinating   HPI: Thomas Ball is a 62yo here for evaluation of difficulty urinating. He was seen in the urgent care 05/24/24 and was diagnosed with a UTI and started on antibiotics. He was on flomax  for the past 3 months. The urine stream is intermittently weak. Nocturia 3-5x. IPSS 20 QOL 4. He has a feeling of incomplete emptying. PVR 616.  He has had DMII for the past 2 years.    PMH: Past Medical History:  Diagnosis Date   GERD (gastroesophageal reflux disease)    HCV (hepatitis C virus) DR. ZACKS   AUG 2013 VIRAL LOAD UNDETECTABLE   History of cardiac catheterization    a. 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   CHOLECYSTECTOMY     COLONOSCOPY  10/04/2012   SLF:ONE/8 SIMPLE ADENOMA, TICS, SML IH   ESOPHAGOGASTRODUODENOSCOPY N/A 09/04/2014   Procedure: ESOPHAGOGASTRODUODENOSCOPY (EGD);  Surgeon: Margo LITTIE Haddock, MD;  [facility]: [facility];  Service: Endoscopy;  Laterality: N/A;  145   GALLBLADDER SURGERY     JAW BROKEN     LIPOMA REMOVALS     SEVERAL   RIGHT/LEFT HEART CATH AND CORONARY ANGIOGRAPHY N/A 10/08/2018   Procedure: RIGHT/LEFT HEART CATH AND CORONARY ANGIOGRAPHY;  Surgeon: Swaziland, Peter M, MD;  Location: [facility] INVASIVE CV LAB;  Service: Cardiovascular;  Laterality: N/A;   SUPRAVENTRICULAR TACHYCARDIA ABLATION N/A 06/22/2014   Procedure: SUPRAVENTRICULAR TACHYCARDIA ABLATION;  Surgeon: Danelle ORN Waddell, MD;  Location: [facility] CATH LAB;  Service: Cardiovascular;  Laterality: N/A;    Home Medications:  Allergies as of  06/16/2024   No Known Allergies      Medication List        Accurate as of June 16, 2024  2:35 PM. If you have any questions, ask your nurse or doctor.          albuterol  108 (90 Base) MCG/ACT inhaler Commonly known as: VENTOLIN  HFA Inhale 2 puffs into the lungs every 6 (six) hours as needed for wheezing or shortness of breath.   albuterol  (2.5 MG/3ML) 0.083% nebulizer solution Commonly known as: PROVENTIL  Take 3 mLs (2.5 mg total) by nebulization every 6 (six) hours as needed for wheezing or shortness of breath.   atorvastatin  40 MG tablet Commonly known as: LIPITOR Take 1 tablet (40 mg total) by mouth daily.   Breztri  Aerosphere 160-9-4.8 MCG/ACT Aero inhaler Generic drug: budesonide -glycopyrrolate-formoterol  Inhale 2 puffs into the lungs 2 (two) times daily.   doxazosin  2 MG tablet Commonly known as: CARDURA  Take 0.5 tablets (1 mg total) by mouth daily.   flecainide  50 MG tablet Commonly known as: TAMBOCOR  Take 1 tablet (50 mg total) by mouth 2 (two) times daily.   furosemide  20 MG tablet Commonly known as: LASIX  TAKE ONE (1) TABLET BY MOUTH EVERY DAY   metFORMIN  500 MG tablet Commonly known as: GLUCOPHAGE  Take 1 tablet (500 mg total) by mouth 2 (two) times daily.   metoprolol  succinate 25 MG 24 hr tablet Commonly known as:  Toprol  XL Take 1 tablet (25 mg total) by mouth in the morning and at bedtime.   omeprazole  40 MG capsule Commonly known as: PRILOSEC TAKE ONE (1) CAPSULE EACH DAY   Ozempic  (0.25 or 0.5 MG/DOSE) 2 MG/3ML Sopn Generic drug: Semaglutide (0.25 or 0.5MG /DOS) Inject 0.25 mg into the skin once a week. What changed: how much to take   sildenafil  100 MG tablet Commonly known as: Viagra  Take 0.5-1 tablets (50-100 mg total) by mouth daily as needed for erectile dysfunction.   tamsulosin  0.4 MG Caps capsule Commonly known as: FLOMAX  TAKE ONE CAPSULE BY MOUTH DAILY        Allergies: No Known Allergies  Family History: Family  History  Problem Relation Age of Onset   Emphysema Mother    COPD Mother    Early death Mother    Hyperlipidemia Father    Stroke Father    Diabetes Father    High Cholesterol Father    Colon cancer Neg Hx     Social History:  reports that he has been smoking cigarettes. He has a 90 pack-year smoking history. He has never used smokeless tobacco. He reports that he does not drink alcohol and does not use drugs.  ROS: All other review of systems were reviewed and are negative except what is noted above in HPI  Physical Exam: BP 111/72   Pulse 65   Constitutional:  Alert and oriented, No acute distress. HEENT: [place] AT, moist mucus membranes.  Trachea midline, no masses. Cardiovascular: No clubbing, cyanosis, or edema. Respiratory: Normal respiratory effort, no increased work of breathing. GI: Abdomen is soft, nontender, nondistended, no abdominal masses GU: No CVA tenderness.  Lymph: No cervical or inguinal lymphadenopathy. Skin: No rashes, bruises or suspicious lesions. Neurologic: Grossly intact, no focal deficits, moving all 4 extremities. Psychiatric: Normal mood and affect.  Laboratory Data: Lab Results  Component Value Date   WBC 5.1 01/21/2024   HGB 13.4 01/21/2024   HCT 40.8 01/21/2024   MCV 96 01/21/2024   PLT 140 (L) 01/21/2024    Lab Results  Component Value Date   CREATININE 0.89 05/08/2024    No results found for: PSA  No results found for: TESTOSTERONE  Lab Results  Component Value Date   HGBA1C 6.6 (H) 01/21/2024    Urinalysis    Component Value Date/Time   COLORURINE YELLOW 06/25/2014 1652   APPEARANCEUR Cloudy (A) 11/19/2023 1436   LABSPEC 1.020 06/25/2014 1652   PHURINE 6.5 06/25/2014 1652   GLUCOSEU Negative 11/19/2023 1436   HGBUR NEGATIVE 06/25/2014 1652   BILIRUBINUR negative 05/24/2024 0858   BILIRUBINUR Negative 11/19/2023 1436   KETONESUR negative 05/24/2024 0858   KETONESUR NEGATIVE 06/25/2014 1652   PROTEINUR 2+ (A)  11/19/2023 1436   PROTEINUR NEGATIVE 06/25/2014 1652   UROBILINOGEN 1.0 05/24/2024 0858   UROBILINOGEN 1.0 06/25/2014 1652   NITRITE Negative 05/24/2024 0858   NITRITE Negative 11/19/2023 1436   NITRITE NEGATIVE 06/25/2014 1652   LEUKOCYTESUR Large (3+) (A) 05/24/2024 0858   LEUKOCYTESUR 3+ (A) 11/19/2023 1436    Lab Results  Component Value Date   LABMICR See below: 11/19/2023   WBCUA >30 (A) 11/19/2023   LABEPIT 0-10 11/19/2023   BACTERIA Many (A) 11/19/2023    Pertinent Imaging:  No results found for this or any previous visit.  No results found for this or any previous visit.  No results found for this or any previous visit.  No results found for this or any previous visit.  No  results found for this or any previous visit.  No results found for this or any previous visit.  No results found for this or any previous visit.  No results found for this or any previous visit.   Assessment & Plan:    1.  Benign prostatic hyperplasia with lower urinary tract symptoms, symptom details unspecified We increase flomax  to BID - Urinalysis, Routine w reflex microscopic - BLADDER SCAN AMB NON-IMAGING  3. Urinary retention -increase flomax  to BID  No follow-ups on file.  Belvie Clara, MD  [facility] [place]

## 2024-06-17 ENCOUNTER — Ambulatory Visit (INDEPENDENT_AMBULATORY_CARE_PROVIDER_SITE_OTHER): Payer: Self-pay | Admitting: Family Medicine

## 2024-06-17 ENCOUNTER — Encounter: Payer: Self-pay | Admitting: Family Medicine

## 2024-06-17 VITALS — BP 86/52 | HR 62 | Temp 97.3°F | Ht 65.0 in | Wt 314.5 lb

## 2024-06-17 DIAGNOSIS — E1159 Type 2 diabetes mellitus with other circulatory complications: Secondary | ICD-10-CM

## 2024-06-17 DIAGNOSIS — Z7985 Long-term (current) use of injectable non-insulin antidiabetic drugs: Secondary | ICD-10-CM

## 2024-06-17 DIAGNOSIS — I152 Hypertension secondary to endocrine disorders: Secondary | ICD-10-CM

## 2024-06-17 DIAGNOSIS — Z6841 Body Mass Index (BMI) 40.0 and over, adult: Secondary | ICD-10-CM

## 2024-06-17 DIAGNOSIS — E119 Type 2 diabetes mellitus without complications: Secondary | ICD-10-CM

## 2024-06-17 DIAGNOSIS — E1169 Type 2 diabetes mellitus with other specified complication: Secondary | ICD-10-CM

## 2024-06-17 DIAGNOSIS — K7469 Other cirrhosis of liver: Secondary | ICD-10-CM

## 2024-06-17 DIAGNOSIS — J432 Centrilobular emphysema: Secondary | ICD-10-CM

## 2024-06-17 DIAGNOSIS — E785 Hyperlipidemia, unspecified: Secondary | ICD-10-CM

## 2024-06-17 DIAGNOSIS — Z23 Encounter for immunization: Secondary | ICD-10-CM

## 2024-06-17 DIAGNOSIS — K5909 Other constipation: Secondary | ICD-10-CM

## 2024-06-17 LAB — URINALYSIS, ROUTINE W REFLEX MICROSCOPIC
Bilirubin, UA: NEGATIVE
Glucose, UA: NEGATIVE
Ketones, UA: NEGATIVE
Leukocytes,UA: NEGATIVE
Nitrite, UA: NEGATIVE
Protein,UA: NEGATIVE
RBC, UA: NEGATIVE
Specific Gravity, UA: 1.02 (ref 1.005–1.030)
Urobilinogen, Ur: 1 mg/dL (ref 0.2–1.0)
pH, UA: 6 (ref 5.0–7.5)

## 2024-06-17 LAB — BAYER DCA HB A1C WAIVED: HB A1C (BAYER DCA - WAIVED): 6.2 % — ABNORMAL HIGH (ref 4.8–5.6)

## 2024-06-17 MED ORDER — COVID-19 MRNA VAC-TRIS(PFIZER) 30 MCG/0.3ML IM SUSY: 0.3000 mL | PREFILLED_SYRINGE | Freq: Once | INTRAMUSCULAR | 0 refills | Status: AC

## 2024-06-17 MED ORDER — OZEMPIC (0.25 OR 0.5 MG/DOSE) 2 MG/3ML ~~LOC~~ SOPN: 0.5000 mg | PEN_INJECTOR | SUBCUTANEOUS | Status: AC

## 2024-06-17 MED ORDER — METOPROLOL SUCCINATE ER 25 MG PO TB24: 25.0000 mg | ORAL_TABLET | Freq: Two times a day (BID) | ORAL | 3 refills | Status: AC

## 2024-06-17 NOTE — Patient Instructions (Signed)
Tips for success with Ozempic (and by success, how not to be super sick on your stomach): Eat small meals AVOID heavy foods (fried/ high in carbs like bread, pasta, rice) AVOID carbonated beverages (soda/ beer, as these can increase bloating) DOUBLE your water intake (will help you avoid constipation/ dehydration)  Ozempic CAN cause: Nausea Abdominal pain Increased acid reflux (sometimes presents as "sour burps") Constipation OR Diarrhea Fatigue (especially when you first start it)

## 2024-06-17 NOTE — Progress Notes (Signed)
 Subjective: CC:DM PCP: Jolinda Thomas HERO, DO YEP:Thomas Ball is a 63 y.o. male presenting to clinic today for:  Type 2 Diabetes with hypertension, hyperlipidemia:  Patient reports compliance with meds. He just started 0.5mg  weekly.  Gave him a little nausea, no vomiting or abdominal pain.  He does admit that he is not watching what he eats and subsequently has really not lost the weight that he has expected at this point.  He did know that certain foods could cause increased nausea and abdominal pain but will be making sure to try and avoid those going forward.  Suffering from some chronic constipation and was given samples of some type of constipation medicine from his gastroenterologist but does not recall what it was.  It gave him very watery stools.  Wanting to know if there is something else he can use   Diabetes Health Maintenance Due  Topic Date Due   OPHTHALMOLOGY EXAM  08/15/2023   FOOT EXAM  03/12/2024   HEMOGLOBIN A1C  07/22/2024    ROS: Per HPI  No Known Allergies Past Medical History:  Diagnosis Date   GERD (gastroesophageal reflux disease)    HCV (hepatitis C virus) DR. ZACKS   AUG 2013 VIRAL LOAD UNDETECTABLE   History of cardiac catheterization    a. 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   Sleep apnea    SVT (supraventricular tachycardia)     Current Outpatient Medications:    albuterol  (PROVENTIL ) (2.5 MG/3ML) 0.083% nebulizer solution, Take 3 mLs (2.5 mg total) by nebulization every 6 (six) hours as needed for wheezing or shortness of breath., Disp: 150 mL, Rfl: 5   albuterol  (VENTOLIN  HFA) 108 (90 Base) MCG/ACT inhaler, Inhale 2 puffs into the lungs every 6 (six) hours as needed for wheezing or shortness of breath., Disp: 8 g, Rfl: 2   atorvastatin  (LIPITOR) 40 MG tablet, Take 1 tablet (40 mg total) by mouth daily., Disp:  90 tablet, Rfl: 3   budesonide -glycopyrrolate-formoterol  (BREZTRI  AEROSPHERE) 160-9-4.8 MCG/ACT AERO inhaler, Inhale 2 puffs into the lungs 2 (two) times daily., Disp: 32.1 g, Rfl: 4   doxazosin  (CARDURA ) 2 MG tablet, Take 0.5 tablets (1 mg total) by mouth daily., Disp: 45 tablet, Rfl: 3   flecainide  (TAMBOCOR ) 50 MG tablet, Take 1 tablet (50 mg total) by mouth 2 (two) times daily., Disp: , Rfl:    furosemide  (LASIX ) 20 MG tablet, TAKE ONE (1) TABLET BY MOUTH EVERY DAY, Disp: 30 tablet, Rfl: 3   metFORMIN  (GLUCOPHAGE ) 500 MG tablet, Take 1 tablet (500 mg total) by mouth 2 (two) times daily., Disp: 180 tablet, Rfl: 3   metoprolol  succinate (TOPROL  XL) 25 MG 24 hr tablet, Take 1 tablet (25 mg total) by mouth in the morning and at bedtime., Disp: 180 tablet, Rfl: 3   omeprazole  (PRILOSEC) 40 MG capsule, TAKE ONE (1) CAPSULE EACH DAY, Disp: 90 capsule, Rfl: 3   Semaglutide ,0.25 or 0.5MG /DOS, (OZEMPIC , 0.25 OR 0.5 MG/DOSE,) 2 MG/3ML SOPN, Inject 0.25 mg into the skin once a week. (Patient taking differently: Inject 0.5 mg into the skin once a week.), Disp: , Rfl:    sildenafil  (VIAGRA ) 100 MG tablet, Take 0.5-1 tablets (50-100 mg total) by mouth daily as needed for erectile dysfunction., Disp: 5 tablet, Rfl: 11   tamsulosin  (FLOMAX ) 0.4 MG CAPS capsule, Take 1  capsule (0.4 mg total) by mouth in the morning and at bedtime., Disp: 60 capsule, Rfl: 11 Social History   Socioeconomic History   Marital status: Widowed    Spouse name: Not on file   Number of children: Not on file   Years of education: Not on file   Highest education level: Not on file  Occupational History   Occupation: concrete work    Associate Professor: SELF-EMPLOYED  Tobacco Use   Smoking status: Every Day    Current packs/day: 2.00    Average packs/day: 2.0 packs/day for 45.0 years (90.0 ttl pk-yrs)    Types: Cigarettes   Smokeless tobacco: Never   Tobacco comments:    smokes 1-1.5 packs per day  Vaping Use   Vaping status: Never Used   Substance and Sexual Activity   Alcohol use: No   Drug use: No    Comment: in the 1980s, smoke crack. None since Dec 2011.    Sexual activity: Not Currently  Other Topics Concern   Not on file  Social History Narrative   Not on file   Social Drivers of Health   Financial Resource Strain: Not on file  Food Insecurity: Not on file  Transportation Needs: Not on file  Physical Activity: Not on file  Stress: Not on file  Social Connections: Not on file  Intimate Partner Violence: Not on file   Family History  Problem Relation Age of Onset   Emphysema Mother    COPD Mother    Early death Mother    Hyperlipidemia Father    Stroke Father    Diabetes Father    High Cholesterol Father    Colon cancer Neg Hx     Objective: Office vital signs reviewed. BP (!) 86/52   Pulse 62   Temp (!) 97.3 F (36.3 C)   Ht 5' 5 (1.651 m)   Wt (!) 314 lb 8 oz (142.7 kg)   SpO2 94%   BMI 52.34 kg/m   Physical Examination:  General: Awake, alert, morbidly obese, No acute distress HEENT: sclera white, MMM Cardio: regular rate and rhythm, S1S2 heard, no murmurs appreciated Pulm: clear to auscultation bilaterally, no wheezes, rhonchi or rales; normal work of breathing on room air   Lab Results  Component Value Date   HGBA1C 6.6 (H) 01/21/2024    Assessment/ Plan: 63 y.o. male   Diabetes mellitus treated with injections of non-insulin medication (HCC) - Plan: Bayer DCA Hb A1c Waived, Microalbumin / creatinine urine ratio, COVID-19 mRNA vaccine, Pfizer, (COMIRNATY) syringe, Semaglutide ,0.25 or 0.5MG /DOS, (OZEMPIC , 0.25 OR 0.5 MG/DOSE,) 2 MG/3ML SOPN, AMB Referral VBCI Care Management  Hyperlipidemia associated with type 2 diabetes mellitus (HCC) - Plan: COVID-19 mRNA vaccine, Pfizer, (COMIRNATY) syringe, AMB Referral VBCI Care Management  Hypertension associated with diabetes (HCC) - Plan: COVID-19 mRNA vaccine, Pfizer, (COMIRNATY) syringe, AMB Referral VBCI Care Management  Other  cirrhosis of liver (HCC) - Plan: COVID-19 mRNA vaccine, Pfizer, (COMIRNATY) syringe  BMI 50.0-59.9, adult (HCC)  Encounter for immunization - Plan: Flu vaccine trivalent PF, 6mos and older(Flulaval,Afluria,Fluarix,Fluzone)  Chronic constipation  Centrilobular emphysema (HCC) - Plan: AMB Referral VBCI Care Management  Continue semaglutide  through patient assistance program.  I have given him a written prescription for COVID-19.  Urine microalbumin collected today.  A1c demonstrates excellent control of blood sugar at 6.2 so he may discontinue metformin  and continue Ozempic  alone.  I counseled him at length today on diet modification to reduce GI side effects  He will continue statin.  Not due for fasting lipid  Blood pressure is quite soft so I will reach out to his cardiologist to see if they would be okay with us  reducing either his Lasix  or his metoprolol  dosing.  Influenza vaccination administered  He will contact me with information regarding chronic constipation medication  Referral to [facility] I for assistance with Trelegy as this was helpful to him in the past but he cannot afford it.  Breztri  ineffective   Thomas CHRISTELLA Fielding, DO [facility] [phone]

## 2024-06-18 ENCOUNTER — Ambulatory Visit: Payer: Self-pay | Admitting: Family Medicine

## 2024-06-18 LAB — MICROALBUMIN / CREATININE URINE RATIO
Creatinine, Urine: 151.4 mg/dL
Microalb/Creat Ratio: 2 mg/g{creat} (ref 0–29)
Microalbumin, Urine: 3.3 ug/mL

## 2024-06-18 NOTE — Progress Notes (Signed)
 Please advise patient to hold his furosemide  since his blood pressures have been so soft.  He will follow-up with cardiology as directed for ongoing medication management.  They should be contacting him soon for an appointment

## 2024-06-19 ENCOUNTER — Telehealth: Payer: Self-pay | Admitting: Primary Care

## 2024-06-19 ENCOUNTER — Telehealth: Payer: Self-pay

## 2024-06-19 ENCOUNTER — Ambulatory Visit: Payer: Self-pay | Admitting: Primary Care

## 2024-06-19 DIAGNOSIS — R911 Solitary pulmonary nodule: Secondary | ICD-10-CM

## 2024-06-19 DIAGNOSIS — J432 Centrilobular emphysema: Secondary | ICD-10-CM

## 2024-06-19 DIAGNOSIS — G4733 Obstructive sleep apnea (adult) (pediatric): Secondary | ICD-10-CM

## 2024-06-19 NOTE — Telephone Encounter (Signed)
 Patient would like to transfer care to the [facility] location

## 2024-06-19 NOTE — Telephone Encounter (Signed)
 I called and spoke with patient and made him aware that per PCP OV notes, he is too hold off on taking his FUROSEMIDE  Rx until he sees Cardiology. Patient voiced understanding.    Copied from CRM [id number]. Topic: General - Call Back - No Documentation >> Jun 19, 2024  3:25 PM Antwanette L wrote: Reason for CRM: The patient is returning a missed call from [facility]. In the voicemail, the patient was advised to stop taking a specific medication. Please clarify which medication was referenced and follow up with the patient  at [phone]

## 2024-06-19 NOTE — Progress Notes (Signed)
 Patient aware, verbalized understanding.

## 2024-06-19 NOTE — Progress Notes (Signed)
 Care Guide Pharmacy Note  06/19/2024 Name: Thomas Ball MRN: [id number] DOB: 1961/03/08  Referred By: Jolinda Norene HERO, DO Reason for referral: Complex Care Management (Outreach to schedule f/u with Pharm d )   Thomas Ball is a 63 y.o. year old male who is a primary care patient of Jolinda Norene HERO, DO.  Thomas Ball was referred to the pharmacist for assistance related to: DMII  Successful contact was made with the patient to discuss pharmacy services including being ready for the pharmacist to call at least 5 minutes before the scheduled appointment time and to have medication bottles and any blood pressure readings ready for review. The patient agreed to meet with the pharmacist via telephone visit on (date/time).06/25/2024  Jeoffrey Buffalo , RMA     [place]  [facility], [facility] Guide  Direct Dial: [phone]  Website: [email or web address]

## 2024-06-19 NOTE — Telephone Encounter (Signed)
   In process of completing Novo Nordisk refills for patients OZEMPIC  0.5MG  medication.  Application emailed to JULIE P for signature.

## 2024-06-19 NOTE — Telephone Encounter (Signed)
 Patient is requesting TOC to the [place] location----it is closer to home

## 2024-06-20 NOTE — Telephone Encounter (Signed)
Ok with me, thanks.

## 2024-06-25 ENCOUNTER — Telehealth: Payer: Self-pay | Admitting: Pharmacist

## 2024-06-25 ENCOUNTER — Other Ambulatory Visit (INDEPENDENT_AMBULATORY_CARE_PROVIDER_SITE_OTHER): Payer: Self-pay

## 2024-06-25 DIAGNOSIS — Z7985 Long-term (current) use of injectable non-insulin antidiabetic drugs: Secondary | ICD-10-CM

## 2024-06-25 DIAGNOSIS — E119 Type 2 diabetes mellitus without complications: Secondary | ICD-10-CM

## 2024-06-25 NOTE — Progress Notes (Cosign Needed)
 06/25/2024 Name: Thomas Ball MRN: [id number] DOB: [birth year]  Chief Complaint  Patient presents with   Diabetes    Thomas Ball is a 63 y.o. year old male who presented for a telephone visit. I connected with  Thomas Ball on 06/25/24 by telephone and verified that I am speaking with the correct person using two identifiers.  I discussed the limitations of evaluation and management by telemedicine. The patient expressed understanding and agreed to proceed.  Patient was located in her home and PharmD in PCP office during this visit.;   They were referred to the pharmacist by their PCP for assistance in managing diabetes and medication access.    Subjective:  Patient reports he is doing well on Ozempic .  He is receving his supply via Novo Nordisk patient assistance program until the end of 2025.  He would like to renew for 2026, however he will not meet criteria unless he applies for Medicaid and received denial letter.  He is currently Manufacturing systems engineer diskus inhaler for $45 at [facility] with Good [facility] coupon as he remains uninsured.  Care Team: Primary Care Provider: Jolinda Norene HERO, DO ;    Medication Access/Adherence  Current Pharmacy:  [facility] - [place], [place] - [address] [place] [address] Phone: [phone] Fax: [phone]  [facility] - [place], [place] - [address] [place] [address] Phone: [phone] Fax: [phone]   Patient reports affordability concerns with their medications: Yes  Patient reports access/transportation concerns to their pharmacy: No  Patient reports adherence concerns with their medications:  No     Diabetes:  Current medications: Ozempic  0.5mg  weekly Medications tried in the past: metformin  stopped due to control with Ozempic  monotherapy  Current glucose readings: FBG 103-120, PPBG up to 180 but never higher Traditional glucometer  Patient denies hypoglycemic s/sx  including dizziness, shakiness, sweating. Patient denies hyperglycemic symptoms including polyuria, polydipsia, polyphagia, nocturia, neuropathy, blurred vision.  Current meal patterns:  Working on [employer]; reports he has GI side effects when he eats bad  Discussed meal planning options and Plate method for healthy eating Avoid sugary drinks and desserts Incorporate balanced protein, non starchy veggies, 1 serving of carbohydrate with each meal Increase water  intake Increase physical activity as able  Current physical activity: encouraged as able  Current medication access support: novo nordisk PAP (Ozempic )--ends 08/2024  Macrovascular and Microvascular Risk Reduction:  Statin? Patient not currently taking-LDL 39; ACEi/ARB? No, UACR 2; will revisit Last urinary albumin/creatinine ratio:  Lab Results  Component Value Date   MICRALBCREAT 2 06/17/2024   MICRALBCREAT 4 03/13/2023   Last eye exam:  Lab Results  Component Value Date   HMDIABEYEEXA No Retinopathy 08/14/2022   Last foot exam: 03/13/2023 Tobacco Use:  Tobacco Use: High Risk (06/17/2024)   Patient History    Smoking Tobacco Use: Every Day    Smokeless Tobacco Use: Never    Passive Exposure: Not on file     Objective:  Lab Results  Component Value Date   HGBA1C 6.2 (H) 06/17/2024    Lab Results  Component Value Date   CREATININE 0.89 05/08/2024   BUN 18 05/08/2024   NA 137 05/08/2024   K 4.6 05/08/2024   CL 99 05/08/2024   CO2 24 05/08/2024    Lab Results  Component Value Date   CHOL 97 (L) 01/21/2024   HDL 41 01/21/2024   LDLCALC 39  01/21/2024   TRIG 82 01/21/2024   CHOLHDL 2.4 01/21/2024    Medications Reviewed Today     Reviewed by Billee Mliss BIRCH, [facility] (Pharmacist) on 06/25/24 at 0910  Med List Status: <None>   Medication Order Taking? Sig Documenting Provider Last Dose Status Informant  albuterol  (PROVENTIL ) (2.5 MG/3ML) 0.083% nebulizer solution [id number]  Take 3 mLs (2.5  mg total) by nebulization every 6 (six) hours as needed for wheezing or shortness of breath. Parrett, Madelin RAMAN, NP  Active   albuterol  (VENTOLIN  HFA) 108 (90 Base) MCG/ACT inhaler [id number]  Inhale 2 puffs into the lungs every 6 (six) hours as needed for wheezing or shortness of breath. Cathlene Marry Lenis, FNP  Active   atorvastatin  (LIPITOR) 40 MG tablet [id number]  Take 1 tablet (40 mg total) by mouth daily.  Patient not taking: Reported on 06/17/2024   Jolinda Potter M, DO  Active   doxazosin  (CARDURA ) 2 MG tablet [id number]  Take 0.5 tablets (1 mg total) by mouth daily. Jolinda Potter M, DO  Active   flecainide  (TAMBOCOR ) 50 MG tablet [id number]  Take 1 tablet (50 mg total) by mouth 2 (two) times daily. Okey Vina GAILS, MD  Active   Fluticasone -Umeclidin-Vilant (TRELEGY ELLIPTA) 100-62.5-25 MCG/ACT AEPB [id number]  Inhale into the lungs. [provider]  Active   furosemide  (LASIX ) 20 MG tablet [id number]  TAKE ONE (1) TABLET BY MOUTH EVERY DAY Gottschalk, Ashly M, DO  Active   metoprolol  succinate (TOPROL  XL) 25 MG 24 hr tablet [id number]  Take 1 tablet (25 mg total) by mouth in the morning and at bedtime. Jolinda Potter M, DO  Active   omeprazole  (PRILOSEC) 40 MG capsule [id number]  TAKE ONE (1) CAPSULE EACH DAY [place], [place] M, [place]  Active   Semaglutide ,0.25 or 0.5MG /DOS, (OZEMPIC , 0.25 OR 0.5 MG/DOSE,) 2 MG/3ML SOPN [id number]  Inject 0.5 mg into the skin once a week. Jolinda Potter M, DO  Active   sildenafil  (VIAGRA ) 100 MG tablet [id number]  Take 0.5-1 tablets (50-100 mg total) by mouth daily as needed for erectile dysfunction. Jolinda Potter M, DO  Active   tamsulosin  (FLOMAX ) 0.4 MG CAPS capsule [id number]  Take 1 capsule (0.4 mg total) by mouth in the morning and at bedtime. McKenzie, Belvie CROME, MD  Active   Med List Note Christie, Alyson, CPhT 05/04/21 1200): Cpap            Assessment/Plan:   Diabetes: - Currently controlled; goal A1c <7%. Cardiorenal risk  reduction is opportunities for improvement.. Blood pressure is at goal <130/80. LDL is at goal.  - Reviewed long term cardiovascular and renal outcomes of uncontrolled blood sugar. and Reviewed goal A1c, goal fasting, and goal 2 hour post prandial glucose. Recommended to check glucose fasting daily  or if symptomatic - Recommend to : Continue Ozempic  to 0.5mg  weekly  Metformin  stopped  - Patient denies personal or family history of multiple endocrine neoplasia type 2, medullary thyroid  cancer; personal history of pancreatitis or gallbladder disease. - Recommend to check glucose daily (fasting) or if symptomatic - Meets financial criteria for ozempic  patient assistance program through novo nordisk. Patient approved,however discussed that he will have to likely resume metformin  in 2026 or apply for Medicaid and be denied.   COPD: Patient using wixela due to cost  Reports he is better controlled on Trelegy Will leave samples when shipment comes in Denied for [place] PAP (breztri ) Can't appeal due to patient unable to apply for medicaid again (previously denied, but can't find  letter)--message sent to CPhT to assist     Mliss Tarry Griffin, PharmD, BCACP, CPP Clinical Pharmacist, [facility]

## 2024-06-25 NOTE — Telephone Encounter (Signed)
   Patient is uninsured and enrolled in the Novo Nordisk patient assistance program for Ozempic .  We will explore the new 2026 re-enrollment terms & conditions.  Patient would like to re-enroll in the PAP.  Patient may ned to provide additional income information of Medicaid denial letter.  Patient currently has enough therapy to last until December.  Will route to CPhT for assistance.   Thomas Ball Thomas Ball, PharmD, BCACP, CPP Clinical Pharmacist, [facility]

## 2024-06-26 ENCOUNTER — Telehealth: Payer: Self-pay | Admitting: Pharmacist

## 2024-06-26 NOTE — Telephone Encounter (Signed)
 Assisted in completion of novo nordisk patient assistance application (ozempic ) for patient.  Patient and provider signatures captured, reviewed and faxed back to CPhT.  Medication list reviewed and FYI tab updated.   Arlen Legendre Dattero Sharee Sturdy, PharmD, BCACP, CPP Clinical Pharmacist, [facility]

## 2024-07-01 ENCOUNTER — Other Ambulatory Visit: Payer: Self-pay | Admitting: Urology

## 2024-07-01 ENCOUNTER — Ambulatory Visit (INDEPENDENT_AMBULATORY_CARE_PROVIDER_SITE_OTHER): Payer: Self-pay

## 2024-07-01 DIAGNOSIS — R339 Retention of urine, unspecified: Secondary | ICD-10-CM

## 2024-07-01 DIAGNOSIS — N401 Enlarged prostate with lower urinary tract symptoms: Secondary | ICD-10-CM

## 2024-07-01 LAB — BLADDER SCAN AMB NON-IMAGING: Scan Result: 512

## 2024-07-01 MED ORDER — FINASTERIDE 5 MG PO TABS: 5.0000 mg | ORAL_TABLET | Freq: Every day | ORAL | 11 refills | Status: AC

## 2024-07-01 NOTE — Progress Notes (Addendum)
 Bladder Scan completed today.  Patient can void prior to the bladder scan. Bladder scan result: 512  Performed By: Carlos, CMA  Additional notes-  Dr. Matilda prescribe pt Finasteride 5 mg and want pt to return in two weeks for f/u with Dr. Sherrilee

## 2024-07-03 ENCOUNTER — Telehealth: Payer: Self-pay | Admitting: Family Medicine

## 2024-07-03 NOTE — Telephone Encounter (Signed)
 I called pt & I told him that he has samples of Madalyn

## 2024-07-04 ENCOUNTER — Telehealth: Payer: Self-pay | Admitting: *Deleted

## 2024-07-09 NOTE — Telephone Encounter (Signed)
 Faxed completed refill form to novo nordisk.   Ozempic  0.5mg  dose pens.

## 2024-07-14 NOTE — Telephone Encounter (Signed)
 Pt called to reschedule his procedure. He is asking to be rescheduled in Dec.  Advised pt that will call him once we get providers Dec schedule to get him rescheduled.

## 2024-07-16 ENCOUNTER — Encounter (HOSPITAL_COMMUNITY): Payer: Self-pay

## 2024-07-17 ENCOUNTER — Other Ambulatory Visit: Payer: Self-pay | Admitting: Family Medicine

## 2024-07-17 DIAGNOSIS — E119 Type 2 diabetes mellitus without complications: Secondary | ICD-10-CM

## 2024-07-17 NOTE — Telephone Encounter (Signed)
 Copied from CRM [id number]. Topic: Clinical - Medication Refill >> Jul 17, 2024  2:34 PM Nathanel BROCKS wrote: Medication: Semaglutide ,0.25 or 0.5MG /DOS, (OZEMPIC , 0.25 OR 0.5 MG/DOSE,) 2 MG/3ML  Has the patient contacted their pharmacy? No   Pt comes to the office to pick up.  This is the patient's preferred pharmacy:    Is this the correct pharmacy for this prescription? Yes If no, delete pharmacy and type the correct one.   Has the prescription been filled recently? Yes  Is the patient out of the medication? No  Has the patient been seen for an appointment in the last year OR does the patient have an upcoming appointment? Yes  Can we respond through MyChart? No  Agent: Please be advised that Rx refills may take up to 3 business days. We ask that you follow-up with your pharmacy.

## 2024-07-18 NOTE — Telephone Encounter (Signed)
 LMOVM to return call  TCS +/-EGD w/Dr.Carver, asa 3

## 2024-07-21 ENCOUNTER — Other Ambulatory Visit: Payer: Self-pay | Admitting: *Deleted

## 2024-07-21 ENCOUNTER — Ambulatory Visit (HOSPITAL_COMMUNITY): Admission: RE | Admit: 2024-07-21 | Payer: Self-pay | Source: Home / Self Care | Admitting: Internal Medicine

## 2024-07-21 ENCOUNTER — Other Ambulatory Visit: Payer: Self-pay

## 2024-07-21 ENCOUNTER — Encounter (HOSPITAL_COMMUNITY): Admission: RE | Payer: Self-pay | Source: Home / Self Care

## 2024-07-21 ENCOUNTER — Encounter: Payer: Self-pay | Admitting: *Deleted

## 2024-07-21 ENCOUNTER — Telehealth: Payer: Self-pay

## 2024-07-21 SURGERY — COLONOSCOPY
Anesthesia: Choice

## 2024-07-21 MED ORDER — PEG 3350-KCL-NA BICARB-NACL 420 G PO SOLR: 4000.0000 mL | Freq: Once | ORAL | 0 refills | Status: AC

## 2024-07-21 NOTE — Telephone Encounter (Signed)
 4 boxes of Ozempic  received on 10/27 and placed in lab refrigerator. Pt notified via voicemail. LS

## 2024-07-21 NOTE — Telephone Encounter (Signed)
 Pt has been scheduled for 09/22/24. Update instructions mailed and prep sent to pharmacy.   Message sent to Dr.Carver: Pt is concerned that that one jug of trilyte will not clean him out. He said this happened before that he just did trilyte and it didn't do the job, then he ended up having to have 2 preps. please advise. thank you   Per Dr.Carver: Lets do 2 day prep then.

## 2024-07-22 ENCOUNTER — Ambulatory Visit (INDEPENDENT_AMBULATORY_CARE_PROVIDER_SITE_OTHER): Payer: Self-pay | Admitting: Family Medicine

## 2024-07-22 ENCOUNTER — Encounter: Payer: Self-pay | Admitting: *Deleted

## 2024-07-22 ENCOUNTER — Telehealth: Payer: Self-pay | Admitting: Internal Medicine

## 2024-07-22 ENCOUNTER — Encounter: Payer: Self-pay | Admitting: Family Medicine

## 2024-07-22 VITALS — BP 107/61 | HR 59 | Temp 97.7°F | Ht 65.0 in | Wt 310.5 lb

## 2024-07-22 DIAGNOSIS — R0602 Shortness of breath: Secondary | ICD-10-CM

## 2024-07-22 DIAGNOSIS — Z23 Encounter for immunization: Secondary | ICD-10-CM

## 2024-07-22 DIAGNOSIS — K5909 Other constipation: Secondary | ICD-10-CM

## 2024-07-22 DIAGNOSIS — E119 Type 2 diabetes mellitus without complications: Secondary | ICD-10-CM

## 2024-07-22 DIAGNOSIS — Z7985 Long-term (current) use of injectable non-insulin antidiabetic drugs: Secondary | ICD-10-CM

## 2024-07-22 MED ORDER — LINACLOTIDE 290 MCG PO CAPS: 290.0000 ug | ORAL_CAPSULE | Freq: Every day | ORAL | 4 refills | Status: DC

## 2024-07-22 NOTE — Telephone Encounter (Signed)
 PA is mildly dilated suggesting pulmonary HTN   Not definitive.  May be related to COPD

## 2024-07-22 NOTE — Telephone Encounter (Signed)
 05/30/24 CT  IMPRESSION: 1. Lung-RADS 2, benign appearance or behavior. Continue annual screening with low-dose chest CT without contrast in 12 months. 2. Stable dilated main pulmonary artery, suggesting pulmonary arterial hypertension. 3. Aortic Atherosclerosis (ICD10-I70.0) and Emphysema (ICD10-J43.9).      I will forward to Dr.Ross

## 2024-07-22 NOTE — Telephone Encounter (Signed)
 Per DPR, MD message left on cell phone, also sent to MyChart

## 2024-07-22 NOTE — Telephone Encounter (Signed)
 Schedule for echo to evaluate LV/RV    Dx  SOB

## 2024-07-22 NOTE — Progress Notes (Signed)
 Subjective: RR:[id number] PCP: Jolinda Norene HERO, DO YEP:Tjouzm W Hafley is a 63 y.o. male presenting to clinic today for:  Reports Linzess to 90 mcg was very helpful for moving his bowels.  Denies any diarrheal symptoms with it.  Use it more as needed then daily.  No blood in stool.  No nausea or vomiting or abdominal cramping  ROS: Per HPI  No Known Allergies Past Medical History:  Diagnosis Date   GERD (gastroesophageal reflux disease)    HCV (hepatitis C virus) DR. ZACKS   AUG 2013 VIRAL LOAD UNDETECTABLE   History of cardiac catheterization    a. 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   Sleep apnea    SVT (supraventricular tachycardia)     Current Outpatient Medications:    albuterol  (PROVENTIL ) (2.5 MG/3ML) 0.083% nebulizer solution, Take 3 mLs (2.5 mg total) by nebulization every 6 (six) hours as needed for wheezing or shortness of breath., Disp: 150 mL, Rfl: 5   albuterol  (VENTOLIN  HFA) 108 (90 Base) MCG/ACT inhaler, Inhale 2 puffs into the lungs every 6 (six) hours as needed for wheezing or shortness of breath., Disp: 8 g, Rfl: 2   atorvastatin  (LIPITOR) 40 MG tablet, Take 1 tablet (40 mg total) by mouth daily. (Patient not taking: Reported on 06/17/2024), Disp: 90 tablet, Rfl: 3   doxazosin  (CARDURA ) 2 MG tablet, Take 0.5 tablets (1 mg total) by mouth daily., Disp: 45 tablet, Rfl: 3   finasteride (PROSCAR) 5 MG tablet, Take 1 tablet (5 mg total) by mouth daily., Disp: 30 tablet, Rfl: 11   flecainide  (TAMBOCOR ) 50 MG tablet, Take 1 tablet (50 mg total) by mouth 2 (two) times daily., Disp: , Rfl:    Fluticasone -Umeclidin-Vilant (TRELEGY ELLIPTA) 100-62.5-25 MCG/ACT AEPB, Inhale into the lungs., Disp: , Rfl:    furosemide  (LASIX ) 20 MG tablet, TAKE ONE (1) TABLET BY MOUTH EVERY DAY, Disp: 30 tablet, Rfl: 3   metoprolol  succinate (TOPROL  XL)  25 MG 24 hr tablet, Take 1 tablet (25 mg total) by mouth in the morning and at bedtime., Disp: 180 tablet, Rfl: 3   omeprazole  (PRILOSEC) 40 MG capsule, TAKE ONE (1) CAPSULE EACH DAY, Disp: 90 capsule, Rfl: 3   Semaglutide ,0.25 or 0.5MG /DOS, (OZEMPIC , 0.25 OR 0.5 MG/DOSE,) 2 MG/3ML SOPN, Inject 0.5 mg into the skin once a week., Disp: , Rfl:    sildenafil  (VIAGRA ) 100 MG tablet, Take 0.5-1 tablets (50-100 mg total) by mouth daily as needed for erectile dysfunction., Disp: 5 tablet, Rfl: 11   tamsulosin  (FLOMAX ) 0.4 MG CAPS capsule, Take 1 capsule (0.4 mg total) by mouth in the morning and at bedtime., Disp: 60 capsule, Rfl: 11 Social History   Socioeconomic History   Marital status: Widowed    Spouse name: Not on file   Number of children: Not on file   Years of education: Not on file   Highest education level: Not on file  Occupational History   Occupation: concrete work    Associate Professor: SELF-EMPLOYED  Tobacco Use   Smoking status: Every Day    Current packs/day: 2.00    Average packs/day: 2.0 packs/day for 45.0 years (90.0 ttl pk-yrs)    Types: Cigarettes   Smokeless tobacco: Never   Tobacco comments:    smokes 1-1.5 packs per day  Vaping Use   Vaping status: Never Used  Substance and Sexual Activity   Alcohol use: No   Drug use: No    Comment: in the 1980s, smoke crack. None since Dec 2011.    Sexual activity: Not Currently  Other Topics Concern   Not on file  Social History Narrative   Not on file   Social Drivers of Health   Financial Resource Strain: High Risk (06/25/2024)   Overall Financial Resource Strain (CARDIA)    Difficulty of Paying Living Expenses: Hard  Food Insecurity: Not on file  Transportation Needs: Not on file  Physical Activity: Not on file  Stress: Not on file  Social Connections: Not on file  Intimate Partner Violence: Not on file   Family History  Problem Relation Age of Onset   Emphysema Mother    COPD Mother    Early death Mother     Hyperlipidemia Father    Stroke Father    Diabetes Father    High Cholesterol Father    Colon cancer Neg Hx     Objective: Office vital signs reviewed. BP 107/61   Pulse (!) 59   Temp 97.7 F (36.5 C)   Ht 5' 5 (1.651 m)   Wt (!) 310 lb 8 oz (140.8 kg)   SpO2 95%   BMI 51.67 kg/m   Physical Examination:  General: Awake, alert, morbidly obese, No acute distress HEENT: Sclera white.  Moist mucous membranes Cardio: Slightly bradycardic rate with regular rhythm, S1S2 heard, no murmurs appreciated Pulm: clear to auscultation bilaterally, no wheezes, rhonchi or rales; normal work of breathing on room air GI: Morbidly obese with very protuberant abdomen  Diabetic Foot Exam - Simple   Simple Foot Form Diabetic Foot exam was performed with the following findings: Yes 07/22/2024  1:15 PM  Visual Inspection No deformities, no ulcerations, no other skin breakdown bilaterally: Yes See comments: Yes Sensation Testing Intact to touch and monofilament testing bilaterally: Yes Pulse Check Posterior Tibialis and Dorsalis pulse intact bilaterally: Yes Comments Onychomycosis of great toenails b/l     Lab Results  Component Value Date   HGBA1C 6.2 (H) 06/17/2024    Assessment/ Plan: 63 y.o. male   Chronic constipation - Plan: linaclotide (LINZESS) 290 MCG CAPS capsule  Diabetes mellitus treated with injections of non-insulin medication (HCC)  Immunization due - Plan: Pneumococcal conjugate vaccine 20-valent (Prevnar 20)  Linzess samples given and Rx sent to pharmacy as patient has had refractory constipation to OTC management including MiraLAX.  He is on Ozempic  which sadly exacerbates issues but is cardioprotective and needed for his type 2 diabetes and hopeful weight loss  Diabetic foot exam performed.  Not due for labs  Pneumococcal vaccination administered   Norene CHRISTELLA Fielding, DO [facility] [phone]

## 2024-07-22 NOTE — Telephone Encounter (Signed)
 Pt calling to make office aware he had a lung cancer screening and was advised he had pulmonary hypertension. Please advise.

## 2024-07-23 ENCOUNTER — Other Ambulatory Visit: Payer: Self-pay

## 2024-07-23 NOTE — Telephone Encounter (Signed)
 Called and spoke to pt. Results and rec's given per Dr. Okey. He agrees to Echo; order placed. Would like to have it done at [facility]. 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 No other concerns for now.

## 2024-07-23 NOTE — Telephone Encounter (Signed)
 Patient is returning call. Please advise?

## 2024-07-23 NOTE — Addendum Note (Signed)
 Addended by: Jennika Ringgold C on: 07/23/2024 03:26 PM   Modules accepted: Orders

## 2024-07-24 NOTE — Progress Notes (Signed)
 Established Patient Pulmonology Office Visit   Subjective:  Patient ID: Thomas Ball, male    DOB: [birth year]  MRN: [id number]  CC: No chief complaint on file.   HPI  Thomas Ball is a 63 y/o  M with hx of  SVT, hypertension, cirrhosis, diabetes, hepatitis C, gallstone pancreatitis, Atrial Fib, OSA, and COPD who presents for follow up.  Last seen on   {PULM QUESTIONNAIRES (Optional):33196}  ROS  {History (Optional):23778}  Current Outpatient Medications:    albuterol  (PROVENTIL ) (2.5 MG/3ML) 0.083% nebulizer solution, Take 3 mLs (2.5 mg total) by nebulization every 6 (six) hours as needed for wheezing or shortness of breath., Disp: 150 mL, Rfl: 5   albuterol  (VENTOLIN  HFA) 108 (90 Base) MCG/ACT inhaler, Inhale 2 puffs into the lungs every 6 (six) hours as needed for wheezing or shortness of breath., Disp: 8 g, Rfl: 2   atorvastatin  (LIPITOR) 40 MG tablet, Take 1 tablet (40 mg total) by mouth daily., Disp: 90 tablet, Rfl: 3   doxazosin  (CARDURA ) 2 MG tablet, Take 0.5 tablets (1 mg total) by mouth daily., Disp: 45 tablet, Rfl: 3   finasteride (PROSCAR) 5 MG tablet, Take 1 tablet (5 mg total) by mouth daily., Disp: 30 tablet, Rfl: 11   flecainide  (TAMBOCOR ) 50 MG tablet, Take 1 tablet (50 mg total) by mouth 2 (two) times daily., Disp: , Rfl:    Fluticasone -Umeclidin-Vilant (TRELEGY ELLIPTA) 100-62.5-25 MCG/ACT AEPB, Inhale into the lungs., Disp: , Rfl:    furosemide  (LASIX ) 20 MG tablet, TAKE ONE (1) TABLET BY MOUTH EVERY DAY, Disp: 30 tablet, Rfl: 3   linaclotide (LINZESS) 290 MCG CAPS capsule, Take 1 capsule (290 mcg total) by mouth daily before breakfast. For chronic constipation, Disp: 90 capsule, Rfl: 4   metoprolol  succinate (TOPROL  XL) 25 MG 24 hr tablet, Take 1 tablet (25 mg total) by mouth in the morning and at bedtime., Disp: 180 tablet, Rfl: 3   omeprazole  (PRILOSEC) 40 MG capsule, TAKE ONE (1) CAPSULE EACH DAY, Disp: 90 capsule, Rfl: 3   Semaglutide ,0.25 or 0.5MG /DOS,  (OZEMPIC , 0.25 OR 0.5 MG/DOSE,) 2 MG/3ML SOPN, Inject 0.5 mg into the skin once a week., Disp: , Rfl:    sildenafil  (VIAGRA ) 100 MG tablet, Take 0.5-1 tablets (50-100 mg total) by mouth daily as needed for erectile dysfunction., Disp: 5 tablet, Rfl: 11   tamsulosin  (FLOMAX ) 0.4 MG CAPS capsule, Take 1 capsule (0.4 mg total) by mouth in the morning and at bedtime., Disp: 60 capsule, Rfl: 11      Objective:  There were no vitals taken for this visit. {Pulm Vitals (Optional):32837}  Physical Exam   Diagnostic Review:  {Labs (Optional):32838}  CT chest May 30, 2024 emphysema, stable at 8.8 mm noncalcified right lower lobe nodule, calcified granuloma in LUL (lung RADS 2 recommend CT chest in 1 year) Lungs/Pleura: No pneumothorax. No pleural effusion. Mild centrilobular emphysema with diffuse bronchial wall thickening. No acute consolidative airspace disease or lung masses. Stable scattered pulmonary nodules up to 8.7 mm in the posteromedial right lower lobe on image 187. No new significant pulmonary nodules. IMPRESSION: 1. Lung-RADS 2, benign appearance or behavior. Continue annual screening with low-dose chest CT without contrast in 12 months. 2. Stable dilated main pulmonary artery, suggesting pulmonary arterial hypertension. 3. Aortic Atherosclerosis (ICD10-I70.0) and Emphysema (ICD10-J43.9).   2D echo March 30, 2022 EF 55 to 60%, RV SF is normal, RV size is normal right and left atrium mild to moderate dilated   PFT 12/02/18 >> FEV1 2.78 (  92%), FEV1% 79, TLC 4.83 (83%), DLCO 97% PFT 11/15/21 >> FEV1 2.45 (84%), FEV1% 77, TLC 5.66 (97%), DLCO 99%   PSG 08/01/17 >> AHI 40.4, SpO2 low 74%    RHC/LHC 10/08/18 >> no CAD, mild pulmonary HTN     Assessment & Plan:   Assessment & Plan   No orders of the defined types were placed in this encounter.     No follow-ups on file.   Umer Harig, MD

## 2024-07-25 ENCOUNTER — Encounter: Payer: Self-pay | Admitting: Pulmonary Disease

## 2024-07-25 ENCOUNTER — Ambulatory Visit (INDEPENDENT_AMBULATORY_CARE_PROVIDER_SITE_OTHER): Payer: Self-pay | Admitting: Pulmonary Disease

## 2024-07-25 VITALS — BP 107/69 | HR 63 | Ht 65.0 in | Wt 310.8 lb

## 2024-07-25 DIAGNOSIS — J449 Chronic obstructive pulmonary disease, unspecified: Secondary | ICD-10-CM

## 2024-07-25 DIAGNOSIS — Z6841 Body Mass Index (BMI) 40.0 and over, adult: Secondary | ICD-10-CM

## 2024-07-25 DIAGNOSIS — G4733 Obstructive sleep apnea (adult) (pediatric): Secondary | ICD-10-CM

## 2024-07-25 DIAGNOSIS — R911 Solitary pulmonary nodule: Secondary | ICD-10-CM

## 2024-07-25 DIAGNOSIS — J209 Acute bronchitis, unspecified: Secondary | ICD-10-CM

## 2024-07-25 DIAGNOSIS — I272 Pulmonary hypertension, unspecified: Secondary | ICD-10-CM

## 2024-07-25 DIAGNOSIS — F17210 Nicotine dependence, cigarettes, uncomplicated: Secondary | ICD-10-CM

## 2024-07-25 MED ORDER — NICOTINE 21 MG/24HR TD PT24: MEDICATED_PATCH | TRANSDERMAL | 0 refills | Status: AC

## 2024-07-25 MED ORDER — NICOTINE 7 MG/24HR TD PT24: MEDICATED_PATCH | TRANSDERMAL | 0 refills | Status: AC

## 2024-07-25 MED ORDER — UMECLIDINIUM-VILANTEROL 62.5-25 MCG/ACT IN AEPB: 1.0000 | INHALATION_SPRAY | Freq: Every day | RESPIRATORY_TRACT | 6 refills | Status: AC

## 2024-07-25 MED ORDER — NICOTINE 14 MG/24HR TD PT24: MEDICATED_PATCH | TRANSDERMAL | 0 refills | Status: AC

## 2024-07-25 NOTE — Assessment & Plan Note (Signed)
 Discussed with the patient about the importance of maintaining a healthy weight and the positive impact it can have on lung function. Encouraged them to reduce caloric intake and increase activity. - Continue Ozempic  for weight management. - Encourage ongoing physical activity and dietary modifications. - Refer to pulmonary rehabilitation for structured exercise program.

## 2024-07-25 NOTE — Assessment & Plan Note (Signed)
 The patient has OSA and currently on CPAP. He uses the CPAP every night but not a full 4 hours every night. He notes improvement in symptoms including daytime hypersomnolence, fatigue, snoring etc... with CPAP use. I encouraged him to increase his CPAP use to greater than 4 hours every night. I also discussed weight loss. - Encourage consistent CPAP use for at least 4 hours per night. - Discuss the importance of weight loss.

## 2024-07-25 NOTE — Patient Instructions (Signed)
  VISIT SUMMARY: Today, we discussed your respiratory symptoms, sleep apnea, and other health concerns. We made adjustments to your COPD treatment, encouraged consistent CPAP use, and discussed weight loss and smoking cessation strategies.  YOUR PLAN: CHRONIC OBSTRUCTIVE PULMONARY DISEASE (COPD): Your COPD causes occasional shortness of breath and cough. You mentioned cost concerns with your current medication. -We will prescribe Anoro as a more affordable alternative to Trelegy. -Continue using albuterol  as needed for sudden shortness of breath. -You are referred to a pulmonary rehabilitation program to help manage your symptoms.  OBSTRUCTIVE SLEEP APNEA: Your sleep apnea is causing you to feel tired during the day. You have difficulty with mask comfort and dry mouth. -Use your CPAP machine consistently for at least 4 hours each night. -Weight loss can help improve your sleep apnea symptoms, so continue working on losing weight.  PULMONARY HYPERTENSION: You may have pulmonary hypertension, possibly related to your sleep apnea or COPD. We are waiting for your echocardiogram results. -We will follow up on the scheduling and results of your echocardiogram. -Weight loss may help with this condition, so continue your efforts to lose weight.  MORBID OBESITY: You have lost weight but still have more to lose. You are using Ozempic  and staying active. -Continue taking Ozempic  to help manage your weight. -Keep up with your physical activities and make healthy dietary choices. -You are referred to a pulmonary rehabilitation program for a structured exercise plan.  TYPE 2 DIABETES MELLITUS: Your diabetes is well-managed with Ozempic , which also helps control your appetite. -Continue taking Ozempic  for diabetes management and weight loss.  NICOTINE DEPENDENCE, CIGARETTES: You are currently smoking about 10 cigarettes a day and using nicotine lozenges. You are interested in trying nicotine patches. -Use  nicotine patches in addition to lozenges to help reduce smoking. -Keep working on reducing the number of cigarettes you smoke each day.  STABLE SOLITARY PULMONARY NODULE: You have a stable nodule in your right lung that has not changed for at least three years. -No immediate action is needed for this nodule as it remains stable.  Contains text generated by [employer].

## 2024-07-28 ENCOUNTER — Telehealth: Payer: Self-pay | Admitting: *Deleted

## 2024-07-28 ENCOUNTER — Encounter: Payer: Self-pay | Admitting: *Deleted

## 2024-07-28 ENCOUNTER — Encounter: Payer: Self-pay | Admitting: Radiology

## 2024-07-28 NOTE — Patient Instructions (Signed)
 Thomas Ball - At your request, I have rescheduled your appointment to 08-12-2024 at 1:00 pm. I work with Jolinda Norene HERO, DO and am calling to support your healthcare needs. Please contact me at [phone] at your earliest convenience. I look forward to speaking with you soon.   Thank you,  Rosina Forte, BSN RN [facility], [facility] RN Care Manager Direct Dial: [phone]  Fax: [phone]

## 2024-07-29 ENCOUNTER — Ambulatory Visit: Payer: Self-pay | Admitting: Primary Care

## 2024-08-12 ENCOUNTER — Other Ambulatory Visit: Payer: Self-pay | Admitting: *Deleted

## 2024-08-12 ENCOUNTER — Encounter: Payer: Self-pay | Admitting: *Deleted

## 2024-08-12 VITALS — BP 121/73 | HR 22 | Wt 305.0 lb

## 2024-08-12 DIAGNOSIS — Z720 Tobacco use: Secondary | ICD-10-CM

## 2024-08-12 NOTE — Patient Outreach (Signed)
 Complex Care Management   Visit Note  08/12/2024  Name:  LOGEN HEINTZELMAN MRN: [id number] DOB: December 27, 1960  Situation: Referral received for Complex Care Management related to med adherence I obtained verbal consent from Patient.  Visit completed with Patient  on the phone  Background:   Past Medical History:  Diagnosis Date   GERD (gastroesophageal reflux disease)    HCV (hepatitis C virus) DR. ZACKS   AUG 2013 VIRAL LOAD UNDETECTABLE   History of cardiac catheterization    a. 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   Sleep apnea    SVT (supraventricular tachycardia)     Assessment: Patient Reported Symptoms:  Cognitive Cognitive Status: No symptoms reported Cognitive/Intellectual Conditions Management [RPT]: None reported or documented in medical history or problem list   Health Maintenance Behaviors: Annual physical exam, None Healing Pattern: Average Health Facilitated by: Rest  Neurological Neurological Review of Symptoms: No symptoms reported Neurological Management Strategies: Routine screening Neurological Self-Management Outcome: 4 (good)  HEENT HEENT Symptoms Reported: Tinnitus HEENT Management Strategies: Routine screening HEENT Self-Management Outcome: 3 (uncertain)    Cardiovascular Cardiovascular Symptoms Reported: Swelling in legs or feet, Palpitations Does patient have uncontrolled Hypertension?: No Cardiovascular Management Strategies: Coping strategies, Adequate rest, Fluid modification Weight: (!) 305 lb (138.3 kg) Cardiovascular Self-Management Outcome: 4 (good)  Respiratory Respiratory Symptoms Reported: No symptoms reported Respiratory Management Strategies: Adequate rest, CPAP, Routine screening Respiratory Self-Management Outcome: 4 (good)  Endocrine      Gastrointestinal Gastrointestinal Symptoms Reported:  Constipation Gastrointestinal Management Strategies: Adequate rest, Medication therapy Gastrointestinal Self-Management Outcome: 4 (good)    Genitourinary Genitourinary Symptoms Reported: No symptoms reported Genitourinary Management Strategies: Medication therapy Genitourinary Self-Management Outcome: 4 (good)  Integumentary Integumentary Symptoms Reported: No symptoms reported Skin Management Strategies: Routine screening Skin Self-Management Outcome: 4 (good)  Musculoskeletal Musculoskelatal Symptoms Reviewed: No symptoms reported Musculoskeletal Management Strategies: Routine screening Falls in the past year?: No Number of falls in past year: 1 or less Was there an injury with Fall?: No Fall Risk Category Calculator: 0 Patient Fall Risk Level: Low Fall Risk    Psychosocial Psychosocial Symptoms Reported: No symptoms reported Behavioral Management Strategies: Adequate rest Behavioral Health Self-Management Outcome: 4 (good) Behavioral Health Comment: doesnt enjoy activities like he used to Techniques to [employer] with Loss/Stress/Change: Not applicable Quality of Family Relationships: unable to assess Do you feel physically threatened by others?: No    08/12/2024    PHQ2-9 Depression Screening   Little interest or pleasure in doing things Not at all  Feeling down, depressed, or hopeless Not at all  PHQ-2 - Total Score 0  Trouble falling or staying asleep, or sleeping too much Not at all  Feeling tired or having little energy Not at all  Poor appetite or overeating  Not at all  Feeling bad about yourself - or that you are a failure or have let yourself or your family down Not at all  Trouble concentrating on things, such as reading the newspaper or watching television Not at all  Moving or speaking so slowly that other people could have noticed.  Or the opposite - being so fidgety or restless that you have been moving around a lot more than usual Not at all  Thoughts that you would  be better off dead, or hurting yourself in some way Not at all  PHQ2-9 Total Score 0  If you checked off any problems, how difficult have these problems made it for you to do your work, take care of things at home, or get along with other people Not difficult at all  Depression Interventions/Treatment      Today's Vitals   08/12/24 1336  BP: 121/73  Pulse: (!) 22  Weight: (!) 305 lb (138.3 kg)   Pain Scale: 0-10 Pain Score: 0-No pain Multiple Pain Sites: No  Medications Reviewed Today     Reviewed by Nivia, Estee Yohe , RN (Registered Nurse) on 08/12/24 at 1324  Med List Status: <None>   Medication Order Taking? Sig Documenting Provider Last Dose Status Informant  albuterol  (PROVENTIL ) (2.5 MG/3ML) 0.083% nebulizer solution [id number] Yes Take 3 mLs (2.5 mg total) by nebulization every 6 (six) hours as needed for wheezing or shortness of breath. Parrett, Madelin RAMAN, NP  Active   albuterol  (VENTOLIN  HFA) 108 (90 Base) MCG/ACT inhaler [id number]  Inhale 2 puffs into the lungs every 6 (six) hours as needed for wheezing or shortness of breath. Cathlene Marry Lenis, FNP  Active   atorvastatin  (LIPITOR) 40 MG tablet [id number] Yes Take 1 tablet (40 mg total) by mouth daily. Jolinda Potter M, DO  Active   doxazosin  (CARDURA ) 2 MG tablet [id number] Yes Take 0.5 tablets (1 mg total) by mouth daily. Jolinda Potter M, DO  Active   finasteride  (PROSCAR ) 5 MG tablet [id number] Yes Take 1 tablet (5 mg total) by mouth daily. Matilda Senior, MD  Active   flecainide  (TAMBOCOR ) 50 MG tablet [id number] Yes Take 1 tablet (50 mg total) by mouth 2 (two) times daily. Okey Vina GAILS, MD  Active   furosemide  (LASIX ) 20 MG tablet [id number]  TAKE ONE (1) TABLET BY MOUTH EVERY DAY Jolinda Potter M, DO  Active   linaclotide  (LINZESS ) 290 MCG CAPS capsule [id number]  Take 1 capsule (290 mcg total) by mouth daily before breakfast. For chronic constipation Jolinda Potter M, DO  Active   metoprolol  succinate  (TOPROL  XL) 25 MG 24 hr tablet [id number] Yes Take 1 tablet (25 mg total) by mouth in the morning and at bedtime. Jolinda Potter M, DO  Active   nicotine  (NICODERM CQ  - DOSED IN MG/24 HOURS) 14 mg/24hr patch [id number]  RX #2 Weeks 5-6: 14 mg x 1 patch daily. Wear for 24 hours. If you have sleep disturbances, remove at bedtime.  Patient not taking: Reported on 08/12/2024   Alghanim, Fahid, MD  Active   nicotine  (NICODERM CQ  - DOSED IN MG/24 HOURS) 21 mg/24hr patch [id number]  RX #1 Weeks 1-4: 21 mg x 1 patch daily. Wear for 24 hours. If you have sleep disturbances, remove at bedtime.  Patient not taking: Reported on 08/12/2024   Alghanim, Fahid, MD  Active   nicotine  (NICODERM CQ  - DOSED IN MG/24 HR) 7 mg/24hr patch [id number]  RX #3 Weeks 7-8: 7 mg x 1 patch daily. Wear for 24 hours. If you have sleep disturbances, remove at bedtime. Alghanim, Fahid, MD  Active   omeprazole  (PRILOSEC) 40 MG capsule [id number] Yes TAKE ONE (1) CAPSULE EACH DAY [place], Gardi M, DO  Active   Semaglutide ,0.25 or 0.5MG /DOS, (OZEMPIC , 0.25 OR 0.5 MG/DOSE,) 2 MG/3ML SOPN [id number] Yes Inject 0.5 mg into the skin once a week. Jolinda Potter M, DO  Active   sildenafil  (VIAGRA ) 100 MG tablet [id number] Yes Take 0.5-1 tablets (50-100 mg total) by mouth daily as needed for erectile dysfunction. Jolinda Potter HERO, DO  Active  tamsulosin  (FLOMAX ) 0.4 MG CAPS capsule [id number] Yes Take 1 capsule (0.4 mg total) by mouth in the morning and at bedtime. McKenzie, Belvie CROME, MD  Active   umeclidinium-vilanterol [facility] ELLIPTA) 62.5-25 MCG/ACT AEPB [id number]  Inhale 1 puff into the lungs daily.  Patient not taking: Reported on 08/12/2024   Catherine Cools, MD  Active   Med List Note Christie Alexander, CPhT 05/04/21 1200): Cpap            Recommendation:   Continue Current Plan of Care  Follow Up Plan:   Referral to Pharmacist Lief Palmatier RN RN Care Manager [facility] [phone]

## 2024-08-12 NOTE — Patient Instructions (Signed)
 Visit Information  Thank you for taking time to visit with me today. Please don't hesitate to contact me if I can be of assistance to you before our next scheduled appointment.  Your next care management appointment is by telephone on 08/26/24 at 11:00am  Telephone follow-up in 1 month  Please call the care guide team at [phone] if you need to cancel, schedule, or reschedule an appointment.   Please call the Suicide and Crisis Lifeline: 988 call the [place]  National Suicide Prevention Lifeline: 419-764-1648 or TTY: (984) 878-2617 TTY 725-389-0267) to talk to a trained counselor call 1-800-273-TALK (toll free, 24 hour hotline) if you are experiencing a Mental Health or Behavioral Health Crisis or need someone to talk to.  Cace Osorto RN RN Care Manager [facility] [phone]

## 2024-08-15 ENCOUNTER — Telehealth: Payer: Self-pay | Admitting: Family Medicine

## 2024-08-15 DIAGNOSIS — K5909 Other constipation: Secondary | ICD-10-CM

## 2024-08-15 MED ORDER — LINACLOTIDE 290 MCG PO CAPS: 290.0000 ug | ORAL_CAPSULE | Freq: Every day | ORAL | 0 refills | Status: DC

## 2024-08-15 NOTE — Telephone Encounter (Signed)
Samples of Linzess given 

## 2024-08-15 NOTE — Telephone Encounter (Signed)
 Pt would like Linzess  samples. He can not afford RX.

## 2024-08-18 ENCOUNTER — Ambulatory Visit: Payer: Self-pay | Admitting: Urology

## 2024-08-18 DIAGNOSIS — R339 Retention of urine, unspecified: Secondary | ICD-10-CM

## 2024-08-18 DIAGNOSIS — N401 Enlarged prostate with lower urinary tract symptoms: Secondary | ICD-10-CM

## 2024-08-25 ENCOUNTER — Other Ambulatory Visit: Payer: Self-pay | Admitting: Internal Medicine

## 2024-08-26 ENCOUNTER — Encounter: Payer: Self-pay | Admitting: *Deleted

## 2024-08-26 ENCOUNTER — Telehealth: Payer: Self-pay | Admitting: *Deleted

## 2024-08-26 NOTE — Patient Instructions (Signed)
 Thomas Ball - I am sorry I was unable to reach you today for our scheduled appointment. I work with Jolinda Norene HERO, DO and am calling to support your healthcare needs. Please contact me at [phone] at your earliest convenience. I look forward to speaking with you soon.   Thank you,  Rosina Forte, BSN RN [facility], [facility] RN Care Manager Direct Dial: [phone]  Fax: [phone]

## 2024-08-30 ENCOUNTER — Other Ambulatory Visit: Payer: Self-pay

## 2024-08-30 ENCOUNTER — Emergency Department (HOSPITAL_COMMUNITY): Payer: Self-pay

## 2024-08-30 ENCOUNTER — Emergency Department (HOSPITAL_COMMUNITY)
Admission: EM | Admit: 2024-08-30 | Discharge: 2024-08-30 | Disposition: A | Discharge status: Home / Self Care | Payer: Self-pay | Attending: Emergency Medicine | Admitting: Emergency Medicine

## 2024-08-30 DIAGNOSIS — J449 Chronic obstructive pulmonary disease, unspecified: Secondary | ICD-10-CM | POA: Insufficient documentation

## 2024-08-30 DIAGNOSIS — R001 Bradycardia, unspecified: Secondary | ICD-10-CM | POA: Insufficient documentation

## 2024-08-30 DIAGNOSIS — Z79899 Other long term (current) drug therapy: Secondary | ICD-10-CM | POA: Insufficient documentation

## 2024-08-30 DIAGNOSIS — R42 Dizziness and giddiness: Secondary | ICD-10-CM | POA: Insufficient documentation

## 2024-08-30 DIAGNOSIS — R0789 Other chest pain: Secondary | ICD-10-CM | POA: Insufficient documentation

## 2024-08-30 DIAGNOSIS — I10 Essential (primary) hypertension: Secondary | ICD-10-CM | POA: Insufficient documentation

## 2024-08-30 DIAGNOSIS — R0602 Shortness of breath: Secondary | ICD-10-CM | POA: Insufficient documentation

## 2024-08-30 LAB — CBC WITH DIFFERENTIAL/PLATELET
Abs Immature Granulocytes: 0.01 K/uL (ref 0.00–0.07)
Basophils Absolute: 0 K/uL (ref 0.0–0.1)
Basophils Relative: 1 %
Eosinophils Absolute: 0.1 K/uL (ref 0.0–0.5)
Eosinophils Relative: 2 %
HCT: 39.1 % (ref 39.0–52.0)
Hemoglobin: 13.1 g/dL (ref 13.0–17.0)
Immature Granulocytes: 0 %
Lymphocytes Relative: 26 %
Lymphs Abs: 1.4 K/uL (ref 0.7–4.0)
MCH: 32.1 pg (ref 26.0–34.0)
MCHC: 33.5 g/dL (ref 30.0–36.0)
MCV: 95.8 fL (ref 80.0–100.0)
Monocytes Absolute: 0.4 K/uL (ref 0.1–1.0)
Monocytes Relative: 7 %
Neutro Abs: 3.5 K/uL (ref 1.7–7.7)
Neutrophils Relative %: 64 %
Platelets: 124 K/uL — ABNORMAL LOW (ref 150–400)
RBC: 4.08 MIL/uL — ABNORMAL LOW (ref 4.22–5.81)
RDW: 13.2 % (ref 11.5–15.5)
WBC: 5.4 K/uL (ref 4.0–10.5)
nRBC: 0 % (ref 0.0–0.2)

## 2024-08-30 LAB — BLOOD GAS, VENOUS
Acid-Base Excess: 3.8 mmol/L — ABNORMAL HIGH (ref 0.0–2.0)
Bicarbonate: 29.2 mmol/L — ABNORMAL HIGH (ref 20.0–28.0)
Drawn by: 65579
O2 Saturation: 95 %
Patient temperature: 36.8
pCO2, Ven: 46 mmHg (ref 44–60)
pH, Ven: 7.41 (ref 7.25–7.43)
pO2, Ven: 65 mmHg — ABNORMAL HIGH (ref 32–45)

## 2024-08-30 LAB — LACTIC ACID, PLASMA
Lactic Acid, Venous: 0.7 mmol/L (ref 0.5–1.9)
Lactic Acid, Venous: 0.7 mmol/L (ref 0.5–1.9)

## 2024-08-30 LAB — COMPREHENSIVE METABOLIC PANEL WITH GFR
ALT: 18 U/L (ref 0–44)
AST: 19 U/L (ref 15–41)
Albumin: 4.1 g/dL (ref 3.5–5.0)
Alkaline Phosphatase: 31 U/L — ABNORMAL LOW (ref 38–126)
Anion gap: 6 (ref 5–15)
BUN: 19 mg/dL (ref 8–23)
CO2: 28 mmol/L (ref 22–32)
Calcium: 8.8 mg/dL — ABNORMAL LOW (ref 8.9–10.3)
Chloride: 104 mmol/L (ref 98–111)
Creatinine, Ser: 0.81 mg/dL (ref 0.61–1.24)
GFR, Estimated: >60 mL/min (ref >60)
Glucose, Bld: 118 mg/dL — ABNORMAL HIGH (ref 70–99)
Potassium: 4.1 mmol/L (ref 3.5–5.1)
Sodium: 138 mmol/L (ref 135–145)
Total Bilirubin: 0.6 mg/dL (ref 0.0–1.2)
Total Protein: 6.5 g/dL (ref 6.5–8.1)

## 2024-08-30 LAB — URINALYSIS, ROUTINE W REFLEX MICROSCOPIC
Bilirubin Urine: NEGATIVE
Glucose, UA: NEGATIVE mg/dL
Hgb urine dipstick: NEGATIVE
Ketones, ur: NEGATIVE mg/dL
Leukocytes,Ua: NEGATIVE
Nitrite: NEGATIVE
Protein, ur: NEGATIVE mg/dL
Specific Gravity, Urine: 1.025 (ref 1.005–1.030)
pH: 5 (ref 5.0–8.0)

## 2024-08-30 LAB — MAGNESIUM: Magnesium: 2.2 mg/dL (ref 1.7–2.4)

## 2024-08-30 LAB — TROPONIN T, HIGH SENSITIVITY
Troponin T High Sensitivity: <15 ng/L (ref 0–19)
Troponin T High Sensitivity: <15 ng/L (ref 0–19)

## 2024-08-30 MED ORDER — SODIUM CHLORIDE 0.9 % IV BOLUS
1000.0000 mL | Freq: Once | INTRAVENOUS | Status: AC
  Administered 2024-08-30: 1000 mL via INTRAVENOUS

## 2024-08-30 MED ORDER — SODIUM CHLORIDE 0.9 % IV SOLN: INTRAVENOUS | Status: DC

## 2024-08-30 NOTE — ED Provider Notes (Signed)
 [place] EMERGENCY DEPARTMENT AT [facility] Provider Note   CSN: [id number] Arrival date & time: 08/30/24  9168     Patient presents with: Dizziness and Shortness of Breath   Thomas Ball is a 63 y.o. male.    Dizziness Associated symptoms: shortness of breath   Shortness of Breath  This patient is a 63 year old male with a history of COPD, high cholesterol, hypertension and urinary retention, he takes multiple different medications, he does have a history of having likely atrial fibrillation years ago, he underwent a procedure to ablate and subsequently has been mildly bradycardic ever since.  He states that last night after doing some shopping and being out and about he had felt generally weak and noted that his blood pressure was around 90 systolic, held his medications went to bed and woke up this morning still feeling a little bit lightheaded and foggy.  The patient denies nausea vomiting or diarrhea in fact he struggles with chronic constipation.  The patient has no fevers chills or coughing, there was a little bit of chest discomfort intermittently over time but this is not new, he also has a little bit of shortness of breath but this is not new and has a history of COPD.    Prior to Admission medications   Medication Sig Start Date End Date Taking? Authorizing Provider  albuterol  (PROVENTIL ) (2.5 MG/3ML) 0.083% nebulizer solution Take 3 mLs (2.5 mg total) by nebulization every 6 (six) hours as needed for wheezing or shortness of breath. 12/11/23   Parrett, Madelin RAMAN, NP  albuterol  (VENTOLIN  HFA) 108 (90 Base) MCG/ACT inhaler Inhale 2 puffs into the lungs every 6 (six) hours as needed for wheezing or shortness of breath. 08/09/23   Milian, Marry Lenis, FNP  atorvastatin  (LIPITOR) 40 MG tablet Take 1 tablet (40 mg total) by mouth daily. 01/21/24   Jolinda Norene HERO, DO  doxazosin  (CARDURA ) 2 MG tablet Take 0.5 tablets (1 mg total) by mouth daily. 01/21/24    Jolinda Norene HERO, DO  finasteride  (PROSCAR ) 5 MG tablet Take 1 tablet (5 mg total) by mouth daily. 07/01/24   Matilda Senior, MD  flecainide  (TAMBOCOR ) 50 MG tablet TAKE 1&1/2 TABLETS BY MOUTH TWICE DAILY 08/28/24   Okey Vina GAILS, MD  furosemide  (LASIX ) 20 MG tablet TAKE ONE (1) TABLET BY MOUTH EVERY DAY 03/10/24   Jolinda Norene M, DO  linaclotide  (LINZESS ) 290 MCG CAPS capsule Take 1 capsule (290 mcg total) by mouth daily before breakfast. For chronic constipation Lot #[id number] Exp 10/26 08/15/24   Jolinda Norene M, DO  metoprolol  succinate (TOPROL  XL) 25 MG 24 hr tablet Take 1 tablet (25 mg total) by mouth in the morning and at bedtime. 06/17/24   Jolinda Norene HERO, DO  nicotine  (NICODERM CQ  - DOSED IN MG/24 HOURS) 14 mg/24hr patch RX #2 Weeks 5-6: 14 mg x 1 patch daily. Wear for 24 hours. If you have sleep disturbances, remove at bedtime. Patient not taking: Reported on 08/12/2024 07/25/24   Alghanim, Fahid, MD  nicotine  (NICODERM CQ  - DOSED IN MG/24 HOURS) 21 mg/24hr patch RX #1 Weeks 1-4: 21 mg x 1 patch daily. Wear for 24 hours. If you have sleep disturbances, remove at bedtime. Patient not taking: Reported on 08/12/2024 07/25/24   Alghanim, Paula, MD  nicotine  (NICODERM CQ  - DOSED IN MG/24 HR) 7 mg/24hr patch RX #3 Weeks 7-8: 7 mg x 1 patch daily. Wear for 24 hours. If you have sleep disturbances, remove at bedtime. 07/25/24  Alghanim, Fahid, MD  omeprazole  (PRILOSEC) 40 MG capsule TAKE ONE (1) CAPSULE EACH DAY 02/22/24   Jolinda Potter M, DO  Semaglutide ,0.25 or 0.5MG /DOS, (OZEMPIC , 0.25 OR 0.5 MG/DOSE,) 2 MG/3ML SOPN Inject 0.5 mg into the skin once a week. 06/17/24   Jolinda Potter HERO, DO  sildenafil  (VIAGRA ) 100 MG tablet Take 0.5-1 tablets (50-100 mg total) by mouth daily as needed for erectile dysfunction. 06/25/23   Jolinda Potter HERO, DO  tamsulosin  (FLOMAX ) 0.4 MG CAPS capsule Take 1 capsule (0.4 mg total) by mouth in the morning and at bedtime. 06/16/24   McKenzie,  Belvie CROME, MD  umeclidinium-vilanterol (ANORO ELLIPTA ) 62.5-25 MCG/ACT AEPB Inhale 1 puff into the lungs daily. Patient not taking: Reported on 08/12/2024 07/25/24   Catherine Cools, MD    Allergies: Patient has no allergy information on record.    Review of Systems  Respiratory:  Positive for shortness of breath.   Neurological:  Positive for dizziness.  All other systems reviewed and are negative.   Updated Vital Signs BP 118/77   Pulse (!) 49   Temp 98 F (36.7 C) (Oral)   Resp 13   Ht 1.651 m (5' 5)   Wt (!) 137.9 kg   SpO2 97%   BMI 50.59 kg/m   Physical Exam Vitals and nursing note reviewed.  Constitutional:      General: He is not in acute distress.    Appearance: He is well-developed.  HENT:     Head: Normocephalic and atraumatic.     Mouth/Throat:     Pharynx: No oropharyngeal exudate.  Eyes:     General: No scleral icterus.       Right eye: No discharge.        Left eye: No discharge.     Conjunctiva/sclera: Conjunctivae normal.     Pupils: Pupils are equal, round, and reactive to light.  Neck:     Thyroid : No thyromegaly.     Vascular: No JVD.  Cardiovascular:     Rate and Rhythm: Regular rhythm. Bradycardia present.     Heart sounds: Normal heart sounds. No murmur heard.    No friction rub. No gallop.     Comments: Heart rate approximately 50 to 55 bpm with normal pulses, occasional PVCs Pulmonary:     Effort: Pulmonary effort is normal. No respiratory distress.     Breath sounds: Normal breath sounds. No wheezing or rales.  Abdominal:     General: Bowel sounds are normal. There is no distension.     Palpations: Abdomen is soft. There is no mass.     Tenderness: There is no abdominal tenderness.  Musculoskeletal:        General: No tenderness. Normal range of motion.     Cervical back: Normal range of motion and neck supple.     Right lower leg: No edema.     Left lower leg: No edema.  Lymphadenopathy:     Cervical: No cervical adenopathy.   Skin:    General: Skin is warm and dry.     Findings: No erythema or rash.  Neurological:     Mental Status: He is alert.     Coordination: Coordination normal.  Psychiatric:        Behavior: Behavior normal.     (all labs ordered are listed, but only abnormal results are displayed) Labs Reviewed  CBC WITH DIFFERENTIAL/PLATELET - Abnormal; Notable for the following components:      Result Value   RBC 4.08 (*)  Platelets 124 (*)    All other components within normal limits  COMPREHENSIVE METABOLIC PANEL WITH GFR - Abnormal; Notable for the following components:   Glucose, Bld 118 (*)    Calcium  8.8 (*)    Alkaline Phosphatase 31 (*)    All other components within normal limits  BLOOD GAS, VENOUS - Abnormal; Notable for the following components:   pO2, Ven 65 (*)    Bicarbonate 29.2 (*)    Acid-Base Excess 3.8 (*)    All other components within normal limits  LACTIC ACID, PLASMA  LACTIC ACID, PLASMA  MAGNESIUM  URINALYSIS, ROUTINE W REFLEX MICROSCOPIC  TROPONIN T, HIGH SENSITIVITY  TROPONIN T, HIGH SENSITIVITY    EKG: EKG Interpretation Date/Time:  Saturday August 30 2024 08:41:48 EST Ventricular Rate:  73 PR Interval:  167 QRS Duration:  98 QT Interval:  421 QTC Calculation: 451 R Axis:   34  Text Interpretation: Sinus rhythm Multiform ventricular premature complexes Sinus pause Low voltage, precordial leads since last tracing no significant change Confirmed by Cleotilde Rogue ([id number]) on 08/30/2024 9:19:14 AM  Radiology: [facility] Chest Port 1 View Result Date: 08/30/2024 CLINICAL DATA:  Shortness of breath EXAM: PORTABLE CHEST 1 VIEW COMPARISON:  08/16/2023, 04/29/2023 . FINDINGS: The lungs are adequately inflated without focal lobar consolidation or effusion. Calcified granuloma over the left upper lobe unchanged. Mild stable cardiomegaly. Remainder of the exam is unchanged. IMPRESSION: 1. No acute cardiopulmonary disease. 2. Mild stable cardiomegaly. Electronically  Signed   By: Toribio Agreste M.D.   On: 08/30/2024 09:58     Procedures   Medications Ordered in the ED  0.9 %  sodium chloride  infusion (0 mLs Intravenous Stopped 08/30/24 1322)  sodium chloride  0.9 % bolus 1,000 mL (1,000 mLs Intravenous New Bag/Given 08/30/24 1110)                                    Medical Decision Making Amount and/or Complexity of Data Reviewed Labs: ordered. Radiology: ordered.  Risk Prescription drug management.    This patient presents to the ED for concern of generalized feeling of weakness and foggy headed, denies active chest pain, this involves an extensive number of treatment options, and is a complaint that carries with it a high risk of complications and morbidity.  The differential diagnosis includes consider abnormal electrolytes, elevated CO2, hypomagnesemia, coronary disease, dehydration, thankfully vital signs are normal at this time   Co morbidities / Chronic conditions that complicate the patient evaluation  Chronic obesity COPD prior history of arrhythmia   Additional history obtained:  Additional history obtained from EMR External records from outside source obtained and reviewed including prior medical record review, the patient is followed in the office by pulmonary, cardiology and his family doctor, no recent admissions to the hospital going back over the last year, Echocardiogram last performed July 2023, this showed ejection fraction of 55 to 60% indeterminate left ventricular parameters Stress test performed September 2023 showing a normal study low risk   Lab Tests:  I Ordered, and personally interpreted labs.  The pertinent results include: CBC metabolic panel lactate troponin all unremarkable   Imaging Studies ordered:  I ordered imaging studies including chest x-ray I independently visualized and interpreted imaging which showed no acute findings I agree with the radiologist interpretation   Cardiac Monitoring: /  EKG:  The patient was maintained on a cardiac monitor.  I personally viewed and interpreted  the cardiac monitored which showed an underlying rhythm of: Occasional PVCs   Problem List / ED Course / Critical interventions / Medication management  This patient has received some IV fluids, has been able to stand and walk, has no significant change in symptoms with standing, states he is always a little bit lightheaded when he stands, his heart rate is never been below 55 when I am in the room, his mentation is totally normal.  It does appear that he is getting metoprolol  succinate twice a day instead of once a day, this may be causing some slight increased bradycardia or hypotension so I will bring it back to once a day.  He has follow-up this week with cardiology both for further evaluation of his pulmonary artery as well as with a cardiologist on Friday, he states he feels comfortable for discharge, his discharge vital signs reflect a blood pressure of 118/77 with a heart rate of 55 while I am in the room, he is mentating appropriately and is very comfortable, he is not hypoxic or short of breath at this time I have reviewed the patients home medicines and have made adjustments as needed   Social Determinants of Health:  None   Test / Admission - Considered:  Considered admission but the patient is doing very well without any increased symptoms and appears stable at the time of discharge  I have discussed with the patient at the bedside the results, and the meaning of these results.  They have had opportunity to ask questions,  expressed their understanding to the need for follow-up with primary care physician      Final diagnoses:  Bradycardia    ED Discharge Orders     None          Cleotilde Rogue, MD 08/30/24 1330

## 2024-08-30 NOTE — Discharge Instructions (Signed)
 Your testing has been reassuring, I think that you are probably getting too much of the metoprolol .  Please take this once a day instead of twice a day and follow-up this week for further testing on your heart as is already scheduled as well as seeing your cardiologist on Friday.  Please return to the ER immediately for severe or worsening symptoms.

## 2024-08-30 NOTE — ED Triage Notes (Signed)
 Pt c/o dizziness, ShOB, intermittent chest pain; states his BP was 89/59 last night on home device; states BP was 110/61 this morning. Pt states he felt bad last night in general, no improvement this morning so he decided to come to ED. A&Ox4.

## 2024-09-01 ENCOUNTER — Ambulatory Visit: Payer: Self-pay | Admitting: Internal Medicine

## 2024-09-01 ENCOUNTER — Ambulatory Visit (HOSPITAL_COMMUNITY)
Admission: RE | Admit: 2024-09-01 | Discharge: 2024-09-01 | Disposition: A | Payer: Self-pay | Source: Ambulatory Visit | Attending: Internal Medicine | Admitting: Internal Medicine

## 2024-09-01 DIAGNOSIS — R0602 Shortness of breath: Secondary | ICD-10-CM

## 2024-09-01 LAB — ECHOCARDIOGRAM COMPLETE
AR max vel: 2.25 cm2
AV Area VTI: 2.24 cm2
AV Area mean vel: 2.19 cm2
AV Mean grad: 6 mmHg
AV Peak grad: 10.4 mmHg
Ao pk vel: 1.61 m/s
Area-P 1/2: 4.89 cm2
S' Lateral: 3.8 cm

## 2024-09-01 NOTE — Progress Notes (Signed)
*  PRELIMINARY RESULTS* Echocardiogram 2D Echocardiogram has been performed.  Thomas Ball 09/01/2024, 11:25 AM

## 2024-09-03 ENCOUNTER — Other Ambulatory Visit: Payer: Self-pay

## 2024-09-03 ENCOUNTER — Telehealth (INDEPENDENT_AMBULATORY_CARE_PROVIDER_SITE_OTHER): Payer: Self-pay

## 2024-09-03 ENCOUNTER — Other Ambulatory Visit (INDEPENDENT_AMBULATORY_CARE_PROVIDER_SITE_OTHER): Payer: Self-pay

## 2024-09-03 DIAGNOSIS — K5909 Other constipation: Secondary | ICD-10-CM

## 2024-09-03 MED ORDER — LINACLOTIDE 290 MCG PO CAPS: 290.0000 ug | ORAL_CAPSULE | Freq: Every day | ORAL | Status: AC

## 2024-09-03 NOTE — Telephone Encounter (Signed)
 Patient came by office needing samples of Linzess  290 mcg. I gave him three boxes of 4 each = 12 capsules.

## 2024-09-03 NOTE — Progress Notes (Deleted)
 Called No answer.

## 2024-09-05 ENCOUNTER — Ambulatory Visit: Payer: Self-pay | Attending: Internal Medicine | Admitting: Internal Medicine

## 2024-09-05 ENCOUNTER — Telehealth: Payer: Self-pay | Admitting: Family Medicine

## 2024-09-05 ENCOUNTER — Encounter: Payer: Self-pay | Admitting: Internal Medicine

## 2024-09-05 DIAGNOSIS — E119 Type 2 diabetes mellitus without complications: Secondary | ICD-10-CM

## 2024-09-05 NOTE — Patient Instructions (Signed)
Medication Instructions:  Your physician recommends that you continue on your current medications as directed. Please refer to the Current Medication list given to you today.   Labwork: None today  Testing/Procedures: None today  Follow-Up: 1 year Dr.Ross   Any Other Special Instructions Will Be Listed Below (If Applicable).  If you need a refill on your cardiac medications before your next appointment, please call your pharmacy.  

## 2024-09-05 NOTE — Progress Notes (Signed)
 HPI [name] returns for followup of palpitations and tachy-brady syndrome. He had SVT in 2015 and underwent ablation. He has had an improvement in his palpitations.  No syncope. He did have fairly significant sinus brady when her wore the heart monitor but is now asymptomatic. 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 He has not been able to lose weight. He is on CPAP. He has had some problems with low bp. He has minimal palpitations despite a lot of atrial ectopy.  Allergies[1]   Current Outpatient Medications  Medication Sig Dispense Refill   atorvastatin  (LIPITOR) 40 MG tablet Take 1 tablet (40 mg total) by mouth daily. 90 tablet 3   doxazosin  (CARDURA ) 2 MG tablet Take 0.5 tablets (1 mg total) by mouth daily. 45 tablet 3   finasteride  (PROSCAR ) 5 MG tablet Take 1 tablet (5 mg total) by mouth daily. 30 tablet 11   flecainide  (TAMBOCOR ) 50 MG tablet TAKE 1&1/2 TABLETS BY MOUTH TWICE DAILY (Patient taking differently: Take 50 mg by mouth 2 (two) times daily.) 270 tablet 1   albuterol  (PROVENTIL ) (2.5 MG/3ML) 0.083% nebulizer solution Take 3 mLs (2.5 mg total) by nebulization every 6 (six) hours as needed for wheezing or shortness of breath. 150 mL 5   albuterol  (VENTOLIN  HFA) 108 (90 Base) MCG/ACT inhaler Inhale 2 puffs into the lungs every 6 (six) hours as needed for wheezing or shortness of breath. 8 g 2   furosemide  (LASIX ) 20 MG tablet TAKE ONE (1) TABLET BY MOUTH EVERY DAY 30 tablet 3   linaclotide  (LINZESS ) 290 MCG CAPS capsule Take 1 capsule (290 mcg total) by mouth daily before breakfast. For chronic constipation Lot #[id number] Exp 10/26     metoprolol  succinate (TOPROL  XL) 25 MG 24 hr tablet Take 1 tablet (25 mg total) by mouth in the morning and at bedtime. 180 tablet 3   nicotine  (NICODERM CQ  - DOSED IN MG/24 HOURS) 14 mg/24hr patch RX #2 Weeks 5-6: 14 mg x 1 patch daily. Wear for 24 hours. If you have sleep disturbances, remove at  bedtime. (Patient not taking: Reported on 08/12/2024) 14 patch 0   nicotine  (NICODERM CQ  - DOSED IN MG/24 HOURS) 21 mg/24hr patch RX #1 Weeks 1-4: 21 mg x 1 patch daily. Wear for 24 hours. If you have sleep disturbances, remove at bedtime. (Patient not taking: Reported on 09/05/2024) 28 patch 0   nicotine  (NICODERM CQ  - DOSED IN MG/24 HR) 7 mg/24hr patch RX #3 Weeks 7-8: 7 mg x 1 patch daily. Wear for 24 hours. If you have sleep disturbances, remove at bedtime. (Patient not taking: Reported on 09/05/2024) 14 patch 0   omeprazole  (PRILOSEC) 40 MG capsule TAKE ONE (1) CAPSULE EACH DAY 90 capsule 3   Semaglutide ,0.25 or 0.5MG /DOS, (OZEMPIC , 0.25 OR 0.5 MG/DOSE,) 2 MG/3ML SOPN Inject 0.5 mg into the skin once a week.     sildenafil  (VIAGRA ) 100 MG tablet Take 0.5-1 tablets (50-100 mg total) by mouth daily as needed for erectile dysfunction. 5 tablet 11   tamsulosin  (FLOMAX ) 0.4 MG CAPS capsule Take 1 capsule (0.4 mg total) by mouth in the morning and at bedtime. 60 capsule 11   umeclidinium-vilanterol (ANORO ELLIPTA ) 62.5-25 MCG/ACT AEPB Inhale 1 puff into the lungs daily. (Patient not taking: Reported on 08/12/2024) 1 each 6   No current facility-administered medications for this visit.     Past Medical History:  Diagnosis Date  GERD (gastroesophageal reflux disease)    HCV (hepatitis C virus) DR. ZACKS   AUG 2013 VIRAL LOAD UNDETECTABLE   History of cardiac catheterization    a. 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   CHOLECYSTECTOMY     COLONOSCOPY  10/04/2012   SLF:ONE/8 SIMPLE ADENOMA, TICS, SML IH    ESOPHAGOGASTRODUODENOSCOPY N/A 09/04/2014   Procedure: ESOPHAGOGASTRODUODENOSCOPY (EGD);  Surgeon: Margo LITTIE Haddock, MD;  Location: [facility];  Service: Endoscopy;  Laterality: N/A;  145   GALLBLADDER SURGERY     JAW BROKEN     LIPOMA REMOVALS     SEVERAL   RIGHT/LEFT HEART CATH AND CORONARY ANGIOGRAPHY N/A 10/08/2018   Procedure: RIGHT/LEFT HEART CATH AND CORONARY ANGIOGRAPHY;  Surgeon: Jordan, Peter M, MD;  [facility] INVASIVE CV LAB;  Service: Cardiovascular;  Laterality: N/A;   SUPRAVENTRICULAR TACHYCARDIA ABLATION N/A 06/22/2014   Procedure: SUPRAVENTRICULAR TACHYCARDIA ABLATION;  Surgeon: Danelle LELON Waddell, MD;  Location: [facility] CATH LAB;  Service: Cardiovascular;  Laterality: N/A;     Family History  Problem Relation Age of Onset   Emphysema Mother    COPD Mother    Early death Mother    Hyperlipidemia Father    Stroke Father    Diabetes Father    High Cholesterol Father    Colon cancer Neg Hx      Social History   Socioeconomic History   Marital status: Widowed    Spouse name: Not on file   Number of children: Not on file   Years of education: Not on file   Highest education level: Not on file  Occupational History   Occupation: concrete work    Associate Professor: SELF-EMPLOYED  Tobacco Use   Smoking status: Every Day    Current packs/day: 2.00    Average packs/day: 2.0 packs/day for 45.0 years (90.0 ttl pk-yrs)    Types: Cigarettes   Smokeless tobacco: Never   Tobacco comments:    smokes 1-1.5 packs per day  Vaping Use   Vaping status: Never Used  Substance and Sexual Activity   Alcohol use: No   Drug use: No    Comment: in the 1980s, smoke crack. None since Dec 2011.    Sexual activity: Not Currently  Other Topics Concern   Not on file  Social History Narrative   Not on file   Social Drivers of Health   Tobacco Use: High Risk (09/05/2024)   Patient History    Smoking Tobacco Use: Every Day    Smokeless Tobacco Use: Never    Passive Exposure: Not on file   Financial Resource Strain: High Risk (06/25/2024)   Overall Financial Resource Strain (CARDIA)    Difficulty of Paying Living Expenses: Hard  Food Insecurity: No Food Insecurity (08/12/2024)   Epic    Worried About Radiation Protection Practitioner of Food in the Last Year: Never true    Ran Out of Food in the Last Year: Never true  Transportation Needs: No Transportation Needs (08/12/2024)   Epic    Lack of Transportation (Medical): No  Lack of Transportation (Non-Medical): No  Physical Activity: Not on file  Stress: Not on file  Social Connections: Not on file  Intimate Partner Violence: Not At Risk (08/12/2024)   Epic    Fear of Current or Ex-Partner: No    Emotionally Abused: No    Physically Abused: No    Sexually Abused: No  Depression (PHQ2-9): Low Risk (08/12/2024)   Depression (PHQ2-9)    PHQ-2 Score: 0  Alcohol Screen: Not on file  Housing: Unknown (08/12/2024)   Epic    Unable to Pay for Housing in the Last Year: No    Number of Times Moved in the Last Year: Not on file    Homeless in the Last Year: No  Utilities: Not At Risk (08/12/2024)   Epic    Threatened with loss of utilities: No  Health Literacy: Not on file     Ht 5' 4 (1.626 m)   Wt (!) 313 lb (142 kg)   BMI 53.73 kg/m   Physical Exam:   morbidly obese NAD HEENT: Unremarkable Neck:  No JVD, no thyromegally Lymphatics:  No adenopathy Back:  No CVA tenderness Lungs:  Clear with no wheezes HEART:  Regular rate rhythm, no murmurs, no rubs, no clicks Abd:  soft, positive bowel sounds, no organomegally, no rebound, no guarding Ext:  2 plus pulses, no edema, no cyanosis, no clubbing Skin:  No rashes no nodules Neuro:  CN II through XII intact, motor grossly intact  Assess/Plan:  Symptomatic tachy-brady - He will continue low dose flecainide . No indication for a ppm. Hopefully the flecainide  will continue to control his symptoms. If not we will increase the dose. Obesity - he is encouraged to lose weight. I stongly  encouraged this.  HTN - his bp is ok today.     Danelle Waddell COME     [1] Not on File

## 2024-09-05 NOTE — Telephone Encounter (Signed)
 Spoke with Thomas Ball and made her aware. She is going to call patient back.

## 2024-09-05 NOTE — Telephone Encounter (Signed)
 Pt needs Samples of Ozempic . He said Mliss give him samples

## 2024-09-08 ENCOUNTER — Encounter: Payer: Self-pay | Admitting: *Deleted

## 2024-09-08 ENCOUNTER — Telehealth: Payer: Self-pay | Admitting: *Deleted

## 2024-09-08 NOTE — Patient Instructions (Signed)
 Thomas Ball - I am sorry I was unable to reach you today for our scheduled appointment. I work with Thomas Norene HERO, Thomas Ball and am calling to support your healthcare needs. Please contact me at [phone] at your earliest convenience. I look forward to speaking with you soon.   Thank you,  Rosina Forte, BSN RN [facility], [facility] RN Care Manager Direct Dial: [phone]  Fax: [phone]

## 2024-09-11 NOTE — Telephone Encounter (Signed)
 Pt called saying he had a shot left but after this week he will out.  Can he get more samples.  [phone]

## 2024-09-16 ENCOUNTER — Encounter (HOSPITAL_COMMUNITY): Payer: Self-pay

## 2024-09-16 ENCOUNTER — Encounter (HOSPITAL_COMMUNITY)
Admission: RE | Admit: 2024-09-16 | Discharge: 2024-09-16 | Disposition: A | Discharge status: Home / Self Care | Payer: Self-pay | Source: Ambulatory Visit | Attending: Internal Medicine | Admitting: Internal Medicine

## 2024-09-16 ENCOUNTER — Other Ambulatory Visit: Payer: Self-pay

## 2024-09-16 ENCOUNTER — Telehealth: Payer: Self-pay | Admitting: *Deleted

## 2024-09-16 ENCOUNTER — Encounter: Payer: Self-pay | Admitting: *Deleted

## 2024-09-16 NOTE — Patient Instructions (Signed)
 Thomas Ball - I have attempted to call you three times but have been unsuccessful in reaching you. I work with Jolinda Norene HERO, DO and am calling to support your healthcare needs. If I can be of assistance to you, please contact me at [phone].   Thank you,  Rosina Forte, BSN RN [facility], [facility] RN Care Manager Direct Dial: [phone]  Fax: [phone]

## 2024-09-16 NOTE — Telephone Encounter (Signed)
 [email or web address]

## 2024-09-16 NOTE — Pre-Procedure Instructions (Signed)
 Patient states he lost his instructions. I printed copy for him and will leave it at Main entrance desk for him to pick up.

## 2024-09-22 ENCOUNTER — Ambulatory Visit (HOSPITAL_COMMUNITY): Payer: Self-pay

## 2024-09-22 ENCOUNTER — Telehealth: Payer: Self-pay

## 2024-09-22 ENCOUNTER — Other Ambulatory Visit: Payer: Self-pay

## 2024-09-22 ENCOUNTER — Encounter (HOSPITAL_COMMUNITY): Payer: Self-pay | Admitting: Internal Medicine

## 2024-09-22 ENCOUNTER — Ambulatory Visit (HOSPITAL_COMMUNITY)
Admission: RE | Admit: 2024-09-22 | Discharge: 2024-09-22 | Disposition: A | Discharge status: Home / Self Care | Payer: Self-pay | Attending: Internal Medicine | Admitting: Internal Medicine

## 2024-09-22 ENCOUNTER — Encounter (HOSPITAL_COMMUNITY)
Admission: RE | Disposition: A | Discharge status: Home / Self Care | Payer: Self-pay | Source: Home / Self Care | Attending: Internal Medicine

## 2024-09-22 DIAGNOSIS — K562 Volvulus: Secondary | ICD-10-CM

## 2024-09-22 DIAGNOSIS — Z13810 Encounter for screening for upper gastrointestinal disorder: Secondary | ICD-10-CM | POA: Insufficient documentation

## 2024-09-22 DIAGNOSIS — D124 Benign neoplasm of descending colon: Secondary | ICD-10-CM

## 2024-09-22 DIAGNOSIS — K219 Gastro-esophageal reflux disease without esophagitis: Secondary | ICD-10-CM | POA: Insufficient documentation

## 2024-09-22 DIAGNOSIS — D175 Benign lipomatous neoplasm of intra-abdominal organs: Secondary | ICD-10-CM

## 2024-09-22 DIAGNOSIS — Z9981 Dependence on supplemental oxygen: Secondary | ICD-10-CM | POA: Insufficient documentation

## 2024-09-22 DIAGNOSIS — K635 Polyp of colon: Secondary | ICD-10-CM

## 2024-09-22 DIAGNOSIS — Z1211 Encounter for screening for malignant neoplasm of colon: Secondary | ICD-10-CM

## 2024-09-22 DIAGNOSIS — Z8619 Personal history of other infectious and parasitic diseases: Secondary | ICD-10-CM | POA: Insufficient documentation

## 2024-09-22 DIAGNOSIS — I10 Essential (primary) hypertension: Secondary | ICD-10-CM

## 2024-09-22 DIAGNOSIS — K7460 Unspecified cirrhosis of liver: Secondary | ICD-10-CM

## 2024-09-22 DIAGNOSIS — D122 Benign neoplasm of ascending colon: Secondary | ICD-10-CM

## 2024-09-22 DIAGNOSIS — F17210 Nicotine dependence, cigarettes, uncomplicated: Secondary | ICD-10-CM | POA: Insufficient documentation

## 2024-09-22 DIAGNOSIS — K766 Portal hypertension: Secondary | ICD-10-CM

## 2024-09-22 DIAGNOSIS — K5730 Diverticulosis of large intestine without perforation or abscess without bleeding: Secondary | ICD-10-CM

## 2024-09-22 DIAGNOSIS — K3189 Other diseases of stomach and duodenum: Secondary | ICD-10-CM

## 2024-09-22 DIAGNOSIS — Z860101 Personal history of adenomatous and serrated colon polyps: Secondary | ICD-10-CM

## 2024-09-22 DIAGNOSIS — K648 Other hemorrhoids: Secondary | ICD-10-CM

## 2024-09-22 DIAGNOSIS — G4733 Obstructive sleep apnea (adult) (pediatric): Secondary | ICD-10-CM | POA: Insufficient documentation

## 2024-09-22 DIAGNOSIS — Q438 Other specified congenital malformations of intestine: Secondary | ICD-10-CM | POA: Insufficient documentation

## 2024-09-22 DIAGNOSIS — J449 Chronic obstructive pulmonary disease, unspecified: Secondary | ICD-10-CM | POA: Insufficient documentation

## 2024-09-22 HISTORY — PX: ESOPHAGOGASTRODUODENOSCOPY: SHX5428

## 2024-09-22 HISTORY — PX: POLYPECTOMY: SHX149

## 2024-09-22 HISTORY — DX: Dependence on supplemental oxygen: Z99.81

## 2024-09-22 HISTORY — DX: Chronic obstructive pulmonary disease, unspecified: J44.9

## 2024-09-22 HISTORY — PX: COLONOSCOPY: SHX5424

## 2024-09-22 LAB — GLUCOSE, CAPILLARY: Glucose-Capillary: 138 mg/dL — ABNORMAL HIGH (ref 70–99)

## 2024-09-22 SURGERY — COLONOSCOPY
Anesthesia: General

## 2024-09-22 MED ORDER — LIDOCAINE 2% (20 MG/ML) 5 ML SYRINGE
INTRAMUSCULAR | Status: DC | PRN
  Administered 2024-09-22: 100 mg via INTRAVENOUS

## 2024-09-22 MED ORDER — LACTATED RINGERS IV SOLN: INTRAVENOUS | Status: DC

## 2024-09-22 MED ORDER — VASOPRESSIN 20 UNIT/ML IV SOLN
INTRAVENOUS | Status: DC | PRN
  Administered 2024-09-22 (×2): 2 [IU] via INTRAVENOUS

## 2024-09-22 MED ORDER — EPHEDRINE SULFATE (PRESSORS) 25 MG/5ML IV SOSY
PREFILLED_SYRINGE | INTRAVENOUS | Status: DC | PRN
  Administered 2024-09-22 (×5): 5 mg via INTRAVENOUS

## 2024-09-22 MED ORDER — STERILE WATER FOR IRRIGATION IR SOLN
Status: DC | PRN
  Administered 2024-09-22: 100 mL

## 2024-09-22 MED ORDER — PROPOFOL 10 MG/ML IV BOLUS
INTRAVENOUS | Status: DC | PRN
  Administered 2024-09-22: 100 mg via INTRAVENOUS
  Administered 2024-09-22: 150 ug/kg/min via INTRAVENOUS
  Administered 2024-09-22: 50 mg via INTRAVENOUS

## 2024-09-22 NOTE — Transfer of Care (Signed)
 Immediate Anesthesia Transfer of Care Note  Patient: Thomas Ball  Procedure(s) Performed: COLONOSCOPY EGD (ESOPHAGOGASTRODUODENOSCOPY) POLYPECTOMY, INTESTINE  Patient Location: Short Stay  Anesthesia Type:MAC  Level of Consciousness: awake, alert , oriented, and patient cooperative  Airway & Oxygen  Therapy: Patient Spontanous Breathing  Post-op Assessment: Report given to RN, Post -op Vital signs reviewed and stable, and Patient moving all extremities X 4  Post vital signs: Reviewed and stable  Last Vitals:  Vitals Value Taken Time  BP 110/84 09/22/24 08:53  Temp 36.6 C 09/22/24 08:53  Pulse 55 09/22/24 08:53  Resp 14 09/22/24 08:53  SpO2 94 % 09/22/24 08:53    Last Pain:  Vitals:   09/22/24 0853  TempSrc: Oral  PainSc: 0-No pain         Complications: No notable events documented.

## 2024-09-22 NOTE — Discharge Instructions (Addendum)
 EGD Discharge instructions Please read the instructions outlined below and refer to this sheet in the next few weeks. These discharge instructions provide you with general information on caring for yourself after you leave the hospital. Your doctor may also give you specific instructions. While your treatment has been planned according to the most current medical practices available, unavoidable complications occasionally occur. If you have any problems or questions after discharge, please call your doctor. ACTIVITY You may resume your regular activity but move at a slower pace for the next 24 hours.  Take frequent rest periods for the next 24 hours.  Walking will help expel (get rid of) the air and reduce the bloated feeling in your abdomen.  No driving for 24 hours (because of the anesthesia (medicine) used during the test).  You may shower.  Do not sign any important legal documents or operate any machinery for 24 hours (because of the anesthesia used during the test).  NUTRITION Drink plenty of fluids.  You may resume your normal diet.  Begin with a light meal and progress to your normal diet.  Avoid alcoholic beverages for 24 hours or as instructed by your caregiver.  MEDICATIONS You may resume your normal medications unless your caregiver tells you otherwise.  WHAT YOU CAN EXPECT TODAY You may experience abdominal discomfort such as a feeling of fullness or gas pains.  FOLLOW-UP Your doctor will discuss the results of your test with you.  SEEK IMMEDIATE MEDICAL ATTENTION IF ANY OF THE FOLLOWING OCCUR: Excessive nausea (feeling sick to your stomach) and/or vomiting.  Severe abdominal pain and distention (swelling).  Trouble swallowing.  Temperature over 101 F (37.8 C).  Rectal bleeding or vomiting of blood.      Colonoscopy Discharge Instructions  Read the instructions outlined below and refer to this sheet in the next few weeks. These discharge instructions provide you  with general information on caring for yourself after you leave the hospital. Your doctor may also give you specific instructions. While your treatment has been planned according to the most current medical practices available, unavoidable complications occasionally occur.   ACTIVITY You may resume your regular activity, but move at a slower pace for the next 24 hours.  Take frequent rest periods for the next 24 hours.  Walking will help get rid of the air and reduce the bloated feeling in your belly (abdomen).  No driving for 24 hours (because of the medicine (anesthesia) used during the test).   Do not sign any important legal documents or operate any machinery for 24 hours (because of the anesthesia used during the test).  NUTRITION Drink plenty of fluids.  You may resume your normal diet as instructed by your doctor.  Begin with a light meal and progress to your normal diet. Heavy or fried foods are harder to digest and may make you feel sick to your stomach (nauseated).  Avoid alcoholic beverages for 24 hours or as instructed.  MEDICATIONS You may resume your normal medications unless your doctor tells you otherwise.  WHAT YOU CAN EXPECT TODAY Some feelings of bloating in the abdomen.  Passage of more gas than usual.  Spotting of blood in your stool or on the toilet paper.  IF YOU HAD POLYPS REMOVED DURING THE COLONOSCOPY: No aspirin  products for 7 days or as instructed.  No alcohol for 7 days or as instructed.  Eat a soft diet for the next 24 hours.  FINDING OUT THE RESULTS OF YOUR TEST Not all test results  are available during your visit. If your test results are not back during the visit, make an appointment with your caregiver to find out the results. Do not assume everything is normal if you have not heard from your caregiver or the medical facility. It is important for you to follow up on all of your test results.  SEEK IMMEDIATE MEDICAL ATTENTION IF: You have more than a  spotting of blood in your stool.  Your belly is swollen (abdominal distention).  You are nauseated or vomiting.  You have a temperature over 101.  You have abdominal pain or discomfort that is severe or gets worse throughout the day.   Your upper endoscopy did not reveal any evidence of esophageal varices.  Will repeat this in 3 years.  Your colonoscopy revealed 8 polyp(s) which I removed successfully. Await pathology results, my office will contact you. I recommend repeating colonoscopy in 3 years for surveillance purposes, depending on pathology results.  You also have diverticulosis and internal hemorrhoids. I would recommend increasing fiber in your diet or adding OTC Benefiber/Metamucil. Be sure to drink at least 4 to 6 glasses of water  daily.   Follow-up with GI in 3 months   I hope you have a great rest of your week!  Carlin POUR. Cindie, D.O. Gastroenterology and Hepatology [place] [facility]

## 2024-09-22 NOTE — Op Note (Signed)
 [facility] Patient Name: Thomas Ball Procedure Date: 09/22/2024 8:11 AM MRN: [id number] Date of Birth: 09-Nov-1960 Attending MD: Carlin POUR. Cindie , [place], [id number] CSN: [id number] Age: 63 Admit Type: Outpatient Procedure:                Colonoscopy Indications:              Surveillance: Personal history of adenomatous                            polyps on last colonoscopy > 5 years ago Providers:                Carlin POUR. Cindie, DO, Devere Lodge, Kristine L.                            Shirlean Balm, Technician Referring MD:              Medicines:                See the Anesthesia note for documentation of the                            administered medications Complications:            No immediate complications. Estimated Blood Loss:     Estimated blood loss was minimal. Procedure:                Pre-Anesthesia Assessment:                           - The anesthesia plan was to use monitored                            anesthesia care (MAC).                           After obtaining informed consent, the colonoscope                            was passed under direct vision. Throughout the                            procedure, the patient's blood pressure, pulse, and                            oxygen  saturations were monitored continuously. The                            [id number]-HQ190L ([id number]) Peds Colon was introduced                            through the anus and advanced to the the cecum,                            identified by appendiceal orifice and ileocecal                            valve. The colonoscopy  was technically difficult                            and complex due to a redundant colon and                            significant looping. Successful completion of the                            procedure was aided by applying abdominal pressure.                            The patient tolerated the procedure well. The                            quality of the bowel  preparation was evaluated                            using the BBPS [facility] Bowel Preparation Scale)                            with scores of: Right Colon = 2 (minor amount of                            residual staining, small fragments of stool and/or                            opaque liquid, but mucosa seen well), Transverse                            Colon = 2 (minor amount of residual staining, small                            fragments of stool and/or opaque liquid, but mucosa                            seen well) and Left Colon = 2 (minor amount of                            residual staining, small fragments of stool and/or                            opaque liquid, but mucosa seen well). The total                            BBPS score equals 6. The quality of the bowel                            preparation was good. Scope In: 8:14:08 AM Scope Out: 8:45:28 AM Scope Withdrawal Time: 0 hours 26 minutes 38 seconds  Total Procedure Duration: 0 hours 31 minutes 20 seconds  Findings:      Non-bleeding internal hemorrhoids were found.  Multiple small-mouthed diverticula were found in the sigmoid colon.      Two sessile polyps were found in the ascending colon. The polyps were 8       to 12 mm in size. These polyps were removed with a cold snare. Resection       and retrieval were complete.      Two sessile polyps were found in the descending colon and transverse       colon. The polyps were 3 to 5 mm in size. These polyps were removed with       a cold snare. Resection complete, 1 polyp not retrieved.      There was a small lipoma, in the transverse colon.      Four sessile polyps were found in the sigmoid colon. The polyps were 3       to 4 mm in size. These polyps were removed with a cold snare. Resection       and retrieval were complete.      A moderate amount of semi-liquid stool was found in the entire colon,       making visualization difficult. Lavage of the area was  performed using       copious amounts of sterile water , resulting in clearance with good       visualization. Impression:               - Non-bleeding internal hemorrhoids.                           - Diverticulosis in the sigmoid colon.                           - Two 8 to 12 mm polyps in the ascending colon,                            removed with a cold snare. Resected and retrieved.                           - Two 3 to 5 mm polyps in the descending colon and                            in the transverse colon, removed with a cold snare.                            Resected and retrieved.                           - Small lipoma in the transverse colon.                           - Four 3 to 4 mm polyps in the sigmoid colon,                            removed with a cold snare. Resected and retrieved.                           - Stool in the entire examined colon. Moderate Sedation:  Per Anesthesia Care Recommendation:           - Patient has a contact number available for                            emergencies. The signs and symptoms of potential                            delayed complications were discussed with the                            patient. Return to normal activities tomorrow.                            Written discharge instructions were provided to the                            patient.                           - Resume previous diet.                           - Continue present medications.                           - Await pathology results.                           - Repeat colonoscopy in 3 years for surveillance                            with extended prep                           - Return to GI clinic in 3 months. Procedure Code(s):        --- Professional ---                           [id number], Colonoscopy, flexible; with removal of                            tumor(s), polyp(s), or other lesion(s) by snare                            technique Diagnosis  Code(s):        --- Professional ---                           Z86.010, Personal history of colonic polyps                           K64.8, Other hemorrhoids                           D12.2, Benign neoplasm of ascending colon  D12.4, Benign neoplasm of descending colon                           D12.3, Benign neoplasm of transverse colon (hepatic                            flexure or splenic flexure)                           D12.5, Benign neoplasm of sigmoid colon                           D17.5, Benign lipomatous neoplasm of                            intra-abdominal organs                           K57.30, Diverticulosis of large intestine without                            perforation or abscess without bleeding CPT copyright 2022 American Medical Association. All rights reserved. The codes documented in this report are preliminary and upon coder review may  be revised to meet current compliance requirements. Carlin POUR. Cindie, DO Carlin POUR. Cindie, DO 09/22/2024 8:52:53 AM This report has been signed electronically. Number of Addenda: 0

## 2024-09-22 NOTE — Anesthesia Postprocedure Evaluation (Signed)
"   Anesthesia Post Note  Patient: Thomas Ball  Procedure(s) Performed: COLONOSCOPY EGD (ESOPHAGOGASTRODUODENOSCOPY) POLYPECTOMY, INTESTINE  Patient location during evaluation: PACU Anesthesia Type: General Level of consciousness: awake and alert Pain management: pain level controlled Vital Signs Assessment: post-procedure vital signs reviewed and stable Respiratory status: spontaneous breathing, nonlabored ventilation, respiratory function stable and patient connected to nasal cannula oxygen  Cardiovascular status: blood pressure returned to baseline and stable Postop Assessment: no apparent nausea or vomiting Anesthetic complications: no   No notable events documented.   Last Vitals:  Vitals:   09/22/24 0708 09/22/24 0853  BP: 127/68 110/84  Pulse: (!) 54 (!) 55  Resp: 10 14  Temp: 36.6 C 36.6 C  SpO2: 94% 94%    Last Pain:  Vitals:   09/22/24 0853  TempSrc: Oral  PainSc: 0-No pain                 Andrea Limes      "

## 2024-09-22 NOTE — Progress Notes (Signed)
 Complex Care Management Care Guide Note  09/22/2024 Name: JANET DECESARE MRN: [id number] DOB: 12-Jun-1961  VANN OKERLUND is a 63 y.o. year old male who is a primary care patient of Jolinda Norene HERO, DO and is actively engaged with the care management team. I reached out to Ryan LELON Ege by phone today to assist with re-scheduling  with the Pharmacist.  Follow up plan: Unsuccessful telephone outreach attempt made. A HIPAA compliant phone message was left for the patient providing contact information and requesting a return call.  Jeoffrey Buffalo , RMA     [facility]  [facility] Guide  Direct Dial: [phone]  Website: [email or web address]

## 2024-09-22 NOTE — Anesthesia Preprocedure Evaluation (Addendum)
"                                    Anesthesia Evaluation  Patient identified by MRN, date of birth, ID band Patient awake    Reviewed: Allergy & Precautions, H&P , NPO status , Patient's Chart, lab work & pertinent test results  Airway Mallampati: IV  TM Distance: >3 FB Neck ROM: Full    Dental  (+) Edentulous Lower, Edentulous Upper   Pulmonary sleep apnea and Oxygen  sleep apnea , COPD, Current Smoker Severe OSA   Pulmonary exam normal breath sounds clear to auscultation       Cardiovascular hypertension, Normal cardiovascular exam Rhythm:Regular Rate:Normal  Echo 2025 EF 60-65%; severe atrial dilitation EKG 12/25 PVC's; no significant change from priors   Neuro/Psych   Anxiety     negative neurological ROS  negative psych ROS   GI/Hepatic ,GERD  ,,(+) Cirrhosis       , Hepatitis -  Endo/Other  diabetes  Class 4 obesity  Renal/GU negative Renal ROS  negative genitourinary   Musculoskeletal negative musculoskeletal ROS (+)    Abdominal   Peds negative pediatric ROS (+)  Hematology negative hematology ROS (+)   Anesthesia Other Findings Went to hospital a few weeks ago due to low bp; saw cardiologist who was not concerned  Patient took 1/2 suboxone this am  Reproductive/Obstetrics negative OB ROS                              Anesthesia Physical Anesthesia Plan  ASA: 4  Anesthesia Plan: General   Post-op Pain Management:    Induction: Intravenous  PONV Risk Score and Plan:   Airway Management Planned: Nasal Cannula  Additional Equipment:   Intra-op Plan:   Post-operative Plan:   Informed Consent: I have reviewed the patients History and Physical, chart, labs and discussed the procedure including the risks, benefits and alternatives for the proposed anesthesia with the patient or authorized representative who has indicated his/her understanding and acceptance.     Dental advisory given  Plan  Discussed with: CRNA  Anesthesia Plan Comments:          Anesthesia Quick Evaluation  "

## 2024-09-22 NOTE — H&P (Signed)
 Primary Care Physician:  Jolinda Norene HERO, DO Primary Gastroenterologist:  Dr. Cindie  Pre-Procedure History & Physical: HPI:  Thomas Ball is a 63 y.o. male is here  for an EGD for cirrhosis, variceal screening and a colonoscopy to be performed for surveillance purposes, personal history of adenomatous colon polyps   Past Medical History:  Diagnosis Date   COPD (chronic obstructive pulmonary disease) (HCC)    GERD (gastroesophageal reflux disease)    HCV (hepatitis C virus) DR. ZACKS   AUG 2013 VIRAL LOAD UNDETECTABLE   History of cardiac catheterization    a. 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   CHOLECYSTECTOMY     COLONOSCOPY  10/04/2012   SLF:ONE/8 SIMPLE ADENOMA, TICS, SML IH   ESOPHAGOGASTRODUODENOSCOPY N/A 09/04/2014   Procedure: ESOPHAGOGASTRODUODENOSCOPY (EGD);  Surgeon: Margo LITTIE Haddock, MD;  Location: [facility];  Service: Endoscopy;  Laterality: N/A;  145   GALLBLADDER SURGERY     JAW BROKEN     LIPOMA REMOVALS     SEVERAL   RIGHT/LEFT HEART CATH AND CORONARY ANGIOGRAPHY N/A 10/08/2018   Procedure: RIGHT/LEFT HEART CATH AND CORONARY ANGIOGRAPHY;  Surgeon: Jordan, Peter M, MD;  Location: [facility] INVASIVE CV LAB;  Service: Cardiovascular;  Laterality: N/A;   SUPRAVENTRICULAR TACHYCARDIA ABLATION N/A 06/22/2014   Procedure: SUPRAVENTRICULAR TACHYCARDIA ABLATION;  Surgeon: Danelle LELON Waddell, MD;  Location: [facility] CATH LAB;  Service: Cardiovascular;  Laterality: N/A;    Prior to Admission medications  Medication Sig Start Date End Date Taking? Authorizing Provider  albuterol  (PROVENTIL ) (2.5 MG/3ML) 0.083% nebulizer solution Take 3 mLs (2.5 mg total) by  nebulization every 6 (six) hours as needed for wheezing or shortness of breath. 12/11/23  Yes Parrett, Tammy S, NP  albuterol  (VENTOLIN  HFA) 108 (90 Base) MCG/ACT inhaler Inhale 2 puffs into the lungs every 6 (six) hours as needed for wheezing or shortness of breath. 08/09/23  Yes Milian, Marry Lenis, FNP  aspirin  EC 81 MG tablet Take 81 mg by mouth daily. Swallow whole.   Yes [provider]  atorvastatin  (LIPITOR) 40 MG tablet Take 1 tablet (40 mg total) by mouth daily. 01/21/24  Yes Jolinda Norene M, DO  Buprenorphine HCl-Naloxone HCl 8-2 MG FILM Place under the tongue 3 (three) times daily. 07/29/24  Yes [provider]  doxazosin  (CARDURA ) 2 MG tablet Take 0.5 tablets (1 mg total) by mouth daily. 01/21/24  Yes Jolinda Norene M, DO  finasteride  (PROSCAR ) 5 MG tablet Take 1 tablet (5 mg total) by mouth daily. 07/01/24  Yes Dahlstedt, Garnette, MD  flecainide  (TAMBOCOR ) 50 MG tablet Take 50 mg by mouth 2 (two) times daily.   Yes [provider]  furosemide  (LASIX ) 20 MG tablet TAKE ONE (1) TABLET BY MOUTH EVERY DAY 03/10/24  Yes Jolinda Norene M, DO  linaclotide  (LINZESS ) 290 MCG CAPS capsule Take 1 capsule (290 mcg total) by mouth daily before breakfast. For chronic constipation Lot #[id number] Exp 10/26 09/03/24  Yes Cindie Carlin POUR, DO  metoprolol  succinate (TOPROL  XL) 25 MG 24 hr tablet Take 1 tablet (25 mg total) by  mouth in the morning and at bedtime. 06/17/24  Yes Gottschalk, Ashly M, DO  omeprazole  (PRILOSEC) 40 MG capsule TAKE ONE (1) CAPSULE EACH DAY 02/22/24  Yes Gottschalk, Ashly M, DO  tamsulosin  (FLOMAX ) 0.4 MG CAPS capsule Take 1 capsule (0.4 mg total) by mouth in the morning and at bedtime. 06/16/24  Yes McKenzie, Belvie CROME, MD  nicotine  (NICODERM CQ  - DOSED IN MG/24 HOURS) 14 mg/24hr patch RX #2 Weeks 5-6: 14 mg x 1 patch daily. Wear for 24 hours. If you have sleep disturbances, remove at bedtime. Patient not taking: Reported on 09/05/2024 07/25/24    Alghanim, Fahid, MD  nicotine  (NICODERM CQ  - DOSED IN MG/24 HOURS) 21 mg/24hr patch RX #1 Weeks 1-4: 21 mg x 1 patch daily. Wear for 24 hours. If you have sleep disturbances, remove at bedtime. Patient not taking: Reported on 09/05/2024 07/25/24   Alghanim, Paula, MD  nicotine  (NICODERM CQ  - DOSED IN MG/24 HR) 7 mg/24hr patch RX #3 Weeks 7-8: 7 mg x 1 patch daily. Wear for 24 hours. If you have sleep disturbances, remove at bedtime. Patient not taking: Reported on 09/05/2024 07/25/24   Catherine Paula, MD  Semaglutide ,0.25 or 0.5MG /DOS, (OZEMPIC , 0.25 OR 0.5 MG/DOSE,) 2 MG/3ML SOPN Inject 0.5 mg into the skin once a week. 06/17/24   Jolinda Norene HERO, DO  sildenafil  (VIAGRA ) 100 MG tablet Take 0.5-1 tablets (50-100 mg total) by mouth daily as needed for erectile dysfunction. 06/25/23   Jolinda Norene HERO, DO  umeclidinium-vilanterol (ANORO ELLIPTA ) 62.5-25 MCG/ACT AEPB Inhale 1 puff into the lungs daily. Patient not taking: Reported on 09/05/2024 07/25/24   Catherine Paula, MD    Allergies as of 07/21/2024   (No Known Allergies)    Family History  Problem Relation Age of Onset   Emphysema Mother    COPD Mother    Early death Mother    Hyperlipidemia Father    Stroke Father    Diabetes Father    High Cholesterol Father    Colon cancer Neg Hx     Social History   Socioeconomic History   Marital status: Widowed    Spouse name: Not on file   Number of children: Not on file   Years of education: Not on file   Highest education level: Not on file  Occupational History   Occupation: concrete work    Associate Professor: SELF-EMPLOYED  Tobacco Use   Smoking status: Every Day    Current packs/day: 2.00    Average packs/day: 2.0 packs/day for 45.0 years (90.0 ttl pk-yrs)    Types: Cigarettes   Smokeless tobacco: Never   Tobacco comments:    smokes 1-1.5 packs per day  Vaping Use   Vaping status: Never Used  Substance and Sexual Activity   Alcohol use: No   Drug use: No    Comment: in  the 1980s, smoke crack. None since Dec 2011.    Sexual activity: Not Currently  Other Topics Concern   Not on file  Social History Narrative   Not on file   Social Drivers of Health   Tobacco Use: High Risk (09/22/2024)   Patient History    Smoking Tobacco Use: Every Day    Smokeless Tobacco Use: Never    Passive Exposure: Not on file  Financial Resource Strain: High Risk (06/25/2024)   Overall Financial Resource Strain (CARDIA)    Difficulty of Paying Living Expenses: Hard  Food Insecurity: No Food Insecurity (08/12/2024)   Epic    Worried About Running Out  of Food in the Last Year: Never true    Ran Out of Food in the Last Year: Never true  Transportation Needs: No Transportation Needs (08/12/2024)   Epic    Lack of Transportation (Medical): No    Lack of Transportation (Non-Medical): No  Physical Activity: Not on file  Stress: Not on file  Social Connections: Not on file  Intimate Partner Violence: Not At Risk (08/12/2024)   Epic    Fear of Current or Ex-Partner: No    Emotionally Abused: No    Physically Abused: No    Sexually Abused: No  Depression (PHQ2-9): Low Risk (08/12/2024)   Depression (PHQ2-9)    PHQ-2 Score: 0  Alcohol Screen: Not on file  Housing: Unknown (08/12/2024)   Epic    Unable to Pay for Housing in the Last Year: No    Number of Times Moved in the Last Year: Not on file    Homeless in the Last Year: No  Utilities: Not At Risk (08/12/2024)   Epic    Threatened with loss of utilities: No  Health Literacy: Not on file    Review of Systems: See HPI, otherwise negative ROS  Physical Exam: Vital signs in last 24 hours: Temp:  [97.9 F (36.6 C)] 97.9 F (36.6 C) (12/29 0708) Pulse Rate:  [54] 54 (12/29 0708) Resp:  [10] 10 (12/29 0708) BP: (127)/(68) 127/68 (12/29 0708) SpO2:  [94 %] 94 % (12/29 0708) Weight:  [142 kg] 142 kg (12/29 0708)   General:   Alert,  Well-developed, well-nourished, pleasant and cooperative in NAD Head:   Normocephalic and atraumatic. Eyes:  Sclera clear, no icterus.   Conjunctiva pink. Ears:  Normal auditory acuity. Nose:  No deformity, discharge,  or lesions. Msk:  Symmetrical without gross deformities. Normal posture. Extremities:  Without clubbing or edema. Neurologic:  Alert and  oriented x4;  grossly normal neurologically. Skin:  Intact without significant lesions or rashes. Psych:  Alert and cooperative. Normal mood and affect.  Impression/Plan: Thomas Ball is here for an EGD for cirrhosis, variceal screening and a colonoscopy to be performed for surveillance purposes, personal history of adenomatous colon polyps   The risks of the procedure including infection, bleed, or perforation as well as benefits, limitations, alternatives and imponderables have been reviewed with the patient. Questions have been answered. All parties agreeable.

## 2024-09-22 NOTE — Op Note (Signed)
 [facility] Patient Name: Thomas Ball Procedure Date: 09/22/2024 7:30 AM MRN: [id number] Date of Birth: [birth year] Attending MD: Carlin POUR. Cindie , [place], [id number] CSN: [id number] Age: 63 Admit Type: Outpatient Procedure:                Upper GI endoscopy Indications:              Cirrhosis rule out esophageal varices Providers:                Carlin POUR. Cindie, DO, Devere Lodge, Kristine L.                            Shirlean Balm, Technician Referring MD:              Medicines:                See the Anesthesia note for documentation of the                            administered medications Complications:            No immediate complications. Estimated Blood Loss:     Estimated blood loss: none. Procedure:                Pre-Anesthesia Assessment:                           - The anesthesia plan was to use monitored                            anesthesia care (MAC).                           After obtaining informed consent, the endoscope was                            passed under direct vision. Throughout the                            procedure, the patient's blood pressure, pulse, and                            oxygen  saturations were monitored continuously. The                            [id number] ([id number]) Upper was introduced through                            the mouth, and advanced to the second part of                            duodenum. The upper GI endoscopy was accomplished                            without difficulty. The patient tolerated the                            procedure well. Scope  In: 8:07:23 AM Scope Out: 8:09:23 AM Total Procedure Duration: 0 hours 2 minutes 0 seconds  Findings:      There is no endoscopic evidence of varices in the entire esophagus.      Mild portal hypertensive gastropathy was found in the gastric body.      The duodenal bulb, first portion of the duodenum and second portion of       the duodenum were normal. Impression:                - Portal hypertensive gastropathy.                           - Normal duodenal bulb, first portion of the                            duodenum and second portion of the duodenum.                           - No specimens collected. Moderate Sedation:      Per Anesthesia Care Recommendation:           - Patient has a contact number available for                            emergencies. The signs and symptoms of potential                            delayed complications were discussed with the                            patient. Return to normal activities tomorrow.                            Written discharge instructions were provided to the                            patient.                           - Resume previous diet.                           - Continue present medications.                           - Repeat upper endoscopy in 3 years for screening                            purposes.                           - Return to GI clinic in 3 months.                           - Consider switching metoprolol  to carvedilol to  reduce risk of liver decompensation. Would need to                            discuss with cardiology given his history of                            tachy-brady Procedure Code(s):        --- Professional ---                           [phone], Esophagogastroduodenoscopy, flexible,                            transoral; diagnostic, including collection of                            specimen(s) by brushing or washing, when performed                            (separate procedure) Diagnosis Code(s):        --- Professional ---                           K76.6, Portal hypertension                           K31.89, Other diseases of stomach and duodenum                           K74.60, Unspecified cirrhosis of liver CPT copyright 2022 American Medical Association. All rights reserved. The codes documented in this report are preliminary  and upon coder review may  be revised to meet current compliance requirements. Carlin POUR. Cindie, DO Carlin POUR. Cindie, DO 09/22/2024 8:13:02 AM This report has been signed electronically. Number of Addenda: 0

## 2024-09-23 ENCOUNTER — Encounter (HOSPITAL_COMMUNITY): Payer: Self-pay | Admitting: Internal Medicine

## 2024-09-23 LAB — SURGICAL PATHOLOGY

## 2024-09-23 NOTE — Telephone Encounter (Signed)
 Patient contacted our office,  I let him know that Thomas Ball recommends that he go back on Metformin  unless he can afford cash price for Ozempic .  Patient verbalized understanding. Patient states he needs a prescription for metformin  sent to his pharmacy.

## 2024-09-24 MED ORDER — METFORMIN HCL ER 500 MG PO TB24: 500.0000 mg | ORAL_TABLET | Freq: Every day | ORAL | 1 refills | Status: AC

## 2024-09-24 NOTE — Telephone Encounter (Signed)
 Left detailed message medication has been sent in.

## 2024-09-24 NOTE — Addendum Note (Signed)
 Addended by: JOLINDA NORENE HERO on: 09/24/2024 07:13 AM   Modules accepted: Orders

## 2024-09-24 NOTE — Telephone Encounter (Signed)
 done

## 2024-10-03 ENCOUNTER — Other Ambulatory Visit (HOSPITAL_COMMUNITY): Payer: Self-pay

## 2024-10-08 ENCOUNTER — Other Ambulatory Visit (HOSPITAL_COMMUNITY): Payer: Self-pay

## 2024-10-10 ENCOUNTER — Telehealth: Payer: Self-pay

## 2024-10-10 NOTE — Telephone Encounter (Signed)
 Received refill request form from [employer].   In process of completing [employer] refills for patients OZEMPIC  medication.  [employer] now has an online refill form. I've had this sent to your email for completion (its a blank form from [employer] PAP Support).  He is currently still enrolled until 03/12/25 (uninsured and has no Medicare MBI at the moment).

## 2024-10-29 ENCOUNTER — Telehealth (INDEPENDENT_AMBULATORY_CARE_PROVIDER_SITE_OTHER): Payer: Self-pay

## 2024-10-29 NOTE — Telephone Encounter (Signed)
 I have called and left a Vm, asked that the patient, please return call to the office.

## 2024-10-29 NOTE — Telephone Encounter (Signed)
 Patient called today for his results from recent TCS done by Dr. Cindie on 09/22/2024.   I spoke with the patient and made him aware that Dr. Cindie was currently out of the office, but I did go over the Pathology with him and made him aware that there were several tubular adenoma polpys that were removed and negative for dysplasia and carcinoma, that this was not cancerous,but if it had been left they could have turned into cancer, I made him aware these were removed.   I also made him aware that hyperplastic polyps were removed and this was not cancerous, but it was also removed. I made him aware, that Dr. Cindie had not yet read this as he was out due to a death in his family,but I would make Dr. Cindie aware the patient called and see if he had anything to add to this. Patient states understanding.  He mentioned that he has been having issues with constipation and has noticed when he wipes he has some bright red blood on the tissue, and at times there is nothing. He says the blood is not in his stools,but occasionally after straining to have a bm, there is a tinge of blood on the tissue. He denies any dark stools and has preformed a stool test checking for blood in his stools, which patient says was negative. Per his Colonoscopy report the patient does have internal hemorrhoids, and patient states he takes Linzess  290 mcg prn and has to take a equate brand laxative daily to have a bm. Patient is schedule to see Charmaine Melia NP on 12/22/2024 at 1:30.   Charmaine, would you mind please reviewing all of the information I have given to the patient and give recommendations for the issues of constipation and seeing bright red blood on tissue?  Thanks,  CLS

## 2024-10-29 NOTE — Telephone Encounter (Signed)
 Patient states he does not take the Linzess  as directed as it is too expensive to take daily. Please advise, if there are any different suggestions.   Thanks,

## 2024-10-30 NOTE — Telephone Encounter (Signed)
 I called and left a message, I asked that the patient please return call to the office.

## 2024-10-30 NOTE — Telephone Encounter (Signed)
 I spoke with the patient and made him aware,  Per Charmaine Melia NP, For now I recommend a stool softener once daily and MiraLAX 1 capful 1-2 times a day.   We can consider a different prescription alternative such as Amitiza, Trulance, or Ibsrela whichever would be more affordable and on formulary for him.  If he is interested I can try to send in Amitiza and we can go from there. Patient made aware of all and states will try the stool softener daily and add Miralax 1 capful 1-2 times per day, and if this does not help he will call us  back and let us  know whether to send in an alternative. He is aware  he can use over-the-counter hemorrhoid cream and Preparation H if he is having any concerns of swelling or pain.   Me to Melia Charmaine CROME, NP     10/29/24  3:27 PM Note Patient states he does not take the Linzess  as directed as it is too expensive to take daily. Please advise, if there are any different suggestions.    Thanks,    Melia Charmaine CROME, NP to Me      10/29/24  3:04 PM Given his symptoms I do suspect that this bleeding is secondary to hemorrhoids.    If he has not already doing so he can use over-the-counter hemorrhoid cream and Preparation H if he is having any concerns of swelling or pain.  Sometimes they can also help strengthen hemorrhoids to reduce irritation and bleeding.  Ideally he needs to focus on controlling his constipation and avoiding straining.   Linzess  is meant to take on a regular basis to have more consistent bowel movements.  For the first 2 weeks of therapy on any of the doses diarrhea can occur and usually improves after that time period.  I would recommend that he start taking the Linzess  290 mcg more frequently then just as needed.  If he is taking Linzess  290 mcg daily and still struggling with constipation he can add MiraLAX 17g (1 capful) daily to the regimen or start by adding stool softener with the linzess  and then if no improvement add Miralax.    Patient  strive for fiber intake of 25-35 g a day whether that be in the foods that he eats or with adding supplementation like Metamucil.

## 2024-12-05 ENCOUNTER — Ambulatory Visit: Payer: Self-pay | Admitting: Internal Medicine

## 2024-12-22 ENCOUNTER — Ambulatory Visit: Payer: Self-pay | Admitting: Gastroenterology

## 2025-01-07 ENCOUNTER — Ambulatory Visit: Payer: Self-pay | Admitting: Urology

## 2025-01-16 ENCOUNTER — Encounter: Payer: Self-pay | Admitting: Family Medicine
